# Patient Record
Sex: Male | Born: 1945 | Race: White | Hispanic: No | Marital: Married | Smoking: Former smoker
Health system: Southern US, Community
[De-identification: ages and names within clinical notes are randomized; demographics above are authoritative.]

## PROBLEM LIST (undated history)

## (undated) DIAGNOSIS — K5090 Crohn's disease, unspecified, without complications: Secondary | ICD-10-CM

## (undated) DIAGNOSIS — C61 Malignant neoplasm of prostate: Secondary | ICD-10-CM

## (undated) DIAGNOSIS — E119 Type 2 diabetes mellitus without complications: Secondary | ICD-10-CM

## (undated) DIAGNOSIS — F329 Major depressive disorder, single episode, unspecified: Secondary | ICD-10-CM

## (undated) DIAGNOSIS — H469 Unspecified optic neuritis: Secondary | ICD-10-CM

## (undated) DIAGNOSIS — I10 Essential (primary) hypertension: Secondary | ICD-10-CM

## (undated) DIAGNOSIS — K520 Gastroenteritis and colitis due to radiation: Secondary | ICD-10-CM

## (undated) DIAGNOSIS — F419 Anxiety disorder, unspecified: Secondary | ICD-10-CM

## (undated) DIAGNOSIS — I2510 Atherosclerotic heart disease of native coronary artery without angina pectoris: Secondary | ICD-10-CM

## (undated) DIAGNOSIS — F32A Depression, unspecified: Secondary | ICD-10-CM

## (undated) DIAGNOSIS — G5600 Carpal tunnel syndrome, unspecified upper limb: Secondary | ICD-10-CM

## (undated) DIAGNOSIS — E785 Hyperlipidemia, unspecified: Secondary | ICD-10-CM

## (undated) DIAGNOSIS — N200 Calculus of kidney: Secondary | ICD-10-CM

## (undated) HISTORY — DX: Crohn's disease, unspecified, without complications: K50.90

## (undated) HISTORY — DX: Depression, unspecified: F32.A

## (undated) HISTORY — DX: Unspecified optic neuritis: H46.9

## (undated) HISTORY — DX: Calculus of kidney: N20.0

## (undated) HISTORY — DX: Essential (primary) hypertension: I10

## (undated) HISTORY — DX: Atherosclerotic heart disease of native coronary artery without angina pectoris: I25.10

## (undated) HISTORY — DX: Malignant neoplasm of prostate: C61

## (undated) HISTORY — DX: Carpal tunnel syndrome, unspecified upper limb: G56.00

## (undated) HISTORY — DX: Anxiety disorder, unspecified: F41.9

## (undated) HISTORY — DX: Type 2 diabetes mellitus without complications: E11.9

## (undated) HISTORY — DX: Gastroenteritis and colitis due to radiation: K52.0

## (undated) HISTORY — DX: Hyperlipidemia, unspecified: E78.5

## (undated) HISTORY — PX: KNEE CARTILAGE SURGERY: SHX688

## (undated) HISTORY — PX: NASAL SINUS SURGERY: SHX719

## (undated) HISTORY — PX: COLONOSCOPY: SHX174

## (undated) HISTORY — DX: Major depressive disorder, single episode, unspecified: F32.9

## (undated) HISTORY — PX: PROSTATE SURGERY: SHX751

---

## 1999-12-05 ENCOUNTER — Encounter: Admission: RE | Admit: 1999-12-05 | Discharge: 2000-03-04 | Payer: Self-pay | Admitting: Family Medicine

## 1999-12-27 ENCOUNTER — Encounter: Payer: Self-pay | Admitting: Urology

## 1999-12-27 ENCOUNTER — Ambulatory Visit (HOSPITAL_BASED_OUTPATIENT_CLINIC_OR_DEPARTMENT_OTHER): Admission: RE | Admit: 1999-12-27 | Discharge: 1999-12-27 | Payer: Self-pay | Admitting: Urology

## 2000-05-23 ENCOUNTER — Other Ambulatory Visit: Admission: RE | Admit: 2000-05-23 | Discharge: 2000-05-23 | Payer: Self-pay | Admitting: Internal Medicine

## 2000-05-23 ENCOUNTER — Encounter (INDEPENDENT_AMBULATORY_CARE_PROVIDER_SITE_OTHER): Payer: Self-pay | Admitting: Specialist

## 2002-11-24 ENCOUNTER — Encounter: Admission: RE | Admit: 2002-11-24 | Discharge: 2003-02-22 | Payer: Self-pay | Admitting: Family Medicine

## 2005-03-12 ENCOUNTER — Ambulatory Visit: Payer: Self-pay | Admitting: Internal Medicine

## 2006-03-08 ENCOUNTER — Ambulatory Visit: Payer: Self-pay | Admitting: Internal Medicine

## 2007-03-10 ENCOUNTER — Ambulatory Visit: Payer: Self-pay | Admitting: Internal Medicine

## 2007-04-23 ENCOUNTER — Ambulatory Visit: Payer: Self-pay | Admitting: Internal Medicine

## 2007-05-01 ENCOUNTER — Ambulatory Visit: Payer: Self-pay | Admitting: Internal Medicine

## 2007-06-02 DIAGNOSIS — F419 Anxiety disorder, unspecified: Secondary | ICD-10-CM | POA: Insufficient documentation

## 2007-06-02 DIAGNOSIS — K5730 Diverticulosis of large intestine without perforation or abscess without bleeding: Secondary | ICD-10-CM | POA: Insufficient documentation

## 2007-06-02 DIAGNOSIS — E782 Mixed hyperlipidemia: Secondary | ICD-10-CM | POA: Insufficient documentation

## 2007-06-02 DIAGNOSIS — K5120 Ulcerative (chronic) proctitis without complications: Secondary | ICD-10-CM | POA: Insufficient documentation

## 2007-06-02 DIAGNOSIS — E119 Type 2 diabetes mellitus without complications: Secondary | ICD-10-CM | POA: Insufficient documentation

## 2007-06-02 DIAGNOSIS — I10 Essential (primary) hypertension: Secondary | ICD-10-CM | POA: Insufficient documentation

## 2007-06-02 DIAGNOSIS — K219 Gastro-esophageal reflux disease without esophagitis: Secondary | ICD-10-CM | POA: Insufficient documentation

## 2007-06-02 DIAGNOSIS — K5090 Crohn's disease, unspecified, without complications: Secondary | ICD-10-CM | POA: Insufficient documentation

## 2007-06-02 DIAGNOSIS — F341 Dysthymic disorder: Secondary | ICD-10-CM | POA: Insufficient documentation

## 2007-06-02 DIAGNOSIS — E7800 Pure hypercholesterolemia, unspecified: Secondary | ICD-10-CM

## 2008-04-07 ENCOUNTER — Encounter: Payer: Self-pay | Admitting: Internal Medicine

## 2008-08-05 ENCOUNTER — Encounter (INDEPENDENT_AMBULATORY_CARE_PROVIDER_SITE_OTHER): Payer: Self-pay | Admitting: *Deleted

## 2008-08-09 ENCOUNTER — Ambulatory Visit: Payer: Self-pay | Admitting: Internal Medicine

## 2008-08-09 DIAGNOSIS — K589 Irritable bowel syndrome without diarrhea: Secondary | ICD-10-CM | POA: Insufficient documentation

## 2008-08-09 DIAGNOSIS — R159 Full incontinence of feces: Secondary | ICD-10-CM | POA: Insufficient documentation

## 2008-08-19 ENCOUNTER — Encounter: Payer: Self-pay | Admitting: Internal Medicine

## 2008-09-14 ENCOUNTER — Telehealth: Payer: Self-pay | Admitting: Internal Medicine

## 2008-09-25 ENCOUNTER — Inpatient Hospital Stay (HOSPITAL_COMMUNITY): Admission: EM | Admit: 2008-09-25 | Discharge: 2008-10-01 | Payer: Self-pay | Admitting: Emergency Medicine

## 2008-09-27 ENCOUNTER — Ambulatory Visit: Payer: Self-pay | Admitting: Physical Medicine & Rehabilitation

## 2008-10-01 ENCOUNTER — Inpatient Hospital Stay (HOSPITAL_COMMUNITY)
Admission: RE | Admit: 2008-10-01 | Discharge: 2008-10-22 | Payer: Self-pay | Admitting: Physical Medicine & Rehabilitation

## 2008-10-11 ENCOUNTER — Ambulatory Visit: Payer: Self-pay | Admitting: Psychology

## 2008-10-13 ENCOUNTER — Ambulatory Visit: Payer: Self-pay | Admitting: Physical Medicine & Rehabilitation

## 2008-12-20 ENCOUNTER — Emergency Department (HOSPITAL_COMMUNITY): Admission: EM | Admit: 2008-12-20 | Discharge: 2008-12-20 | Payer: Self-pay | Admitting: Emergency Medicine

## 2009-01-18 ENCOUNTER — Telehealth: Payer: Self-pay | Admitting: Internal Medicine

## 2009-01-20 ENCOUNTER — Telehealth: Payer: Self-pay | Admitting: Internal Medicine

## 2009-01-21 ENCOUNTER — Encounter: Payer: Self-pay | Admitting: Internal Medicine

## 2009-02-15 ENCOUNTER — Telehealth: Payer: Self-pay | Admitting: Internal Medicine

## 2009-05-06 ENCOUNTER — Encounter: Payer: Self-pay | Admitting: Internal Medicine

## 2009-12-08 ENCOUNTER — Encounter: Payer: Self-pay | Admitting: Internal Medicine

## 2010-02-13 ENCOUNTER — Telehealth: Payer: Self-pay | Admitting: Internal Medicine

## 2010-02-14 ENCOUNTER — Encounter: Payer: Self-pay | Admitting: Internal Medicine

## 2010-02-17 ENCOUNTER — Telehealth: Payer: Self-pay | Admitting: Internal Medicine

## 2010-05-25 NOTE — Medication Information (Signed)
Summary: Pt request to change back to [facility]  Pt request to change back to [facility]   Imported By: Sherian Rein 05/10/2009 10:19:47  _____________________________________________________________________  External Attachment:    Type:   Image     Comment:   External Document

## 2010-05-25 NOTE — Progress Notes (Signed)
Summary: meds   Phone Note Call from Patient Call back at Home Phone [phone]   Caller: wife, [name] Call For: Dr. Henrene Pastor Reason for Call: Talk to Nurse Summary of Call: update that pt is inhospitaland wife wanted Pamala Hurry wanted to know so that she knew he wasnt going to be coming by to get meds Initial call taken by: Lucien Mons,  February 17, 2010 3:56 PM  Follow-up for Phone Call        Called wife  and she states he will be in hospital for 8 weeks.  I told her that I would hold on to the samples for her to pick up when she gets a chance.   Follow-up by: Randye Lobo NCMA,  February 17, 2010 4:25 PM

## 2010-05-25 NOTE — Progress Notes (Signed)
Summary: med ?'s   Phone Note Call from Patient Call back at [facility] Phone [phone]   Caller: Patient Call For: Dr. Marina Goodell Reason for Call: Talk to Nurse Summary of Call: has ?'s regarding Asacol Initial call taken by: Vallarie Mare,  February 13, 2010 1:55 PM  Follow-up for Phone Call        wants samples of Asacol HD.  Advised his wife I would leave samples at front desk.  [facility] Cage [facility]  February 14, 2010 8:12 AM

## 2010-05-25 NOTE — Miscellaneous (Signed)
Summary: samples of Asacol  Clinical Lists Changes  Medications: Added new medication of ASACOL HD 800 MG TBEC (MESALAMINE)

## 2010-05-25 NOTE — Medication Information (Signed)
Summary: Prior Authorization for [facility]  Prior Authorization for [facility]   Imported By: Lennie Odor 12/27/2009 15:29:30  _____________________________________________________________________  External Attachment:    Type:   Image     Comment:   External Document

## 2010-06-13 ENCOUNTER — Ambulatory Visit: Payer: Self-pay | Admitting: Urology

## 2010-06-13 ENCOUNTER — Ambulatory Visit (INDEPENDENT_AMBULATORY_CARE_PROVIDER_SITE_OTHER): Payer: Medicare Other | Admitting: Urology

## 2010-06-13 DIAGNOSIS — C61 Malignant neoplasm of prostate: Secondary | ICD-10-CM

## 2010-06-13 DIAGNOSIS — N529 Male erectile dysfunction, unspecified: Secondary | ICD-10-CM

## 2010-07-29 LAB — URINALYSIS, ROUTINE W REFLEX MICROSCOPIC
Bilirubin Urine: NEGATIVE
Hgb urine dipstick: NEGATIVE
Nitrite: NEGATIVE
Protein, ur: NEGATIVE mg/dL
Urobilinogen, UA: 0.2 mg/dL (ref 0.0–1.0)

## 2010-07-29 LAB — APTT: aPTT: 27 seconds (ref 24–37)

## 2010-07-29 LAB — DIFFERENTIAL
Basophils Relative: 1 % (ref 0–1)
Eosinophils Absolute: 0.4 10*3/uL (ref 0.0–0.7)
Lymphs Abs: 1.1 10*3/uL (ref 0.7–4.0)
Monocytes Relative: 6 % (ref 3–12)
Neutro Abs: 5 10*3/uL (ref 1.7–7.7)
Neutrophils Relative %: 72 % (ref 43–77)

## 2010-07-29 LAB — BASIC METABOLIC PANEL
BUN: 15 mg/dL (ref 6–23)
Calcium: 9.3 mg/dL (ref 8.4–10.5)
Chloride: 105 mEq/L (ref 96–112)
Creatinine, Ser: 1.05 mg/dL (ref 0.4–1.5)
GFR calc Af Amer: >60 mL/min (ref >60)

## 2010-07-29 LAB — PROTIME-INR
INR: 1 (ref 0.00–1.49)
Prothrombin Time: 12.8 seconds (ref 11.6–15.2)

## 2010-07-29 LAB — CBC
MCV: 92.4 fL (ref 78.0–100.0)
Platelets: 181 10*3/uL (ref 150–400)
RBC: 3.38 MIL/uL — ABNORMAL LOW (ref 4.22–5.81)
WBC: 6.9 10*3/uL (ref 4.0–10.5)

## 2010-07-30 LAB — GLUCOSE, CAPILLARY
Glucose-Capillary: 106 mg/dL — ABNORMAL HIGH (ref 70–99)
Glucose-Capillary: 114 mg/dL — ABNORMAL HIGH (ref 70–99)
Glucose-Capillary: 98 mg/dL (ref 70–99)

## 2010-07-31 LAB — GLUCOSE, CAPILLARY
Glucose-Capillary: 101 mg/dL — ABNORMAL HIGH (ref 70–99)
Glucose-Capillary: 107 mg/dL — ABNORMAL HIGH (ref 70–99)
Glucose-Capillary: 108 mg/dL — ABNORMAL HIGH (ref 70–99)
Glucose-Capillary: 108 mg/dL — ABNORMAL HIGH (ref 70–99)
Glucose-Capillary: 109 mg/dL — ABNORMAL HIGH (ref 70–99)
Glucose-Capillary: 110 mg/dL — ABNORMAL HIGH (ref 70–99)
Glucose-Capillary: 112 mg/dL — ABNORMAL HIGH (ref 70–99)
Glucose-Capillary: 112 mg/dL — ABNORMAL HIGH (ref 70–99)
Glucose-Capillary: 113 mg/dL — ABNORMAL HIGH (ref 70–99)
Glucose-Capillary: 113 mg/dL — ABNORMAL HIGH (ref 70–99)
Glucose-Capillary: 116 mg/dL — ABNORMAL HIGH (ref 70–99)
Glucose-Capillary: 120 mg/dL — ABNORMAL HIGH (ref 70–99)
Glucose-Capillary: 121 mg/dL — ABNORMAL HIGH (ref 70–99)
Glucose-Capillary: 122 mg/dL — ABNORMAL HIGH (ref 70–99)
Glucose-Capillary: 122 mg/dL — ABNORMAL HIGH (ref 70–99)
Glucose-Capillary: 123 mg/dL — ABNORMAL HIGH (ref 70–99)
Glucose-Capillary: 131 mg/dL — ABNORMAL HIGH (ref 70–99)
Glucose-Capillary: 131 mg/dL — ABNORMAL HIGH (ref 70–99)
Glucose-Capillary: 152 mg/dL — ABNORMAL HIGH (ref 70–99)
Glucose-Capillary: 155 mg/dL — ABNORMAL HIGH (ref 70–99)
Glucose-Capillary: 171 mg/dL — ABNORMAL HIGH (ref 70–99)
Glucose-Capillary: 92 mg/dL (ref 70–99)
Glucose-Capillary: 94 mg/dL (ref 70–99)
Glucose-Capillary: 97 mg/dL (ref 70–99)
Glucose-Capillary: 98 mg/dL (ref 70–99)
Glucose-Capillary: 99 mg/dL (ref 70–99)
Glucose-Capillary: 107 mg/dL — ABNORMAL HIGH (ref 70–99)
Glucose-Capillary: 108 mg/dL — ABNORMAL HIGH (ref 70–99)
Glucose-Capillary: 111 mg/dL — ABNORMAL HIGH (ref 70–99)
Glucose-Capillary: 112 mg/dL — ABNORMAL HIGH (ref 70–99)
Glucose-Capillary: 112 mg/dL — ABNORMAL HIGH (ref 70–99)
Glucose-Capillary: 114 mg/dL — ABNORMAL HIGH (ref 70–99)
Glucose-Capillary: 114 mg/dL — ABNORMAL HIGH (ref 70–99)
Glucose-Capillary: 116 mg/dL — ABNORMAL HIGH (ref 70–99)
Glucose-Capillary: 116 mg/dL — ABNORMAL HIGH (ref 70–99)
Glucose-Capillary: 117 mg/dL — ABNORMAL HIGH (ref 70–99)
Glucose-Capillary: 118 mg/dL — ABNORMAL HIGH (ref 70–99)
Glucose-Capillary: 119 mg/dL — ABNORMAL HIGH (ref 70–99)
Glucose-Capillary: 119 mg/dL — ABNORMAL HIGH (ref 70–99)
Glucose-Capillary: 119 mg/dL — ABNORMAL HIGH (ref 70–99)
Glucose-Capillary: 121 mg/dL — ABNORMAL HIGH (ref 70–99)
Glucose-Capillary: 124 mg/dL — ABNORMAL HIGH (ref 70–99)
Glucose-Capillary: 126 mg/dL — ABNORMAL HIGH (ref 70–99)
Glucose-Capillary: 128 mg/dL — ABNORMAL HIGH (ref 70–99)
Glucose-Capillary: 135 mg/dL — ABNORMAL HIGH (ref 70–99)
Glucose-Capillary: 135 mg/dL — ABNORMAL HIGH (ref 70–99)
Glucose-Capillary: 142 mg/dL — ABNORMAL HIGH (ref 70–99)
Glucose-Capillary: 145 mg/dL — ABNORMAL HIGH (ref 70–99)
Glucose-Capillary: 148 mg/dL — ABNORMAL HIGH (ref 70–99)
Glucose-Capillary: 152 mg/dL — ABNORMAL HIGH (ref 70–99)
Glucose-Capillary: 153 mg/dL — ABNORMAL HIGH (ref 70–99)
Glucose-Capillary: 184 mg/dL — ABNORMAL HIGH (ref 70–99)
Glucose-Capillary: 93 mg/dL (ref 70–99)

## 2010-07-31 LAB — BASIC METABOLIC PANEL
BUN: 25 mg/dL — ABNORMAL HIGH (ref 6–23)
CO2: 29 mEq/L (ref 19–32)
CO2: 31 mEq/L (ref 19–32)
CO2: 26 mEq/L (ref 19–32)
Calcium: 8.3 mg/dL — ABNORMAL LOW (ref 8.4–10.5)
Chloride: 98 mEq/L (ref 96–112)
Chloride: 112 mEq/L (ref 96–112)
Creatinine, Ser: 1.16 mg/dL (ref 0.4–1.5)
Creatinine, Ser: 0.95 mg/dL (ref 0.4–1.5)
Creatinine, Ser: 1.28 mg/dL (ref 0.4–1.5)
GFR calc Af Amer: >60 mL/min (ref >60)
GFR calc Af Amer: >60 mL/min (ref >60)
GFR calc Af Amer: >60 mL/min (ref >60)
GFR calc non Af Amer: 57 mL/min — ABNORMAL LOW (ref >60)
GFR calc non Af Amer: >60 mL/min (ref >60)
Glucose, Bld: 103 mg/dL — ABNORMAL HIGH (ref 70–99)
Glucose, Bld: 115 mg/dL — ABNORMAL HIGH (ref 70–99)
Potassium: 4.3 mEq/L (ref 3.5–5.1)
Potassium: 4.7 mEq/L (ref 3.5–5.1)
Potassium: 4.5 mEq/L (ref 3.5–5.1)
Sodium: 133 mEq/L — ABNORMAL LOW (ref 135–145)
Sodium: 137 mEq/L (ref 135–145)
Sodium: 137 mEq/L (ref 135–145)

## 2010-07-31 LAB — CBC
HCT: 34.6 % — ABNORMAL LOW (ref 39.0–52.0)
HCT: 35.3 % — ABNORMAL LOW (ref 39.0–52.0)
HCT: 20.9 % — ABNORMAL LOW (ref 39.0–52.0)
HCT: 21.9 % — ABNORMAL LOW (ref 39.0–52.0)
HCT: 22.1 % — ABNORMAL LOW (ref 39.0–52.0)
HCT: 25.8 % — ABNORMAL LOW (ref 39.0–52.0)
HCT: 34.1 % — ABNORMAL LOW (ref 39.0–52.0)
HCT: 32 % — ABNORMAL LOW (ref 39.0–52.0)
Hemoglobin: 11.6 g/dL — ABNORMAL LOW (ref 13.0–17.0)
Hemoglobin: 12.1 g/dL — ABNORMAL LOW (ref 13.0–17.0)
Hemoglobin: 7.4 g/dL — CL (ref 13.0–17.0)
Hemoglobin: 7.5 g/dL — CL (ref 13.0–17.0)
Hemoglobin: 8.8 g/dL — ABNORMAL LOW (ref 13.0–17.0)
MCHC: 33.6 g/dL (ref 30.0–36.0)
MCHC: 34.2 g/dL (ref 30.0–36.0)
MCHC: 34.1 g/dL (ref 30.0–36.0)
MCHC: 34.4 g/dL (ref 30.0–36.0)
MCHC: 34.9 g/dL (ref 30.0–36.0)
MCHC: 35.2 g/dL (ref 30.0–36.0)
MCV: 92.7 fL (ref 78.0–100.0)
MCV: 93.5 fL (ref 78.0–100.0)
MCV: 94.3 fL (ref 78.0–100.0)
MCV: 94.6 fL (ref 78.0–100.0)
MCV: 93.7 fL (ref 78.0–100.0)
Platelets: 114 10*3/uL — ABNORMAL LOW (ref 150–400)
Platelets: 143 10*3/uL — ABNORMAL LOW (ref 150–400)
Platelets: 175 10*3/uL (ref 150–400)
Platelets: 262 10*3/uL (ref 150–400)
Platelets: 161 10*3/uL (ref 150–400)
RBC: 3.67 MIL/uL — ABNORMAL LOW (ref 4.22–5.81)
RBC: 2.26 MIL/uL — ABNORMAL LOW (ref 4.22–5.81)
RBC: 2.3 MIL/uL — ABNORMAL LOW (ref 4.22–5.81)
RBC: 2.37 MIL/uL — ABNORMAL LOW (ref 4.22–5.81)
RBC: 2.44 MIL/uL — ABNORMAL LOW (ref 4.22–5.81)
RBC: 2.74 MIL/uL — ABNORMAL LOW (ref 4.22–5.81)
RBC: 3 MIL/uL — ABNORMAL LOW (ref 4.22–5.81)
RDW: 13.9 % (ref 11.5–15.5)
RDW: 14 % (ref 11.5–15.5)
RDW: 14.1 % (ref 11.5–15.5)
RDW: 12.8 % (ref 11.5–15.5)
WBC: 6.9 10*3/uL (ref 4.0–10.5)
WBC: 7 10*3/uL (ref 4.0–10.5)
WBC: 8.6 10*3/uL (ref 4.0–10.5)
WBC: 9.1 10*3/uL (ref 4.0–10.5)
WBC: 13.9 10*3/uL — ABNORMAL HIGH (ref 4.0–10.5)

## 2010-07-31 LAB — POCT CARDIAC MARKERS
CKMB, poc: 1.6 ng/mL (ref 1.0–8.0)
Troponin i, poc: <0.05 ng/mL (ref 0.00–0.09)

## 2010-07-31 LAB — TYPE AND SCREEN
ABO/RH(D): O POS
Antibody Screen: NEGATIVE

## 2010-07-31 LAB — COMPREHENSIVE METABOLIC PANEL
AST: 33 U/L (ref 0–37)
Albumin: 3.4 g/dL — ABNORMAL LOW (ref 3.5–5.2)
Albumin: 3.8 g/dL (ref 3.5–5.2)
BUN: 23 mg/dL (ref 6–23)
BUN: 25 mg/dL — ABNORMAL HIGH (ref 6–23)
Calcium: 9.3 mg/dL (ref 8.4–10.5)
Chloride: 106 mEq/L (ref 96–112)
Creatinine, Ser: 0.89 mg/dL (ref 0.4–1.5)
Creatinine, Ser: 1.14 mg/dL (ref 0.4–1.5)
GFR calc Af Amer: >60 mL/min (ref >60)
Total Bilirubin: 0.5 mg/dL (ref 0.3–1.2)
Total Protein: 7.3 g/dL (ref 6.0–8.3)
Total Protein: 6.7 g/dL (ref 6.0–8.3)

## 2010-07-31 LAB — POCT I-STAT, CHEM 8
Chloride: 108 mEq/L (ref 96–112)
Glucose, Bld: 160 mg/dL — ABNORMAL HIGH (ref 70–99)
HCT: 34 % — ABNORMAL LOW (ref 39.0–52.0)
Hemoglobin: 11.6 g/dL — ABNORMAL LOW (ref 13.0–17.0)
Potassium: 4.3 mEq/L (ref 3.5–5.1)
Sodium: 139 mEq/L (ref 135–145)

## 2010-07-31 LAB — HEMOCCULT GUIAC POC 1CARD (OFFICE): Fecal Occult Bld: NEGATIVE

## 2010-07-31 LAB — CROSSMATCH: ABO/RH(D): O POS

## 2010-07-31 LAB — DIFFERENTIAL
Basophils Absolute: 0 10*3/uL (ref 0.0–0.1)
Eosinophils Relative: 1 % (ref 0–5)
Lymphocytes Relative: 17 % (ref 12–46)
Lymphocytes Relative: 6 % — ABNORMAL LOW (ref 12–46)
Lymphs Abs: 0.9 10*3/uL (ref 0.7–4.0)
Monocytes Absolute: 0.7 10*3/uL (ref 0.1–1.0)
Monocytes Absolute: 0.6 10*3/uL (ref 0.1–1.0)
Monocytes Relative: 8 % (ref 3–12)
Monocytes Relative: 4 % (ref 3–12)
Neutro Abs: 6.3 10*3/uL (ref 1.7–7.7)
Neutro Abs: 12.3 10*3/uL — ABNORMAL HIGH (ref 1.7–7.7)
Neutrophils Relative %: 71 % (ref 43–77)
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
## 2010-09-05 NOTE — Assessment & Plan Note (Signed)
[facility]                         GASTROENTEROLOGY OFFICE NOTE   NAME:[name]                MRN:          [id number]  DATE:03/10/2007                            DOB:          12/25/1945    REASON FOR VISIT:  [name] presents today for followup.  He is a  65 year old gentleman with a history of mild Crohn's colitis with  proctitis, gastroesophageal reflux disease, hypertension, diabetes,  anxiety disorder and radiotherapy for prostate cancer.  For his colitis,  he is maintained on Asacol 1200 mg t.i.d. and mesalamine suppositories  on demand.  For his reflux disease, he has been on omeprazole.  On  omeprazole, his reflux symptoms are well-controlled.  No breakthrough  heartburn or dysphagia.  His chief complaint today is that of  alternating bowel habits.  He reports his bowels alternating between  diarrhea and constipation.  He denies mucus or bleeding.  His weight has  been stable.  There has been occasional nocturnal incontinence.  He  states he is having some short-term memory problems.  He has been  compliant with medical therapy.  He is sees Dr. Wolfgang Phoenix for his general  medical care.  His last colonoscopy was in January 2002.  Followup  colonoscopy letter was sent to his home earlier this year.  He denies  abdominal pain or weight loss.   ALLERGIES:  No known drug allergies.   CURRENT MEDICATIONS:  1. Asacol 1200 mg t.i.d.  2. Paxil 60 mg daily.  3. Simvastatin 80 mg daily.  4. Toprol XL 50 mg daily.  5. Bayer aspirin.  6. Vitamin C.  7. Vitamin E.  8. B12.  9. Metformin 500 mg b.i.d.  10.Xanax 0.5 mg t.i.d.  11.Canasa suppositories 1000 mg at night p.r.n.   PHYSICAL EXAMINATION:  GENERAL:  A well-appearing male in no acute  distress.  VITAL SIGNS:  Blood pressure 122/68, heart rate 60, weight 171.6 pounds  (increased 1.6 pounds).  HEENT:  Sclerae anicteric.  Conjunctivae pink.  Oral mucosa is intact.  There  is no adenopathy.  LUNGS:  Clear.  HEART:  Regular.  ABDOMEN:  Soft without tenderness, mass or hernia.  Good bowel sounds  heard.  EXTREMITIES:  Without edema.   IMPRESSION:  1. Mild Crohn's colitis with proctitis.  2. Alternating bowel habits, question irritable bowel syndrome versus      increased activity of his Crohn's disease.  3. Gastroesophageal reflux disease, stable on omeprazole.   RECOMMENDATIONS:  1. Continue Asacol and mesalamine suppositories.  2. Continue omeprazole.  3. Schedule colonoscopy to assess the status of his colitis.  The      nature of the procedures with risks and alternatives have been      reviewed.  He understood and agreed to proceed.  4. Initiate fiber supplementation in the form of Metamucil in an      effort  to improve bowel habits.     [name]. Paul Pastor, MD  Electronically Signed    JNP/MedQ  DD: 03/10/2007  DT: 03/10/2007  Job #: [id number]   cc:   [name], M.D.
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
## 2010-09-08 NOTE — Assessment & Plan Note (Signed)
[place] OFFICE NOTE   NAME:Leibold, SAMMIE DENNER                MRN:          [id number]  DATE:03/08/2006                            DOB:          [birth year]    HISTORY:  Mr. Agner presents for annual followup.  He requests  medication refill.  He is a 65 year old gentleman (birthday is today) with a  history of mild Crohn's colitis with proctitis.  As well, he has a history  of gastroesophageal reflux disease.  General medical problems include  hypertension, diabetes and anxiety.  He was last evaluated March 12, 2005.  At that time, his gastrointestinal problems were well-managed on  medical therapy.  For his inflammatory bowel disease he takes Asacol 1200 mg  t.i.d. and mesalamine suppositories on demand.  For his reflux disease he  was on Protonix but recently switched to what I suspect is generic  omeprazole by the [facility].  Since his last visit he is doing well.  He  does request a refill of mesalamine suppositories and possibly Asacol as he  is not sure if he can get these medications through the [place].  On his new  proton pump inhibitor he has good control of his reflux symptoms.  He has  been complaining of increased flatus, which he thinks maybe due to change of  medicines, though he is not sure.  He denies abdominal pain, bleeding or  incontinence as previous.   CURRENT MEDICATIONS:  Are as listed:  1. Asacol 1200 mg t.i.d.  2. Imdur 60 mg daily.  3. Toprol XL 75 mg daily.  4. Aspirin.  5. Vitamin C.  6. Vitamin E.  7. Vitamin B12.  8. Lipitor 80 mg daily.  9. Metformin 500 mg b.i.d.  10.Oxytrol patch.  11.Paxil 60 mg daily.  12.Xanax 0.5 mg t.i.d.  13.Mesalamine suppositories.  14.Flomax.   PHYSICAL EXAMINATION:  A well-appearing male in no acute distress.  He is  alert and oriented.  Blood pressure is 120/70, heart rate is 48 and regular.  His weight is 170  pounds.  HEENT:  Sclerae anicteric, conjunctivae are pink, oral mucosa is intact.  ABDOMEN:  Soft, without tenderness, mass or hernia.  Good bowel sounds  heard.   IMPRESSION:  1. Mild Crohn's colitis with proctitis.  Symptoms continue to be managed      with daily Asacol and mesalamine suppositories on demand.  2. Gastroesophageal reflux disease.  Symptoms controlled with current      proton pump inhibitor.  3. General medical problems.   RECOMMENDATIONS:  1. Continue Asacol.  2. Continue mesalamine suppositories.  3. Continue proton pump inhibitor.  4. Ongoing general medical care with Dr. Wolfgang Phoenix.  5. Gastrointestinal follow up in 1 year unless interval questions or      problems.     Docia Chuck. Geri Seminole., MD  Electronically Signed    JNP/MedQ  DD: 03/08/2006  DT: 03/08/2006  Job #: [id number]   cc:   Margaretmary Eddy, M.D.

## 2010-09-08 NOTE — Op Note (Signed)
[place]  Patient:    [name], [name]                MRN: [id number] Proc. Date: 12/27/99 Adm. Date:  [id number] Attending:  Paschal Dopp CC:         Calton Golds, M.D.  Rexene Edison, M.D.   Operative Report  PREOPERATIVE DIAGNOSIS:  Adenocarcinoma of the prostate, clinical stage T1C, Gleason score 6/10 (3+3).  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  OPERATION PERFORMED:  Placement of I-125 seeds within the prostate.  SURGEON:  Lillette Boxer. Diona Fanti, M.D.  ASSISTANT:  Rexene Edison, M.D.  ANESTHESIA:  General.  COMPLICATIONS:  None.  INDICATIONS FOR PROCEDURE:  The patient is a 65 year old male with a history of prostate cancer, diagnosed by Dr. Ranae Plumber in [place].  The patient presented for evaluation of an elevated PSA.  Pathologic review of the specimen has revealed a Gleason score 6/10 in the left lobe.  The patient has chosen to have radiation therapy, and he and Dr. Valere Dross have decided on brachytherapy. He is aware of alternatives as well as the risks of this procedure.  He desires to proceed.  DESCRIPTION OF PROCEDURE:  The patient was administered a general anesthetic. He was placed in a dorsal lithotomy position, genitalia and perineum were prepped and draped after a Foley catheter was placed transurethrally.  The ultrasound probe was placed in the rectum which had been decompressed with a red rubber catheter.  The ultrasound probe was used to visualize the prostate and it was fixed in position to match the previously obtained ultrasonographs. The prostate measured approximately 4 cm in length.  The point of reference was 1 cm from the base.  Using the piont of reference and after all measurements had been taken, a total of 104 seeds were placed using 25 needles.  Fluoroscopy and ultrasonograms were used to guide placement. Placement was  found to be excellent.  There was a string of seeds on the right which were felt to be dislodged into the perineum/ischiorectal fossa which was a fatty area.  We could not recover these.  Following placement of all of these seeds, cystoscopy was performed.  No seeds were seen loose in the bladder or urethra.  At this point the catheter was replaced and the patient extubated and taken to PACU in stable condition. DD:  12/27/99 TD:  12/28/99 Job: [id number]

## 2011-05-01 ENCOUNTER — Ambulatory Visit (INDEPENDENT_AMBULATORY_CARE_PROVIDER_SITE_OTHER): Payer: Medicare Other | Admitting: Urology

## 2011-05-01 DIAGNOSIS — N320 Bladder-neck obstruction: Secondary | ICD-10-CM

## 2011-05-01 DIAGNOSIS — N529 Male erectile dysfunction, unspecified: Secondary | ICD-10-CM

## 2011-05-01 DIAGNOSIS — C61 Malignant neoplasm of prostate: Secondary | ICD-10-CM

## 2011-08-07 ENCOUNTER — Ambulatory Visit (INDEPENDENT_AMBULATORY_CARE_PROVIDER_SITE_OTHER): Payer: Medicare Other | Admitting: Urology

## 2011-08-07 DIAGNOSIS — C61 Malignant neoplasm of prostate: Secondary | ICD-10-CM

## 2011-12-06 DIAGNOSIS — H47019 Ischemic optic neuropathy, unspecified eye: Secondary | ICD-10-CM | POA: Insufficient documentation

## 2011-12-06 DIAGNOSIS — H4720 Unspecified optic atrophy: Secondary | ICD-10-CM | POA: Insufficient documentation

## 2011-12-06 DIAGNOSIS — H47013 Ischemic optic neuropathy, bilateral: Secondary | ICD-10-CM | POA: Insufficient documentation

## 2011-12-06 DIAGNOSIS — H469 Unspecified optic neuritis: Secondary | ICD-10-CM | POA: Insufficient documentation

## 2011-12-06 DIAGNOSIS — H53439 Sector or arcuate defects, unspecified eye: Secondary | ICD-10-CM | POA: Insufficient documentation

## 2012-07-23 ENCOUNTER — Encounter: Payer: Self-pay | Admitting: *Deleted

## 2012-07-28 ENCOUNTER — Ambulatory Visit: Payer: Medicare Other | Admitting: Family Medicine

## 2012-08-05 ENCOUNTER — Ambulatory Visit: Payer: Medicare Other | Admitting: Urology

## 2012-08-05 ENCOUNTER — Ambulatory Visit: Payer: Medicare Other | Admitting: Family Medicine

## 2012-09-02 ENCOUNTER — Ambulatory Visit (INDEPENDENT_AMBULATORY_CARE_PROVIDER_SITE_OTHER): Payer: Medicare Other | Admitting: Urology

## 2012-09-02 DIAGNOSIS — C61 Malignant neoplasm of prostate: Secondary | ICD-10-CM

## 2012-09-17 ENCOUNTER — Encounter: Payer: Self-pay | Admitting: Family Medicine

## 2012-09-17 ENCOUNTER — Ambulatory Visit (INDEPENDENT_AMBULATORY_CARE_PROVIDER_SITE_OTHER): Payer: Medicare Other | Admitting: Family Medicine

## 2012-09-17 VITALS — BP 126/74 | Temp 98.5°F | Wt 165.0 lb

## 2012-09-17 DIAGNOSIS — R109 Unspecified abdominal pain: Secondary | ICD-10-CM

## 2012-09-17 DIAGNOSIS — E785 Hyperlipidemia, unspecified: Secondary | ICD-10-CM

## 2012-09-17 DIAGNOSIS — K5730 Diverticulosis of large intestine without perforation or abscess without bleeding: Secondary | ICD-10-CM

## 2012-09-17 DIAGNOSIS — I10 Essential (primary) hypertension: Secondary | ICD-10-CM

## 2012-09-17 NOTE — Progress Notes (Signed)
  Subjective:    Patient ID: [name], male    DOB: 1945/06/30, 67 y.o.   MRN: [id number]  Abdominal Pain This is a new problem. The current episode started in the past 7 days. The onset quality is gradual. The problem occurs 2 to 4 times per day. The pain is located in the epigastric region. The pain is moderate. The abdominal pain radiates to the epigastric region. Nothing aggravates the pain. The pain is relieved by nothing. He has tried nothing for the symptoms. The treatment provided mild relief.   No one else has been sick at home. Early on vomiting was much more frequent. Now it's more loose stools. Patient has had mid epigastric discomfort. Often nausea. At times notes his reflux is been acting up in recent months.  Of note, the patient did not followup. Is requested for his primary care. He and his wife today now states that his primary care doctor is the [facility]. They plan to use [place] only for short term issues such as this.  Review of Systems  Gastrointestinal: Positive for abdominal pain.      ROS otherwise negative. Objective:   Physical Exam Alert no acute distress somewhat anxious. Vitals reviewed. Afebrile. Lungs clear. Heart regular rate and rhythm. Epigastrium mild to moderate tenderness. No rebound no guarding bowel sounds diffusely hyperactive.       Assessment & Plan:  Impression #1 acute gastroenteritis. #2 flare of reflux. #3 probable gastritis. Plan increase omeprazole to 40 mg daily. Carafate 1 g a.c. and at bedtime. Zofran 4 mg ODT when necessary for nausea. Expect gradual resolution. Followup with [place] or his primary care doctor at the [place] if persists. Easily 25 minutes spent most in discussion. WSL

## 2012-09-17 NOTE — Patient Instructions (Signed)
Try to get new dose of omeprazole approved by your primary care doctor at the [facility]

## 2012-09-24 ENCOUNTER — Telehealth: Payer: Self-pay | Admitting: Family Medicine

## 2012-09-24 NOTE — Telephone Encounter (Signed)
Patient says that he has applied for a back brace and wants to give you the heads up for when the paper is faxed over from the company and to please approve it for him. Thanks.

## 2012-09-29 ENCOUNTER — Telehealth: Payer: Self-pay | Admitting: Family Medicine

## 2012-09-29 NOTE — Telephone Encounter (Signed)
Patient Prior Auth for Ondansetron has been approved and a letter will be sent out soon. Any questions call [phone]

## 2012-10-21 LAB — PULMONARY FUNCTION TEST

## 2013-03-03 ENCOUNTER — Telehealth: Payer: Self-pay | Admitting: Family Medicine

## 2013-03-03 NOTE — Telephone Encounter (Signed)
Wants to know if Dr. Richardson Landry approved a back brace and diabetic supplies for patient.  States he does not remember whom was sending our office the forms.

## 2013-03-04 NOTE — Telephone Encounter (Signed)
Don't recall getting forms

## 2013-03-04 NOTE — Telephone Encounter (Signed)
Patient advised to have forms resent for evaluation.

## 2013-03-18 ENCOUNTER — Ambulatory Visit (INDEPENDENT_AMBULATORY_CARE_PROVIDER_SITE_OTHER): Payer: Medicare Other | Admitting: Cardiovascular Disease

## 2013-03-18 ENCOUNTER — Encounter: Payer: Self-pay | Admitting: Cardiovascular Disease

## 2013-03-18 VITALS — BP 102/60 | HR 69 | Ht 71.0 in | Wt 167.7 lb

## 2013-03-18 DIAGNOSIS — I2510 Atherosclerotic heart disease of native coronary artery without angina pectoris: Secondary | ICD-10-CM

## 2013-03-18 DIAGNOSIS — E119 Type 2 diabetes mellitus without complications: Secondary | ICD-10-CM

## 2013-03-18 DIAGNOSIS — E785 Hyperlipidemia, unspecified: Secondary | ICD-10-CM

## 2013-03-18 DIAGNOSIS — I10 Essential (primary) hypertension: Secondary | ICD-10-CM

## 2013-03-18 MED ORDER — NITROGLYCERIN 0.4 MG SL SUBL: 0.4000 mg | SUBLINGUAL_TABLET | SUBLINGUAL | Status: DC | PRN

## 2013-03-18 NOTE — Patient Instructions (Signed)
Start taking the hydrochlorothiazide only three days a week (Monday, Wednesday and Friday), 12.5 mg each time. Monitor your BP for 2-3 weeks. If your blood pressure stays under 140/90, you can stop hydrochlorothiazide altogether.  Your physician recommends that you schedule a follow-up appointment in: 12 months

## 2013-03-21 DIAGNOSIS — I2510 Atherosclerotic heart disease of native coronary artery without angina pectoris: Secondary | ICD-10-CM | POA: Insufficient documentation

## 2013-03-21 NOTE — Progress Notes (Signed)
Patient ID: [name], male   DOB: [birth year], 67 y.o.   MRN: [id number]     Reason for office visit CAD   Allergies  Allergen Reactions  . Flomax [Tamsulosin Hcl]     High dose   . Morphine And Related     Current Outpatient Prescriptions  Medication Sig Dispense Refill  . albuterol (PROVENTIL HFA;VENTOLIN HFA) 108 (90 BASE) MCG/ACT inhaler Inhale 2 puffs into the lungs every 6 (six) hours as needed for wheezing.      [name] ascorbic acid (VITAMIN C) 500 MG tablet Take 500 mg by mouth 2 (two) times daily.      [name] aspirin 325 MG EC tablet Take 325 mg by mouth daily.      . clotrimazole (LOTRIMIN) 1 % cream Apply 1 application topically 2 (two) times daily.      [name] docusate sodium (COLACE) 100 MG capsule Take 100 mg by mouth 2 (two) times daily.      . flunisolide (NASAREL) 29 MCG/ACT (0.025%) nasal spray Place into the nose. 1 spray in each nostril at bedtime      . hydrochlorothiazide (HYDRODIURIL) 25 MG tablet Take 25 mg by mouth. 1/2 tablet daily      . lisinopril (PRINIVIL,ZESTRIL) 20 MG tablet Take 20 mg by mouth. 1/2 tablet daily      . mesalamine (ASACOL) 400 MG EC tablet Take 400 mg by mouth 3 (three) times daily.       . metoprolol tartrate (LOPRESSOR) 25 MG tablet Take by mouth. Take 1/2 tablet BID      . nitroGLYCERIN (NITROSTAT) 0.4 MG SL tablet Place 1 tablet (0.4 mg total) under the tongue every 5 (five) minutes as needed.  25 tablet  12  . omeprazole (PRILOSEC) 20 MG capsule Take 20 mg by mouth daily.      [name] PARoxetine (PAXIL) 40 MG tablet Take 40 mg by mouth. Take 1 1/2 tablets at bedtime      . QUEtiapine (SEROQUEL XR) 50 MG TB24 Take 50 mg by mouth. Take 1/2 tablet at bedtime      . rOPINIRole (REQUIP) 1 MG tablet Take 1 mg by mouth at bedtime.      . simvastatin (ZOCOR) 80 MG tablet Take by mouth at bedtime. One half tablet      . traZODone (DESYREL) 50 MG tablet Take by mouth at bedtime. 2 tablets      . vitamin E 400 UNIT capsule Take 400 Units by mouth  daily.       No current facility-administered medications for this visit.    Past Medical History  Diagnosis Date  . Anxiety   . Hyperlipidemia   . Depression   . Hypertension   . Diabetes mellitus without complication     Type 2  . Kidney stone   . CAD (coronary artery disease)   . CTS (carpal tunnel syndrome)     Mild  . Adenocarcinoma of prostate   . Crohn's disease   . Optic neuritis     Past Surgical History  Procedure Laterality Date  . Knee cartilage surgery Right   . Colonoscopy    . Nasal sinus surgery    . Prostate surgery      Family History  Problem Relation Age of Onset  . Cancer Mother     Colon  . Hypertension Mother   . Heart attack Father   . Coronary artery disease Father   . Hypertension Sister   . Crohn's disease  Brother     History   Social History  . Marital Status: Married    Spouse Name: N/A    Number of Children: N/A  . Years of Education: N/A   Occupational History  . Not on file.   Social History Main Topics  . Smoking status: Current Every Day Smoker  . Smokeless tobacco: Not on file     Comment: havent been able to smoke since Monday 09/15/12  . Alcohol Use: Not on file  . Drug Use: Not on file  . Sexual Activity: Not on file   Other Topics Concern  . Not on file   Social History Narrative  . No narrative on file    Review of systems: The patient specifically denies any chest pain at rest or with exertion, dyspnea at rest or with exertion, orthopnea, paroxysmal nocturnal dyspnea, syncope, palpitations, focal neurological deficits, intermittent claudication, lower extremity edema, unexplained weight gain, cough, hemoptysis or wheezing.  The patient also denies abdominal pain, nausea, vomiting, dysphagia, diarrhea, constipation, polyuria, polydipsia, dysuria, hematuria, frequency, urgency, abnormal bleeding or bruising, fever, chills, unexpected weight changes, mood swings, change in skin or hair texture, change in voice  quality, auditory or visual problems, allergic reactions or rashes, new musculoskeletal complaints other than usual "aches and pains".   PHYSICAL EXAM BP 102/60  Pulse 69  Ht 5' 11"  (1.803 m)  Wt 167 lb 11.2 oz (76.068 kg)  BMI 23.40 kg/m2  General: Alert, oriented x3, no distress Head: no evidence of trauma, PERRL, EOMI, no exophtalmos or lid lag, no myxedema, no xanthelasma; normal ears, nose and oropharynx Neck: normal jugular venous pulsations and no hepatojugular reflux; brisk carotid pulses without delay and no carotid bruits Chest: clear to auscultation, no signs of consolidation by percussion or palpation, normal fremitus, symmetrical and full respiratory excursions Cardiovascular: normal position and quality of the apical impulse, regular rhythm, normal first and second heart sounds, no murmurs, rubs or gallops Abdomen: no tenderness or distention, no masses by palpation, no abnormal pulsatility or arterial bruits, normal bowel sounds, no hepatosplenomegaly Extremities: no clubbing, cyanosis or edema; 2+ radial, ulnar and brachial pulses bilaterally; 2+ right femoral, posterior tibial and dorsalis pedis pulses; 2+ left femoral, posterior tibial and dorsalis pedis pulses; no subclavian or femoral bruits Neurological: grossly nonfocal   EKG: NSR, 1st degree AV block   ASSESSMENT AND PLAN CAD s/p PCI LAD  Asymptomatic, although activity is limited by back problems, which limits functional assessment. Focus is on risk factors.  DIABETES MELLITUS Reports good control, A1c around 6%  HYPERLIPIDEMIA On statin therapy, reports labs by dr. Wolfgang Phoenix showed good results  HYPERTENSION BP is low and he has symptoms of orthostatic hypotension. Will try to wean off diuretic altogether, avoiding the negative metabolic effects.  Patient Instructions  Start taking the hydrochlorothiazide only three days a week (Monday, Wednesday and Friday), 12.5 mg each time. Monitor your BP for 2-3  weeks. If your blood pressure stays under 140/90, you can stop hydrochlorothiazide altogether.  Your physician recommends that you schedule a follow-up appointment in: 12 months    Orders Placed This Encounter  Procedures  . EKG 12-Lead   Meds ordered this encounter  Medications  . rOPINIRole (REQUIP) 1 MG tablet    Sig: Take 1 mg by mouth at bedtime.  [name] DISCONTD: nitroGLYCERIN (NITROSTAT) 0.4 MG SL tablet    Sig: Place 1 tablet (0.4 mg total) under the tongue every 5 (five) minutes as needed.    Dispense:  25 tablet    Refill:  12  . nitroGLYCERIN (NITROSTAT) 0.4 MG SL tablet    Sig: Place 1 tablet (0.4 mg total) under the tongue every 5 (five) minutes as needed.    Dispense:  25 tablet    Refill:  [address] [place], [facility] [phone] office [phone] pager

## 2013-03-21 NOTE — Assessment & Plan Note (Signed)
BP is low and he has symptoms of orthostatic hypotension. Will try to wean off diuretic altogether, avoiding the negative metabolic effects.

## 2013-03-21 NOTE — Assessment & Plan Note (Signed)
Reports good control, A1c around 6%

## 2013-03-21 NOTE — Assessment & Plan Note (Signed)
On statin therapy, reports labs by dr. Wolfgang Phoenix showed good results

## 2013-03-21 NOTE — Assessment & Plan Note (Signed)
Asymptomatic, although activity is limited by back problems, which limits functional assessment. Focus is on risk factors.

## 2013-03-23 ENCOUNTER — Encounter: Payer: Self-pay | Admitting: Cardiovascular Disease

## 2013-09-08 ENCOUNTER — Ambulatory Visit (INDEPENDENT_AMBULATORY_CARE_PROVIDER_SITE_OTHER): Payer: Commercial Managed Care - HMO | Admitting: Urology

## 2013-09-08 DIAGNOSIS — C61 Malignant neoplasm of prostate: Secondary | ICD-10-CM

## 2014-10-19 ENCOUNTER — Encounter: Payer: Self-pay | Admitting: Internal Medicine

## 2014-11-10 ENCOUNTER — Encounter: Payer: Self-pay | Admitting: Cardiovascular Disease

## 2015-01-18 ENCOUNTER — Ambulatory Visit (INDEPENDENT_AMBULATORY_CARE_PROVIDER_SITE_OTHER): Payer: Commercial Managed Care - HMO | Admitting: Urology

## 2015-01-18 DIAGNOSIS — C61 Malignant neoplasm of prostate: Secondary | ICD-10-CM | POA: Diagnosis not present

## 2015-01-18 DIAGNOSIS — N5201 Erectile dysfunction due to arterial insufficiency: Secondary | ICD-10-CM | POA: Diagnosis not present

## 2015-06-13 DIAGNOSIS — H2513 Age-related nuclear cataract, bilateral: Secondary | ICD-10-CM | POA: Diagnosis not present

## 2015-06-13 DIAGNOSIS — H53433 Sector or arcuate defects, bilateral: Secondary | ICD-10-CM | POA: Diagnosis not present

## 2015-06-13 DIAGNOSIS — E119 Type 2 diabetes mellitus without complications: Secondary | ICD-10-CM | POA: Diagnosis not present

## 2015-06-13 DIAGNOSIS — H47013 Ischemic optic neuropathy, bilateral: Secondary | ICD-10-CM | POA: Diagnosis not present

## 2015-06-13 DIAGNOSIS — I10 Essential (primary) hypertension: Secondary | ICD-10-CM | POA: Diagnosis not present

## 2016-04-10 ENCOUNTER — Ambulatory Visit (INDEPENDENT_AMBULATORY_CARE_PROVIDER_SITE_OTHER): Payer: Commercial Managed Care - HMO | Admitting: Nurse Practitioner

## 2016-04-10 ENCOUNTER — Encounter: Payer: Self-pay | Admitting: Nurse Practitioner

## 2016-04-10 VITALS — BP 130/72 | Temp 98.0°F | Wt 170.2 lb

## 2016-04-10 DIAGNOSIS — J329 Chronic sinusitis, unspecified: Secondary | ICD-10-CM

## 2016-04-10 MED ORDER — AMOXICILLIN-POT CLAVULANATE 875-125 MG PO TABS
1.0000 | ORAL_TABLET | Freq: Two times a day (BID) | ORAL | 0 refills | Status: DC
Start: 2016-04-10 — End: 2016-06-05

## 2016-04-12 ENCOUNTER — Encounter: Payer: Self-pay | Admitting: Nurse Practitioner

## 2016-04-12 NOTE — Progress Notes (Signed)
Subjective:  Presents for complaints of off-and-on sinus symptoms that began around Thanksgiving. Sore throat 2 days. Had a fever initially although this has resolved. Occasional cough, slightly productive. Occasional hoarseness. Sinus drainage. Has been using his nasal spray. No wheezing. No headache.  Objective:   BP 130/72   Temp 98 F (36.7 C) (Oral)   Wt 170 lb 4 oz (77.2 kg)   BMI 23.75 kg/m  NAD. Alert, oriented. TMs retracted, no erythema. Pharynx injected with green PND noted. Neck supple with mild soft anterior adenopathy. Lungs clear. Heart regular rate rhythm.  Assessment: Rhinosinusitis  Plan:  Meds ordered this encounter  Medications  . amoxicillin-clavulanate (AUGMENTIN) 875-125 MG tablet    Sig: Take 1 tablet by mouth 2 (two) times daily.    Dispense:  20 tablet    Refill:  0    Order Specific Question:   Supervising Provider    Answer:   [name] [[id number]]   OTC meds as directed for congestion and cough. Call back if worsens or persists. Discussed importance of smoking cessation.

## 2016-05-26 DIAGNOSIS — R0682 Tachypnea, not elsewhere classified: Secondary | ICD-10-CM | POA: Diagnosis not present

## 2016-05-26 DIAGNOSIS — J449 Chronic obstructive pulmonary disease, unspecified: Secondary | ICD-10-CM | POA: Diagnosis not present

## 2016-05-26 DIAGNOSIS — F419 Anxiety disorder, unspecified: Secondary | ICD-10-CM | POA: Diagnosis not present

## 2016-05-26 DIAGNOSIS — F329 Major depressive disorder, single episode, unspecified: Secondary | ICD-10-CM | POA: Diagnosis not present

## 2016-05-26 DIAGNOSIS — I252 Old myocardial infarction: Secondary | ICD-10-CM | POA: Diagnosis not present

## 2016-05-26 DIAGNOSIS — R079 Chest pain, unspecified: Secondary | ICD-10-CM | POA: Diagnosis not present

## 2016-05-26 DIAGNOSIS — K5900 Constipation, unspecified: Secondary | ICD-10-CM | POA: Diagnosis not present

## 2016-05-26 DIAGNOSIS — R339 Retention of urine, unspecified: Secondary | ICD-10-CM | POA: Diagnosis not present

## 2016-05-26 DIAGNOSIS — R0602 Shortness of breath: Secondary | ICD-10-CM | POA: Diagnosis not present

## 2016-05-26 DIAGNOSIS — I482 Chronic atrial fibrillation: Secondary | ICD-10-CM | POA: Diagnosis not present

## 2016-05-26 DIAGNOSIS — I10 Essential (primary) hypertension: Secondary | ICD-10-CM | POA: Diagnosis not present

## 2016-06-04 ENCOUNTER — Ambulatory Visit: Payer: Commercial Managed Care - HMO | Admitting: Family Medicine

## 2016-06-05 ENCOUNTER — Encounter: Payer: Self-pay | Admitting: Family Medicine

## 2016-06-05 ENCOUNTER — Ambulatory Visit (INDEPENDENT_AMBULATORY_CARE_PROVIDER_SITE_OTHER): Payer: Medicare HMO | Admitting: Family Medicine

## 2016-06-05 VITALS — BP 112/68 | Temp 98.5°F | Ht 71.0 in | Wt 156.0 lb

## 2016-06-05 DIAGNOSIS — Z79899 Other long term (current) drug therapy: Secondary | ICD-10-CM | POA: Diagnosis not present

## 2016-06-05 DIAGNOSIS — I10 Essential (primary) hypertension: Secondary | ICD-10-CM | POA: Diagnosis not present

## 2016-06-05 DIAGNOSIS — I2510 Atherosclerotic heart disease of native coronary artery without angina pectoris: Secondary | ICD-10-CM | POA: Diagnosis not present

## 2016-06-05 DIAGNOSIS — K50119 Crohn's disease of large intestine with unspecified complications: Secondary | ICD-10-CM | POA: Diagnosis not present

## 2016-06-05 DIAGNOSIS — K51219 Ulcerative (chronic) proctitis with unspecified complications: Secondary | ICD-10-CM

## 2016-06-05 DIAGNOSIS — C61 Malignant neoplasm of prostate: Secondary | ICD-10-CM | POA: Diagnosis not present

## 2016-06-05 DIAGNOSIS — R0600 Dyspnea, unspecified: Secondary | ICD-10-CM | POA: Diagnosis not present

## 2016-06-05 DIAGNOSIS — Z125 Encounter for screening for malignant neoplasm of prostate: Secondary | ICD-10-CM

## 2016-06-05 DIAGNOSIS — I2584 Coronary atherosclerosis due to calcified coronary lesion: Secondary | ICD-10-CM

## 2016-06-05 DIAGNOSIS — E785 Hyperlipidemia, unspecified: Secondary | ICD-10-CM | POA: Diagnosis not present

## 2016-06-05 MED ORDER — LEVOFLOXACIN 500 MG PO TABS: 500.0000 mg | ORAL_TABLET | Freq: Every day | ORAL | 0 refills | Status: DC

## 2016-06-05 NOTE — Progress Notes (Signed)
   Subjective:    Patient ID: [name], male    DOB: 1946-01-19, 71 y.o.   MRN: [id number]  Sinusitis  This is a new problem. Episode onset: 3 months ago. Associated symptoms include congestion, coughing, headaches and a sore throat. (Weakness) Treatments tried: antibiotic.  shortness of breath. 02 today 96%. Coughing up stuff Not eating. Has lost weight.  Had sinusits ack in December  Cough and congestion   Couple weeks ago developed mental status challenges, was placed in lock down for awhile. Was apparently in a mental health unit. Has follow-up. Not thrilled about it but he is going to attend.   Discuss lorazepam. Not helping.   Needs referrals to cardiology, gi, and referral for prostate. On further history, which is somewhat difficult, to obtain, patient reports she is seen no specialist for several years. This despite the fact he has known Crohn's disease, known coronary artery disease with history of stenting, and known prostate cancer. Patient somewhat vague and seems to blame it all on the [place] for lack of is appropriate specialists follow-up for all of his general concerns   Review of Systems  HENT: Positive for congestion and sore throat.   Respiratory: Positive for cough.   Neurological: Positive for headaches.       Objective:   Physical Exam  Alert pleasant no acute distress H&T some nasal congestion pharynx normal lungs no crackles or wheezes occasional bronchial cough heart regular rate and rhythm.      Assessment & Plan:  Impression subacute rhinosinusitis/bronchitis with chronic cough and strong smoking history #2 hyperlipidemia current status uncertain claims compliance with meds #3 coronary artery disease claims is not seen a specialist for years #4 prostate cancer claims he has not seen a specialist for years #4 Crohn's disease/proctitis also states has not seen a specialist for years. #5 new medications patient claims on all new medicines but did  not bring a list today #6 mental health issues. I think is substantial aspect of patient's challenges to follow-up with specialist frankly. I was very clear to patient and his wife that he absolute has to work with his mental health specialist an ongoing fashion and we cannot assume care of this and all. Chest x-ray. Appropriate blood work. Multiple referrals. Easily 45 minutes spent in the room with family and patient trying to cover 3 years worth of issues which have gone not managed or minimally greater than 50% of the time spent in discussion and counseling importance of compliance and follow-up with all these referrals accentuated

## 2016-06-06 DIAGNOSIS — C61 Malignant neoplasm of prostate: Secondary | ICD-10-CM | POA: Insufficient documentation

## 2016-06-07 ENCOUNTER — Ambulatory Visit (HOSPITAL_COMMUNITY)
Admission: RE | Admit: 2016-06-07 | Discharge: 2016-06-07 | Disposition: A | Discharge status: Home / Self Care | Payer: Commercial Managed Care - HMO | Source: Ambulatory Visit | Attending: Family Medicine | Admitting: Family Medicine

## 2016-06-07 DIAGNOSIS — R0600 Dyspnea, unspecified: Secondary | ICD-10-CM | POA: Diagnosis present

## 2016-06-07 DIAGNOSIS — R918 Other nonspecific abnormal finding of lung field: Secondary | ICD-10-CM | POA: Insufficient documentation

## 2016-06-07 DIAGNOSIS — R0602 Shortness of breath: Secondary | ICD-10-CM | POA: Diagnosis not present

## 2016-06-07 DIAGNOSIS — R05 Cough: Secondary | ICD-10-CM | POA: Diagnosis not present

## 2016-06-08 ENCOUNTER — Encounter: Payer: Self-pay | Admitting: Family Medicine

## 2016-06-08 DIAGNOSIS — Z125 Encounter for screening for malignant neoplasm of prostate: Secondary | ICD-10-CM | POA: Diagnosis not present

## 2016-06-08 DIAGNOSIS — I10 Essential (primary) hypertension: Secondary | ICD-10-CM | POA: Diagnosis not present

## 2016-06-08 DIAGNOSIS — E785 Hyperlipidemia, unspecified: Secondary | ICD-10-CM | POA: Diagnosis not present

## 2016-06-08 DIAGNOSIS — Z79899 Other long term (current) drug therapy: Secondary | ICD-10-CM | POA: Diagnosis not present

## 2016-06-09 LAB — LIPID PANEL
CHOL/HDL RATIO: 3.2 ratio (ref 0.0–5.0)
Cholesterol, Total: 148 mg/dL (ref 100–199)
HDL: 46 mg/dL (ref >39)
LDL CALC: 83 mg/dL (ref 0–99)
Triglycerides: 94 mg/dL (ref 0–149)
VLDL Cholesterol Cal: 19 mg/dL (ref 5–40)

## 2016-06-09 LAB — BASIC METABOLIC PANEL
BUN/Creatinine Ratio: 22 (ref 10–24)
BUN: 40 mg/dL — AB (ref 8–27)
CALCIUM: 9.4 mg/dL (ref 8.6–10.2)
CO2: 21 mmol/L (ref 18–29)
CREATININE: 1.83 mg/dL — AB (ref 0.76–1.27)
Chloride: 97 mmol/L (ref 96–106)
GFR calc non Af Amer: 37 mL/min/{1.73_m2} — ABNORMAL LOW (ref >59)
GFR, EST AFRICAN AMERICAN: 42 mL/min/{1.73_m2} — AB (ref >59)
GLUCOSE: 128 mg/dL — AB (ref 65–99)
Potassium: 3.6 mmol/L (ref 3.5–5.2)
Sodium: 138 mmol/L (ref 134–144)

## 2016-06-09 LAB — HEPATIC FUNCTION PANEL
ALT: 93 IU/L — ABNORMAL HIGH (ref 0–44)
AST: 68 IU/L — ABNORMAL HIGH (ref 0–40)
Albumin: 3.7 g/dL (ref 3.5–4.8)
Alkaline Phosphatase: 150 IU/L — ABNORMAL HIGH (ref 39–117)
BILIRUBIN TOTAL: 0.6 mg/dL (ref 0.0–1.2)
BILIRUBIN, DIRECT: 0.26 mg/dL (ref 0.00–0.40)
TOTAL PROTEIN: 8 g/dL (ref 6.0–8.5)

## 2016-06-09 LAB — PSA

## 2016-06-12 NOTE — Progress Notes (Signed)
Cardiology Office Note    Date:  06/13/2016   ID:  Paul, [name] 03-30-1946, MRN [id number]  PCP:  Mickie Hillier, MD  Cardiologist:  Zerenity Bowron [place], MD    History of Present Illness:  [name] is a 71 y.o. male seen to reestablish cardiac care. He has a history of CAD. He has a history of HTN, HLD, and DM. Last seen in 2014 by Dr. Sallyanne [name]. Reported in note to have had PCI of LAD in past. Patient reports he had stent in 1996 and then balloon angioplasty in 1999. Last myoview in EMR in 2009 showed diaphragmatic attenuation, otherwise normal. Echo in 2009 showed EF 45-50% with septal HK and mild MR.  When seen today his biggest complaint is of SOB and cough. States he got the flu 3 months ago and just can't get over it. Has a hard time breathing and can't lie down due to cough. Slight ankle swelling. Is finishing a course of antibiotics with Levaquin and does note some improvement. Denies any chest pain. No palpitations. He quit smoking in [facility] 2017.    Past Medical History:  Diagnosis Date  . Adenocarcinoma of prostate ([place])   . Anxiety   . CAD (coronary artery disease)   . 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 Crohn's disease ([place])   . CTS (carpal tunnel syndrome)    Mild  . Depression   . Diabetes mellitus without complication (HCC)    Type 2  . Hyperlipidemia   . Hypertension   . Kidney stone   . Optic neuritis     Past Surgical History:  Procedure Laterality Date  . COLONOSCOPY    . KNEE CARTILAGE SURGERY Right   . NASAL SINUS SURGERY    . PROSTATE SURGERY      Current Medications: Outpatient Medications Prior to Visit  Medication Sig Dispense Refill  . albuterol (PROVENTIL HFA;VENTOLIN HFA) 108 (90 BASE) MCG/ACT inhaler Inhale 2 puffs into the lungs every 6 (six) hours as needed for wheezing.    [name] ascorbic acid (VITAMIN C) 500 MG tablet Take 500 mg by mouth 2 (two) times daily.    [name] aspirin 325 MG EC tablet Take 325 mg by mouth daily.    .  clotrimazole (LOTRIMIN) 1 % cream Apply 1 application topically 2 (two) times daily.    [name] docusate sodium (COLACE) 100 MG capsule Take 100 mg by mouth 2 (two) times daily.    . flunisolide (NASAREL) 29 MCG/ACT (0.025%) nasal spray Place into the nose. 1 spray in each nostril at bedtime    . hydrochlorothiazide (HYDRODIURIL) 25 MG tablet Take 25 mg by mouth. 1/2 tablet daily    . levofloxacin (LEVAQUIN) 500 MG tablet Take 1 tablet (500 mg total) by mouth daily. 10 tablet 0  . LORazepam (ATIVAN) 1 MG tablet     . mesalamine (ASACOL) 400 MG EC tablet Take 400 mg by mouth 3 (three) times daily.     . metoprolol tartrate (LOPRESSOR) 25 MG tablet Take by mouth. Take 1/2 tablet BID    . nitroGLYCERIN (NITROSTAT) 0.4 MG SL tablet Place 1 tablet (0.4 mg total) under the tongue every 5 (five) minutes as needed. 25 tablet 12  . omeprazole (PRILOSEC) 20 MG capsule Take 20 mg by mouth daily.    [name] PARoxetine (PAXIL) 40 MG tablet Take 40 mg by mouth. Take 1 1/2 tablets at bedtime    . QUEtiapine (SEROQUEL XR) 50 MG TB24 Take 50 mg by  mouth. Take 1/2 tablet at bedtime    . rOPINIRole (REQUIP) 1 MG tablet Take 1 mg by mouth at bedtime.    . simvastatin (ZOCOR) 80 MG tablet Take by mouth at bedtime. One half tablet    . traZODone (DESYREL) 50 MG tablet Take by mouth at bedtime. 2 tablets    . lisinopril (PRINIVIL,ZESTRIL) 20 MG tablet Take 20 mg by mouth. 1/2 tablet daily     No facility-administered medications prior to visit.      Allergies:   Flomax [tamsulosin hcl] and Morphine and related   Social History   Social History  . Marital status: Married    Spouse name: N/A  . Number of children: N/A  . Years of education: 1   Occupational History  . Dealer    Social History Main Topics  . Smoking status: Former Smoker    Quit date: 02/22/2016  . Smokeless tobacco: Never Used  . Alcohol use None  . Drug use: Unknown  . Sexual activity: Not Asked   Other Topics Concern  . None   Social  History Narrative  . None     Family History:  The patient's family history includes Cancer in his mother; Coronary artery disease in his father; Crohn's disease in his brother; Heart attack in his father; Hypertension in his mother and sister.   ROS:   Please see the history of present illness.    ROS All other systems reviewed and are negative.   PHYSICAL EXAM:   VS:  BP 122/64 Comment: Right arm.  Pulse 71   Ht 5' 11"  (1.803 m)   Wt 157 lb (71.2 kg)   BMI 21.90 kg/m    GEN: Elderly, well developed, in no acute distress, coughing HEENT: normal  Neck: no JVD, carotid bruits, or masses Cardiac: RRR; no murmurs, rubs, or gallops,no edema. Pedal pulses barely palpable. No bruits. Respiratory:  Scant wheezing, normal work of breathing GI: soft, nontender, nondistended, + BS MS: no deformity or atrophy  Skin: warm and dry, no rash Neuro:  Alert and Oriented x 3, Strength and sensation are intact Psych: euthymic mood, full affect  Wt Readings from Last 3 Encounters:  06/13/16 157 lb (71.2 kg)  06/05/16 156 lb (70.8 kg)  04/10/16 170 lb 4 oz (77.2 kg)      Studies/Labs Reviewed:   EKG:  EKG is ordered today.  The ekg ordered today demonstrates NSR with first degree AV block. Otherwise normal. I have personally reviewed and interpreted this study.    Labs: 06/08/2016: ALT 93; BUN 40; Creatinine, Ser 1.83; Potassium 3.6; Sodium 138   Lipid Panel    Component Value Date/Time   CHOL 148 06/08/2016 0818   TRIG 94 06/08/2016 0818   HDL 46 06/08/2016 0818   CHOLHDL 3.2 06/08/2016 0818   LDLCALC 83 06/08/2016 0818    Additional studies/ records that were reviewed today include:  none  ASSESSMENT:    1. Essential hypertension   2. Coronary artery disease due to calcified coronary lesion   3. Hypercholesterolemia   4. Shortness of breath   5. PAD (peripheral artery disease) (HCC)      PLAN:  In order of problems listed above:  1. Patient has persistent dyspnea and  cough. I think this is mostly pulmonary related. Some improvement with antibiotics. Will check Echo to assess cardiac function. Recommend switching lisinopril to losartan 25 mg daily since ACEi may be exacerbating cough. 2. Patient is having no active anginal symptoms.  Will monitor for now. Continue medical therapy 3. On statin  4. Patient has poor pedal pulses. Long time smoker. Will check LE arterial dopplers.   Follow up in 3 months.    Medication Adjustments/Labs and Tests Ordered: Current medicines are reviewed at length with the patient today.  Concerns regarding medicines are outlined above.  Medication changes, Labs and Tests ordered today are listed in the Patient Instructions below. Patient Instructions  Stop taking lisinopril   Start losartan 25 mg daily  We will schedule you for an Echocardiogram and lower extremity arterial dopplers.   I will see you in 3 months.    Signed, Janney Priego Martinique, MD  06/13/2016 3:01 PM    [facility] [address], [place], [place], [address] [phone]

## 2016-06-13 ENCOUNTER — Ambulatory Visit (INDEPENDENT_AMBULATORY_CARE_PROVIDER_SITE_OTHER): Payer: Commercial Managed Care - HMO | Admitting: Cardiology

## 2016-06-13 ENCOUNTER — Encounter: Payer: Self-pay | Admitting: Cardiology

## 2016-06-13 VITALS — BP 122/64 | HR 71 | Ht 71.0 in | Wt 157.0 lb

## 2016-06-13 DIAGNOSIS — I10 Essential (primary) hypertension: Secondary | ICD-10-CM

## 2016-06-13 DIAGNOSIS — E7800 Pure hypercholesterolemia, unspecified: Secondary | ICD-10-CM

## 2016-06-13 DIAGNOSIS — I2584 Coronary atherosclerosis due to calcified coronary lesion: Secondary | ICD-10-CM | POA: Diagnosis not present

## 2016-06-13 DIAGNOSIS — I739 Peripheral vascular disease, unspecified: Secondary | ICD-10-CM | POA: Diagnosis not present

## 2016-06-13 DIAGNOSIS — I2510 Atherosclerotic heart disease of native coronary artery without angina pectoris: Secondary | ICD-10-CM | POA: Diagnosis not present

## 2016-06-13 DIAGNOSIS — R0602 Shortness of breath: Secondary | ICD-10-CM

## 2016-06-13 DIAGNOSIS — R0600 Dyspnea, unspecified: Secondary | ICD-10-CM | POA: Insufficient documentation

## 2016-06-13 MED ORDER — LOSARTAN POTASSIUM 25 MG PO TABS: 25.0000 mg | ORAL_TABLET | Freq: Every day | ORAL | 3 refills | Status: DC

## 2016-06-13 NOTE — Patient Instructions (Signed)
Stop taking lisinopril   Start losartan 25 mg daily  We will schedule you for an Echocardiogram and lower extremity arterial dopplers.   I will see you in 3 months.

## 2016-06-19 ENCOUNTER — Encounter: Payer: Self-pay | Admitting: Family Medicine

## 2016-06-19 ENCOUNTER — Ambulatory Visit (INDEPENDENT_AMBULATORY_CARE_PROVIDER_SITE_OTHER): Payer: Medicare HMO | Admitting: Family Medicine

## 2016-06-19 VITALS — BP 118/64 | Ht 71.0 in | Wt 163.0 lb

## 2016-06-19 DIAGNOSIS — C61 Malignant neoplasm of prostate: Secondary | ICD-10-CM

## 2016-06-19 DIAGNOSIS — K51219 Ulcerative (chronic) proctitis with unspecified complications: Secondary | ICD-10-CM

## 2016-06-19 DIAGNOSIS — I10 Essential (primary) hypertension: Secondary | ICD-10-CM

## 2016-06-19 DIAGNOSIS — R0600 Dyspnea, unspecified: Secondary | ICD-10-CM

## 2016-06-19 DIAGNOSIS — E785 Hyperlipidemia, unspecified: Secondary | ICD-10-CM

## 2016-06-19 NOTE — Progress Notes (Signed)
   Subjective:    Patient ID: [name], male    DOB: 03-Apr-1946, 71 y.o.   MRN: [id number]  HPI Patient in today for a two week follow up. Patient was last referred to specialist on last visit. Recently had a visit with Cardiology.  Blood pressure medicine and blood pressure levels reviewed today with patient. Compliant with blood pressure medicine. States does not miss a dose. No obvious side effects. Blood pressure generally good when checked elsewhere. Watching salt intake.   Patient continues to take lipid medication regularly. No obvious side effects from it. Generally does not miss a dose. Prior blood work results are reviewed with patient. Patient continues to work on fat intake in diet   . Overall respiratory function is improved. Less cough less wheezing.   Energy level has improved, bowels doing bette r and urinating better   Results for orders placed or performed in visit on 06/05/16  Lipid panel  Result Value Ref Range   Cholesterol, Total 148 100 - 199 mg/dL   Triglycerides 94 0 - 149 mg/dL   HDL 46 >39 mg/dL   VLDL Cholesterol Cal 19 5 - 40 mg/dL   LDL Calculated 83 0 - 99 mg/dL   Chol/HDL Ratio 3.2 0.0 - 5.0 ratio units  Hepatic function panel  Result Value Ref Range   Total Protein 8.0 6.0 - 8.5 g/dL   Albumin 3.7 3.5 - 4.8 g/dL   Bilirubin Total 0.6 0.0 - 1.2 mg/dL   Bilirubin, Direct 0.26 0.00 - 0.40 mg/dL   Alkaline Phosphatase 150 (H) 39 - 117 IU/L   AST 68 (H) 0 - 40 IU/L   ALT 93 (H) 0 - 44 IU/L  Basic metabolic panel  Result Value Ref Range   Glucose 128 (H) 65 - 99 mg/dL   BUN 40 (H) 8 - 27 mg/dL   Creatinine, Ser 1.83 (H) 0.76 - 1.27 mg/dL   GFR calc non Af Amer 37 (L) >59 mL/min/1.73   GFR calc Af Amer 42 (L) >59 mL/min/1.73   BUN/Creatinine Ratio 22 10 - 24   Sodium 138 134 - 144 mmol/L   Potassium 3.6 3.5 - 5.2 mmol/L   Chloride 97 96 - 106 mmol/L   CO2 21 18 - 29 mmol/L   Calcium 9.4 8.6 - 10.2 mg/dL  PSA  Result Value Ref  Range   Prostate Specific Ag, Serum <0.1 0.0 - 4.0 ng/mL     Patient has concerns of fatigue.   Review of Systems     Objective:   Physical Exam        Assessment & Plan:

## 2016-06-26 ENCOUNTER — Other Ambulatory Visit: Payer: Self-pay | Admitting: Cardiology

## 2016-06-26 DIAGNOSIS — E7800 Pure hypercholesterolemia, unspecified: Secondary | ICD-10-CM

## 2016-06-26 DIAGNOSIS — I739 Peripheral vascular disease, unspecified: Secondary | ICD-10-CM

## 2016-06-26 DIAGNOSIS — I2584 Coronary atherosclerosis due to calcified coronary lesion: Secondary | ICD-10-CM

## 2016-06-26 DIAGNOSIS — R0602 Shortness of breath: Secondary | ICD-10-CM

## 2016-06-26 DIAGNOSIS — I10 Essential (primary) hypertension: Secondary | ICD-10-CM

## 2016-06-26 DIAGNOSIS — I2510 Atherosclerotic heart disease of native coronary artery without angina pectoris: Secondary | ICD-10-CM

## 2016-06-26 NOTE — Addendum Note (Signed)
Addended by: Ofilia Neas R on: 06/26/2016 10:39 AM   Modules accepted: Orders

## 2016-07-02 ENCOUNTER — Other Ambulatory Visit (HOSPITAL_COMMUNITY): Payer: Commercial Managed Care - HMO

## 2016-07-05 ENCOUNTER — Ambulatory Visit (HOSPITAL_COMMUNITY)
Admission: RE | Admit: 2016-07-05 | Discharge: 2016-07-05 | Disposition: A | Discharge status: Home / Self Care | Payer: Medicare HMO | Source: Ambulatory Visit | Attending: Cardiology | Admitting: Cardiology

## 2016-07-05 ENCOUNTER — Ambulatory Visit (HOSPITAL_COMMUNITY)
Admission: RE | Admit: 2016-07-05 | Discharge: 2016-07-05 | Disposition: A | Discharge status: Home / Self Care | Payer: Medicare HMO | Source: Ambulatory Visit | Attending: Family Medicine | Admitting: Family Medicine

## 2016-07-05 DIAGNOSIS — I771 Stricture of artery: Secondary | ICD-10-CM | POA: Diagnosis not present

## 2016-07-05 DIAGNOSIS — R9439 Abnormal result of other cardiovascular function study: Secondary | ICD-10-CM | POA: Insufficient documentation

## 2016-07-05 DIAGNOSIS — J9811 Atelectasis: Secondary | ICD-10-CM | POA: Diagnosis not present

## 2016-07-05 DIAGNOSIS — I10 Essential (primary) hypertension: Secondary | ICD-10-CM | POA: Diagnosis not present

## 2016-07-05 DIAGNOSIS — R05 Cough: Secondary | ICD-10-CM | POA: Diagnosis not present

## 2016-07-05 DIAGNOSIS — I743 Embolism and thrombosis of arteries of the lower extremities: Secondary | ICD-10-CM | POA: Insufficient documentation

## 2016-07-05 DIAGNOSIS — I2510 Atherosclerotic heart disease of native coronary artery without angina pectoris: Secondary | ICD-10-CM | POA: Diagnosis present

## 2016-07-05 DIAGNOSIS — I2584 Coronary atherosclerosis due to calcified coronary lesion: Secondary | ICD-10-CM

## 2016-07-05 DIAGNOSIS — R0600 Dyspnea, unspecified: Secondary | ICD-10-CM | POA: Diagnosis present

## 2016-07-05 DIAGNOSIS — R0602 Shortness of breath: Secondary | ICD-10-CM

## 2016-07-05 DIAGNOSIS — E7800 Pure hypercholesterolemia, unspecified: Secondary | ICD-10-CM

## 2016-07-05 DIAGNOSIS — I739 Peripheral vascular disease, unspecified: Secondary | ICD-10-CM | POA: Insufficient documentation

## 2016-07-06 ENCOUNTER — Encounter: Payer: Self-pay | Admitting: Internal Medicine

## 2016-07-09 ENCOUNTER — Telehealth: Payer: Self-pay | Admitting: Cardiology

## 2016-07-09 NOTE — Telephone Encounter (Signed)
Called patient and left a voicemail for him to call back to schedule a consult with Dr. Gwenlyn Found for tingling in both legs and abnormal lower extremity dopplers.

## 2016-07-13 ENCOUNTER — Ambulatory Visit (INDEPENDENT_AMBULATORY_CARE_PROVIDER_SITE_OTHER): Payer: Medicare HMO | Admitting: Family Medicine

## 2016-07-13 ENCOUNTER — Encounter: Payer: Self-pay | Admitting: Family Medicine

## 2016-07-13 VITALS — BP 110/72 | Ht 71.0 in | Wt 165.0 lb

## 2016-07-13 DIAGNOSIS — F17200 Nicotine dependence, unspecified, uncomplicated: Secondary | ICD-10-CM | POA: Diagnosis not present

## 2016-07-13 DIAGNOSIS — R05 Cough: Secondary | ICD-10-CM

## 2016-07-13 DIAGNOSIS — J9811 Atelectasis: Secondary | ICD-10-CM | POA: Diagnosis not present

## 2016-07-13 DIAGNOSIS — R053 Chronic cough: Secondary | ICD-10-CM

## 2016-07-13 DIAGNOSIS — Z79899 Other long term (current) drug therapy: Secondary | ICD-10-CM | POA: Diagnosis not present

## 2016-07-13 DIAGNOSIS — R21 Rash and other nonspecific skin eruption: Secondary | ICD-10-CM

## 2016-07-13 MED ORDER — TRIAMCINOLONE ACETONIDE 0.1 % EX CREA: 1.0000 "application " | TOPICAL_CREAM | Freq: Two times a day (BID) | CUTANEOUS | 2 refills | Status: DC

## 2016-07-13 MED ORDER — ALBUTEROL SULFATE HFA 108 (90 BASE) MCG/ACT IN AERS: 2.0000 | INHALATION_SPRAY | Freq: Four times a day (QID) | RESPIRATORY_TRACT | 0 refills | Status: DC | PRN

## 2016-07-13 NOTE — Patient Instructions (Signed)
The x ray shows something called atelectasis  Sometimes this comes from lungs not expanding completely  Sometimes this can be a sign of something significant in the chest, so we need to do a scan

## 2016-07-13 NOTE — Progress Notes (Signed)
   Subjective:  Patient arrives office with numerous concerns  Patient ID: [name], male    DOB: 01-11-1946, 71 y.o.   MRN: [id number]  HPIFollow up xray results.   Still having chest congestion. And cough. Now reports cough several months. Repeat chest x-ray revealed ongoing atelectasis. Patient does history of substantial smoking up until this winter.  Ears stopped up. Popping crackling sound swallowing.  Intermittent wheeze. Needs more albuterol. Uses nearly daily.  Itchy rash legs. Pruritic in nature. History of eczema. Goldbond cream did not help as much                     Rash on legs and back. Itchy. Had for years. Using gold bond lotion and oatmeal.  Wants to get rx for albuterol inhaler. Usually gets through [place].    Review of Systems No headache, no major weight loss or weight gain, no chest pain no back pain abdominal pain no change in bowel habits complete ROS otherwise negative     Objective:   Physical Exam  Alert and oriented, vitals reviewed and stable, NAD ENT-TM's and ext canals WNL bilat via otoscopic exam Soft palate, tonsils and post pharynx WNL via oropharyngeal exam Neck-symmetric, no masses; thyroid nonpalpable and nontender Pulmonary-no tachypnea or accessory muscle use; Very mild wheezes via auscultation Card--no abnrml murmurs, rhythm reg and rate WNL Carotid pulses symmetric, without bruits Skin substantial eczema changes both legs      Assessment & Plan:  Impression 1 protracted wheezing post respiratory infection #2 persistent x-ray changes. Concerning with the patient's prolonged cough. Particularly in light of chronic smoking history #3 rash eczema nature discuss labs steroid cream plan CT of chest. Rationale discussed. Albuterol refill. Diet exercise discussed. Follow-up regular appointment. Further recommendations based on scan results  Greater than 50% of this 25 minute face to face visit was spent in  counseling and discussion and coordination of care regarding the above diagnosis/diagnosies

## 2016-07-14 LAB — CREATININE, SERUM
CREATININE: 1.22 mg/dL (ref 0.76–1.27)
GFR calc Af Amer: 69 mL/min/{1.73_m2} (ref >59)
GFR calc non Af Amer: 60 mL/min/{1.73_m2} (ref >59)

## 2016-07-17 ENCOUNTER — Ambulatory Visit (HOSPITAL_COMMUNITY): Payer: Medicare HMO | Attending: Cardiology

## 2016-07-17 ENCOUNTER — Other Ambulatory Visit: Payer: Self-pay

## 2016-07-17 DIAGNOSIS — I349 Nonrheumatic mitral valve disorder, unspecified: Secondary | ICD-10-CM | POA: Insufficient documentation

## 2016-07-17 DIAGNOSIS — I739 Peripheral vascular disease, unspecified: Secondary | ICD-10-CM

## 2016-07-17 DIAGNOSIS — I501 Left ventricular failure: Secondary | ICD-10-CM | POA: Diagnosis not present

## 2016-07-17 DIAGNOSIS — I351 Nonrheumatic aortic (valve) insufficiency: Secondary | ICD-10-CM | POA: Diagnosis not present

## 2016-07-17 DIAGNOSIS — E7800 Pure hypercholesterolemia, unspecified: Secondary | ICD-10-CM | POA: Diagnosis not present

## 2016-07-17 DIAGNOSIS — R0602 Shortness of breath: Secondary | ICD-10-CM

## 2016-07-17 DIAGNOSIS — I10 Essential (primary) hypertension: Secondary | ICD-10-CM | POA: Diagnosis not present

## 2016-07-17 DIAGNOSIS — I2510 Atherosclerotic heart disease of native coronary artery without angina pectoris: Secondary | ICD-10-CM

## 2016-07-17 DIAGNOSIS — I2584 Coronary atherosclerosis due to calcified coronary lesion: Secondary | ICD-10-CM | POA: Diagnosis not present

## 2016-07-20 ENCOUNTER — Encounter: Payer: Self-pay | Admitting: Family Medicine

## 2016-07-24 ENCOUNTER — Telehealth: Payer: Self-pay | Admitting: Family Medicine

## 2016-07-24 ENCOUNTER — Ambulatory Visit (INDEPENDENT_AMBULATORY_CARE_PROVIDER_SITE_OTHER): Payer: Medicare HMO | Admitting: Urology

## 2016-07-24 DIAGNOSIS — C61 Malignant neoplasm of prostate: Secondary | ICD-10-CM

## 2016-07-24 NOTE — Telephone Encounter (Signed)
sure

## 2016-07-24 NOTE — Telephone Encounter (Signed)
Received a phone call from Pre-service center stating precert is needed for CT chest with contrast scheduled on tomorrow. Pre Cert submitted to patient's insurance on 07/17/16 and we are awaiting response. FYI patient's particular insurance requires that we fax them and usually it takes several business days to get a response. We would need the precertification by 2pm today. May we reschedule CT for a later date so that we may have time to receive approval?

## 2016-07-24 NOTE — Telephone Encounter (Signed)
Left message return call 07/24/2016

## 2016-07-25 ENCOUNTER — Ambulatory Visit (HOSPITAL_COMMUNITY): Payer: Medicare HMO

## 2016-07-25 ENCOUNTER — Ambulatory Visit (HOSPITAL_COMMUNITY): Admission: RE | Admit: 2016-07-25 | Payer: Medicare HMO | Source: Ambulatory Visit

## 2016-08-07 ENCOUNTER — Ambulatory Visit (HOSPITAL_COMMUNITY)
Admission: RE | Admit: 2016-08-07 | Discharge: 2016-08-07 | Disposition: A | Discharge status: Home / Self Care | Payer: Medicare HMO | Source: Ambulatory Visit | Attending: Family Medicine | Admitting: Family Medicine

## 2016-08-07 DIAGNOSIS — R05 Cough: Secondary | ICD-10-CM | POA: Diagnosis not present

## 2016-08-07 DIAGNOSIS — F17200 Nicotine dependence, unspecified, uncomplicated: Secondary | ICD-10-CM | POA: Insufficient documentation

## 2016-08-07 DIAGNOSIS — J984 Other disorders of lung: Secondary | ICD-10-CM | POA: Insufficient documentation

## 2016-08-07 DIAGNOSIS — I700 Atherosclerosis of aorta: Secondary | ICD-10-CM | POA: Diagnosis not present

## 2016-08-07 DIAGNOSIS — I2510 Atherosclerotic heart disease of native coronary artery without angina pectoris: Secondary | ICD-10-CM | POA: Insufficient documentation

## 2016-08-07 DIAGNOSIS — M4324 Fusion of spine, thoracic region: Secondary | ICD-10-CM | POA: Diagnosis not present

## 2016-08-07 MED ORDER — IOPAMIDOL (ISOVUE-300) INJECTION 61%
75.0000 mL | Freq: Once | INTRAVENOUS | Status: AC | PRN
  Administered 2016-08-07: 75 mL via INTRAVENOUS

## 2016-08-08 ENCOUNTER — Encounter: Payer: Self-pay | Admitting: Cardiovascular Disease

## 2016-08-08 ENCOUNTER — Other Ambulatory Visit: Payer: Self-pay | Admitting: Cardiovascular Disease

## 2016-08-08 ENCOUNTER — Ambulatory Visit (INDEPENDENT_AMBULATORY_CARE_PROVIDER_SITE_OTHER): Payer: Medicare HMO | Admitting: Cardiovascular Disease

## 2016-08-08 VITALS — BP 118/64 | HR 54 | Ht 71.0 in | Wt 167.7 lb

## 2016-08-08 DIAGNOSIS — R0989 Other specified symptoms and signs involving the circulatory and respiratory systems: Secondary | ICD-10-CM

## 2016-08-08 DIAGNOSIS — Z79899 Other long term (current) drug therapy: Secondary | ICD-10-CM | POA: Diagnosis not present

## 2016-08-08 DIAGNOSIS — I739 Peripheral vascular disease, unspecified: Secondary | ICD-10-CM | POA: Diagnosis not present

## 2016-08-08 DIAGNOSIS — Z7901 Long term (current) use of anticoagulants: Secondary | ICD-10-CM | POA: Diagnosis not present

## 2016-08-08 LAB — CBC WITH DIFFERENTIAL/PLATELET
BASOS ABS: 0 {cells}/uL (ref 0–200)
Basophils Relative: 0 %
EOS PCT: 1 %
Eosinophils Absolute: 86 cells/uL (ref 15–500)
HCT: 39.1 % (ref 38.5–50.0)
HEMOGLOBIN: 12.8 g/dL — AB (ref 13.2–17.1)
LYMPHS PCT: 19 %
Lymphs Abs: 1634 cells/uL (ref 850–3900)
MCH: 31.7 pg (ref 27.0–33.0)
MCHC: 32.7 g/dL (ref 32.0–36.0)
MCV: 96.8 fL (ref 80.0–100.0)
MPV: 10.6 fL (ref 7.5–12.5)
Monocytes Absolute: 602 cells/uL (ref 200–950)
Monocytes Relative: 7 %
NEUTROS PCT: 73 %
Neutro Abs: 6278 cells/uL (ref 1500–7800)
PLATELETS: 202 10*3/uL (ref 140–400)
RBC: 4.04 MIL/uL — AB (ref 4.20–5.80)
RDW: 13.7 % (ref 11.0–15.0)
WBC: 8.6 10*3/uL (ref 3.8–10.8)

## 2016-08-08 LAB — BASIC METABOLIC PANEL WITH GFR
BUN: 24 mg/dL (ref 7–25)
CO2: 23 mmol/L (ref 20–31)
CREATININE: 1.21 mg/dL — AB (ref 0.70–1.18)
Calcium: 9.5 mg/dL (ref 8.6–10.3)
Chloride: 104 mmol/L (ref 98–110)
GFR, EST AFRICAN AMERICAN: 70 mL/min (ref >=60)
GFR, Est Non African American: 60 mL/min (ref >=60)
Glucose, Bld: 126 mg/dL — ABNORMAL HIGH (ref 65–99)
Potassium: 3.9 mmol/L (ref 3.5–5.3)
Sodium: 140 mmol/L (ref 135–146)

## 2016-08-08 NOTE — Patient Instructions (Addendum)
   [place] [address] [place] [address] Dept: [phone] Loc: [place]  08/08/2016  You are scheduled for a Peripheral Angiogram on Monday, April 23 with Dr. Quay Burow.  1. Please arrive at the [facility] (Main Entrance A) at [facility]: [address] [place], [place] [address] at                      (two hours before your procedure to ensure your preparation). Free valet parking service is available.   Special note: Every effort is made to have your procedure done on time. Please understand that emergencies sometimes delay scheduled procedures.  2. Diet: Do not eat or drink anything after midnight prior to your procedure except sips of water to take medications.  3. Labs: You will need to have blood drawn on Wednesday, April 18 at [place], [place]  Open: [place] (Lunch 12:30 - 1:30)   Phone: [phone]. You do not need to be fasting.  4. Medication instructions in preparation for your procedure:  On the morning of your procedure, take your Aspirin and any morning medicines NOT listed above.  You may use sips of water.  5. Plan for one night stay--bring personal belongings. 6. Bring a current list of your medications and current insurance cards. 7. You MUST have a responsible person to drive you home. 8. Someone MUST be with you the first 24 hours after you arrive home or your discharge will be delayed. 9. Please wear clothes that are easy to get on and off and wear slip-on shoes.  Thank you for allowing [place] to care for you!   -- [facility]  Puncture Site:  Rt Groin REP: Nicki Reaper Dx:  Claudication   Your physician has requested that you have a carotid duplex--can schedule for after your procedure. This test is an ultrasound of the carotid arteries in your neck. It looks  at blood flow through these arteries that supply the brain with blood. Allow one hour for this exam. There are no restrictions or special instructions.

## 2016-08-08 NOTE — Addendum Note (Signed)
Addended by: [name] on: 08/08/2016 09:33 AM   Modules accepted: Level of Service

## 2016-08-08 NOTE — Progress Notes (Signed)
08/08/2016 ATIBA KIMBERLIN   [birth year]  [id number]  Primary Physician Mickie Hillier, MD Primary Cardiologist: [name] MD Renae Gloss  HPI:  Mr. 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 He was referred by Dr. Martinique for peripheral vascular evaluation of lifestyle limiting claudication. He does have a long cardiac history with interventions dating back to 89. He has a 50-pack-year history of tobacco abuse having quit back in November of last year as well as history of hypertension, hyperlipidemia and non-insulin-dependent diabetes. Never had a heart attack or stroke. He has normal LV function by 2-D echo. She does complain of lifestyle limiting claudication. Recent Dopplers revealed ABIs in the 0.6 range with occluded SFAs bilaterally.   Current Outpatient Prescriptions  Medication Sig Dispense Refill  . albuterol (PROVENTIL HFA;VENTOLIN HFA) 108 (90 Base) MCG/ACT inhaler Inhale 2 puffs into the lungs every 6 (six) hours as needed for wheezing. 1 Inhaler 0  . ascorbic acid (VITAMIN C) 500 MG tablet Take 500 mg by mouth 2 (two) times daily.    [name] aspirin 325 MG EC tablet Take 325 mg by mouth daily.    . clotrimazole (LOTRIMIN) 1 % cream Apply 1 application topically 2 (two) times daily.    [name] docusate sodium (COLACE) 100 MG capsule Take 100 mg by mouth 2 (two) times daily.    . flunisolide (NASAREL) 29 MCG/ACT (0.025%) nasal spray Place into the nose. 1 spray in each nostril at bedtime    . hydrochlorothiazide (HYDRODIURIL) 25 MG tablet Take 25 mg by mouth. 1/2 tablet daily    . LORazepam (ATIVAN) 1 MG tablet     . losartan (COZAAR) 25 MG tablet Take 1 tablet (25 mg total) by mouth daily. 90 tablet 3  . mesalamine (ASACOL) 400 MG EC tablet Take 400 mg by mouth 3 (three) times daily.     . metoprolol tartrate (LOPRESSOR) 25 MG tablet Take by mouth. Take 1/2  tablet BID    . nitroGLYCERIN (NITROSTAT) 0.4 MG SL tablet Place 1 tablet (0.4 mg total) under the tongue every 5 (five) minutes as needed. 25 tablet 12  . omeprazole (PRILOSEC) 20 MG capsule Take 20 mg by mouth daily.    [name] PARoxetine (PAXIL) 40 MG tablet Take 40 mg by mouth. Take 1 1/2 tablets at bedtime    . rOPINIRole (REQUIP) 1 MG tablet Take 1 mg by mouth at bedtime.    . simvastatin (ZOCOR) 80 MG tablet Take by mouth at bedtime. One half tablet    . traZODone (DESYREL) 50 MG tablet Take by mouth at bedtime. 2 tablets    . triamcinolone cream (KENALOG) 0.1 % Apply 1 application topically 2 (two) times daily. 60 g 2   No current facility-administered medications for this visit.     Allergies  Allergen Reactions  . Flomax [Tamsulosin Hcl]     High dose   . Morphine And Related     Social History   Social History  . Marital status: Married    Spouse name: N/A  . Number of children: N/A  . Years of education: 1   Occupational History  . Dealer    Social History Main Topics  . Smoking status: Former Smoker    Quit date: 02/22/2016  . Smokeless tobacco: Never Used  . Alcohol use Not on file  . Drug use: Unknown  . Sexual activity:  Not on file   Other Topics Concern  . Not on file   Social History Narrative  . No narrative on file     Review of Systems: General: negative for chills, fever, night sweats or weight changes.  Cardiovascular: negative for chest pain, dyspnea on exertion, edema, orthopnea, palpitations, paroxysmal nocturnal dyspnea or shortness of breath Dermatological: negative for rash Respiratory: negative for cough or wheezing Urologic: negative for hematuria Abdominal: negative for nausea, vomiting, diarrhea, bright red blood per rectum, melena, or hematemesis Neurologic: negative for visual changes, syncope, or dizziness All other systems reviewed and are otherwise negative except as noted above.    Blood pressure 118/64, pulse (!) 54, height  5' 11"  (1.803 m), weight 167 lb 10.6 oz (76.1 kg), SpO2 96 %.  General appearance: alert and no distress Neck: no adenopathy, no JVD, supple, symmetrical, trachea midline, thyroid not enlarged, symmetric, no tenderness/mass/nodules and Soft right carotid bruit Lungs: clear to auscultation bilaterally Heart: regular rate and rhythm, S1, S2 normal, no murmur, click, rub or gallop Extremities: extremities normal, atraumatic, no cyanosis or edema  EKG not performed  today  ASSESSMENT AND PLAN:   PAD (peripheral artery disease) (HCC) History of peripheral arterial disease by history and Doppler studies. He was referred by Dr. Martinique. Recent Dopplers performed 07/05/16 revealed ABIs in the 0.6 range bilaterally occluded SFAs and intact tibial vessels. I'm going to arrange angiography and potential endovascular therapy for lifestyle limiting claudication.      [name] MD FACP,FACC,FAHA, [facility] 08/08/2016 9:16 AM

## 2016-08-08 NOTE — Assessment & Plan Note (Signed)
History of peripheral arterial disease by history and Doppler studies. He was referred by Dr. Martinique. Recent Dopplers performed 07/05/16 revealed ABIs in the 0.6 range bilaterally occluded SFAs and intact tibial vessels. I'm going to arrange angiography and potential endovascular therapy for lifestyle limiting claudication.

## 2016-08-09 LAB — PROTIME-INR
INR: 1
Prothrombin Time: 10.4 s (ref 9.0–11.5)

## 2016-08-09 LAB — APTT: aPTT: 30 s (ref 22–34)

## 2016-08-10 ENCOUNTER — Ambulatory Visit (INDEPENDENT_AMBULATORY_CARE_PROVIDER_SITE_OTHER): Payer: Medicare HMO | Admitting: Internal Medicine

## 2016-08-10 ENCOUNTER — Encounter: Payer: Self-pay | Admitting: Internal Medicine

## 2016-08-10 VITALS — BP 112/62 | HR 68 | Ht 71.0 in | Wt 168.2 lb

## 2016-08-10 DIAGNOSIS — R7989 Other specified abnormal findings of blood chemistry: Secondary | ICD-10-CM | POA: Diagnosis not present

## 2016-08-10 DIAGNOSIS — K5010 Crohn's disease of large intestine without complications: Secondary | ICD-10-CM

## 2016-08-10 DIAGNOSIS — K219 Gastro-esophageal reflux disease without esophagitis: Secondary | ICD-10-CM

## 2016-08-10 DIAGNOSIS — R945 Abnormal results of liver function studies: Secondary | ICD-10-CM

## 2016-08-10 DIAGNOSIS — K625 Hemorrhage of anus and rectum: Secondary | ICD-10-CM

## 2016-08-10 DIAGNOSIS — K5901 Slow transit constipation: Secondary | ICD-10-CM | POA: Diagnosis not present

## 2016-08-10 MED ORDER — NA SULFATE-K SULFATE-MG SULF 17.5-3.13-1.6 GM/177ML PO SOLN: 1.0000 | Freq: Once | ORAL | 0 refills | Status: DC

## 2016-08-10 NOTE — Progress Notes (Signed)
HISTORY OF PRESENT ILLNESS:  [name] is a 71 y.o. male with multiple significant medical problems including coronary artery disease, prostate cancer, hypertension, hyperlipidemia, and Crohn's colitis. He is sent today by his primary care provider Dr. [name] to reestablish GI care and evaluate chief complaints of constipation and rectal bleeding. Been seen in this office since April 2010. GI problems include reflux disease for which she is maintained on PPI with good control of symptoms and Crohn's colitis for which she has been on mesalamine products. Last colonoscopy 2009 with no evidence of active colitis. He has been lost to follow-up since that last visit. He tells me that he has been receiving his care from the [facility]. He has not had interval colonoscopy. Currently taking omeprazole 20 mg daily for GERD and Apriso (mesalamine) 0.375 mg twice daily. He denies diarrhea. Does have intermittent constipation issues for which she takes Colace. Also dietary fiber. Does mention intermittent problems with rectal bleeding. Several episodes earlier this year. He thinks this may be due to hemorrhoids and constipation. No problems with abdominal pain. No breakthrough reflux symptoms or dysphagia.  Review results from February 2018 reveals mild elevation of transaminases with AST 68, ALT 93, phosphatase 150, total bilirubin 0.6. Creatinine 1.83. BUN 40. No CBC  REVIEW OF SYSTEMS:  All non-GI ROS negative except for confusion, depression, fatigue, hearing problems, sleeping problems, leg swelling, left eye impaired vision  Past Medical History:  Diagnosis Date  . Adenocarcinoma of prostate ([place])   . Anxiety   . CAD (coronary artery disease)   . 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 Crohn's disease ([place])   . CTS (carpal tunnel syndrome)    Mild  . Depression   . Diabetes mellitus without complication (HCC)    Type 2  . Hyperlipidemia   . Hypertension   . Kidney stone   . Optic neuritis      Past Surgical History:  Procedure Laterality Date  . COLONOSCOPY    . KNEE CARTILAGE SURGERY Right   . NASAL SINUS SURGERY    . PROSTATE SURGERY      Social History [name]  reports that he quit smoking about 5 months ago. He has never used smokeless tobacco. He reports that he does not drink alcohol or use drugs.  family history includes Colon cancer in his mother; Coronary artery disease in his father; Crohn's disease in his brother; Heart attack in his father; Hypertension in his mother and sister.  Allergies  Allergen Reactions  . Flomax [Tamsulosin Hcl]     High dose   . Morphine And Related        PHYSICAL EXAMINATION: Vital signs: BP 112/62   Pulse 68   Ht 5' 11"  (1.803 m)   Wt 168 lb 3.2 oz (76.3 kg)   BMI 23.46 kg/m   Constitutional: generally well-appearing, no acute distress Psychiatric: alert and oriented x3, cooperative Eyes: extraocular movements intact, anicteric, conjunctiva pink Mouth: oral pharynx moist, no lesions Neck: suppleWithout thyromegaly  lymph: no lymphadenopathy Cardiovascular: heart regular rate and rhythm, no murmur Lungs: clear to auscultation bilaterally Abdomen: soft, nontender, nondistended, no obvious ascites, no peritoneal signs, normal bowel sounds, no organomegaly Rectal: Deferred until colonoscopy Extremities: no clubbing cyanosis or lower extremity edema bilaterally Skin: no lesions on visible extremities Neuro: No focal deficits. Normal DTRs. No asterixis.    ASSESSMENT:  #1. Crohn's colitis. Last colonoscopy 2009 without active disease. On chronic mesalamine #2. Intermittent rectal bleeding. Likely benign  anorectal pathology. Rule out neoplasia. Rule out Crohn's #3. Chronic constipation. Likely functional #4. Chronic GERD. Symptoms controlled with PPI #5. Multiple significant medical problems #6. Elevated LFTs. ? chronic  PLAN:  #1. Schedule colonoscopy.The nature of the procedure, as well as the  risks, benefits, and alternatives were carefully and thoroughly reviewed with the patient. Ample time for discussion and questions allowed. The patient understood, was satisfied, and agreed to proceed. #2. Continue mesalamine #3. Relux precautions #4. continue PPI #5. Will need follow up LFT's. If elevated again, workup. Address at follow up  Copy to Dr Wolfgang Phoenix

## 2016-08-10 NOTE — Patient Instructions (Signed)

## 2016-08-13 ENCOUNTER — Encounter (HOSPITAL_COMMUNITY): Payer: Self-pay | Admitting: *Deleted

## 2016-08-13 ENCOUNTER — Ambulatory Visit (HOSPITAL_COMMUNITY)
Admission: RE | Admit: 2016-08-13 | Discharge: 2016-08-13 | Disposition: A | Discharge status: Home / Self Care | Payer: Medicare HMO | Source: Ambulatory Visit | Attending: Cardiovascular Disease | Admitting: Cardiovascular Disease

## 2016-08-13 ENCOUNTER — Encounter (HOSPITAL_COMMUNITY)
Admission: RE | Disposition: A | Discharge status: Home / Self Care | Payer: Self-pay | Source: Ambulatory Visit | Attending: Cardiovascular Disease

## 2016-08-13 DIAGNOSIS — Z7982 Long term (current) use of aspirin: Secondary | ICD-10-CM | POA: Diagnosis not present

## 2016-08-13 DIAGNOSIS — Z87891 Personal history of nicotine dependence: Secondary | ICD-10-CM | POA: Diagnosis not present

## 2016-08-13 DIAGNOSIS — E663 Overweight: Secondary | ICD-10-CM | POA: Insufficient documentation

## 2016-08-13 DIAGNOSIS — I739 Peripheral vascular disease, unspecified: Secondary | ICD-10-CM | POA: Diagnosis present

## 2016-08-13 DIAGNOSIS — Z6823 Body mass index (BMI) 23.0-23.9, adult: Secondary | ICD-10-CM | POA: Diagnosis not present

## 2016-08-13 DIAGNOSIS — I700 Atherosclerosis of aorta: Secondary | ICD-10-CM | POA: Insufficient documentation

## 2016-08-13 DIAGNOSIS — I10 Essential (primary) hypertension: Secondary | ICD-10-CM | POA: Insufficient documentation

## 2016-08-13 DIAGNOSIS — E1151 Type 2 diabetes mellitus with diabetic peripheral angiopathy without gangrene: Secondary | ICD-10-CM | POA: Diagnosis not present

## 2016-08-13 DIAGNOSIS — F4310 Post-traumatic stress disorder, unspecified: Secondary | ICD-10-CM | POA: Diagnosis not present

## 2016-08-13 DIAGNOSIS — E784 Other hyperlipidemia: Secondary | ICD-10-CM | POA: Insufficient documentation

## 2016-08-13 DIAGNOSIS — I70213 Atherosclerosis of native arteries of extremities with intermittent claudication, bilateral legs: Secondary | ICD-10-CM | POA: Insufficient documentation

## 2016-08-13 HISTORY — PX: LOWER EXTREMITY ANGIOGRAPHY: CATH118251

## 2016-08-13 SURGERY — LOWER EXTREMITY ANGIOGRAPHY
Anesthesia: LOCAL

## 2016-08-13 MED ORDER — SODIUM CHLORIDE 0.9 % WEIGHT BASED INFUSION: 1.0000 mL/kg/h | INTRAVENOUS | Status: DC

## 2016-08-13 MED ORDER — SODIUM CHLORIDE 0.9 % IV SOLN
INTRAVENOUS | Status: DC
Start: 2016-08-13 — End: 2016-08-13

## 2016-08-13 MED ORDER — SODIUM CHLORIDE 0.9% FLUSH: 3.0000 mL | INTRAVENOUS | Status: DC | PRN

## 2016-08-13 MED ORDER — ACETAMINOPHEN 325 MG PO TABS: 650.0000 mg | ORAL_TABLET | ORAL | Status: DC | PRN

## 2016-08-13 MED ORDER — SODIUM CHLORIDE 0.9 % WEIGHT BASED INFUSION
3.0000 mL/kg/h | INTRAVENOUS | Status: AC
  Administered 2016-08-13: 3 mL/kg/h via INTRAVENOUS

## 2016-08-13 MED ORDER — ASPIRIN EC 81 MG PO TBEC
81.0000 mg | DELAYED_RELEASE_TABLET | Freq: Every day | ORAL | Status: DC
  Filled 2016-08-13: qty 1

## 2016-08-13 MED ORDER — ASPIRIN 81 MG PO CHEW: 81.0000 mg | CHEWABLE_TABLET | ORAL | Status: DC

## 2016-08-13 MED ORDER — LIDOCAINE HCL (PF) 1 % IJ SOLN
INTRAMUSCULAR | Status: DC | PRN
  Administered 2016-08-13: 20 mL via INTRADERMAL

## 2016-08-13 MED ORDER — IOPAMIDOL (ISOVUE-370) INJECTION 76%
INTRAVENOUS | Status: DC | PRN
  Administered 2016-08-13: 117 mL via INTRA_ARTERIAL

## 2016-08-13 MED ORDER — HEPARIN (PORCINE) IN NACL 2-0.9 UNIT/ML-% IJ SOLN
INTRAMUSCULAR | Status: DC | PRN
  Administered 2016-08-13: 1000 mL

## 2016-08-13 MED ORDER — ONDANSETRON HCL 4 MG/2ML IJ SOLN: 4.0000 mg | Freq: Four times a day (QID) | INTRAMUSCULAR | Status: DC | PRN

## 2016-08-13 SURGICAL SUPPLY — 14 items
CATH ANGIO 5F PIGTAIL 65CM (CATHETERS) ×1 IMPLANT
COVER PRB 48X5XTLSCP FOLD TPE (BAG) IMPLANT
COVER PROBE 5X48 (BAG) ×2
DEVICE CLOSURE PERCLS PRGLD 6F (VASCULAR PRODUCTS) IMPLANT
KIT MICROINTRODUCER STIFF 5F (SHEATH) ×1 IMPLANT
KIT PV (KITS) ×2 IMPLANT
PERCLOSE PROGLIDE 6F (VASCULAR PRODUCTS) ×2
SHEATH PINNACLE 5F 10CM (SHEATH) ×1 IMPLANT
STOPCOCK MORSE 400PSI 3WAY (MISCELLANEOUS) ×1 IMPLANT
SYRINGE MEDRAD AVANTA MACH 7 (SYRINGE) ×1 IMPLANT
TRANSDUCER W/STOPCOCK (MISCELLANEOUS) ×2 IMPLANT
TRAY PV CATH (CUSTOM PROCEDURE TRAY) ×2 IMPLANT
TUBING CIL FLEX 10 FLL-RA (TUBING) ×1 IMPLANT
WIRE HITORQ VERSACORE ST 145CM (WIRE) ×1 IMPLANT

## 2016-08-13 NOTE — Discharge Instructions (Signed)
Femoral Site Care °Refer to this sheet in the next few weeks. These instructions provide you with information about caring for yourself after your procedure. Your health care provider may also give you more specific instructions. Your treatment has been planned according to current medical practices, but problems sometimes occur. Call your health care provider if you have any problems or questions after your procedure. °What can I expect after the procedure? °After your procedure, it is typical to have the following: °· Bruising at the site that usually fades within 1-2 weeks. °· Blood collecting in the tissue (hematoma) that may be painful to the touch. It should usually decrease in size and tenderness within 1-2 weeks. °Follow these instructions at home: °· Take medicines only as directed by your health care provider. °· You may shower 24-48 hours after the procedure or as directed by your health care provider. Remove the bandage (dressing) and gently wash the site with plain soap and water. Pat the area dry with a clean towel. Do not rub the site, because this may cause bleeding. °· Do not take baths, swim, or use a hot tub until your health care provider approves. °· Check your insertion site every day for redness, swelling, or drainage. °· Do not apply powder or lotion to the site. °· Limit use of stairs to twice a day for the first 2-3 days or as directed by your health care provider. °· Do not squat for the first 2-3 days or as directed by your health care provider. °· Do not lift over 10 lb (4.5 kg) for 5 days after your procedure or as directed by your health care provider. °· Ask your health care provider when it is okay to: °¨ Return to work or school. °¨ Resume usual physical activities or sports. °¨ Resume sexual activity. °· Do not drive home if you are discharged the same day as the procedure. Have someone else drive you. °· You may drive 24 hours after the procedure unless otherwise instructed by  your health care provider. °· Do not operate machinery or power tools for 24 hours after the procedure or as directed by your health care provider. °· If your procedure was done as an outpatient procedure, which means that you went home the same day as your procedure, a responsible adult should be with you for the first 24 hours after you arrive home. °· Keep all follow-up visits as directed by your health care provider. This is important. °Contact a health care provider if: °· You have a fever. °· You have chills. °· You have increased bleeding from the site. Hold pressure on the site. °Get help right away if: °· You have unusual pain at the site. °· You have redness, warmth, or swelling at the site. °· You have drainage (other than a small amount of blood on the dressing) from the site. °· The site is bleeding, and the bleeding does not stop after 30 minutes of holding steady pressure on the site. °· Your leg or foot becomes pale, cool, tingly, or numb. °This information is not intended to replace advice given to you by your health care provider. Make sure you discuss any questions you have with your health care provider. °Document Released: 12/11/2013 Document Revised: 09/15/2015 Document Reviewed: 10/27/2013 °Elsevier Interactive Patient Education © 2017 [employer] ° °

## 2016-08-13 NOTE — Progress Notes (Signed)
Up and walked and tolerated well; right groin stable no bleeding or hematoma 

## 2016-08-13 NOTE — H&P (View-Only) (Signed)
08/08/2016 [name]   Aug 28, 1945  [id number]  Primary Physician Mickie Hillier, MD Primary Cardiologist: [name] MD Renae Gloss  HPI:  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 He was referred by Dr. Martinique for peripheral vascular evaluation of lifestyle limiting claudication. He does have a long cardiac history with interventions dating back to 71. He has a 50-pack-year history of tobacco abuse having quit back in November of last year as well as history of hypertension, hyperlipidemia and non-insulin-dependent diabetes. Never had a heart attack or stroke. He has normal LV function by 2-D echo. She does complain of lifestyle limiting claudication. Recent Dopplers revealed ABIs in the 0.6 range with occluded SFAs bilaterally.   Current Outpatient Prescriptions  Medication Sig Dispense Refill  . albuterol (PROVENTIL HFA;VENTOLIN HFA) 108 (90 Base) MCG/ACT inhaler Inhale 2 puffs into the lungs every 6 (six) hours as needed for wheezing. 1 Inhaler 0  . ascorbic acid (VITAMIN C) 500 MG tablet Take 500 mg by mouth 2 (two) times daily.    [name] aspirin 325 MG EC tablet Take 325 mg by mouth daily.    . clotrimazole (LOTRIMIN) 1 % cream Apply 1 application topically 2 (two) times daily.    [name] docusate sodium (COLACE) 100 MG capsule Take 100 mg by mouth 2 (two) times daily.    . flunisolide (NASAREL) 29 MCG/ACT (0.025%) nasal spray Place into the nose. 1 spray in each nostril at bedtime    . hydrochlorothiazide (HYDRODIURIL) 25 MG tablet Take 25 mg by mouth. 1/2 tablet daily    . LORazepam (ATIVAN) 1 MG tablet     . losartan (COZAAR) 25 MG tablet Take 1 tablet (25 mg total) by mouth daily. 90 tablet 3  . mesalamine (ASACOL) 400 MG EC tablet Take 400 mg by mouth 3 (three) times daily.     . metoprolol tartrate (LOPRESSOR) 25 MG tablet Take by mouth. Take 1/2  tablet BID    . nitroGLYCERIN (NITROSTAT) 0.4 MG SL tablet Place 1 tablet (0.4 mg total) under the tongue every 5 (five) minutes as needed. 25 tablet 12  . omeprazole (PRILOSEC) 20 MG capsule Take 20 mg by mouth daily.    [name] PARoxetine (PAXIL) 40 MG tablet Take 40 mg by mouth. Take 1 1/2 tablets at bedtime    . rOPINIRole (REQUIP) 1 MG tablet Take 1 mg by mouth at bedtime.    . simvastatin (ZOCOR) 80 MG tablet Take by mouth at bedtime. One half tablet    . traZODone (DESYREL) 50 MG tablet Take by mouth at bedtime. 2 tablets    . triamcinolone cream (KENALOG) 0.1 % Apply 1 application topically 2 (two) times daily. 60 g 2   No current facility-administered medications for this visit.     Allergies  Allergen Reactions  . Flomax [Tamsulosin Hcl]     High dose   . Morphine And Related     Social History   Social History  . Marital status: Married    Spouse name: N/A  . Number of children: N/A  . Years of education: 1   Occupational History  . Dealer    Social History Main Topics  . Smoking status: Former Smoker    Quit date: 02/22/2016  . Smokeless tobacco: Never Used  . Alcohol use Not on file  . Drug use: Unknown  . Sexual activity:  Not on file   Other Topics Concern  . Not on file   Social History Narrative  . No narrative on file     Review of Systems: General: negative for chills, fever, night sweats or weight changes.  Cardiovascular: negative for chest pain, dyspnea on exertion, edema, orthopnea, palpitations, paroxysmal nocturnal dyspnea or shortness of breath Dermatological: negative for rash Respiratory: negative for cough or wheezing Urologic: negative for hematuria Abdominal: negative for nausea, vomiting, diarrhea, bright red blood per rectum, melena, or hematemesis Neurologic: negative for visual changes, syncope, or dizziness All other systems reviewed and are otherwise negative except as noted above.    Blood pressure 118/64, pulse (!) 54, height  5' 11"  (1.803 m), weight 167 lb 10.6 oz (76.1 kg), SpO2 96 %.  General appearance: alert and no distress Neck: no adenopathy, no JVD, supple, symmetrical, trachea midline, thyroid not enlarged, symmetric, no tenderness/mass/nodules and Soft right carotid bruit Lungs: clear to auscultation bilaterally Heart: regular rate and rhythm, S1, S2 normal, no murmur, click, rub or gallop Extremities: extremities normal, atraumatic, no cyanosis or edema  EKG not performed  today  ASSESSMENT AND PLAN:   PAD (peripheral artery disease) (HCC) History of peripheral arterial disease by history and Doppler studies. He was referred by Dr. Martinique. Recent Dopplers performed 07/05/16 revealed ABIs in the 0.6 range bilaterally occluded SFAs and intact tibial vessels. I'm going to arrange angiography and potential endovascular therapy for lifestyle limiting claudication.      [name] MD FACP,FACC,FAHA, [facility] 08/08/2016 9:16 AM

## 2016-08-13 NOTE — Progress Notes (Signed)
Pt has history of PTSD, states he will react to being woken up violently.  Cath lab notified.

## 2016-08-13 NOTE — Interval H&P Note (Signed)
History and Physical Interval Note:  08/13/2016 1:13 PM  [name]  has presented today for surgery, with the diagnosis of claudication  The various methods of treatment have been discussed with the patient and family. After consideration of risks, benefits and other options for treatment, the patient has consented to  Procedure(s): Lower Extremity Angiography (N/A) as a surgical intervention .  The patient's history has been reviewed, patient examined, no change in status, stable for surgery.  I have reviewed the patient's chart and labs.  Questions were answered to the patient's satisfaction.     Quay Burow

## 2016-08-14 ENCOUNTER — Encounter (HOSPITAL_COMMUNITY): Payer: Self-pay | Admitting: Cardiovascular Disease

## 2016-08-14 ENCOUNTER — Encounter: Payer: Medicare HMO | Admitting: Cardiovascular Disease

## 2016-08-17 DIAGNOSIS — H52209 Unspecified astigmatism, unspecified eye: Secondary | ICD-10-CM | POA: Diagnosis not present

## 2016-08-17 DIAGNOSIS — H5203 Hypermetropia, bilateral: Secondary | ICD-10-CM | POA: Diagnosis not present

## 2016-08-17 DIAGNOSIS — H524 Presbyopia: Secondary | ICD-10-CM | POA: Diagnosis not present

## 2016-08-27 ENCOUNTER — Encounter: Payer: Self-pay | Admitting: Cardiology

## 2016-08-29 ENCOUNTER — Encounter (HOSPITAL_COMMUNITY): Payer: Medicare HMO

## 2016-08-29 ENCOUNTER — Other Ambulatory Visit (HOSPITAL_COMMUNITY): Payer: Self-pay | Admitting: Radiology

## 2016-09-03 ENCOUNTER — Ambulatory Visit (HOSPITAL_COMMUNITY)
Admission: RE | Admit: 2016-09-03 | Discharge: 2016-09-03 | Disposition: A | Discharge status: Home / Self Care | Payer: Medicare HMO | Source: Ambulatory Visit | Attending: Internal Medicine | Admitting: Internal Medicine

## 2016-09-03 DIAGNOSIS — I6523 Occlusion and stenosis of bilateral carotid arteries: Secondary | ICD-10-CM | POA: Diagnosis not present

## 2016-09-03 DIAGNOSIS — R0989 Other specified symptoms and signs involving the circulatory and respiratory systems: Secondary | ICD-10-CM

## 2016-09-09 NOTE — Progress Notes (Signed)
Cardiology Office Note    Date:  09/11/2016   ID:  [name] Jul 17, 1945, MRN [id number]  PCP:  [name], MD  Cardiologist:  Maridee Slape Martinique, MD    History of Present Illness:  [name] is a 71 y.o. male seen for follow up CAD. He has a history of CAD. He has a history of HTN, HLD, and DM.  Reported in note to have had PCI of LAD in past. Patient reports he had stent in 1996 and then balloon angioplasty in 1999. Last myoview in EMR in 2009 showed diaphragmatic attenuation, otherwise normal. Echo in 2009 showed EF 45-50% with septal HK and mild MR.   When seen last he had persistent cough and SOB following the flu. He had been treated with antibiotics. Lisinopril was switched to losartan.  Echo was normal.  He quit smoking in November 2017. Since then he reports his cough is much better and his breathing is OK. She still has some phlegm in his throat after lying down. He does have sinus drainage. He denies any chest pain.   He was noted to have poor pedal pulses and LE arterial dopplers showed severe PAD with bilateral SFA occlusions and left common femoral artery obstruction. He was seen by Dr. Gwenlyn Found and underwent angiography. This demonstrated bilateral SFA occlusions extending into P1 and P2 segments with single vessel peroneal runoff. Medical therapy was recommended. Carotid dopplers were without significant obstruction. He still complains of LE discomfort with walking.      Past Medical History:  Diagnosis Date  . Adenocarcinoma of prostate ([place])   . Anxiety   . CAD (coronary artery disease)   . 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 Crohn's disease ([place])   . CTS (carpal tunnel syndrome)    Mild  . Depression   . Diabetes mellitus without complication (HCC)    Type 2  . Hyperlipidemia   . Hypertension   . Kidney stone   . Optic neuritis     Past Surgical History:  Procedure Laterality Date  . COLONOSCOPY    . KNEE CARTILAGE SURGERY Right   .  LOWER EXTREMITY ANGIOGRAPHY N/A 08/13/2016   Procedure: Lower Extremity Angiography;  Surgeon: [name], MD;  Location: [facility];  Service: Cardiovascular;  Laterality: N/A;  . NASAL SINUS SURGERY    . PROSTATE SURGERY      Current Medications: Outpatient Medications Prior to Visit  Medication Sig Dispense Refill  . albuterol (PROVENTIL HFA;VENTOLIN HFA) 108 (90 Base) MCG/ACT inhaler Inhale 2 puffs into the lungs every 6 (six) hours as needed for wheezing. (Patient taking differently: Inhale 2 puffs into the lungs 2 (two) times daily. ) 1 Inhaler 0  . ascorbic acid (VITAMIN C) 500 MG tablet Take 500 mg by mouth 2 (two) times daily.    [name] aspirin 325 MG EC tablet Take 325 mg by mouth daily.    . clotrimazole (LOTRIMIN) 1 % cream Apply 1 application topically 2 (two) times daily as needed.     . docusate sodium (COLACE) 100 MG capsule Take 100 mg by mouth 2 (two) times daily.    . flunisolide (NASAREL) 29 MCG/ACT (0.025%) nasal spray Place 1 spray into the nose daily as needed for allergies.     . hydrochlorothiazide (HYDRODIURIL) 25 MG tablet Take 12.5 mg by mouth daily.     [name] losartan (COZAAR) 25 MG tablet Take 1 tablet (25 mg total) by mouth daily. 90 tablet  3  . mesalamine (APRISO) 0.375 g 24 hr capsule Take 0.75 mg by mouth 2 (two) times daily.    . metoprolol tartrate (LOPRESSOR) 25 MG tablet Take 12.5 mg by mouth 2 (two) times daily.     . nitroGLYCERIN (NITROSTAT) 0.4 MG SL tablet Place 1 tablet (0.4 mg total) under the tongue every 5 (five) minutes as needed. 25 tablet 12  . omeprazole (PRILOSEC) 20 MG capsule Take 20 mg by mouth daily.    [name] PARoxetine (PAXIL) 40 MG tablet Take 60 mg by mouth at bedtime. Take 1 1/2 tablets at bedtime     . simvastatin (ZOCOR) 80 MG tablet Take 40 mg by mouth at bedtime.     . traZODone (DESYREL) 50 MG tablet Take 50 mg by mouth at bedtime.     . triamcinolone cream (KENALOG) 0.1 % Apply 1 application topically 2 (two) times daily.  (Patient taking differently: Apply 1 application topically 2 (two) times daily as needed. ) 60 g 2   No facility-administered medications prior to visit.      Allergies:   Flomax [tamsulosin hcl] and Morphine and related   Social History   Social History  . Marital status: Married    Spouse name: N/A  . Number of children: N/A  . Years of education: 1   Occupational History  . Dealer    Social History Main Topics  . Smoking status: Former Smoker    Quit date: 02/22/2016  . Smokeless tobacco: Never Used  . Alcohol use No  . Drug use: No  . Sexual activity: Not Asked   Other Topics Concern  . None   Social History Narrative  . None     Family History:  The patient's family history includes Colon cancer in his mother; Coronary artery disease in his father; Crohn's disease in his brother; Heart attack in his father; Hypertension in his mother and sister.   ROS:   Please see the history of present illness.    ROS All other systems reviewed and are negative.   PHYSICAL EXAM:   VS:  BP 112/62   Pulse 68   Ht 5' 11"  (1.803 m)   Wt 169 lb 9.6 oz (76.9 kg)   BMI 23.65 kg/m    GEN: Elderly, well developed, in no acute distress, coughing HEENT: normal  Neck: no JVD, carotid bruits, or masses Cardiac: RRR; no murmurs, rubs, or gallops,no edema. Pedal pulses barely palpable. No bruits. Respiratory:  Scant wheezing, normal work of breathing GI: soft, nontender, nondistended, + BS MS: no deformity or atrophy  Skin: warm and dry, no rash Neuro:  Alert and Oriented x 3, Strength and sensation are intact Psych: euthymic mood, full affect  Wt Readings from Last 3 Encounters:  09/11/16 169 lb 9.6 oz (76.9 kg)  08/13/16 167 lb (75.8 kg)  08/10/16 168 lb 3.2 oz (76.3 kg)      Studies/Labs Reviewed:   EKG:  EKG is not ordered today.      Labs: 06/08/2016: ALT 93 08/08/2016: BUN 24; Creat 1.21; Hemoglobin 12.8; Platelets 202; Potassium 3.9; Sodium 140   Lipid Panel      Component Value Date/Time   CHOL 148 06/08/2016 0818   TRIG 94 06/08/2016 0818   HDL 46 06/08/2016 0818   CHOLHDL 3.2 06/08/2016 0818   LDLCALC 83 06/08/2016 0818    Additional studies/ records that were reviewed today include:  Echo 07/17/16: Study Conclusions  - Left ventricle: The cavity size was normal.  Wall thickness was   normal. Systolic function was normal. The estimated ejection   fraction was in the range of 55% to 60%. Wall motion was normal;   there were no regional wall motion abnormalities. Features are   consistent with a pseudonormal left ventricular filling pattern,   with concomitant abnormal relaxation and increased filling   pressure (grade 2 diastolic dysfunction). - Aortic valve: There was no stenosis. There was trivial   regurgitation. - Mitral valve: Mildly calcified annulus. There was no significant   regurgitation. - Right ventricle: The cavity size was normal. Systolic function   was normal. - Tricuspid valve: Peak RV-RA gradient (S): 26 mm Hg. - Pulmonary arteries: PA peak pressure: 29 mm Hg (S). - Inferior vena cava: The vessel was normal in size. The   respirophasic diameter changes were in the normal range (>= 50%),   consistent with normal central venous pressure.  Impressions:  - Normal LV size with EF 55-60%. Moderate diastolic dysfunction.   Normal RV size and systolic function. No significant valvular   abnormalities.  LE angiogram: Angiographic Data:   1: Abdominal aortogram-the abdominal aorta was fluoroscopically calcified but free of aneurysmal or obstructive disease. 2: Left lower extremity-the left SFA was occluded at its origin reconstituting in the adductor canal by profunda femoris collaterals. There were sequential 90% stenoses in the P1 and P2 segments with 1 vessel runoff via the Peroneal 3: Right lower extremity-total SFA the origin reconstituting in the adductor canal by profunda femoris collaterals. There were tandem 90%  stenoses in the P1 and P2 segments with 1 vessel runoff via the peroneal   IMPRESSION: Mr. Tolliver has occluded SFAs at the origin reconstituting in the adductor canal with 1 vessel runoff via the peroneal arteries. I do not think he is an endovascular nor his surgical candidate. Local therapy will be recommended. A right common femoral antrum was performed in the right common femoral artery puncture site was then hemostatically sealed using a Perclose device successfully. The patient left the lab in stable condition.  Carotid dopplers: mild R ICA stenosis.   ASSESSMENT:    1. Coronary artery disease due to calcified coronary lesion   2. Claudication ([place])   3. PAD (peripheral artery disease) ([place])   4. Essential hypertension   5. Hypercholesterolemia      PLAN:  In order of problems listed above:  1. Patient is asymptomatic. Continue medical therapy 2. Severe PAD as noted above. He wanted to know if there was anything we could do to help his claudication. We discussed Pletal but I told him the most effective therapy is a regular walking program. He wants to try this first before taking a pill. 3. BP is well controlled on current medications. 4. Cholesterol is OK on Zocor. Continue Rx.   Medication Adjustments/Labs and Tests Ordered: Current medicines are reviewed at length with the patient today.  Concerns regarding medicines are outlined above.  Medication changes, Labs and Tests ordered today are listed in the Patient Instructions below. Patient Instructions  Continue your current therapy  Work on walking daily  I will see you in 6 months.      Signed, Riley Hallum Martinique, MD  09/11/2016 1:00 PM    [facility] [address] [place], [place], [address] [phone]

## 2016-09-11 ENCOUNTER — Ambulatory Visit (INDEPENDENT_AMBULATORY_CARE_PROVIDER_SITE_OTHER): Payer: Medicare HMO | Admitting: Cardiology

## 2016-09-11 ENCOUNTER — Encounter: Payer: Self-pay | Admitting: Cardiology

## 2016-09-11 VITALS — BP 112/62 | HR 68 | Ht 71.0 in | Wt 169.6 lb

## 2016-09-11 DIAGNOSIS — I2510 Atherosclerotic heart disease of native coronary artery without angina pectoris: Secondary | ICD-10-CM

## 2016-09-11 DIAGNOSIS — I739 Peripheral vascular disease, unspecified: Secondary | ICD-10-CM | POA: Diagnosis not present

## 2016-09-11 DIAGNOSIS — I10 Essential (primary) hypertension: Secondary | ICD-10-CM | POA: Diagnosis not present

## 2016-09-11 DIAGNOSIS — I2584 Coronary atherosclerosis due to calcified coronary lesion: Secondary | ICD-10-CM

## 2016-09-11 DIAGNOSIS — E7800 Pure hypercholesterolemia, unspecified: Secondary | ICD-10-CM

## 2016-09-11 NOTE — Patient Instructions (Signed)
Continue your current therapy  Work on walking daily  I will see you in 6 months.

## 2016-09-13 ENCOUNTER — Other Ambulatory Visit: Payer: Self-pay | Admitting: Cardiovascular Disease

## 2016-09-13 DIAGNOSIS — R0989 Other specified symptoms and signs involving the circulatory and respiratory systems: Secondary | ICD-10-CM

## 2016-10-12 ENCOUNTER — Encounter: Payer: Medicare HMO | Admitting: Internal Medicine

## 2016-10-17 ENCOUNTER — Encounter: Payer: Self-pay | Admitting: Family Medicine

## 2016-10-17 ENCOUNTER — Ambulatory Visit (INDEPENDENT_AMBULATORY_CARE_PROVIDER_SITE_OTHER): Payer: Medicare HMO | Admitting: Family Medicine

## 2016-10-17 VITALS — BP 128/78 | Ht 71.0 in | Wt 171.6 lb

## 2016-10-17 DIAGNOSIS — I10 Essential (primary) hypertension: Secondary | ICD-10-CM

## 2016-10-17 DIAGNOSIS — E119 Type 2 diabetes mellitus without complications: Secondary | ICD-10-CM

## 2016-10-17 DIAGNOSIS — E785 Hyperlipidemia, unspecified: Secondary | ICD-10-CM

## 2016-10-17 LAB — POCT GLYCOSYLATED HEMOGLOBIN (HGB A1C): HEMOGLOBIN A1C: 7.4

## 2016-10-17 MED ORDER — METFORMIN HCL 500 MG PO TABS: 500.0000 mg | ORAL_TABLET | Freq: Two times a day (BID) | ORAL | 1 refills | Status: DC

## 2016-10-17 NOTE — Progress Notes (Signed)
Subjective:    Patient ID: [name], male    DOB: 06/22/1945, 71 y.o.   MRN: [id number]  Hypertension  This is a chronic problem. The current episode started more than 1 year ago. Risk factors for coronary artery disease include male gender and sedentary lifestyle. Treatments tried: hctz, metoprolol, losartan. There are no compliance problems.    Results for orders placed or performed in visit on 99/24/26  BASIC METABOLIC PANEL WITH GFR  Result Value Ref Range   Sodium 140 135 - 146 mmol/L   Potassium 3.9 3.5 - 5.3 mmol/L   Chloride 104 98 - 110 mmol/L   CO2 23 20 - 31 mmol/L   Glucose, Bld 126 (H) 65 - 99 mg/dL   BUN 24 7 - 25 mg/dL   Creat 1.21 (H) 0.70 - 1.18 mg/dL   Calcium 9.5 8.6 - 10.3 mg/dL   GFR, Est African American 70 >=60 mL/min   GFR, Est Non African American 60 >=60 mL/min  CBC with Differential/Platelet  Result Value Ref Range   WBC 8.6 3.8 - 10.8 K/uL   RBC 4.04 (L) 4.20 - 5.80 MIL/uL   Hemoglobin 12.8 (L) 13.2 - 17.1 g/dL   HCT 39.1 38.5 - 50.0 %   MCV 96.8 80.0 - 100.0 fL   MCH 31.7 27.0 - 33.0 pg   MCHC 32.7 32.0 - 36.0 g/dL   RDW 13.7 11.0 - 15.0 %   Platelets 202 140 - 400 K/uL   MPV 10.6 7.5 - 12.5 fL   Neutro Abs 6,278 1,500 - 7,800 cells/uL   Lymphs Abs 1,634 850 - 3,900 cells/uL   Monocytes Absolute 602 200 - 950 cells/uL   Eosinophils Absolute 86 15 - 500 cells/uL   Basophils Absolute 0 0 - 200 cells/uL   Neutrophils Relative % 73 %   Lymphocytes Relative 19 %   Monocytes Relative 7 %   Eosinophils Relative 1 %   Basophils Relative 0 %   Smear Review Criteria for review not met   Protime-INR  Result Value Ref Range   Prothrombin Time 10.4 9.0 - 11.5 sec   INR 1.0   APTT  Result Value Ref Range   aPTT 30 22 - 34 sec   Blood pressure medicine and blood pressure levels reviewed today with patient. Compliant with blood pressure medicine. States does not miss a dose. No obvious side effects. Blood pressure generally good when  checked elsewhere. Watching salt intake.   Patient claims compliance with diabetes medication. No obvious side effects. Reports no substantial low sugar spells. Most numbers are generally in good range when checked fasting. Generally does not miss a dose of medication. Watching diabetic diet closely  Patient continues to take lipid medication regularly. No obvious side effects from it. Generally does not miss a dose. Prior blood work results are reviewed with patient. Patient continues to work on fat intake in diet    Review of Systems No headache, no major weight loss or weight gain, no chest pain no back pain abdominal pain no change in bowel habits complete ROS otherwise negative     Objective:   Physical Exam   Alert and oriented, vitals reviewed and stable, NAD ENT-TM's and ext canals WNL bilat via otoscopic exam Soft palate, tonsils and post pharynx WNL via oropharyngeal exam Neck-symmetric, no masses; thyroid nonpalpable and nontender Pulmonary-no tachypnea or accessory muscle use; Clear without wheezes via auscultation Card--no abnrml murmurs, rhythm reg and rate WNL Carotid pulses symmetric, without  bruits      Assessment & Plan:  Impression 1 type 2 diabetes suboptimum control discussed times initiate reinitiate medication #2 hypertension good control discussed maintain same meds #3 hyperlipidemia with ongoing need for meds meds encourage #4 mental status issues ongoing. Patient advised to follow-up with his regular clinician interactions for this at the [employer] exercise discussed meds refilled recheck in 4 months A1c then

## 2016-11-30 ENCOUNTER — Ambulatory Visit (INDEPENDENT_AMBULATORY_CARE_PROVIDER_SITE_OTHER): Payer: Medicare HMO | Admitting: Family Medicine

## 2016-11-30 ENCOUNTER — Encounter: Payer: Self-pay | Admitting: Family Medicine

## 2016-11-30 VITALS — BP 122/62 | Ht 71.0 in | Wt 166.0 lb

## 2016-11-30 DIAGNOSIS — E119 Type 2 diabetes mellitus without complications: Secondary | ICD-10-CM

## 2016-11-30 DIAGNOSIS — C61 Malignant neoplasm of prostate: Secondary | ICD-10-CM | POA: Diagnosis not present

## 2016-11-30 NOTE — Progress Notes (Signed)
   Subjective:    Patient ID: [name], male    DOB: [birth year], 71 y.o.   MRN: [id number]  Diabetes  He presents for his follow-up diabetic visit. He has type 2 diabetes mellitus.  Patient states he came in to discuss his diabetes. He had an A1c done June 27,2018 and it was at 7.4 at that time. It is too early for another A1c drawal. Patient is on Metformin 500 mg takes one BID. States he was concerned about his blood sugars and knew it was a little early for a DM check up, but wanted to discuss before his next ov. No other concerns.  At first had low spells, but was skipping meals  Now sticking ewith the pill  amnd eating reg   Pt now feeling better  Patient originally scheduled appointment because he was having low sugar spells. He at first thought the medicine was too strong for him. Then he discovered that this occurred primarily when he skip meals. No longer skipping meals. And now things are doing better for him.  overa;;  Review of Systems No headache, no major weight loss or weight gain, no chest pain no back pain abdominal pain no change in bowel habits complete ROS otherwise negative     Objective:   Physical Exam   Alert vitals stable, NAD. Blood pressure good on repeat. HEENT normal. Lungs clear. Heart regular rate and rhythm.      Assessment & Plan:  Impression type 2 diabetes. Control improving. Fasting sugars mostly 110s and 120s. No longer low sugar spells now the patient is changes dietary habits. Concerns discussed  Greater than 50% of this 15 minute face to face visit was spent in counseling and discussion and coordination of care regarding the above diagnosis/diagnosies

## 2017-01-30 LAB — HM DIABETES EYE EXAM

## 2017-02-18 ENCOUNTER — Encounter: Payer: Self-pay | Admitting: Family Medicine

## 2017-02-18 ENCOUNTER — Ambulatory Visit (INDEPENDENT_AMBULATORY_CARE_PROVIDER_SITE_OTHER): Payer: Medicare HMO | Admitting: Family Medicine

## 2017-02-18 VITALS — BP 158/78 | Ht 71.0 in | Wt 170.0 lb

## 2017-02-18 DIAGNOSIS — C61 Malignant neoplasm of prostate: Secondary | ICD-10-CM | POA: Diagnosis not present

## 2017-02-18 DIAGNOSIS — E785 Hyperlipidemia, unspecified: Secondary | ICD-10-CM | POA: Diagnosis not present

## 2017-02-18 DIAGNOSIS — I10 Essential (primary) hypertension: Secondary | ICD-10-CM | POA: Diagnosis not present

## 2017-02-18 DIAGNOSIS — E119 Type 2 diabetes mellitus without complications: Secondary | ICD-10-CM

## 2017-02-18 LAB — POCT GLYCOSYLATED HEMOGLOBIN (HGB A1C): HEMOGLOBIN A1C: 5.1

## 2017-02-18 NOTE — Progress Notes (Signed)
   Subjective:    Patient ID: [name], male    DOB: [birth year], 71 y.o.   MRN: [id number] Patient arrives office with numerous concerns Diabetes    Patient claims compliance with diabetes medication. No obvious side effects. Reports no substantial low sugar spells. Most numbers are generally in good range when checked fasting. Generally does not miss a dose of medication. Watching diabetic diet closely  Blood pressure medicine and blood pressure levels reviewed today with patient. Compliant with blood pressure medicine. States does not miss a dose. No obvious side effects. Blood pressure generally good when checked elsewhere. Watching salt intake.   Patient continues to take lipid medication regularly. No obvious side effects from it. Generally does not miss a dose. Prior blood work results are reviewed with patient. Patient continues to work on fat intake in diet   Patient here today to follow up on DM II. He states he is on Metformin 500 mg one BID. He does not eat healthy,and does not get much exercise. He sees Dr. Hassell Done @ [facility] for his eyes. He does not see any one for feet. GAD and PHQ 9 given to pt to fill out. Review of Systems     Results for orders placed or performed in visit on 02/18/17  POCT glycosylated hemoglobin (Hb A1C)  Result Value Ref Range   Hemoglobin A1C 5.1    No headache, no major weight loss or weight gain, no chest pain no back pain abdominal pain no change in bowel habits complete ROS otherwise negative   Objective:   Physical Exam  Alert and oriented, vitals reviewed and stable, NAD ENT-TM's and ext canals WNL bilat via otoscopic exam Soft palate, tonsils and post pharynx WNL via oropharyngeal exam Neck-symmetric, no masses; thyroid nonpalpable and nontender Pulmonary-no tachypnea or accessory muscle use; Clear without wheezes via auscultation Card--no abnrml murmurs, rhythm reg and rate WNL Carotid pulses symmetric, without  bruits       Assessment & Plan:  Impression 1 type 2 diabetes excellent control discussed to maintain same #2 hypertension great control discussed.  Blood pressure repeat excellent.  To maintain same meds and compliance discussed  3.  Hyperlipidemia.  Prior blood work reviewed.  Patient to bring in new blood work from the [place].  Results reviewed to maintain  4.  Depression ongoing and significant.  Patient sees a psychiatrist very regularly.  No suicidal thoughts.  5.  History of prostate cancer encouraged to follow-up with urology specialist  Medications refilled.  Diet discussed exercise discussed.  Follow-up in 6 months as scheduled wellness plus chronic pain

## 2017-03-04 ENCOUNTER — Encounter: Payer: Self-pay | Admitting: Family Medicine

## 2017-04-07 ENCOUNTER — Other Ambulatory Visit: Payer: Self-pay | Admitting: Family Medicine

## 2017-04-18 ENCOUNTER — Telehealth: Payer: Self-pay | Admitting: Family Medicine

## 2017-04-18 MED ORDER — METFORMIN HCL 500 MG PO TABS: 500.0000 mg | ORAL_TABLET | Freq: Two times a day (BID) | ORAL | 0 refills | Status: DC

## 2017-04-18 NOTE — Telephone Encounter (Signed)
Prescription sent electronically to pharmacy. Patient notified. 

## 2017-04-18 NOTE — Telephone Encounter (Signed)
Pt is requesting a refill on metFORMIN (GLUCOPHAGE) 500 MG tablet    [facility]

## 2017-06-04 ENCOUNTER — Ambulatory Visit (INDEPENDENT_AMBULATORY_CARE_PROVIDER_SITE_OTHER): Payer: Medicare HMO | Admitting: Family Medicine

## 2017-06-04 ENCOUNTER — Encounter: Payer: Self-pay | Admitting: Family Medicine

## 2017-06-04 VITALS — BP 130/78 | Temp 98.5°F | Ht 71.0 in | Wt 175.0 lb

## 2017-06-04 DIAGNOSIS — J4521 Mild intermittent asthma with (acute) exacerbation: Secondary | ICD-10-CM | POA: Diagnosis not present

## 2017-06-04 DIAGNOSIS — J329 Chronic sinusitis, unspecified: Secondary | ICD-10-CM | POA: Diagnosis not present

## 2017-06-04 MED ORDER — CEFPROZIL 500 MG PO TABS: 500.0000 mg | ORAL_TABLET | Freq: Two times a day (BID) | ORAL | 0 refills | Status: DC

## 2017-06-04 MED ORDER — ALBUTEROL SULFATE HFA 108 (90 BASE) MCG/ACT IN AERS: 2.0000 | INHALATION_SPRAY | Freq: Four times a day (QID) | RESPIRATORY_TRACT | 0 refills | Status: DC | PRN

## 2017-06-04 MED ORDER — PREDNISONE 10 MG PO TABS: ORAL_TABLET | ORAL | 0 refills | Status: DC

## 2017-06-04 NOTE — Progress Notes (Signed)
   Subjective:    Patient ID: [name], male    DOB: 1946-04-09, 72 y.o.   MRN: [id number]  Sinusitis  This is a new problem. Episode onset: one and a half weeks. Associated symptoms include chills, congestion, coughing and a sore throat. (Fever) Treatments tried: otc meds.   Runny nose and cough and congestion  Pt noting chillls and achey   some prodcutive wioth a cough , feels hung up a bi t cough productive at times Wheeziness intermittently responds to   Bilateral foot swelling. Started 3 -4 days ago. More swollen the past week, admits to quite a bit of salt intake the past  More active ately   Review of Systems  Constitutional: Positive for chills.  HENT: Positive for congestion and sore throat.   Respiratory: Positive for cough.        Objective:   Physical Exam  Alert, mild malaise. Hydration good Vitals stable. frontal/ maxillary tenderness evident positive nasal congestion. pharynx normal neck supple  lungs clear/no crackles positive significant wheezes. heart regular in rhythm  Ankles feet slight edema bilateral.  Arterial flow is consistent with known PAD     Assessment & Plan:  Impression rhinosinusitis bronchitis with substantial reactive airways likely post viral, discussed with patient. plan antibiotics prescribed. Questions answered. Symptomatic care discussed. warning signs discussed. WSL Add albuterol as needed.  Symptom care discussed prednisone taper also

## 2017-06-19 ENCOUNTER — Encounter: Payer: Self-pay | Admitting: Internal Medicine

## 2017-07-15 ENCOUNTER — Telehealth: Payer: Self-pay | Admitting: Family Medicine

## 2017-07-15 ENCOUNTER — Other Ambulatory Visit: Payer: Self-pay | Admitting: *Deleted

## 2017-07-15 MED ORDER — GLIPIZIDE 5 MG PO TABS: 5.0000 mg | ORAL_TABLET | Freq: Every day | ORAL | 5 refills | Status: DC

## 2017-07-15 NOTE — Telephone Encounter (Signed)
Last diabetic check up oct 2018

## 2017-07-15 NOTE — Telephone Encounter (Signed)
Discussed with pt's wife. Wife verbalized understanding.  Med sent to pharm.

## 2017-07-15 NOTE — Telephone Encounter (Signed)
The night before last, patients sugar was running 588.  They got it to come down to 300, then 288.  This morning it was running 279.  He is taking the Metformin 2 times a day that Dr. Richardson Landry prescribed, but patients spouse, [name], would like to know if there is anything else they can do to get this to come down?  There is a DPR on file to be able to speak with Marrian Salvage.  [facility]

## 2017-07-15 NOTE — Telephone Encounter (Signed)
Add glipizide five mg qam, numb 30, six mo ref. All [place] next wk with fasting numbers, b sure to f u as re last visit

## 2017-08-22 DIAGNOSIS — C61 Malignant neoplasm of prostate: Secondary | ICD-10-CM | POA: Diagnosis not present

## 2017-08-28 ENCOUNTER — Ambulatory Visit (INDEPENDENT_AMBULATORY_CARE_PROVIDER_SITE_OTHER): Payer: Medicare HMO | Admitting: Family Medicine

## 2017-08-28 ENCOUNTER — Encounter: Payer: Self-pay | Admitting: Family Medicine

## 2017-08-28 VITALS — BP 130/86 | Ht 71.0 in | Wt 171.4 lb

## 2017-08-28 DIAGNOSIS — E119 Type 2 diabetes mellitus without complications: Secondary | ICD-10-CM | POA: Diagnosis not present

## 2017-08-28 DIAGNOSIS — Z79899 Other long term (current) drug therapy: Secondary | ICD-10-CM | POA: Diagnosis not present

## 2017-08-28 DIAGNOSIS — Z13220 Encounter for screening for lipoid disorders: Secondary | ICD-10-CM | POA: Diagnosis not present

## 2017-08-28 LAB — POCT GLYCOSYLATED HEMOGLOBIN (HGB A1C): Hemoglobin A1C: 7.2

## 2017-08-28 MED ORDER — METFORMIN HCL 500 MG PO TABS: 500.0000 mg | ORAL_TABLET | Freq: Two times a day (BID) | ORAL | 1 refills | Status: DC

## 2017-08-28 MED ORDER — GLIPIZIDE 5 MG PO TABS: 5.0000 mg | ORAL_TABLET | Freq: Every day | ORAL | 5 refills | Status: DC

## 2017-08-28 NOTE — Progress Notes (Signed)
   Subjective:    Patient ID: [name], male    DOB: [birth year], 72 y.o.   MRN: [id number]  Diabetes  He presents for his follow-up diabetic visit. He has type 2 diabetes mellitus. He is compliant with treatment all of the time. Home blood sugar record trend: under 200 most of the time. He does not see a podiatrist.Eye exam is not current.   [facility]. Next appointment in July.   Results for orders placed or performed in visit on 08/28/17  POCT glycosylated hemoglobin (Hb A1C)  Result Value Ref Range   Hemoglobin A1C 7.2    Patient claims compliance with diabetes medication. No obvious side effects. Reports no substantial low sugar spells. Most numbers are generally in good range when checked fasting. Generally does not miss a dose of medication. Watching diabetic diet closely   Blood pressure medicine and blood pressure levels reviewed today with patient. Compliant with blood pressure medicine. States does not miss a dose. No obvious side effects. Blood pressure generally good when checked elsewhere. Watching salt intake.   Patient continues to take lipid medication regularly. No obvious side effects from it. Generally does not miss a dose. Prior blood work results are reviewed with patient. Patient continues to work on fat intake in diet    Review of Systems No headache, no major weight loss or weight gain, no chest pain no back pain abdominal pain no change in bowel habits complete ROS otherwise negative     Objective:   Physical Exam  Alert and oriented, vitals reviewed and stable, NAD ENT-TM's and ext canals WNL bilat via otoscopic exam Soft palate, tonsils and post pharynx WNL via oropharyngeal exam Neck-symmetric, no masses; thyroid nonpalpable and nontender Pulmonary-no tachypnea or accessory muscle use; Clear without wheezes via auscultation Card--no abnrml murmurs, rhythm reg and rate WNL Carotid pulses symmetric, without  bruits       Assessment & Plan:  1 impression type 2 diabetes.  Control good.  Medication compliance discussed to maintain same meds  2.  Hypertension good control discussed compliance discussed to maintain same meds  3.  Hyperlipidemia.  Prior blood work good control discussed need to reassess blood work rationale discussed  4.  Chronic depression/psychiatric challenges followed by the [place] [facility] for this.  Appropriate blood work medications refilled diet exercise discussed recheck in 6 months

## 2017-09-03 ENCOUNTER — Ambulatory Visit: Payer: Medicare HMO | Admitting: Urology

## 2017-09-03 DIAGNOSIS — Z13220 Encounter for screening for lipoid disorders: Secondary | ICD-10-CM | POA: Diagnosis not present

## 2017-09-03 DIAGNOSIS — Z8546 Personal history of malignant neoplasm of prostate: Secondary | ICD-10-CM | POA: Diagnosis not present

## 2017-09-03 DIAGNOSIS — Z79899 Other long term (current) drug therapy: Secondary | ICD-10-CM | POA: Diagnosis not present

## 2017-09-03 DIAGNOSIS — E119 Type 2 diabetes mellitus without complications: Secondary | ICD-10-CM | POA: Diagnosis not present

## 2017-09-04 LAB — BASIC METABOLIC PANEL
BUN/Creatinine Ratio: 17 (ref 10–24)
BUN: 25 mg/dL (ref 8–27)
CHLORIDE: 101 mmol/L (ref 96–106)
CO2: 19 mmol/L — ABNORMAL LOW (ref 20–29)
Calcium: 9.7 mg/dL (ref 8.6–10.2)
Creatinine, Ser: 1.45 mg/dL — ABNORMAL HIGH (ref 0.76–1.27)
GFR, EST AFRICAN AMERICAN: 56 mL/min/{1.73_m2} — AB (ref >59)
GFR, EST NON AFRICAN AMERICAN: 48 mL/min/{1.73_m2} — AB (ref >59)
Glucose: 96 mg/dL (ref 65–99)
POTASSIUM: 4 mmol/L (ref 3.5–5.2)
SODIUM: 140 mmol/L (ref 134–144)

## 2017-09-04 LAB — LIPID PANEL
CHOLESTEROL TOTAL: 178 mg/dL (ref 100–199)
Chol/HDL Ratio: 3.5 ratio (ref 0.0–5.0)
HDL: 51 mg/dL (ref >39)
LDL CALC: 114 mg/dL — AB (ref 0–99)
Triglycerides: 64 mg/dL (ref 0–149)
VLDL Cholesterol Cal: 13 mg/dL (ref 5–40)

## 2017-09-04 LAB — HEPATIC FUNCTION PANEL
ALT: 22 IU/L (ref 0–44)
AST: 17 IU/L (ref 0–40)
Albumin: 4.4 g/dL (ref 3.5–4.8)
Alkaline Phosphatase: 97 IU/L (ref 39–117)
BILIRUBIN TOTAL: 0.4 mg/dL (ref 0.0–1.2)
Bilirubin, Direct: 0.16 mg/dL (ref 0.00–0.40)
Total Protein: 7.3 g/dL (ref 6.0–8.5)

## 2017-09-09 MED ORDER — SIMVASTATIN 80 MG PO TABS: 80.0000 mg | ORAL_TABLET | Freq: Every day | ORAL | 5 refills | Status: DC

## 2017-09-09 NOTE — Addendum Note (Signed)
Addended by: Dairl Ponder on: 09/09/2017 01:31 PM   Modules accepted: Orders

## 2018-03-26 ENCOUNTER — Observation Stay (HOSPITAL_BASED_OUTPATIENT_CLINIC_OR_DEPARTMENT_OTHER): Payer: Medicare HMO

## 2018-03-26 ENCOUNTER — Inpatient Hospital Stay (HOSPITAL_COMMUNITY)
Admission: EM | Admit: 2018-03-26 | Discharge: 2018-04-09 | DRG: 234 | Disposition: A | Discharge status: Home Health | Payer: Medicare HMO | Attending: Cardiothoracic Surgery | Admitting: Cardiothoracic Surgery

## 2018-03-26 ENCOUNTER — Emergency Department (HOSPITAL_COMMUNITY): Payer: Medicare HMO

## 2018-03-26 ENCOUNTER — Encounter (HOSPITAL_COMMUNITY): Payer: Self-pay | Admitting: Emergency Medicine

## 2018-03-26 ENCOUNTER — Other Ambulatory Visit: Payer: Self-pay

## 2018-03-26 DIAGNOSIS — E7800 Pure hypercholesterolemia, unspecified: Secondary | ICD-10-CM | POA: Diagnosis present

## 2018-03-26 DIAGNOSIS — N183 Chronic kidney disease, stage 3 (moderate): Secondary | ICD-10-CM | POA: Diagnosis present

## 2018-03-26 DIAGNOSIS — E782 Mixed hyperlipidemia: Secondary | ICD-10-CM | POA: Diagnosis present

## 2018-03-26 DIAGNOSIS — Z885 Allergy status to narcotic agent status: Secondary | ICD-10-CM

## 2018-03-26 DIAGNOSIS — F341 Dysthymic disorder: Secondary | ICD-10-CM | POA: Diagnosis not present

## 2018-03-26 DIAGNOSIS — I739 Peripheral vascular disease, unspecified: Secondary | ICD-10-CM | POA: Diagnosis not present

## 2018-03-26 DIAGNOSIS — E8770 Fluid overload, unspecified: Secondary | ICD-10-CM | POA: Diagnosis not present

## 2018-03-26 DIAGNOSIS — F329 Major depressive disorder, single episode, unspecified: Secondary | ICD-10-CM | POA: Diagnosis present

## 2018-03-26 DIAGNOSIS — Z800 Family history of malignant neoplasm of digestive organs: Secondary | ICD-10-CM

## 2018-03-26 DIAGNOSIS — T82855A Stenosis of coronary artery stent, initial encounter: Secondary | ICD-10-CM | POA: Diagnosis present

## 2018-03-26 DIAGNOSIS — I2510 Atherosclerotic heart disease of native coronary artery without angina pectoris: Secondary | ICD-10-CM | POA: Diagnosis not present

## 2018-03-26 DIAGNOSIS — Z452 Encounter for adjustment and management of vascular access device: Secondary | ICD-10-CM | POA: Diagnosis not present

## 2018-03-26 DIAGNOSIS — Z8249 Family history of ischemic heart disease and other diseases of the circulatory system: Secondary | ICD-10-CM | POA: Diagnosis not present

## 2018-03-26 DIAGNOSIS — R0600 Dyspnea, unspecified: Secondary | ICD-10-CM | POA: Diagnosis not present

## 2018-03-26 DIAGNOSIS — Y831 Surgical operation with implant of artificial internal device as the cause of abnormal reaction of the patient, or of later complication, without mention of misadventure at the time of the procedure: Secondary | ICD-10-CM | POA: Diagnosis present

## 2018-03-26 DIAGNOSIS — Z888 Allergy status to other drugs, medicaments and biological substances status: Secondary | ICD-10-CM | POA: Diagnosis not present

## 2018-03-26 DIAGNOSIS — J9811 Atelectasis: Secondary | ICD-10-CM

## 2018-03-26 DIAGNOSIS — D62 Acute posthemorrhagic anemia: Secondary | ICD-10-CM | POA: Diagnosis not present

## 2018-03-26 DIAGNOSIS — Z720 Tobacco use: Secondary | ICD-10-CM | POA: Diagnosis present

## 2018-03-26 DIAGNOSIS — E1151 Type 2 diabetes mellitus with diabetic peripheral angiopathy without gangrene: Secondary | ICD-10-CM | POA: Diagnosis present

## 2018-03-26 DIAGNOSIS — R0789 Other chest pain: Secondary | ICD-10-CM | POA: Diagnosis not present

## 2018-03-26 DIAGNOSIS — R079 Chest pain, unspecified: Secondary | ICD-10-CM | POA: Diagnosis present

## 2018-03-26 DIAGNOSIS — D6959 Other secondary thrombocytopenia: Secondary | ICD-10-CM | POA: Diagnosis present

## 2018-03-26 DIAGNOSIS — K520 Gastroenteritis and colitis due to radiation: Secondary | ICD-10-CM | POA: Diagnosis present

## 2018-03-26 DIAGNOSIS — E118 Type 2 diabetes mellitus with unspecified complications: Secondary | ICD-10-CM | POA: Diagnosis not present

## 2018-03-26 DIAGNOSIS — J90 Pleural effusion, not elsewhere classified: Secondary | ICD-10-CM | POA: Diagnosis present

## 2018-03-26 DIAGNOSIS — Z4682 Encounter for fitting and adjustment of non-vascular catheter: Secondary | ICD-10-CM | POA: Diagnosis not present

## 2018-03-26 DIAGNOSIS — I4891 Unspecified atrial fibrillation: Secondary | ICD-10-CM | POA: Diagnosis not present

## 2018-03-26 DIAGNOSIS — F419 Anxiety disorder, unspecified: Secondary | ICD-10-CM | POA: Diagnosis present

## 2018-03-26 DIAGNOSIS — D689 Coagulation defect, unspecified: Secondary | ICD-10-CM | POA: Diagnosis present

## 2018-03-26 DIAGNOSIS — F17210 Nicotine dependence, cigarettes, uncomplicated: Secondary | ICD-10-CM | POA: Diagnosis present

## 2018-03-26 DIAGNOSIS — Z7984 Long term (current) use of oral hypoglycemic drugs: Secondary | ICD-10-CM

## 2018-03-26 DIAGNOSIS — I959 Hypotension, unspecified: Secondary | ICD-10-CM | POA: Diagnosis not present

## 2018-03-26 DIAGNOSIS — I200 Unstable angina: Secondary | ICD-10-CM | POA: Diagnosis not present

## 2018-03-26 DIAGNOSIS — K5090 Crohn's disease, unspecified, without complications: Secondary | ICD-10-CM | POA: Diagnosis present

## 2018-03-26 DIAGNOSIS — Z8546 Personal history of malignant neoplasm of prostate: Secondary | ICD-10-CM | POA: Diagnosis not present

## 2018-03-26 DIAGNOSIS — Z955 Presence of coronary angioplasty implant and graft: Secondary | ICD-10-CM

## 2018-03-26 DIAGNOSIS — I440 Atrioventricular block, first degree: Secondary | ICD-10-CM | POA: Diagnosis present

## 2018-03-26 DIAGNOSIS — I10 Essential (primary) hypertension: Secondary | ICD-10-CM | POA: Diagnosis present

## 2018-03-26 DIAGNOSIS — I70202 Unspecified atherosclerosis of native arteries of extremities, left leg: Secondary | ICD-10-CM | POA: Diagnosis present

## 2018-03-26 DIAGNOSIS — I129 Hypertensive chronic kidney disease with stage 1 through stage 4 chronic kidney disease, or unspecified chronic kidney disease: Secondary | ICD-10-CM | POA: Diagnosis present

## 2018-03-26 DIAGNOSIS — E871 Hypo-osmolality and hyponatremia: Secondary | ICD-10-CM | POA: Diagnosis not present

## 2018-03-26 DIAGNOSIS — Z951 Presence of aortocoronary bypass graft: Secondary | ICD-10-CM | POA: Diagnosis not present

## 2018-03-26 DIAGNOSIS — I2584 Coronary atherosclerosis due to calcified coronary lesion: Secondary | ICD-10-CM | POA: Diagnosis present

## 2018-03-26 DIAGNOSIS — I5189 Other ill-defined heart diseases: Secondary | ICD-10-CM | POA: Diagnosis present

## 2018-03-26 DIAGNOSIS — Z87442 Personal history of urinary calculi: Secondary | ICD-10-CM

## 2018-03-26 DIAGNOSIS — Z79899 Other long term (current) drug therapy: Secondary | ICD-10-CM

## 2018-03-26 DIAGNOSIS — R32 Unspecified urinary incontinence: Secondary | ICD-10-CM | POA: Diagnosis not present

## 2018-03-26 DIAGNOSIS — I340 Nonrheumatic mitral (valve) insufficiency: Secondary | ICD-10-CM | POA: Diagnosis not present

## 2018-03-26 DIAGNOSIS — K5000 Crohn's disease of small intestine without complications: Secondary | ICD-10-CM | POA: Diagnosis not present

## 2018-03-26 DIAGNOSIS — E1122 Type 2 diabetes mellitus with diabetic chronic kidney disease: Secondary | ICD-10-CM | POA: Diagnosis present

## 2018-03-26 DIAGNOSIS — Z01810 Encounter for preprocedural cardiovascular examination: Secondary | ICD-10-CM | POA: Diagnosis not present

## 2018-03-26 DIAGNOSIS — E119 Type 2 diabetes mellitus without complications: Secondary | ICD-10-CM

## 2018-03-26 DIAGNOSIS — R0689 Other abnormalities of breathing: Secondary | ICD-10-CM | POA: Diagnosis not present

## 2018-03-26 DIAGNOSIS — I25110 Atherosclerotic heart disease of native coronary artery with unstable angina pectoris: Secondary | ICD-10-CM | POA: Diagnosis present

## 2018-03-26 DIAGNOSIS — E875 Hyperkalemia: Secondary | ICD-10-CM | POA: Diagnosis not present

## 2018-03-26 DIAGNOSIS — J939 Pneumothorax, unspecified: Secondary | ICD-10-CM

## 2018-03-26 DIAGNOSIS — Z7952 Long term (current) use of systemic steroids: Secondary | ICD-10-CM

## 2018-03-26 DIAGNOSIS — Z7982 Long term (current) use of aspirin: Secondary | ICD-10-CM

## 2018-03-26 DIAGNOSIS — I2583 Coronary atherosclerosis due to lipid rich plaque: Secondary | ICD-10-CM | POA: Diagnosis not present

## 2018-03-26 DIAGNOSIS — E876 Hypokalemia: Secondary | ICD-10-CM | POA: Diagnosis not present

## 2018-03-26 DIAGNOSIS — E785 Hyperlipidemia, unspecified: Secondary | ICD-10-CM | POA: Diagnosis present

## 2018-03-26 DIAGNOSIS — I519 Heart disease, unspecified: Secondary | ICD-10-CM | POA: Diagnosis present

## 2018-03-26 DIAGNOSIS — K219 Gastro-esophageal reflux disease without esophagitis: Secondary | ICD-10-CM | POA: Diagnosis present

## 2018-03-26 LAB — BASIC METABOLIC PANEL
ANION GAP: 9 (ref 5–15)
BUN: 27 mg/dL — ABNORMAL HIGH (ref 8–23)
CALCIUM: 8.9 mg/dL (ref 8.9–10.3)
CO2: 22 mmol/L (ref 22–32)
Chloride: 107 mmol/L (ref 98–111)
Creatinine, Ser: 1.51 mg/dL — ABNORMAL HIGH (ref 0.61–1.24)
GFR calc non Af Amer: 45 mL/min — ABNORMAL LOW (ref >60)
GFR, EST AFRICAN AMERICAN: 53 mL/min — AB (ref >60)
Glucose, Bld: 221 mg/dL — ABNORMAL HIGH (ref 70–99)
Potassium: 4 mmol/L (ref 3.5–5.1)
Sodium: 138 mmol/L (ref 135–145)

## 2018-03-26 LAB — I-STAT TROPONIN, ED
Troponin i, poc: 0.03 ng/mL (ref 0.00–0.08)
Troponin i, poc: 0.02 ng/mL (ref 0.00–0.08)

## 2018-03-26 LAB — CBC
HCT: 37.7 % — ABNORMAL LOW (ref 39.0–52.0)
Hemoglobin: 12.2 g/dL — ABNORMAL LOW (ref 13.0–17.0)
MCH: 31.7 pg (ref 26.0–34.0)
MCHC: 32.4 g/dL (ref 30.0–36.0)
MCV: 97.9 fL (ref 80.0–100.0)
Platelets: 170 10*3/uL (ref 150–400)
RBC: 3.85 MIL/uL — AB (ref 4.22–5.81)
RDW: 12.4 % (ref 11.5–15.5)
WBC: 9.2 10*3/uL (ref 4.0–10.5)
nRBC: 0 % (ref 0.0–0.2)

## 2018-03-26 LAB — GLUCOSE, CAPILLARY
Glucose-Capillary: 99 mg/dL (ref 70–99)
Glucose-Capillary: 194 mg/dL — ABNORMAL HIGH (ref 70–99)

## 2018-03-26 LAB — MAGNESIUM: Magnesium: 1.9 mg/dL (ref 1.7–2.4)

## 2018-03-26 LAB — ECHOCARDIOGRAM COMPLETE

## 2018-03-26 LAB — HEMOGLOBIN A1C
Hgb A1c MFr Bld: 6.9 % — ABNORMAL HIGH (ref 4.8–5.6)
Mean Plasma Glucose: 151.33 mg/dL

## 2018-03-26 LAB — TROPONIN I
TROPONIN I: 0.04 ng/mL — AB (ref <0.03)
TROPONIN I: 0.03 ng/mL — AB (ref <0.03)

## 2018-03-26 LAB — PHOSPHORUS: Phosphorus: 3.3 mg/dL (ref 2.5–4.6)

## 2018-03-26 MED ORDER — PANTOPRAZOLE SODIUM 40 MG PO TBEC
40.0000 mg | DELAYED_RELEASE_TABLET | Freq: Every day | ORAL | Status: DC
  Administered 2018-03-26 – 2018-03-31 (×5): 40 mg via ORAL
  Filled 2018-03-26 (×5): qty 1

## 2018-03-26 MED ORDER — ENOXAPARIN SODIUM 40 MG/0.4ML ~~LOC~~ SOLN: 40.0000 mg | SUBCUTANEOUS | Status: DC

## 2018-03-26 MED ORDER — SIMVASTATIN 20 MG PO TABS
40.0000 mg | ORAL_TABLET | Freq: Every day | ORAL | Status: DC
  Administered 2018-03-26: 40 mg via ORAL
  Filled 2018-03-26: qty 2

## 2018-03-26 MED ORDER — ASPIRIN 325 MG PO TABS: 325.0000 mg | ORAL_TABLET | Freq: Every day | ORAL | Status: DC

## 2018-03-26 MED ORDER — INSULIN ASPART 100 UNIT/ML ~~LOC~~ SOLN
0.0000 [IU] | Freq: Three times a day (TID) | SUBCUTANEOUS | Status: DC
  Administered 2018-03-27: 5 [IU] via SUBCUTANEOUS
  Administered 2018-03-28: 2 [IU] via SUBCUTANEOUS
  Administered 2018-03-31: 3 [IU] via SUBCUTANEOUS

## 2018-03-26 MED ORDER — METOPROLOL TARTRATE 12.5 MG HALF TABLET
12.5000 mg | ORAL_TABLET | Freq: Two times a day (BID) | ORAL | Status: DC
  Administered 2018-03-26 – 2018-03-31 (×9): 12.5 mg via ORAL
  Filled 2018-03-26 (×10): qty 1

## 2018-03-26 MED ORDER — ALBUTEROL SULFATE (2.5 MG/3ML) 0.083% IN NEBU: 3.0000 mL | INHALATION_SOLUTION | Freq: Four times a day (QID) | RESPIRATORY_TRACT | Status: DC | PRN

## 2018-03-26 MED ORDER — HYDROXYZINE HCL 25 MG PO TABS
25.0000 mg | ORAL_TABLET | Freq: Four times a day (QID) | ORAL | Status: DC | PRN
  Administered 2018-03-27 – 2018-03-31 (×9): 25 mg via ORAL
  Filled 2018-03-26 (×9): qty 1

## 2018-03-26 MED ORDER — PAROXETINE HCL 20 MG PO TABS
60.0000 mg | ORAL_TABLET | Freq: Every day | ORAL | Status: DC
  Filled 2018-03-26 (×2): qty 3

## 2018-03-26 MED ORDER — ONDANSETRON HCL 4 MG/2ML IJ SOLN: 4.0000 mg | Freq: Four times a day (QID) | INTRAMUSCULAR | Status: DC | PRN

## 2018-03-26 MED ORDER — ENOXAPARIN SODIUM 40 MG/0.4ML ~~LOC~~ SOLN
40.0000 mg | SUBCUTANEOUS | Status: DC
  Administered 2018-03-26: 40 mg via SUBCUTANEOUS
  Filled 2018-03-26: qty 0.4

## 2018-03-26 MED ORDER — PAROXETINE HCL 20 MG PO TABS
20.0000 mg | ORAL_TABLET | Freq: Every day | ORAL | Status: DC
  Administered 2018-03-28 – 2018-04-09 (×12): 20 mg via ORAL
  Filled 2018-03-26 (×12): qty 1

## 2018-03-26 MED ORDER — ASPIRIN EC 81 MG PO TBEC
81.0000 mg | DELAYED_RELEASE_TABLET | Freq: Every day | ORAL | Status: DC
  Administered 2018-03-26: 81 mg via ORAL
  Filled 2018-03-26: qty 1

## 2018-03-26 MED ORDER — ASPIRIN EC 325 MG PO TBEC: 325.0000 mg | DELAYED_RELEASE_TABLET | Freq: Every day | ORAL | Status: DC

## 2018-03-26 MED ORDER — DOCUSATE SODIUM 100 MG PO CAPS
100.0000 mg | ORAL_CAPSULE | Freq: Every day | ORAL | Status: DC
  Administered 2018-03-26 – 2018-03-31 (×5): 100 mg via ORAL
  Filled 2018-03-26 (×5): qty 1

## 2018-03-26 MED ORDER — OLANZAPINE 5 MG PO TABS: 5.0000 mg | ORAL_TABLET | Freq: Every day | ORAL | Status: DC

## 2018-03-26 MED ORDER — ONDANSETRON HCL 4 MG PO TABS: 4.0000 mg | ORAL_TABLET | Freq: Four times a day (QID) | ORAL | Status: DC | PRN

## 2018-03-26 MED ORDER — MESALAMINE ER 0.375 G PO CP24: 0.3750 g | ORAL_CAPSULE | Freq: Two times a day (BID) | ORAL | Status: DC

## 2018-03-26 MED ORDER — ACETAMINOPHEN 325 MG PO TABS: 650.0000 mg | ORAL_TABLET | Freq: Four times a day (QID) | ORAL | Status: DC | PRN

## 2018-03-26 MED ORDER — ALPRAZOLAM 0.25 MG PO TABS: 0.2500 mg | ORAL_TABLET | Freq: Two times a day (BID) | ORAL | Status: DC | PRN

## 2018-03-26 MED ORDER — LOSARTAN POTASSIUM 25 MG PO TABS
25.0000 mg | ORAL_TABLET | Freq: Every day | ORAL | Status: DC
  Filled 2018-03-26: qty 1

## 2018-03-26 MED ORDER — ACETAMINOPHEN 650 MG RE SUPP: 650.0000 mg | Freq: Four times a day (QID) | RECTAL | Status: DC | PRN

## 2018-03-26 NOTE — ED Triage Notes (Signed)
Pt C/O chest pain that started around 0000. Pt states when he walked from the couch to the bed he became SOB and started having CP. Pt states he has taken 1 nitro with relief. Pt has no chest pain at this time.

## 2018-03-26 NOTE — ED Notes (Signed)
CRITICAL VALUE ALERT  Critical Value:  Trop 0.04  Date & Time Notied:  03/26/18 1121  Provider Notified: Dr. Carles Collet  Orders Received/Actions taken: Yes

## 2018-03-26 NOTE — Progress Notes (Signed)
PROGRESS NOTE  [name] UJW:[id number] DOB: 1946-01-21 DOA: 03/26/2018 PCP: [name], MD  Brief History:  72 year old male with a history of prostate cancer, anxiety, Crohn's disease, diabetes mellitus, hypertension, hyperlipidemia presenting with substernal chest discomfort that began on the evening of 03/25/2018.  The patient felt that it occurred when he was walking from his couch back to bed.  However he had increasing chest discomfort with shortness of breath even while laying in bed.  He took one sublingual nitroglycerin which relieved his chest discomfort.  He stated that the pain radiated to his jaw as well as his left arm.  However, the patient gives a history of having intermittent chest discomfort over the past 4 weeks intermittently for which she has been drinking some vinegar giving some relief to his pain.  He denies any Fevers, chills, coughing, hemoptysis, nausea, vomiting, diarrhea, abdominal pain. Assessment/Plan: Chest pain -Atypical by clinical history -Cycle troponins -Echocardiogram -Consult cardiology -Continue aspirin  Diabetes mellitus type 2 -NovoLog sliding scale -Hemoglobin A1c -Patient states that he no longer takes glipizide  Essential hypertension -Continue losartan and HCTZ  CKD stage III -Baseline creatinine 1.2-1.5  Hyperlipidemia -Continue statin  Anxiety/depression -Continue paroxetine and olanzapine     Disposition Plan:   Home when cleared by cardiology Family Communication:   Wife updated at bedside  Consultants:  cardiology  Code Status:  FULL  DVT Prophylaxis:  [place] Lovenox  Total time spent 35 minutes.  Greater than 50% spent face to face counseling and coordinating care. 0730 to 0805   Procedures: As Listed in Progress Note Above  Antibiotics: None      Subjective: Patient denies fevers, chills, headache, chest pain, dyspnea, nausea, vomiting, diarrhea, abdominal pain, dysuria,  hematuria, hematochezia, and melena.   Objective: Vitals:   03/26/18 0300 03/26/18 0330 03/26/18 0600 03/26/18 0800  BP: 109/60 (!) 101/56 (!) 146/70 (!) 152/72  Pulse: (!) 58 (!) 56 70 66  Resp: 18 (!) 21 17 16   Temp:      TempSrc:      SpO2: 97% 99% 97% 98%   No intake or output data in the 24 hours ending 03/26/18 0806 Weight change:  Exam:   General:  Pt is alert, follows commands appropriately, not in acute distress  HEENT: No icterus, No thrush, No neck mass, [place]/AT  Cardiovascular: RRR, S1/S2, no rubs, no gallops  Respiratory: CTA bilaterally, no wheezing, no crackles, no rhonchi  Abdomen: Soft/+BS, non tender, non distended, no guarding  Extremities: No edema, No lymphangitis, No petechiae, No rashes, no synovitis   Data Reviewed: I have personally reviewed following labs and imaging studies Basic Metabolic Panel: Recent Labs  Lab 03/26/18 0216  NA 138  K 4.0  CL 107  CO2 22  GLUCOSE 221*  BUN 27*  CREATININE 1.51*  CALCIUM 8.9  MG 1.9  PHOS 3.3   Liver Function Tests: No results for input(s): AST, ALT, ALKPHOS, BILITOT, PROT, ALBUMIN in the last 168 hours. No results for input(s): LIPASE, AMYLASE in the last 168 hours. No results for input(s): AMMONIA in the last 168 hours. Coagulation Profile: No results for input(s): INR, PROTIME in the last 168 hours. CBC: Recent Labs  Lab 03/26/18 0216  WBC 9.2  HGB 12.2*  HCT 37.7*  MCV 97.9  PLT 170   Cardiac Enzymes: No results for input(s): CKTOTAL, CKMB, CKMBINDEX, TROPONINI in the last 168 hours. BNP: Invalid input(s): POCBNP CBG: No results  for input(s): GLUCAP in the last 168 hours. HbA1C: No results for input(s): HGBA1C in the last 72 hours. Urine analysis:    Component Value Date/Time   COLORURINE YELLOW 12/20/2008 1931   APPEARANCEUR CLEAR 12/20/2008 1931   LABSPEC 1.020 12/20/2008 1931   PHURINE 5.5 12/20/2008 1931   GLUCOSEU NEGATIVE 12/20/2008 1931   HGBUR NEGATIVE 12/20/2008  1931   BILIRUBINUR NEGATIVE 12/20/2008 1931   KETONESUR NEGATIVE 12/20/2008 1931   PROTEINUR NEGATIVE 12/20/2008 1931   UROBILINOGEN 0.2 12/20/2008 1931   NITRITE NEGATIVE 12/20/2008 1931   LEUKOCYTESUR  12/20/2008 1931    NEGATIVE MICROSCOPIC NOT DONE ON URINES WITH NEGATIVE PROTEIN, BLOOD, LEUKOCYTES, NITRITE, OR GLUCOSE <1000 mg/dL.   Sepsis Labs: @LABRCNTIP (procalcitonin:4,lacticidven:4) )No results found for this or any previous visit (from the past 240 hour(s)).   Scheduled Meds: . enoxaparin (LOVENOX) injection  40 mg Subcutaneous Q24H   Continuous Infusions:  Procedures/Studies: Dg Chest 2 View  Result Date: 03/26/2018 CLINICAL DATA:  Upper chest pain EXAM: CHEST - 2 VIEW COMPARISON:  07/05/2016 FINDINGS: Low lung volumes. Heart size accentuated by the low volumes and AP nature of the study, within normal limits. No confluent airspace opacities or effusions. No acute bony abnormality. IMPRESSION: Low lung volumes.  No active disease. Electronically Signed   By: Rolm Baptise M.D.   On: 03/26/2018 02:20    Orson Eva, DO  [facility] Pager [phone]  If 7PM-7AM, please contact night-coverage [email or web address] Password [email or web address] 03/26/2018, 8:06 AM   LOS: 0 days

## 2018-03-26 NOTE — H&P (Signed)
History and Physical    [name]:[id number] DOB: Sep 04, 1945 DOA: 03/26/2018  PCP: [name], MD   Patient coming from: Home.  I have personally briefly reviewed patient's old medical records in [place]  Chief Complaint: Chest pain.  HPI: [name] is a 72 y.o. 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  He stated that he took one sublingual nitroglycerin and his pain was relieved.  He denies diaphoresis, dizziness, palpitations, nausea or emesis.  He states that his wife called the ambulance.  Once he arrived to the hospital, he had a transient mild discomfort, but has been chest pain-free since then.  He mentions that last week he had a similar episode, but he felt more like indigestion and he was not as severe.  About 5 weeks ago he had another episode of chest pain, shortly after smoking a cigarette, but he was not as severe as this morning's.  He states that he has not smoked since then.  He denies fever, chills, sore throat, wheezing, hemoptysis, PND, orthopnea or pitting edema of the lower extremities.  He complains of occasional indigestion, but denies abdominal pain, nausea, emesis, melena or hematochezia.  Sometimes he gets loose stools due to his history of enteritis and radiation colitis.  He denies dysuria, frequency or hematuria.  He mentions that his blood glucose had been measuring in the low 100s at home.  He denies polyuria, polydipsia, polyphagia or blurred vision.  ED Course: Initial vital signs temperature 97.5 F, pulse 64,  respiration 14, blood pressure 141/69 mmHg and O2 sat 99% on room air.  Patient was given supplemental oxygen in the emergency department.  His white count was 9.2, hemoglobin 12.2 g/dL and platelets 170.  BMP shows a glucose of 221, BUN 127 and creatinine 1.51 mg/dL.  Electrolytes were normal.  Troponin x2, two hours apart were negative. EKG shows sinus rhythm with prolonged PR interval, normal R wave progression, early transition, old inferior infarct.  There are no significant changes when compared to previous tracings. Chest radiograph was hypoinflated, but no acute cardiopulmonary pathology was seen.  Review of Systems: As per HPI otherwise 10 point review of systems negative.   Past Medical History:  Diagnosis Date  . Adenocarcinoma of prostate ([place])   . Anxiety   . CAD (coronary artery disease)   . 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 Crohn's disease ([place])   . CTS (carpal tunnel syndrome)    Mild  . Depression   . Diabetes mellitus without complication (HCC)    Type 2  . Hyperlipidemia   . Hypertension   . Kidney stone   . Optic neuritis     Past Surgical History:  Procedure Laterality Date  . COLONOSCOPY    . KNEE CARTILAGE SURGERY Right   . LOWER EXTREMITY ANGIOGRAPHY N/A 08/13/2016   Procedure: Lower Extremity Angiography;  Surgeon: [name], MD;  Location: [facility];  Service: Cardiovascular;  Laterality: N/A;  . NASAL SINUS SURGERY    . PROSTATE SURGERY       reports that he has been smoking. He has  never used smokeless tobacco. He reports that he does not drink alcohol or use drugs.  Allergies  Allergen Reactions  . Flomax [Tamsulosin Hcl]     High dose   . Morphine And Related     Family History  Problem Relation Age of Onset  . Hypertension Mother   . Colon cancer Mother   . Heart attack Father   . Coronary artery disease Father   . Hypertension Sister   . Crohn's disease Brother    Prior to Admission medications   Medication Sig Start Date  End Date Taking? Authorizing Provider  albuterol (PROVENTIL HFA;VENTOLIN HFA) 108 (90 Base) MCG/ACT inhaler Inhale 2 puffs into the lungs every 6 (six) hours as needed for wheezing. 06/04/17   [name], MD  ascorbic acid (VITAMIN C) 500 MG tablet Take 500 mg by mouth 2 (two) times daily.    [provider]  aspirin 325 MG EC tablet Take 325 mg by mouth daily.    [provider]  cefPROZIL (CEFZIL) 500 MG tablet Take 1 tablet (500 mg total) by mouth 2 (two) times daily. Patient not taking: Reported on 08/28/2017 06/04/17   [name], MD  clotrimazole (LOTRIMIN) 1 % cream Apply 1 application topically 2 (two) times daily as needed.     [provider]  docusate sodium (COLACE) 100 MG capsule Take 100 mg by mouth 2 (two) times daily.    [provider]  flunisolide (NASAREL) 29 MCG/ACT (0.025%) nasal spray Place 1 spray into the nose daily as needed for allergies.     [provider]  glipiZIDE (GLUCOTROL) 5 MG tablet Take 1 tablet (5 mg total) by mouth daily before breakfast. 08/28/17   [name], MD  hydrochlorothiazide (HYDRODIURIL) 25 MG tablet Take 12.5 mg by mouth daily.     [provider]  losartan (COZAAR) 25 MG tablet Take 1 tablet (25 mg total) by mouth daily. 06/13/16 08/28/17  [place], Peter M, MD  mesalamine (APRISO) 0.375 g 24 hr capsule Take 0.75 mg by mouth 2 (two) times daily.    [provider]  metFORMIN (GLUCOPHAGE) 500 MG tablet Take 1 tablet (500 mg total) by mouth 2 (two) times daily with a meal. 08/28/17   [name], MD  metoprolol tartrate (LOPRESSOR) 25 MG tablet Take 12.5 mg by mouth 2 (two) times daily.     [provider]  nitroGLYCERIN (NITROSTAT) 0.4 MG SL tablet Place 1 tablet (0.4 mg total) under the tongue every 5 (five) minutes as needed. 03/18/13   Croitoru, Mihai, MD  OLANZapine (ZYPREXA) 5 MG tablet Take by mouth. Takes one half every day    [provider]    omeprazole (PRILOSEC) 20 MG capsule Take 20 mg by mouth daily.    [provider]  PARoxetine (PAXIL) 40 MG tablet Take 60 mg by mouth at bedtime. Take 1 1/2 tablets at bedtime     [provider]  predniSONE (DELTASONE) 10 MG tablet Take 4 qd for 3 days 3qd for 3 days Patient not taking: Reported on 08/28/2017 06/04/17   [name], MD  simvastatin (ZOCOR) 80 MG tablet Take 1 tablet (80 mg total) by mouth at bedtime. 09/09/17   [name], MD  traZODone (DESYREL) 50 MG tablet Take 50 mg by mouth at bedtime.     [provider]  triamcinolone cream (KENALOG) 0.1 % Apply 1 application topically 2 (two) times daily. Patient taking differently: Apply 1  application topically 2 (two) times daily as needed.  07/13/16   [name], MD    Physical Exam: Vitals:   03/26/18 0104 03/26/18 0300 03/26/18 0330  BP: (!) 141/69 109/60 (!) 101/56  Pulse: 64 (!) 58 (!) 56  Resp: 14 18 (!) 21  Temp: (!) 97.5 F (36.4 C)    TempSrc: Oral    SpO2: 99% 97% 99%    Constitutional: NAD, calm, comfortable Eyes: PERRL, lids and conjunctivae normal ENMT: Mucous membranes are moist. Posterior pharynx clear of any exudate or lesions. Neck: normal, supple, no masses, no thyromegaly Respiratory: Clear to auscultation bilaterally, no wheezing, no crackles. Normal respiratory effort. No accessory muscle use.  Cardiovascular: Bradycardic 59 bpm, no murmurs / rubs / gallops. No extremity edema.  Absent popliteal and pedal pulses. No carotid bruits.  Abdomen: Soft, no tenderness, no masses palpated. No hepatosplenomegaly. Bowel sounds positive.  Musculoskeletal: no clubbing / cyanosis. Good ROM, no contractures. Normal muscle tone.  Skin:  no rashes, lesions, ulcers on limited dermatological examination. Neurologic: CN 2-12 grossly intact. Sensation intact, DTR normal. Strength 5/5 in all 4.  Psychiatric: Normal judgment and insight. Alert and oriented x 3. Normal mood.    Labs on Admission: I have personally reviewed following labs and imaging studies  CBC: Recent Labs  Lab 03/26/18 0216  WBC 9.2  HGB 12.2*  HCT 37.7*  MCV 97.9  PLT 478   Basic Metabolic Panel: Recent Labs  Lab 03/26/18 0216  NA 138  K 4.0  CL 107  CO2 22  GLUCOSE 221*  BUN 27*  CREATININE 1.51*  CALCIUM 8.9   GFR: CrCl cannot be calculated (Unknown ideal weight.). Liver Function Tests: No results for input(s): AST, ALT, ALKPHOS, BILITOT, PROT, ALBUMIN in the last 168 hours. No results for input(s): LIPASE, AMYLASE in the last 168 hours. No results for input(s): AMMONIA in the last 168 hours. Coagulation Profile: No results for input(s): INR, PROTIME in the last 168 hours. Cardiac Enzymes: No results for input(s): CKTOTAL, CKMB, CKMBINDEX, TROPONINI in the last 168 hours. BNP (last 3 results) No results for input(s): PROBNP in the last 8760 hours. HbA1C: No results for input(s): HGBA1C in the last 72 hours. CBG: No results for input(s): GLUCAP in the last 168 hours. Lipid Profile: No results for input(s): CHOL, HDL, LDLCALC, TRIG, CHOLHDL, LDLDIRECT in the last 72 hours. Thyroid Function Tests: No results for input(s): TSH, T4TOTAL, FREET4, T3FREE, THYROIDAB in the last 72 hours. Anemia Panel: No results for input(s): VITAMINB12, FOLATE, FERRITIN, TIBC, IRON, RETICCTPCT in the last 72 hours. Urine analysis:    Component Value Date/Time   COLORURINE YELLOW 12/20/2008 1931   APPEARANCEUR CLEAR 12/20/2008 1931   LABSPEC 1.020 12/20/2008 1931   PHURINE 5.5 12/20/2008 1931   GLUCOSEU NEGATIVE 12/20/2008 1931   HGBUR NEGATIVE 12/20/2008 1931   BILIRUBINUR NEGATIVE 12/20/2008 1931   KETONESUR NEGATIVE 12/20/2008 1931   PROTEINUR NEGATIVE 12/20/2008 1931   UROBILINOGEN 0.2 12/20/2008 1931   NITRITE NEGATIVE 12/20/2008 1931   LEUKOCYTESUR  12/20/2008 1931    NEGATIVE MICROSCOPIC NOT DONE ON URINES WITH NEGATIVE PROTEIN, BLOOD, LEUKOCYTES, NITRITE, OR GLUCOSE  <1000 mg/dL.    Radiological Exams on Admission: Dg Chest 2 View  Result Date: 03/26/2018 CLINICAL DATA:  Upper chest pain EXAM: CHEST - 2 VIEW COMPARISON:  07/05/2016 FINDINGS: Low lung volumes. Heart size accentuated by the low volumes and AP nature of the study, within normal limits. No confluent airspace opacities or effusions. No acute bony  abnormality. IMPRESSION: Low lung volumes.  No active disease. Electronically Signed   By: Rolm Baptise M.D.   On: 03/26/2018 02:20    EKG: Independently reviewed.  Vent. rate 67 BPM PR interval * ms QRS duration 99 ms QT/QTc 426/450 ms P-R-T axes 41 -24 22 Sinus rhythm Prolonged PR interval Abnormal R-wave progression, early transition Inferior infarct, old  Assessment/Plan Principal Problem:   Chest pain Observation/telemetry. Continue supplemental oxygen. Continue aspirin and metoprolol. Trend troponin levels. Check echocardiogram. Consider cardiology evaluation if CP recurs or troponin becomes positive.  Active Problems:   CAD s/p PCI LAD  Continue aspirin, metoprolol and simvastatin.  Grade II diastolic dysfunction No signs of decompensation. Continue diuretic, ARB and beta-blocker.    Essential hypertension Continue hydrochlorothiazide 12.5 mg p.o. daily. Continue losartan 25 mg p.o. daily. Continue metoprolol 12.5 mg p.o. twice daily.    PAD (peripheral artery disease) (HCC) Continue aspirin and simvastatin.     Type 2 diabetes mellitus (HCC) Last hemoglobin A1c 7.2% in May this year. Carbohydrate modified diet. Continue glipizide 5 mg p.o. daily before breakfast. Continue metformin 500 mg p.o. twice daily. CBG monitoring with regular insulin sliding scale.    Hypercholesterolemia Continue simvastatin 80 mg p.o. at bedtime. Monitor LFTs as needed. Fasting lipid follow-up as an outpatient.    ANXIETY DEPRESSION Continue paroxetine 60 mg p.o. daily. Continue trazodone 50 mg p.o. at bedtime. Continue Zyprexa  2.5 mg p.o. daily.    Crohn's disease ([place]) Continue Apriso p.o. twice daily.    Tobacco use The patient quit smoking about 4 to 5 weeks ago. Encouraged to continue cessation.   All medications will need to be reconciled later today once his medication list is brought to the hospital.   DVT prophylaxis: Lovenox SQ. Code Status: Full code. Family Communication:  Disposition Plan: Observation for telemetry monitoring and troponin level trending. Consults called: Admission status: Observation/telemetry.   Reubin Milan MD [facility] Pager [phone].  If 7PM-7AM, please contact night-coverage [email or web address] Password [facility]  03/26/2018, 5:13 AM   This document was prepared using Dragon voice recognition software and may contain some unintended transcription errors.

## 2018-03-26 NOTE — ED Notes (Signed)
Cardiology in room 

## 2018-03-26 NOTE — ED Notes (Signed)
CRITICAL VALUE ALERT  Critical Value:  Troponin 0.04  Date & Time Notied:  10:50 03/26/2018  Provider Notified: Dr. Carles Collet messaged  Orders Received/Actions taken: see chart

## 2018-03-26 NOTE — ED Provider Notes (Signed)
[facility] EMERGENCY DEPARTMENT Provider Note   CSN: [id number] Arrival date & time: 03/26/18  0055     History   Chief Complaint Chief Complaint  Patient presents with  . Chest Pain    HPI [name] is a 72 y.o. male.  The history is provided by the patient and the spouse.  Chest Pain   This is a new problem. The current episode started 1 to 2 hours ago. The problem occurs constantly. The problem has been resolved. The pain is present in the substernal region. The pain is severe. The quality of the pain is described as pressure-like. The pain radiates to the left arm. Associated symptoms include shortness of breath. Pertinent negatives include no diaphoresis, no syncope and no vomiting. He has tried nitroglycerin (ASA) for the symptoms. The treatment provided significant relief. Risk factors include being elderly.  His past medical history is significant for CAD.   Patient with known distant history of CAD, diabetes, hypertension, hyperlipidemia presents with chest pain.  He reports he started having chest pressure that radiated to his left arm with associated shortness of breath.  He reports over the past few weeks has been having symptoms similar to this, but this was the worst episode. He reports he thought the last episodes were anxiety, but this felt worse. He reports he is currently chest pain-free Past Medical History:  Diagnosis Date  . Adenocarcinoma of prostate ([place])   . Anxiety   . CAD (coronary artery disease)   . 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 Crohn's disease ([place])   . CTS (carpal tunnel syndrome)    Mild  . Depression   . Diabetes mellitus without complication (HCC)    Type 2  . Hyperlipidemia   . Hypertension   . Kidney stone   . Optic neuritis     Patient Active Problem List   Diagnosis Date Noted  . Dyspnea 06/13/2016  . PAD (peripheral artery disease) ([place]) 06/13/2016  . Prostate cancer ([place]) 06/06/2016  . CAD s/p PCI LAD  03/21/2013  .  IRRITABLE BOWEL SYNDROME 08/09/2008  . FECAL INCONTINENCE 08/09/2008  . DIABETES MELLITUS 06/02/2007  . Hypercholesterolemia 06/02/2007  . ANXIETY DEPRESSION 06/02/2007  . Essential hypertension 06/02/2007  . ESOPHAGEAL REFLUX 06/02/2007  . 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 ULCERATIVE PROCTITIS 06/02/2007  . DIVERTICULOSIS, COLON 06/02/2007    Past Surgical History:  Procedure Laterality Date  . COLONOSCOPY    . KNEE CARTILAGE SURGERY Right   . LOWER EXTREMITY ANGIOGRAPHY N/A 08/13/2016   Procedure: Lower Extremity Angiography;  Surgeon: [name], MD;  Location: [facility];  Service: Cardiovascular;  Laterality: N/A;  . NASAL SINUS SURGERY    . PROSTATE SURGERY          Home Medications    Prior to Admission medications   Medication Sig Start Date End Date Taking? Authorizing Provider  albuterol (PROVENTIL HFA;VENTOLIN HFA) 108 (90 Base) MCG/ACT inhaler Inhale 2 puffs into the lungs every 6 (six) hours as needed for wheezing. 06/04/17   [name], MD  ascorbic acid (VITAMIN C) 500 MG tablet Take 500 mg by mouth 2 (two) times daily.    [provider]  aspirin 325 MG EC tablet Take 325 mg by mouth daily.    [provider]  cefPROZIL (CEFZIL) 500 MG tablet Take 1 tablet (500 mg total) by mouth 2 (two) times daily. Patient not taking: Reported on 08/28/2017 06/04/17   [name],  MD  clotrimazole (LOTRIMIN) 1 % cream Apply 1 application topically 2 (two) times daily as needed.     [provider]  docusate sodium (COLACE) 100 MG capsule Take 100 mg by mouth 2 (two) times daily.    [provider]  flunisolide (NASAREL) 29 MCG/ACT (0.025%) nasal spray Place 1 spray into the nose daily as needed for allergies.     [provider]  glipiZIDE (GLUCOTROL) 5 MG tablet Take 1 tablet (5 mg total) by mouth daily before breakfast. 08/28/17   [name], MD  hydrochlorothiazide (HYDRODIURIL) 25 MG tablet Take  12.5 mg by mouth daily.     [provider]  losartan (COZAAR) 25 MG tablet Take 1 tablet (25 mg total) by mouth daily. 06/13/16 08/28/17  Martinique, Peter M, MD  mesalamine (APRISO) 0.375 g 24 hr capsule Take 0.75 mg by mouth 2 (two) times daily.    [provider]  metFORMIN (GLUCOPHAGE) 500 MG tablet Take 1 tablet (500 mg total) by mouth 2 (two) times daily with a meal. 08/28/17   [name], MD  metoprolol tartrate (LOPRESSOR) 25 MG tablet Take 12.5 mg by mouth 2 (two) times daily.     [provider]  nitroGLYCERIN (NITROSTAT) 0.4 MG SL tablet Place 1 tablet (0.4 mg total) under the tongue every 5 (five) minutes as needed. 03/18/13   Croitoru, Mihai, MD  OLANZapine (ZYPREXA) 5 MG tablet Take by mouth. Takes one half every day    [provider]  omeprazole (PRILOSEC) 20 MG capsule Take 20 mg by mouth daily.    [provider]  PARoxetine (PAXIL) 40 MG tablet Take 60 mg by mouth at bedtime. Take 1 1/2 tablets at bedtime     [provider]  predniSONE (DELTASONE) 10 MG tablet Take 4 qd for 3 days 3qd for 3 days Patient not taking: Reported on 08/28/2017 06/04/17   [name], MD  simvastatin (ZOCOR) 80 MG tablet Take 1 tablet (80 mg total) by mouth at bedtime. 09/09/17   [name], MD  traZODone (DESYREL) 50 MG tablet Take 50 mg by mouth at bedtime.     [provider]  triamcinolone cream (KENALOG) 0.1 % Apply 1 application topically 2 (two) times daily. Patient taking differently: Apply 1 application topically 2 (two) times daily as needed.  07/13/16   [name], MD    Family History Family History  Problem Relation Age of Onset  . Hypertension Mother   . Colon cancer Mother   . Heart attack Father   . Coronary artery disease Father   . Hypertension Sister   . Crohn's disease Brother     Social History Social History   Tobacco Use  . Smoking status: Current Every Day Smoker  . Smokeless tobacco:  Never Used  Substance Use Topics  . Alcohol use: No  . Drug use: No     Allergies   Flomax [tamsulosin hcl] and Morphine and related   Review of Systems Review of Systems  Constitutional: Negative for diaphoresis.  Respiratory: Positive for shortness of breath.   Cardiovascular: Positive for chest pain. Negative for syncope.  Gastrointestinal: Negative for vomiting.  Neurological: Negative for syncope.  Psychiatric/Behavioral: The patient is nervous/anxious.   All other systems reviewed and are negative.    Physical Exam Updated Vital Signs BP (!) 141/69 (BP Location: Right Arm)   Pulse 64   Temp (!) 97.5 F (36.4 C) (Oral)   Resp 14  SpO2 99%   Physical Exam CONSTITUTIONAL: Well developed/well nourished HEAD: Normocephalic/atraumatic EYES: EOMI/PERRL ENMT: Mucous membranes moist NECK: supple no meningeal signs SPINE/BACK:entire spine nontender CV: S1/S2 noted, no murmurs/rubs/gallops noted LUNGS: Lungs are clear to auscultation bilaterally, no apparent distress ABDOMEN: soft, nontender, no rebound or guarding, bowel sounds noted throughout abdomen GU:no cva tenderness NEURO: Pt is awake/alert/appropriate, moves all extremitiesx4.  No facial droop.   EXTREMITIES: pulses normal/equal, full ROM, no LE edema/tenderness SKIN: warm, color normal PSYCH: Mildly anxious   ED Treatments / Results  Labs (all labs ordered are listed, but only abnormal results are displayed) Labs Reviewed  BASIC METABOLIC PANEL - Abnormal; Notable for the following components:      Result Value   Glucose, Bld 221 (*)    BUN 27 (*)    Creatinine, Ser 1.51 (*)    GFR calc non Af Amer 45 (*)    GFR calc Af Amer 53 (*)    All other components within normal limits  CBC - Abnormal; Notable for the following components:   RBC 3.85 (*)    Hemoglobin 12.2 (*)    HCT 37.7 (*)    All other components within normal limits  MAGNESIUM  PHOSPHORUS  TROPONIN I  TROPONIN I  I-STAT TROPONIN,  ED  I-STAT TROPONIN, ED    EKG EKG Interpretation  Date/Time:  Wednesday March 26 2018 01:25:04 EST Ventricular Rate:  67 PR Interval:    QRS Duration: 99 QT Interval:  426 QTC Calculation: 450 R Axis:   -24 Text Interpretation:  Sinus rhythm Prolonged PR interval Abnormal R-wave progression, early transition Inferior infarct, old Interpretation limited secondary to artifact Confirmed by Ripley Fraise [phone]) on 03/26/2018 1:35:13 AM   Radiology Dg Chest 2 View  Result Date: 03/26/2018 CLINICAL DATA:  Upper chest pain EXAM: CHEST - 2 VIEW COMPARISON:  07/05/2016 FINDINGS: Low lung volumes. Heart size accentuated by the low volumes and AP nature of the study, within normal limits. No confluent airspace opacities or effusions. No acute bony abnormality. IMPRESSION: Low lung volumes.  No active disease. Electronically Signed   By: Rolm Baptise M.D.   On: 03/26/2018 02:20    Procedures Procedures (including critical care time)  Medications Ordered in ED Medications - No data to display   Initial Impression / Assessment and Plan / ED Course  I have reviewed the triage vital signs and the nursing notes.  Pertinent labs & imaging results that were available during my care of the patient were reviewed by me and considered in my medical decision making (see chart for details).     3:03 AM Patient with known history of CAD presents with concerning story of chest pain.  He will need to be admitted.  However I will recheck a troponin after 2 hours and if it is significantly escalated he will need to be transferred to the cardiac center.  Patient otherwise stable this time 4:56 AM Pt stable No new CP  Repeat troponin is downtrending Will admit to  D/w dr Olevia Bowens for admission  Final Clinical Impressions(s) / ED Diagnoses   Final diagnoses:  Chest pain, rule out acute myocardial infarction    ED Discharge Orders    None       Ripley Fraise, MD 03/26/18  [phone]

## 2018-03-26 NOTE — Consult Note (Addendum)
Cardiology Consult    Patient ID: HILTON SAEPHAN; [id number]; 12-Mar-1946   Admit date: 03/26/2018 Date of Consult: 03/26/2018  Primary Care Provider: [name], MD Primary Cardiologist: Peter Martinique, MD  PAD: Dr. Gwenlyn Found   Patient Profile    [name] is a 72 y.o. male with past medical history of CAD (s/p stenting of LAD in 1996 and angioplasty alone to LAD in 1999), PAD (known bilateral SFA occlusions by angiography in 07/2016), HTN, HLD, Type 2 DM, intermittent tobacco use and depression who is being seen today for the evaluation of chest pain at the request of Dr. Carles Collet.   History of Present Illness    [name] was last examined by Dr. Martinique in 08/2016 reported having quit smoking since his last office visit. He denied any recent chest pain or dyspnea on exertion at that time.  He had recently undergone lower extremity angiography by Dr. Gwenlyn Found and medical therapy was recommended. Was continued on his current medication regimen including ASA 325 daily, HCTZ 12.5 mg daily, Losartan 25 mg daily, Lopressor 12.5 mg twice daily, and Simvastatin 40 mg daily.  He presented to [facility] ED during the early morning hours of 03/26/2018 for evaluation of chest pain and shortness of breath. In talking with the patient today, he reports having 3-4 episodes of chest discomfort over the past month which is new for him. His symptoms typically occur upon lying down at night to go to sleep and have resolved with consuming vinegar. This morning, he developed chest discomfort when walking to the restroom and noted associated discomfort along his left arm and up to his jaw that felt different from his previous episodes. Experienced improvement in his symptoms with SL NTG. Says the pain did not wake him from sleep. He is not overly active at baseline and reports having dyspnea with minimal activities which is not new. No recent orthopnea, PND, lower extremity edema, or  palpitations.  Initial labs show WBC 9.2, Hgb 12.2, platelets 170, Na+ 138, K+ 4.0, and creatine 1.51 (baseline 1.3 - 1.4). Initial and delta troponin negative thus far. EKG shows NSR, HR 67, with 1st degree AV Block and nonspecific ST abnormality along Lead III.    Past Medical History:  Diagnosis Date  . Adenocarcinoma of prostate ([place])   . Anxiety   . CAD (coronary artery disease)    a. s/p prior stenting of LAD in 1996 and angioplasty alone in 1999 to LAD by review of prior notes.   . 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 Crohn's disease ([place])   . CTS (carpal tunnel syndrome)    Mild  . Depression   . Diabetes mellitus without complication (HCC)    Type 2  . Hyperlipidemia   . Hypertension   . Kidney stone   . Optic neuritis     Past Surgical History:  Procedure Laterality Date  . COLONOSCOPY    . KNEE CARTILAGE SURGERY Right   . LOWER EXTREMITY ANGIOGRAPHY N/A 08/13/2016   Procedure: Lower Extremity Angiography;  Surgeon: [name], MD;  Location: [place] [facility];  Service: Cardiovascular;  Laterality: N/A;  . NASAL SINUS SURGERY    . PROSTATE SURGERY       Home Medications:  Prior to Admission medications   Medication Sig Start Date End Date Taking? Authorizing Provider  albuterol (PROVENTIL HFA;VENTOLIN HFA) 108 (90 Base) MCG/ACT inhaler Inhale 2 puffs into the lungs every 6 (six) hours as needed for  wheezing. 06/04/17   [name], MD  ascorbic acid (VITAMIN C) 500 MG tablet Take 500 mg by mouth 2 (two) times daily.    [provider]  aspirin 325 MG EC tablet Take 325 mg by mouth daily.    [provider]  cefPROZIL (CEFZIL) 500 MG tablet Take 1 tablet (500 mg total) by mouth 2 (two) times daily. Patient not taking: Reported on 08/28/2017 06/04/17   [name], MD  clotrimazole (LOTRIMIN) 1 % cream Apply 1 application topically 2 (two) times daily as needed.     [provider]  docusate sodium (COLACE) 100 MG capsule Take 100  mg by mouth 2 (two) times daily.    [provider]  flunisolide (NASAREL) 29 MCG/ACT (0.025%) nasal spray Place 1 spray into the nose daily as needed for allergies.     [provider]  glipiZIDE (GLUCOTROL) 5 MG tablet Take 1 tablet (5 mg total) by mouth daily before breakfast. 08/28/17   [name], MD  hydrochlorothiazide (HYDRODIURIL) 25 MG tablet Take 12.5 mg by mouth daily.     [provider]  losartan (COZAAR) 25 MG tablet Take 1 tablet (25 mg total) by mouth daily. 06/13/16 08/28/17  Martinique, Peter M, MD  mesalamine (APRISO) 0.375 g 24 hr capsule Take 0.75 mg by mouth 2 (two) times daily.    [provider]  metFORMIN (GLUCOPHAGE) 500 MG tablet Take 1 tablet (500 mg total) by mouth 2 (two) times daily with a meal. 08/28/17   [name], MD  metoprolol tartrate (LOPRESSOR) 25 MG tablet Take 12.5 mg by mouth 2 (two) times daily.     [provider]  nitroGLYCERIN (NITROSTAT) 0.4 MG SL tablet Place 1 tablet (0.4 mg total) under the tongue every 5 (five) minutes as needed. 03/18/13   Croitoru, Mihai, MD  OLANZapine (ZYPREXA) 5 MG tablet Take by mouth. Takes one half every day    [provider]  omeprazole (PRILOSEC) 20 MG capsule Take 20 mg by mouth daily.    [provider]  PARoxetine (PAXIL) 40 MG tablet Take 60 mg by mouth at bedtime. Take 1 1/2 tablets at bedtime     [provider]  predniSONE (DELTASONE) 10 MG tablet Take 4 qd for 3 days 3qd for 3 days Patient not taking: Reported on 08/28/2017 06/04/17   [name], MD  simvastatin (ZOCOR) 80 MG tablet Take 1 tablet (80 mg total) by mouth at bedtime. 09/09/17   [name], MD  traZODone (DESYREL) 50 MG tablet Take 50 mg by mouth at bedtime.     [provider]  triamcinolone cream (KENALOG) 0.1 % Apply 1 application topically 2 (two) times daily. Patient taking differently: Apply 1 application topically 2 (two) times daily as needed.   07/13/16   [name], MD    Inpatient Medications: Scheduled Meds: . aspirin  325 mg Oral Daily  . docusate sodium  100 mg Oral Daily  . enoxaparin (LOVENOX) injection  40 mg Subcutaneous Q24H  . losartan  25 mg Oral Daily  . mesalamine  0.375 g Oral BID  . metoprolol tartrate  12.5 mg Oral BID  . pantoprazole  40 mg Oral Daily  . PARoxetine  60 mg Oral QHS   Continuous Infusions:  PRN Meds: acetaminophen **OR** acetaminophen, ALPRAZolam, ondansetron **OR** ondansetron (ZOFRAN) IV  Allergies:    Allergies  Allergen Reactions  . Flomax [Tamsulosin Hcl]     High  dose   . Morphine And Related     Social History:   Social History   Socioeconomic History  . Marital status: Married    Spouse name: Not on file  . Number of children: Not on file  . Years of education: 1  . Highest education level: Not on file  Occupational History  . Occupation: Dealer  Social Needs  . Financial resource strain: Not on file  . Food insecurity:    Worry: Not on file    Inability: Not on file  . Transportation needs:    Medical: Not on file    Non-medical: Not on file  Tobacco Use  . Smoking status: Current Every Day Smoker  . Smokeless tobacco: Never Used  Substance and Sexual Activity  . Alcohol use: No  . Drug use: No  . Sexual activity: Not on file  Lifestyle  . Physical activity:    Days per week: Not on file    Minutes per session: Not on file  . Stress: Not on file  Relationships  . Social connections:    Talks on phone: Not on file    Gets together: Not on file    Attends religious service: Not on file    Active member of club or organization: Not on file    Attends meetings of clubs or organizations: Not on file    Relationship status: Not on file  . Intimate partner violence:    Fear of current or ex partner: Not on file    Emotionally abused: Not on file    Physically abused: Not on file    Forced sexual activity: Not on file  Other Topics Concern  .  Not on file  Social History Narrative  . Not on file     Family History:    Family History  Problem Relation Age of Onset  . Hypertension Mother   . Colon cancer Mother   . Heart attack Father   . Coronary artery disease Father   . Hypertension Sister   . Crohn's disease Brother       Review of Systems    General:  No chills, fever, night sweats or weight changes.  Cardiovascular:  No edema, orthopnea, palpitations, paroxysmal nocturnal dyspnea. Positive for chest pain and dyspnea on exertion.  Dermatological: No rash, lesions/masses Respiratory: No cough, dyspnea Urologic: No hematuria, dysuria Abdominal:   No nausea, vomiting, diarrhea, bright red blood per rectum, melena, or hematemesis Neurologic:  No visual changes, wkns, changes in mental status.  All other systems reviewed and are otherwise negative except as noted above.  Physical Exam/Data    Vitals:   03/26/18 0600 03/26/18 0800 03/26/18 0830 03/26/18 0900  BP: (!) 146/70 (!) 152/72 127/68 123/71  Pulse: 70 66 63 (!) 58  Resp: 17 16 18 18   Temp:      TempSrc:      SpO2: 97% 98% 96% 96%   No intake or output data in the 24 hours ending 03/26/18 0937 There were no vitals filed for this visit. There is no height or weight on file to calculate BMI.   General: Pleasant, Caucasian male appearing in NAD Psych: Normal affect. Neuro: Alert and oriented X 3. Moves all extremities spontaneously. HEENT: Normal  Neck: Supple without bruits or JVD. Lungs:  Resp regular and unlabored, CTA without wheezing or rales. Heart: RRR no s3, s4, or murmurs. Abdomen: Soft, non-tender, non-distended, BS + x 4.  Extremities: No clubbing, cyanosis or lowe extremity  edema. DP/PT/Radials 2+ and equal bilaterally.   EKG:  The EKG was personally reviewed and demonstrates: NSR, HR 67, with 1st degree AV Block and nonspecific ST abnormality along Lead III.   Telemetry:  Telemetry was personally reviewed and demonstrates: NSR, HR in  60's to 70's with occasional PVC's.    Labs/Studies     Relevant CV Studies:  Echocardiogram: 06/2016 Study Conclusions  - Left ventricle: The cavity size was normal. Wall thickness was   normal. Systolic function was normal. The estimated ejection   fraction was in the range of 55% to 60%. Wall motion was normal;   there were no regional wall motion abnormalities. Features are   consistent with a pseudonormal left ventricular filling pattern,   with concomitant abnormal relaxation and increased filling   pressure (grade 2 diastolic dysfunction). - Aortic valve: There was no stenosis. There was trivial   regurgitation. - Mitral valve: Mildly calcified annulus. There was no significant   regurgitation. - Right ventricle: The cavity size was normal. Systolic function   was normal. - Tricuspid valve: Peak RV-RA gradient (S): 26 mm Hg. - Pulmonary arteries: PA peak pressure: 29 mm Hg (S). - Inferior vena cava: The vessel was normal in size. The   respirophasic diameter changes were in the normal range (>= 50%),   consistent with normal central venous pressure.  Impressions:  - Normal LV size with EF 55-60%. Moderate diastolic dysfunction.   Normal RV size and systolic function. No significant valvular   abnormalities.  Cardiac Catheterization: 06/1997   Laboratory Data:  Chemistry Recent Labs  Lab 03/26/18 0216  NA 138  K 4.0  CL 107  CO2 22  GLUCOSE 221*  BUN 27*  CREATININE 1.51*  CALCIUM 8.9  GFRNONAA 45*  GFRAA 53*  ANIONGAP 9    No results for input(s): PROT, ALBUMIN, AST, ALT, ALKPHOS, BILITOT in the last 168 hours. Hematology Recent Labs  Lab 03/26/18 0216  WBC 9.2  RBC 3.85*  HGB 12.2*  HCT 37.7*  MCV 97.9  MCH 31.7  MCHC 32.4  RDW 12.4  PLT 170   Cardiac EnzymesNo results for input(s): TROPONINI in the last 168 hours.  Recent Labs  Lab 03/26/18 0225 03/26/18 0425  TROPIPOC 0.03 0.02    BNPNo results for input(s): BNP, PROBNP in  the last 168 hours.  DDimer No results for input(s): DDIMER in the last 168 hours.  Radiology/Studies:  Dg Chest 2 View  Result Date: 03/26/2018 CLINICAL DATA:  Upper chest pain EXAM: CHEST - 2 VIEW COMPARISON:  07/05/2016 FINDINGS: Low lung volumes. Heart size accentuated by the low volumes and AP nature of the study, within normal limits. No confluent airspace opacities or effusions. No acute bony abnormality. IMPRESSION: Low lung volumes.  No active disease. Electronically Signed   By: Rolm Baptise M.D.   On: 03/26/2018 02:20    Assessment & Plan    1. Chest Pain in Setting of Known CAD - the patient has a history of stenting to the LAD in 1996 and angioplasty in 1999. By review of scanned cath reports, he had 30% Ost LCx, 60-70% Prox-LCx, and 30% RCA stenoses at the time of his last catheterization in 1999 as well.  - overall, his chest pain symptoms over the past few weeks seem atypical as they occur when lying down and were relieved with consuming vinegar. His episode this morning was more concerning for UA as it occurred with ambulation and he experienced pain down his left  arm and up to his jaw which resembled his symptoms when he required stent placement in the past.  - initial and delta troponin values have been negative thus far with cyclic values pending. EKG shows NSR, HR 67, with 1st degree AV Block and nonspecific ST abnormality along Lead III. Echocardiogram has also been ordered for assessment of structural function. Pending enzymes and echo results, will need to plan for further ischemic evaluation tomorrow and would make NPO after midnight (already consumed breakfast this AM). We did discuss a catheterization as definitive evaluation would likely be warranted given his symptoms, known CAD as outlined by prior cath 20+ years ago, and continued tobacco use.  - continue ASA, statin, and BB therapy. He is currently pain-free. If he develops recurrent pain or enzymes become elevated,  would initiate Heparin.   2. PVD - known bilateral SFA occlusions by angiography in 07/2016 with medical management recommended. He denies any recent claudication but notes he is not active at baseline.  - continue ASA and statin therapy.   3. HTN - BP variable at 101/56 - 152/72 while in the ED. On HCTZ 12.62m daily, Losartan 2663mdaily, and Lopressor 12.63m163mID as an outpatient. Will order PTA Lopressor and Losartan. Hold HCTZ in case catheterization is pursued this admission.   4. HLD - followed by PCP. FLP in 08/2017 showed total cholesterol of 178, HDL 51, and LDL 114 with Simvastatin having been titrated to 3m27mily but the patient self-reduced back to 40mg54mly due to myalgias. Will recheck FLP. If not at goal of LDL < 70, would challenge with high-intensity statin therapy.   5. Type 2 DM - Repeat Hgb A1c pending. SSI ordered.   - per admitting team.   6. Stage 3 CKD - baseline creatinine 1.3 - 1.4. Elevated to 1.51 on admission. Will hold HCTZ. Repeat BMET in AM.    7. Intermittent Tobacco Use - he reports intermittent tobacco use over the past several years but quit 4 weeks ago. Continued cessation advised.    For questions or updates, please contact [facility] consult [email or web address] for contact info under Cardiology/STEMI.  Signed, BrittErma HeritageC 03/26/2018, 9:37 AM Pager: [phone] note:  Patient seen and examined.  I reviewed his records and discussed the case with Ms. StradAhmed Prima.  Mr. BilliForget patient of Dr. JordaMartinique seen in May 2018.  He has a history of stent and angioplasty to the LAD most recently in 1999 with otherwise residual disease managed medically including a 60 to 70% proximal circumflex stenosis.  He presents to the ER reporting recent recurring episodes of chest discomfort as well as progressive shortness of breath.  Description of chest pain has both typical and atypical features but he does mention that  recently his symptoms feel like prior cardiac angina.  He has had improvement with nitroglycerin.  Also feeling a vague sense of palpitations and mild sense of orthopnea.  He has had no syncope.  On examination in the ER he is in sinus rhythm by telemetry with heart rate in the 70s, intermittent PVCs also noted.  Lungs exhibit decreased breath sounds without wheezing.  Cardiac exam reveals RRR with ectopy and soft systolic murmur.  No pitting edema.  Lab work shows potassium 4.0, BUN 27, creatinine 1.5, troponin I 0.04, heme globin 12.2, platelets 170.  I personally reviewed his ECG which shows sinus rhythm with prolonged PR interval, borderline low voltage in the precordial leads and  nonspecific ST changes.  Chest x-ray reports low lung volumes but no acute process.  Presents with symptoms concerning for unstable angina with increasing dyspnea on exertion and recurring chest discomfort with both typical and atypical features.  Initial troponin I is minimally increased at 0.04.  ECG overall nonspecific.  He has a remotely documented history of CAD with previous stent and angioplasty of the LAD as well as moderate residual circumflex disease back in 1999.  He is being admitted to the hospitalist service.  Would cycle cardiac markers, follow-up on echocardiogram to reassess LVEF.  Hold Cozaar, Hydrodiuril, and metformin in anticipation of diagnostic cardiac catheterization.  We will recheck BMET in a.m. and anticipate transfer to [facility] tomorrow.  Satira Sark, M.D., F.A.C.C.

## 2018-03-26 NOTE — ED Notes (Signed)
Pt ambulated to restroom without difficulty

## 2018-03-26 NOTE — Progress Notes (Signed)
*  PRELIMINARY RESULTS* Echocardiogram 2D Echocardiogram has been performed.  Samuel Germany 03/26/2018, 10:20 AM

## 2018-03-26 NOTE — Care Management Obs Status (Signed)
[place] NOTIFICATION   Patient Details  Name: [name] MRN: [id number] Date of Birth: [birth year]   Medicare Observation Status Notification Given:  Yes    Shelda Altes 03/26/2018, 4:10 PM

## 2018-03-27 ENCOUNTER — Encounter (HOSPITAL_COMMUNITY)
Admission: EM | Disposition: A | Discharge status: Home Health | Payer: Self-pay | Source: Home / Self Care | Attending: Cardiothoracic Surgery

## 2018-03-27 ENCOUNTER — Other Ambulatory Visit: Payer: Self-pay | Admitting: *Deleted

## 2018-03-27 ENCOUNTER — Encounter (HOSPITAL_COMMUNITY): Payer: Self-pay | Admitting: Cardiovascular Disease

## 2018-03-27 ENCOUNTER — Ambulatory Visit (HOSPITAL_COMMUNITY): Payer: Medicare HMO

## 2018-03-27 DIAGNOSIS — Z01810 Encounter for preprocedural cardiovascular examination: Secondary | ICD-10-CM

## 2018-03-27 DIAGNOSIS — I25110 Atherosclerotic heart disease of native coronary artery with unstable angina pectoris: Principal | ICD-10-CM

## 2018-03-27 DIAGNOSIS — I2510 Atherosclerotic heart disease of native coronary artery without angina pectoris: Secondary | ICD-10-CM

## 2018-03-27 DIAGNOSIS — K5000 Crohn's disease of small intestine without complications: Secondary | ICD-10-CM

## 2018-03-27 HISTORY — PX: LEFT HEART CATH AND CORONARY ANGIOGRAPHY: CATH118249

## 2018-03-27 LAB — GLUCOSE, CAPILLARY
Glucose-Capillary: 151 mg/dL — ABNORMAL HIGH (ref 70–99)
Glucose-Capillary: 98 mg/dL (ref 70–99)
Glucose-Capillary: 217 mg/dL — ABNORMAL HIGH (ref 70–99)
Glucose-Capillary: 146 mg/dL — ABNORMAL HIGH (ref 70–99)

## 2018-03-27 LAB — BASIC METABOLIC PANEL
Anion gap: 7 (ref 5–15)
BUN: 23 mg/dL (ref 8–23)
CO2: 25 mmol/L (ref 22–32)
Calcium: 8.9 mg/dL (ref 8.9–10.3)
Chloride: 108 mmol/L (ref 98–111)
Creatinine, Ser: 1.25 mg/dL — ABNORMAL HIGH (ref 0.61–1.24)
GFR calc Af Amer: >60 mL/min (ref >60)
GFR calc non Af Amer: 57 mL/min — ABNORMAL LOW (ref >60)
Glucose, Bld: 168 mg/dL — ABNORMAL HIGH (ref 70–99)
POTASSIUM: 4.2 mmol/L (ref 3.5–5.1)
Sodium: 140 mmol/L (ref 135–145)

## 2018-03-27 SURGERY — LEFT HEART CATH AND CORONARY ANGIOGRAPHY
Anesthesia: LOCAL

## 2018-03-27 MED ORDER — SODIUM CHLORIDE 0.9 % IV SOLN
INTRAVENOUS | Status: DC
  Administered 2018-03-27: 125 mL/h via INTRAVENOUS
  Administered 2018-03-28 – 2018-03-31 (×5): via INTRAVENOUS

## 2018-03-27 MED ORDER — MIDAZOLAM HCL 2 MG/2ML IJ SOLN
INTRAMUSCULAR | Status: DC | PRN
  Administered 2018-03-27: 2 mg via INTRAVENOUS

## 2018-03-27 MED ORDER — HEPARIN (PORCINE) IN NACL 1000-0.9 UT/500ML-% IV SOLN
INTRAVENOUS | Status: DC | PRN
  Administered 2018-03-27: 500 mL

## 2018-03-27 MED ORDER — SODIUM CHLORIDE 0.9 % IV SOLN: 250.0000 mL | INTRAVENOUS | Status: DC | PRN

## 2018-03-27 MED ORDER — SODIUM CHLORIDE 0.9 % WEIGHT BASED INFUSION
3.0000 mL/kg/h | INTRAVENOUS | Status: DC
  Administered 2018-03-27: 3 mL/kg/h via INTRAVENOUS

## 2018-03-27 MED ORDER — HEPARIN (PORCINE) 25000 UT/250ML-% IV SOLN
1050.0000 [IU]/h | INTRAVENOUS | Status: DC
  Administered 2018-03-27: 1000 [IU]/h via INTRAVENOUS
  Administered 2018-03-28 – 2018-03-31 (×4): 1050 [IU]/h via INTRAVENOUS
  Filled 2018-03-27 (×6): qty 250

## 2018-03-27 MED ORDER — VERAPAMIL HCL 2.5 MG/ML IV SOLN
INTRAVENOUS | Status: AC
  Filled 2018-03-27: qty 2

## 2018-03-27 MED ORDER — SODIUM CHLORIDE 0.9% FLUSH: 3.0000 mL | INTRAVENOUS | Status: DC | PRN

## 2018-03-27 MED ORDER — LIDOCAINE HCL (PF) 1 % IJ SOLN
INTRAMUSCULAR | Status: AC
  Filled 2018-03-27: qty 30

## 2018-03-27 MED ORDER — ACETAMINOPHEN 325 MG PO TABS: 650.0000 mg | ORAL_TABLET | ORAL | Status: DC | PRN

## 2018-03-27 MED ORDER — SODIUM CHLORIDE 0.9% FLUSH
3.0000 mL | Freq: Two times a day (BID) | INTRAVENOUS | Status: DC
  Administered 2018-03-30: 3 mL via INTRAVENOUS

## 2018-03-27 MED ORDER — MIDAZOLAM HCL 2 MG/2ML IJ SOLN
INTRAMUSCULAR | Status: AC
  Filled 2018-03-27: qty 2

## 2018-03-27 MED ORDER — ASPIRIN 81 MG PO CHEW: 81.0000 mg | CHEWABLE_TABLET | ORAL | Status: DC

## 2018-03-27 MED ORDER — HEPARIN SODIUM (PORCINE) 1000 UNIT/ML IJ SOLN
INTRAMUSCULAR | Status: DC | PRN
  Administered 2018-03-27: 4000 [IU] via INTRAVENOUS

## 2018-03-27 MED ORDER — ATORVASTATIN CALCIUM 80 MG PO TABS
80.0000 mg | ORAL_TABLET | Freq: Every day | ORAL | Status: DC
  Administered 2018-03-27 – 2018-04-08 (×12): 80 mg via ORAL
  Filled 2018-03-27 (×12): qty 1

## 2018-03-27 MED ORDER — SODIUM CHLORIDE 0.9 % WEIGHT BASED INFUSION
1.0000 mL/kg/h | INTRAVENOUS | Status: DC
  Administered 2018-03-27: 1 mL/kg/h via INTRAVENOUS

## 2018-03-27 MED ORDER — ASPIRIN 81 MG PO CHEW
81.0000 mg | CHEWABLE_TABLET | Freq: Every day | ORAL | Status: DC
  Administered 2018-03-28 – 2018-03-31 (×4): 81 mg via ORAL
  Filled 2018-03-27 (×4): qty 1

## 2018-03-27 MED ORDER — VERAPAMIL HCL 2.5 MG/ML IV SOLN
INTRAVENOUS | Status: DC | PRN
  Administered 2018-03-27: 10:00:00 via INTRA_ARTERIAL

## 2018-03-27 MED ORDER — SODIUM CHLORIDE 0.9 % WEIGHT BASED INFUSION: 3.0000 mL/kg/h | INTRAVENOUS | Status: DC

## 2018-03-27 MED ORDER — LIDOCAINE HCL (PF) 1 % IJ SOLN
INTRAMUSCULAR | Status: DC | PRN
  Administered 2018-03-27: 2 mL

## 2018-03-27 MED ORDER — ONDANSETRON HCL 4 MG/2ML IJ SOLN: 4.0000 mg | Freq: Four times a day (QID) | INTRAMUSCULAR | Status: DC | PRN

## 2018-03-27 MED ORDER — IOHEXOL 350 MG/ML SOLN
INTRAVENOUS | Status: DC | PRN
  Administered 2018-03-27: 60 mL via INTRA_ARTERIAL

## 2018-03-27 MED ORDER — HEPARIN (PORCINE) IN NACL 1000-0.9 UT/500ML-% IV SOLN
INTRAVENOUS | Status: AC
  Filled 2018-03-27: qty 1000

## 2018-03-27 MED ORDER — FENTANYL CITRATE (PF) 100 MCG/2ML IJ SOLN
INTRAMUSCULAR | Status: AC
  Filled 2018-03-27: qty 2

## 2018-03-27 MED ORDER — ASPIRIN 81 MG PO CHEW
81.0000 mg | CHEWABLE_TABLET | ORAL | Status: AC
  Administered 2018-03-27: 81 mg via ORAL
  Filled 2018-03-27: qty 1

## 2018-03-27 MED ORDER — SODIUM CHLORIDE 0.9% FLUSH: 3.0000 mL | Freq: Two times a day (BID) | INTRAVENOUS | Status: DC

## 2018-03-27 MED ORDER — ASPIRIN EC 81 MG PO TBEC: 81.0000 mg | DELAYED_RELEASE_TABLET | Freq: Every day | ORAL | Status: DC

## 2018-03-27 MED ORDER — FENTANYL CITRATE (PF) 100 MCG/2ML IJ SOLN
INTRAMUSCULAR | Status: DC | PRN
  Administered 2018-03-27: 25 ug via INTRAVENOUS

## 2018-03-27 MED ORDER — SODIUM CHLORIDE 0.9 % WEIGHT BASED INFUSION: 1.0000 mL/kg/h | INTRAVENOUS | Status: DC

## 2018-03-27 SURGICAL SUPPLY — 10 items
CATH OPTITORQUE TIG 4.0 5F (CATHETERS) ×1 IMPLANT
DEVICE RAD COMP TR BAND LRG (VASCULAR PRODUCTS) ×1 IMPLANT
GLIDESHEATH SLEND SS 6F .021 (SHEATH) ×1 IMPLANT
GUIDEWIRE INQWIRE 1.5J.035X260 (WIRE) IMPLANT
INQWIRE 1.5J .035X260CM (WIRE) ×2
KIT HEART LEFT (KITS) ×2 IMPLANT
PACK CARDIAC CATHETERIZATION (CUSTOM PROCEDURE TRAY) ×2 IMPLANT
SHEATH PROBE COVER 6X72 (BAG) ×1 IMPLANT
TRANSDUCER W/STOPCOCK (MISCELLANEOUS) ×2 IMPLANT
TUBING CIL FLEX 10 FLL-RA (TUBING) ×2 IMPLANT

## 2018-03-27 NOTE — H&P (View-Only) (Signed)
Progress Note  Patient Name: [name] Date of Encounter: 03/27/2018  Primary Cardiologist: Peter Martinique, MD   Subjective   He denies any recurrent chest pain overnight or this morning. Breathing at baseline. Has been NPO since midnight for cath. Reports being anxious about the procedure but is ready to get it over with.   Inpatient Medications    Scheduled Meds: . aspirin  325 mg Oral Daily  . docusate sodium  100 mg Oral Daily  . enoxaparin (LOVENOX) injection  40 mg Subcutaneous Q24H  . insulin aspart  0-15 Units Subcutaneous TID WC  . mesalamine  0.375 g Oral BID  . metoprolol tartrate  12.5 mg Oral BID  . pantoprazole  40 mg Oral Daily  . PARoxetine  20 mg Oral Daily  . simvastatin  40 mg Oral q1800  . sodium chloride flush  3 mL Intravenous Q12H   Continuous Infusions: . sodium chloride    . sodium chloride 1 mL/kg/hr (03/27/18 0636)   PRN Meds: sodium chloride, acetaminophen **OR** acetaminophen, albuterol, hydrOXYzine, ondansetron **OR** ondansetron (ZOFRAN) IV, sodium chloride flush   Vital Signs    Vitals:   03/26/18 1948 03/26/18 2102 03/27/18 0032 03/27/18 0348  BP:  (!) 133/52 140/75 (!) 149/69  Pulse:  64 70 (!) 59  Resp:  18 16 17   Temp:  98.3 F (36.8 C) 98.3 F (36.8 C) 98.5 F (36.9 C)  TempSrc:  Oral Oral Oral  SpO2: 95% 97% 98% 96%  Weight:      Height:       No intake or output data in the 24 hours ending 03/27/18 0809 Filed Weights   03/26/18 1558  Weight: 76 kg    Telemetry    NSR, HR in 50's to 80's with occasional PAC's and PVC's.  - Personally Reviewed  ECG    No new tracings.   Physical Exam   General: Well developed, well nourished Caucasian male appearing in no acute distress. Head: Normocephalic, atraumatic.  Neck: Supple without bruits, JVD not elevated. Lungs:  Resp regular and unlabored, CTA without wheezing or rales. Heart: RRR, S1, S2, no S3, S4, or murmur; no rub. Abdomen: Soft, non-tender,  non-distended with normoactive bowel sounds. No hepatomegaly. No rebound/guarding. No obvious abdominal masses. Extremities: No clubbing, cyanosis, or lower extremity edema. Distal pedal pulses are 2+ bilaterally. Neuro: Alert and oriented X 3. Moves all extremities spontaneously. Psych: Normal affect.  Labs    Chemistry Recent Labs  Lab 03/26/18 0216 03/27/18 0500  NA 138 140  K 4.0 4.2  CL 107 108  CO2 22 25  GLUCOSE 221* 168*  BUN 27* 23  CREATININE 1.51* 1.25*  CALCIUM 8.9 8.9  GFRNONAA 45* 57*  GFRAA 53* >60  ANIONGAP 9 7     Hematology Recent Labs  Lab 03/26/18 0216  WBC 9.2  RBC 3.85*  HGB 12.2*  HCT 37.7*  MCV 97.9  MCH 31.7  MCHC 32.4  RDW 12.4  PLT 170    Cardiac Enzymes Recent Labs  Lab 03/26/18 1016 03/26/18 1539  TROPONINI 0.04* 0.03*    Recent Labs  Lab 03/26/18 0225 03/26/18 0425  TROPIPOC 0.03 0.02     BNPNo results for input(s): BNP, PROBNP in the last 168 hours.   DDimer No results for input(s): DDIMER in the last 168 hours.   Radiology    Dg Chest 2 View  Result Date: 03/26/2018 CLINICAL DATA:  Upper chest pain EXAM: CHEST - 2 VIEW COMPARISON:  07/05/2016 FINDINGS: Low lung volumes. Heart size accentuated by the low volumes and AP nature of the study, within normal limits. No confluent airspace opacities or effusions. No acute bony abnormality. IMPRESSION: Low lung volumes.  No active disease. Electronically Signed   By: Rolm Baptise M.D.   On: 03/26/2018 02:20    Cardiac Studies   Echocardiogram: 03/26/2018 Study Conclusions  - Left ventricle: The cavity size was normal. Wall thickness was   normal. Systolic function was normal. The estimated ejection   fraction was in the range of 60% to 65%. There is akinesis of the   mid-apicalanteroseptal myocardium. Doppler parameters are   consistent with abnormal left ventricular relaxation (grade 1   diastolic dysfunction). - Aortic valve: Trileaflet; mildly calcified leaflets.  There was   trivial regurgitation. - Mitral valve: Mildly calcified annulus. There was trivial   regurgitation. - Left atrium: The atrium was at the upper limits of normal in   size. - Right atrium: Central venous pressure (est): 3 mm Hg. - Atrial septum: No defect or patent foramen ovale was identified. - Tricuspid valve: There was physiologic regurgitation. - Pulmonary arteries: Systolic pressure could not be accurately   estimated. - Pericardium, extracardiac: There was no pericardial effusion.  Patient Profile     72 y.o. male w/ PMH of CAD (s/p stenting of LAD in 1996 and angioplasty alone to LAD in 1999), PAD (known bilateral SFA occlusions by angiography in 07/2016), HTN, HLD, Type 2 DM, depression and intermittent tobacco use who presented to [facility] ED on 03/26/2018 for evaluation of chest pain.   Assessment & Plan    1. Chest Pain in Setting of Known CAD - s/p stenting to the LAD in 1996 and angioplasty in 1999. By review of scanned cath reports, he had 30% Ost LCx, 60-70% Prox-LCx, and 30% RCA stenoses at the time of his last catheterization in 1999 as well.  - chest pain symptoms over the past 3 weeks had atypical qualities but the pain he developed the day of admission resembled his prior angina and occurred with exertion.  - cyclic troponin values have been flat, peaking at 0.04. EKG with no diagnostic changes. Echo shows a preserved EF of 60-65% but he was noted to have akinesis of the mid-apicalanteroseptal myocardium. - Given his presenting symptoms, known CAD, and WMA on echocardiogram a cardiac catheterization has been scheduled for this morning for definitive evaluation. Risks and benefits reviewed and he agrees to proceed.  - continue ASA (reduced from 336m to 833mdaily upon admission), statin, and BB therapy.   2. PVD - known bilateral SFA occlusions by angiography in 07/2016 with medical management recommended.  - remains on ASA and statin therapy.   3.  HTN - BP variable at 123/52 - 153/81 within the past 24 hours. PTA HCTZ 12.41m52maily and Losartan 241m67mily currently held in anticipation of cath. Would restart following procedure if creatinine remains stable. Continue Lopressor 12.41mg 28m. Would not further titrate at this time as HR is in the 50's at times by review of telemetry.  4. HLD - followed by PCP. FLP in 08/2017 showed total cholesterol of 178, HDL 51, and LDL 114 with Simvastatin having been titrated to 80mg 39my but the patient self-reduced back to 40mg d50m due to myalgias. Repeat FLP pending. If not at goal, would re-challenge with a higher intensity statin.    5. Type 2 DM - Hgb A1c at 6.9 this admission. Metformin held in anticipation of cath.  SSI ordered.   - per admitting team.   6. Stage 3 CKD - baseline creatinine 1.3 - 1.4. Elevated to 1.51 on admission, improved to 1.25 this AM. Losartan and HCTZ currently held in anticipation of cath. Receiving pre-cath fluids.   7. Intermittent Tobacco Use - reports having quit smoking 4 weeks ago. Congratulated on this with continued cessation advised.    For questions or updates, please contact [place] Please consult [email or web address] for contact info under Cardiology/STEMI.   Arna Medici , PA-C 8:09 AM 03/27/2018 Pager: [phone]   Attending note:  Patient seen and examined.  Agree with above assessment by Ms. Strader PA-C.  Patient has been scheduled for a diagnostic cardiac catheterization at [facility] this morning.  He presented with increasing dyspnea on exertion and occurring episodes of chest pain with both typical and atypical features.  Troponin I levels have been minimally increased at 0.04 and in flat pattern.  Follow-up echocardiogram shows overall preserved LVEF however there is an akinetic segment in the mid to apical anteroseptal wall that is new in comparison to previous evaluation.  Occasional PACs and PVCs noted by telemetry.  He  has a history of remote LAD stent placement and angioplasty with residual circumflex disease, last assessed in 1999.  Also has PAD with bilateral SFA occlusions.  He will be transferred today for further cardiac testing, evaluation of coronary anatomy for any potential revascularization options, otherwise optimization of medical therapy.  Of note, losartan and HCTZ were held with mild renal insufficiency, creatinine 1.51 on admission but down to 1.25 this morning.  He did receive IV fluids as well.  Satira Sark, M.D., F.A.C.C.

## 2018-03-27 NOTE — Progress Notes (Signed)
PROGRESS NOTE  [name] DIY:[id number] DOB: 05/25/1945 DOA: 03/26/2018 PCP: [name], MD  Brief History:  72 year old male with a history of prostate cancer, anxiety, Crohn's disease, diabetes mellitus, hypertension, hyperlipidemia presenting with substernal chest discomfort that began on the evening of 03/25/2018.  The patient felt that it occurred when he was walking from his couch back to bed.  However he had increasing chest discomfort with shortness of breath even while laying in bed.  He took one sublingual nitroglycerin which relieved his chest discomfort.  He stated that the pain radiated to his jaw as well as his left arm.  However, the patient gives a history of having intermittent chest discomfort over the past 4 weeks intermittently for which she has been drinking some vinegar giving some relief to his pain.  He denies any Fevers, chills, coughing, hemoptysis, nausea, vomiting, diarrhea, abdominal pain. Assessment/Plan: Chest pain in pt with CAD -Mostly atypical by clinical history with some typical hx from episode am 12/4 -Cycle troponins--flat -Echocardiogram--EF 60-65%; AK mid apicalanteroseptal, trivial AI, MR -Consult cardiology-->transfer to [facility] for heart cath -Continue aspirin  Diabetes mellitus type 2 -NovoLog sliding scale -Hemoglobin A1c--6.9 -Patient states that he no longer takes glipizide -holding metformin  Essential hypertension -Holding losartan and HCTZ in preparation for cath -continue metoprolol tartrate  CKD stage III -Baseline creatinine 1.2-1.5  Hyperlipidemia -Continue statin  Anxiety/depression -Continue paroxetine -vistaril prn     Disposition Plan:   Transfer to [facility] for cath Family Communication:   Wife updated at bedside  Consultants:  cardiology  Code Status:  FULL  DVT Prophylaxis:  Basin Lovenox  Total time spent 35 minutes.  Greater than 50% spent face to face counseling and  coordinating care. 0730 to 0805   Procedures: As Listed in Progress Note Above  Antibiotics: None     Subjective: Patient denies fevers, chills, headache, chest pain, dyspnea, nausea, vomiting, diarrhea, abdominal pain, dysuria, hematuria, hematochezia, and melena.   Objective: Vitals:   03/26/18 1948 03/26/18 2102 03/27/18 0032 03/27/18 0348  BP:  (!) 133/52 140/75 (!) 149/69  Pulse:  64 70 (!) 59  Resp:  18 16 17   Temp:  98.3 F (36.8 C) 98.3 F (36.8 C) 98.5 F (36.9 C)  TempSrc:  Oral Oral Oral  SpO2: 95% 97% 98% 96%  Weight:      Height:       No intake or output data in the 24 hours ending 03/27/18 0825 Weight change:  Exam:   General:  Pt is alert, follows commands appropriately, not in acute distress  HEENT: No icterus, No thrush, No neck mass, [place]/AT  Cardiovascular: RRR, S1/S2, no rubs, no gallops  Respiratory: CTA bilaterally, no wheezing, no crackles, no rhonchi    Data Reviewed: I have personally reviewed following labs and imaging studies Basic Metabolic Panel: Recent Labs  Lab 03/26/18 0216 03/27/18 0500  NA 138 140  K 4.0 4.2  CL 107 108  CO2 22 25  GLUCOSE 221* 168*  BUN 27* 23  CREATININE 1.51* 1.25*  CALCIUM 8.9 8.9  MG 1.9  --   PHOS 3.3  --    Liver Function Tests: No results for input(s): AST, ALT, ALKPHOS, BILITOT, PROT, ALBUMIN in the last 168 hours. No results for input(s): LIPASE, AMYLASE in the last 168 hours. No results for input(s): AMMONIA in the last 168 hours. Coagulation Profile: No results for input(s): INR, PROTIME in the last 168  hours. CBC: Recent Labs  Lab 03/26/18 0216  WBC 9.2  HGB 12.2*  HCT 37.7*  MCV 97.9  PLT 170   Cardiac Enzymes: Recent Labs  Lab 03/26/18 1016 03/26/18 1539  TROPONINI 0.04* 0.03*   BNP: Invalid input(s): POCBNP CBG: Recent Labs  Lab 03/26/18 1657 03/26/18 2103 03/27/18 0744  GLUCAP 99 194* 151*   HbA1C: Recent Labs    03/26/18 0216  HGBA1C 6.9*    Urine analysis:    Component Value Date/Time   COLORURINE YELLOW 12/20/2008 1931   APPEARANCEUR CLEAR 12/20/2008 1931   LABSPEC 1.020 12/20/2008 1931   PHURINE 5.5 12/20/2008 1931   GLUCOSEU NEGATIVE 12/20/2008 1931   HGBUR NEGATIVE 12/20/2008 1931   BILIRUBINUR NEGATIVE 12/20/2008 1931   KETONESUR NEGATIVE 12/20/2008 1931   PROTEINUR NEGATIVE 12/20/2008 1931   UROBILINOGEN 0.2 12/20/2008 1931   NITRITE NEGATIVE 12/20/2008 1931   LEUKOCYTESUR  12/20/2008 1931    NEGATIVE MICROSCOPIC NOT DONE ON URINES WITH NEGATIVE PROTEIN, BLOOD, LEUKOCYTES, NITRITE, OR GLUCOSE <1000 mg/dL.   Sepsis Labs: @LABRCNTIP (procalcitonin:4,lacticidven:4) )No results found for this or any previous visit (from the past 240 hour(s)).   Scheduled Meds: . [START ON 03/28/2018] aspirin EC  81 mg Oral Daily  . docusate sodium  100 mg Oral Daily  . enoxaparin (LOVENOX) injection  40 mg Subcutaneous Q24H  . insulin aspart  0-15 Units Subcutaneous TID WC  . mesalamine  0.375 g Oral BID  . metoprolol tartrate  12.5 mg Oral BID  . pantoprazole  40 mg Oral Daily  . PARoxetine  20 mg Oral Daily  . simvastatin  40 mg Oral q1800  . sodium chloride flush  3 mL Intravenous Q12H   Continuous Infusions: . sodium chloride    . sodium chloride 1 mL/kg/hr (03/27/18 0636)    Procedures/Studies: Dg Chest 2 View  Result Date: 03/26/2018 CLINICAL DATA:  Upper chest pain EXAM: CHEST - 2 VIEW COMPARISON:  07/05/2016 FINDINGS: Low lung volumes. Heart size accentuated by the low volumes and AP nature of the study, within normal limits. No confluent airspace opacities or effusions. No acute bony abnormality. IMPRESSION: Low lung volumes.  No active disease. Electronically Signed   By: Rolm Baptise M.D.   On: 03/26/2018 02:20    Orson Eva, DO  [facility] Pager [phone]  If 7PM-7AM, please contact night-coverage [email or web address] Password [email or web address] 03/27/2018, 8:25 AM   LOS: 0 days

## 2018-03-27 NOTE — Progress Notes (Signed)
ANTICOAGULATION CONSULT NOTE - Initial Consult  Pharmacy Consult for heparin Indication: chest pain/ACS  Allergies  Allergen Reactions  . Flomax [Tamsulosin Hcl]     High dose   . Morphine And Related     Patient Measurements: Height: 5' 11"  (180.3 cm) Weight: 167 lb 8.8 oz (76 kg) IBW/kg (Calculated) : 75.3 Heparin Dosing Weight: 75kg  Vital Signs: Temp: 98.5 F (36.9 C) (12/05 0348) Temp Source: Oral (12/05 0348) BP: 149/69 (12/05 0348) Pulse Rate: 54 (12/05 1119)  Labs: Recent Labs    03/26/18 0216 03/26/18 1016 03/26/18 1539 03/27/18 0500  HGB 12.2*  --   --   --   HCT 37.7*  --   --   --   PLT 170  --   --   --   CREATININE 1.51*  --   --  1.25*  TROPONINI  --  0.04* 0.03*  --     Estimated Creatinine Clearance: 56.9 mL/min (A) (by C-G formula based on SCr of 1.25 mg/dL (H)).   Medical History: Past Medical History:  Diagnosis Date  . Adenocarcinoma of prostate ([place])   . Anxiety   . CAD (coronary artery disease)    a. s/p prior stenting of LAD in 1996 and angioplasty alone in 1999 to LAD by review of prior notes.   . 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 Crohn's disease ([place])   . CTS (carpal tunnel syndrome)    Mild  . Depression   . Diabetes mellitus without complication (HCC)    Type 2  . Hyperlipidemia   . Hypertension   . Kidney stone   . Optic neuritis     Assessment: 42 yom s/p cath this morning found to have multivessel CAD, will consult surgical team and start heparin this afternoon. CBC within normal limits prior to cath, not on anticoagulation prior to admit.   Goal of Therapy:  Heparin level 0.3-0.7 units/ml Monitor platelets by anticoagulation protocol: Yes   Plan:  Start heparin infusion at 1000 units/hr Check anti-Xa level in 8 hours and daily while on heparin Continue to monitor H&H and platelets Start this evening [place] PharmD., BCPS Clinical Pharmacist 03/27/2018 12:03 PM

## 2018-03-27 NOTE — Interval H&P Note (Signed)
Cath Lab Visit (complete for each Cath Lab visit)  Clinical Evaluation Leading to the Procedure:   ACS: No.  Non-ACS:    Anginal Classification: CCS III  Anti-ischemic medical therapy: Minimal Therapy (1 class of medications)  Non-Invasive Test Results: No non-invasive testing performed  Prior CABG: No previous CABG      History and Physical Interval Note:  03/27/2018 9:48 AM  [name]  has presented today for surgery, with the diagnosis of ua  The various methods of treatment have been discussed with the patient and family. After consideration of risks, benefits and other options for treatment, the patient has consented to  Procedure(s): LEFT HEART CATH AND CORONARY ANGIOGRAPHY (N/A) as a surgical intervention .  The patient's history has been reviewed, patient examined, no change in status, stable for surgery.  I have reviewed the patient's chart and labs.  Questions were answered to the patient's satisfaction.     Shelva Majestic

## 2018-03-27 NOTE — Progress Notes (Signed)
[facility] here to transport patient to [facility] for heart cath

## 2018-03-27 NOTE — Progress Notes (Signed)
Pre CABG evaluation exam completed.  Preliminary notes is available in CV PROC under chart review.    Right Carotid:Velocities in the right ICA are consistent with a 1-39% stenosis.   Left Carotid: Velocities in the left ICA are consistent with a 1-39% stenosis.     Vertebrals: Bilateral vertebral arteries demonstrate antegrade flow. Left vertebral artery demonstrates high resistant flow.    Right ABI: Resting right ankle-brachial index indicates moderate right lower extremity arterial disease. The right toe-brachial index is abnormal.  Left ABI: Resting left ankle-brachial index indicates severe left lower extremity arterial disease. The left toe-brachial index is abnormal.   Right Upper Extremity: Doppler waveform obliterate with right radial compression. Doppler waveforms remain within normal limits with right ulnar compression.  Left Upper Extremity: Doppler waveforms remain within normal limits with left radial compression. Doppler waveforms decrease <50% with left ulnar compression.  Lauris Serviss H Aram Domzalski(RDMS RVT) 03/27/18 4:50 PM

## 2018-03-27 NOTE — Progress Notes (Signed)
Report given to [facility] at this time.   

## 2018-03-27 NOTE — Progress Notes (Addendum)
Progress Note  Patient Name: [name] Date of Encounter: 03/27/2018  Primary Cardiologist: Peter Martinique, MD   Subjective   He denies any recurrent chest pain overnight or this morning. Breathing at baseline. Has been NPO since midnight for cath. Reports being anxious about the procedure but is ready to get it over with.   Inpatient Medications    Scheduled Meds: . aspirin  325 mg Oral Daily  . docusate sodium  100 mg Oral Daily  . enoxaparin (LOVENOX) injection  40 mg Subcutaneous Q24H  . insulin aspart  0-15 Units Subcutaneous TID WC  . mesalamine  0.375 g Oral BID  . metoprolol tartrate  12.5 mg Oral BID  . pantoprazole  40 mg Oral Daily  . PARoxetine  20 mg Oral Daily  . simvastatin  40 mg Oral q1800  . sodium chloride flush  3 mL Intravenous Q12H   Continuous Infusions: . sodium chloride    . sodium chloride 1 mL/kg/hr (03/27/18 0636)   PRN Meds: sodium chloride, acetaminophen **OR** acetaminophen, albuterol, hydrOXYzine, ondansetron **OR** ondansetron (ZOFRAN) IV, sodium chloride flush   Vital Signs    Vitals:   03/26/18 1948 03/26/18 2102 03/27/18 0032 03/27/18 0348  BP:  (!) 133/52 140/75 (!) 149/69  Pulse:  64 70 (!) 59  Resp:  18 16 17   Temp:  98.3 F (36.8 C) 98.3 F (36.8 C) 98.5 F (36.9 C)  TempSrc:  Oral Oral Oral  SpO2: 95% 97% 98% 96%  Weight:      Height:       No intake or output data in the 24 hours ending 03/27/18 0809 Filed Weights   03/26/18 1558  Weight: 76 kg    Telemetry    NSR, HR in 50's to 80's with occasional PAC's and PVC's.  - Personally Reviewed  ECG    No new tracings.   Physical Exam   General: Well developed, well nourished Caucasian male appearing in no acute distress. Head: Normocephalic, atraumatic.  Neck: Supple without bruits, JVD not elevated. Lungs:  Resp regular and unlabored, CTA without wheezing or rales. Heart: RRR, S1, S2, no S3, S4, or murmur; no rub. Abdomen: Soft, non-tender,  non-distended with normoactive bowel sounds. No hepatomegaly. No rebound/guarding. No obvious abdominal masses. Extremities: No clubbing, cyanosis, or lower extremity edema. Distal pedal pulses are 2+ bilaterally. Neuro: Alert and oriented X 3. Moves all extremities spontaneously. Psych: Normal affect.  Labs    Chemistry Recent Labs  Lab 03/26/18 0216 03/27/18 0500  NA 138 140  K 4.0 4.2  CL 107 108  CO2 22 25  GLUCOSE 221* 168*  BUN 27* 23  CREATININE 1.51* 1.25*  CALCIUM 8.9 8.9  GFRNONAA 45* 57*  GFRAA 53* >60  ANIONGAP 9 7     Hematology Recent Labs  Lab 03/26/18 0216  WBC 9.2  RBC 3.85*  HGB 12.2*  HCT 37.7*  MCV 97.9  MCH 31.7  MCHC 32.4  RDW 12.4  PLT 170    Cardiac Enzymes Recent Labs  Lab 03/26/18 1016 03/26/18 1539  TROPONINI 0.04* 0.03*    Recent Labs  Lab 03/26/18 0225 03/26/18 0425  TROPIPOC 0.03 0.02     BNPNo results for input(s): BNP, PROBNP in the last 168 hours.   DDimer No results for input(s): DDIMER in the last 168 hours.   Radiology    Dg Chest 2 View  Result Date: 03/26/2018 CLINICAL DATA:  Upper chest pain EXAM: CHEST - 2 VIEW COMPARISON:  07/05/2016 FINDINGS: Low lung volumes. Heart size accentuated by the low volumes and AP nature of the study, within normal limits. No confluent airspace opacities or effusions. No acute bony abnormality. IMPRESSION: Low lung volumes.  No active disease. Electronically Signed   By: Rolm Baptise M.D.   On: 03/26/2018 02:20    Cardiac Studies   Echocardiogram: 03/26/2018 Study Conclusions  - Left ventricle: The cavity size was normal. Wall thickness was   normal. Systolic function was normal. The estimated ejection   fraction was in the range of 60% to 65%. There is akinesis of the   mid-apicalanteroseptal myocardium. Doppler parameters are   consistent with abnormal left ventricular relaxation (grade 1   diastolic dysfunction). - Aortic valve: Trileaflet; mildly calcified leaflets.  There was   trivial regurgitation. - Mitral valve: Mildly calcified annulus. There was trivial   regurgitation. - Left atrium: The atrium was at the upper limits of normal in   size. - Right atrium: Central venous pressure (est): 3 mm Hg. - Atrial septum: No defect or patent foramen ovale was identified. - Tricuspid valve: There was physiologic regurgitation. - Pulmonary arteries: Systolic pressure could not be accurately   estimated. - Pericardium, extracardiac: There was no pericardial effusion.  Patient Profile     72 y.o. male w/ PMH of CAD (s/p stenting of LAD in 1996 and angioplasty alone to LAD in 1999), PAD (known bilateral SFA occlusions by angiography in 07/2016), HTN, HLD, Type 2 DM, depression and intermittent tobacco use who presented to [facility] ED on 03/26/2018 for evaluation of chest pain.   Assessment & Plan    1. Chest Pain in Setting of Known CAD - s/p stenting to the LAD in 1996 and angioplasty in 1999. By review of scanned cath reports, he had 30% Ost LCx, 60-70% Prox-LCx, and 30% RCA stenoses at the time of his last catheterization in 1999 as well.  - chest pain symptoms over the past 3 weeks had atypical qualities but the pain he developed the day of admission resembled his prior angina and occurred with exertion.  - cyclic troponin values have been flat, peaking at 0.04. EKG with no diagnostic changes. Echo shows a preserved EF of 60-65% but he was noted to have akinesis of the mid-apicalanteroseptal myocardium. - Given his presenting symptoms, known CAD, and WMA on echocardiogram a cardiac catheterization has been scheduled for this morning for definitive evaluation. Risks and benefits reviewed and he agrees to proceed.  - continue ASA (reduced from 36m to 867mdaily upon admission), statin, and BB therapy.   2. PVD - known bilateral SFA occlusions by angiography in 07/2016 with medical management recommended.  - remains on ASA and statin therapy.   3.  HTN - BP variable at 123/52 - 153/81 within the past 24 hours. PTA HCTZ 12.72m27maily and Losartan 272m20mily currently held in anticipation of cath. Would restart following procedure if creatinine remains stable. Continue Lopressor 12.72mg 29m. Would not further titrate at this time as HR is in the 50's at times by review of telemetry.  4. HLD - followed by PCP. FLP in 08/2017 showed total cholesterol of 178, HDL 51, and LDL 114 with Simvastatin having been titrated to 80mg 82my but the patient self-reduced back to 40mg d74m due to myalgias. Repeat FLP pending. If not at goal, would re-challenge with a higher intensity statin.    5. Type 2 DM - Hgb A1c at 6.9 this admission. Metformin held in anticipation of cath.  SSI ordered.   - per admitting team.   6. Stage 3 CKD - baseline creatinine 1.3 - 1.4. Elevated to 1.51 on admission, improved to 1.25 this AM. Losartan and HCTZ currently held in anticipation of cath. Receiving pre-cath fluids.   7. Intermittent Tobacco Use - reports having quit smoking 4 weeks ago. Congratulated on this with continued cessation advised.    For questions or updates, please contact [facility] Please consult [email or web address] for contact info under Cardiology/STEMI.   Arna Medici , PA-C 8:09 AM 03/27/2018 Pager: [phone]   Attending note:  Patient seen and examined.  Agree with above assessment by Ms. Strader PA-C.  Patient has been scheduled for a diagnostic cardiac catheterization at [facility] this morning.  He presented with increasing dyspnea on exertion and occurring episodes of chest pain with both typical and atypical features.  Troponin I levels have been minimally increased at 0.04 and in flat pattern.  Follow-up echocardiogram shows overall preserved LVEF however there is an akinetic segment in the mid to apical anteroseptal wall that is new in comparison to previous evaluation.  Occasional PACs and PVCs noted by telemetry.  He  has a history of remote LAD stent placement and angioplasty with residual circumflex disease, last assessed in 1999.  Also has PAD with bilateral SFA occlusions.  He will be transferred today for further cardiac testing, evaluation of coronary anatomy for any potential revascularization options, otherwise optimization of medical therapy.  Of note, losartan and HCTZ were held with mild renal insufficiency, creatinine 1.51 on admission but down to 1.25 this morning.  He did receive IV fluids as well.  Satira Sark, M.D., F.A.C.C.

## 2018-03-28 ENCOUNTER — Encounter (HOSPITAL_COMMUNITY): Payer: Self-pay | Admitting: Cardiovascular Disease

## 2018-03-28 ENCOUNTER — Inpatient Hospital Stay (HOSPITAL_COMMUNITY): Payer: Medicare HMO

## 2018-03-28 DIAGNOSIS — E871 Hypo-osmolality and hyponatremia: Secondary | ICD-10-CM | POA: Diagnosis not present

## 2018-03-28 DIAGNOSIS — J90 Pleural effusion, not elsewhere classified: Secondary | ICD-10-CM | POA: Diagnosis present

## 2018-03-28 DIAGNOSIS — F419 Anxiety disorder, unspecified: Secondary | ICD-10-CM | POA: Diagnosis present

## 2018-03-28 DIAGNOSIS — I129 Hypertensive chronic kidney disease with stage 1 through stage 4 chronic kidney disease, or unspecified chronic kidney disease: Secondary | ICD-10-CM | POA: Diagnosis present

## 2018-03-28 DIAGNOSIS — Y831 Surgical operation with implant of artificial internal device as the cause of abnormal reaction of the patient, or of later complication, without mention of misadventure at the time of the procedure: Secondary | ICD-10-CM | POA: Diagnosis present

## 2018-03-28 DIAGNOSIS — J9811 Atelectasis: Secondary | ICD-10-CM | POA: Diagnosis not present

## 2018-03-28 DIAGNOSIS — Z885 Allergy status to narcotic agent status: Secondary | ICD-10-CM | POA: Diagnosis not present

## 2018-03-28 DIAGNOSIS — D689 Coagulation defect, unspecified: Secondary | ICD-10-CM | POA: Diagnosis present

## 2018-03-28 DIAGNOSIS — T82855A Stenosis of coronary artery stent, initial encounter: Secondary | ICD-10-CM | POA: Diagnosis present

## 2018-03-28 DIAGNOSIS — N183 Chronic kidney disease, stage 3 (moderate): Secondary | ICD-10-CM | POA: Diagnosis present

## 2018-03-28 DIAGNOSIS — I70202 Unspecified atherosclerosis of native arteries of extremities, left leg: Secondary | ICD-10-CM | POA: Diagnosis present

## 2018-03-28 DIAGNOSIS — K5090 Crohn's disease, unspecified, without complications: Secondary | ICD-10-CM | POA: Diagnosis present

## 2018-03-28 DIAGNOSIS — K520 Gastroenteritis and colitis due to radiation: Secondary | ICD-10-CM | POA: Diagnosis present

## 2018-03-28 DIAGNOSIS — Z951 Presence of aortocoronary bypass graft: Secondary | ICD-10-CM | POA: Diagnosis not present

## 2018-03-28 DIAGNOSIS — D62 Acute posthemorrhagic anemia: Secondary | ICD-10-CM | POA: Diagnosis not present

## 2018-03-28 DIAGNOSIS — I739 Peripheral vascular disease, unspecified: Secondary | ICD-10-CM | POA: Diagnosis not present

## 2018-03-28 DIAGNOSIS — I2510 Atherosclerotic heart disease of native coronary artery without angina pectoris: Secondary | ICD-10-CM | POA: Diagnosis not present

## 2018-03-28 DIAGNOSIS — E8770 Fluid overload, unspecified: Secondary | ICD-10-CM | POA: Diagnosis not present

## 2018-03-28 DIAGNOSIS — E785 Hyperlipidemia, unspecified: Secondary | ICD-10-CM | POA: Diagnosis present

## 2018-03-28 DIAGNOSIS — E1151 Type 2 diabetes mellitus with diabetic peripheral angiopathy without gangrene: Secondary | ICD-10-CM | POA: Diagnosis present

## 2018-03-28 DIAGNOSIS — R0600 Dyspnea, unspecified: Secondary | ICD-10-CM | POA: Diagnosis not present

## 2018-03-28 DIAGNOSIS — I200 Unstable angina: Secondary | ICD-10-CM | POA: Diagnosis not present

## 2018-03-28 DIAGNOSIS — F341 Dysthymic disorder: Secondary | ICD-10-CM | POA: Diagnosis not present

## 2018-03-28 DIAGNOSIS — Z8546 Personal history of malignant neoplasm of prostate: Secondary | ICD-10-CM | POA: Diagnosis not present

## 2018-03-28 DIAGNOSIS — I10 Essential (primary) hypertension: Secondary | ICD-10-CM | POA: Diagnosis not present

## 2018-03-28 DIAGNOSIS — F329 Major depressive disorder, single episode, unspecified: Secondary | ICD-10-CM | POA: Diagnosis present

## 2018-03-28 DIAGNOSIS — E1122 Type 2 diabetes mellitus with diabetic chronic kidney disease: Secondary | ICD-10-CM | POA: Diagnosis present

## 2018-03-28 DIAGNOSIS — I25110 Atherosclerotic heart disease of native coronary artery with unstable angina pectoris: Secondary | ICD-10-CM | POA: Diagnosis present

## 2018-03-28 DIAGNOSIS — R079 Chest pain, unspecified: Secondary | ICD-10-CM | POA: Diagnosis present

## 2018-03-28 DIAGNOSIS — E7800 Pure hypercholesterolemia, unspecified: Secondary | ICD-10-CM | POA: Diagnosis present

## 2018-03-28 DIAGNOSIS — D6959 Other secondary thrombocytopenia: Secondary | ICD-10-CM | POA: Diagnosis present

## 2018-03-28 DIAGNOSIS — Z8249 Family history of ischemic heart disease and other diseases of the circulatory system: Secondary | ICD-10-CM | POA: Diagnosis not present

## 2018-03-28 DIAGNOSIS — Z888 Allergy status to other drugs, medicaments and biological substances status: Secondary | ICD-10-CM | POA: Diagnosis not present

## 2018-03-28 LAB — URINALYSIS, ROUTINE W REFLEX MICROSCOPIC
Bilirubin Urine: NEGATIVE
Glucose, UA: NEGATIVE mg/dL
Hgb urine dipstick: NEGATIVE
Ketones, ur: NEGATIVE mg/dL
Leukocytes, UA: NEGATIVE
Nitrite: NEGATIVE
Protein, ur: NEGATIVE mg/dL
Specific Gravity, Urine: 1.014 (ref 1.005–1.030)
pH: 5 (ref 5.0–8.0)

## 2018-03-28 LAB — HEPARIN LEVEL (UNFRACTIONATED)
Heparin Unfractionated: 0.33 IU/mL (ref 0.30–0.70)
Heparin Unfractionated: 0.3 IU/mL (ref 0.30–0.70)

## 2018-03-28 LAB — BASIC METABOLIC PANEL
Anion gap: 6 (ref 5–15)
BUN: 20 mg/dL (ref 8–23)
CO2: 25 mmol/L (ref 22–32)
Calcium: 8.7 mg/dL — ABNORMAL LOW (ref 8.9–10.3)
Chloride: 110 mmol/L (ref 98–111)
Creatinine, Ser: 1.25 mg/dL — ABNORMAL HIGH (ref 0.61–1.24)
GFR calc Af Amer: >60 mL/min (ref >60)
GFR calc non Af Amer: 57 mL/min — ABNORMAL LOW (ref >60)
Glucose, Bld: 155 mg/dL — ABNORMAL HIGH (ref 70–99)
Potassium: 4.1 mmol/L (ref 3.5–5.1)
Sodium: 141 mmol/L (ref 135–145)

## 2018-03-28 LAB — CBC
HCT: 34.7 % — ABNORMAL LOW (ref 39.0–52.0)
Hemoglobin: 11 g/dL — ABNORMAL LOW (ref 13.0–17.0)
MCH: 30.6 pg (ref 26.0–34.0)
MCHC: 31.7 g/dL (ref 30.0–36.0)
MCV: 96.7 fL (ref 80.0–100.0)
PLATELETS: 170 10*3/uL (ref 150–400)
RBC: 3.59 MIL/uL — ABNORMAL LOW (ref 4.22–5.81)
RDW: 12.2 % (ref 11.5–15.5)
WBC: 8.6 10*3/uL (ref 4.0–10.5)
nRBC: 0 % (ref 0.0–0.2)

## 2018-03-28 LAB — PULMONARY FUNCTION TEST
FEF 25-75 Pre: 1.41 L/sec
FEF2575-%Pred-Pre: 56 %
FEV1-%Pred-Pre: 59 %
FEV1-Pre: 1.98 L
FEV1FVC-%Pred-Pre: 101 %
FEV6-%Pred-Pre: 60 %
FEV6-Pre: 2.6 L
FEV6FVC-%Pred-Pre: 103 %
FVC-%Pred-Pre: 58 %
FVC-Pre: 2.66 L
Pre FEV1/FVC ratio: 75 %
Pre FEV6/FVC Ratio: 98 %

## 2018-03-28 LAB — LIPID PANEL
CHOL/HDL RATIO: 4.3 ratio
Cholesterol: 154 mg/dL (ref 0–200)
HDL: 36 mg/dL — ABNORMAL LOW (ref >40)
LDL Cholesterol: 105 mg/dL — ABNORMAL HIGH (ref 0–99)
Triglycerides: 67 mg/dL (ref <150)
VLDL: 13 mg/dL (ref 0–40)

## 2018-03-28 LAB — PROTIME-INR
INR: 1.13
Prothrombin Time: 14.4 seconds (ref 11.4–15.2)

## 2018-03-28 LAB — TSH: TSH: 1.707 u[IU]/mL (ref 0.350–4.500)

## 2018-03-28 LAB — GLUCOSE, CAPILLARY
Glucose-Capillary: 131 mg/dL — ABNORMAL HIGH (ref 70–99)
Glucose-Capillary: 119 mg/dL — ABNORMAL HIGH (ref 70–99)
Glucose-Capillary: 91 mg/dL (ref 70–99)
Glucose-Capillary: 132 mg/dL — ABNORMAL HIGH (ref 70–99)

## 2018-03-28 NOTE — Progress Notes (Signed)
1330-1405 Gave pt IS and he was able to demonstrate 1750 ml on IS with cues. Discussed with pt the importance of IS and mobility after surgery. Gave OHS booklet, care guide, and in the tube handout. Offered to walk with pt but he declined at this time. Did observe pt walking to bathroom independently. Wrote down how to view pre op video. Wife will be available 24/7 after discharge. Graylon Good RN BSN 03/28/2018 2:03 PM

## 2018-03-28 NOTE — Progress Notes (Addendum)
Progress Note  Patient Name: [name] Date of Encounter: 03/28/2018  Primary Cardiologist: Peter Martinique, MD   Subjective   No chest pain and no SOB.  Resting comfortably.  Inpatient Medications    Scheduled Meds: . aspirin  81 mg Oral Daily  . atorvastatin  80 mg Oral q1800  . docusate sodium  100 mg Oral Daily  . insulin aspart  0-15 Units Subcutaneous TID WC  . mesalamine  0.375 g Oral BID  . metoprolol tartrate  12.5 mg Oral BID  . pantoprazole  40 mg Oral Daily  . PARoxetine  20 mg Oral Daily  . sodium chloride flush  3 mL Intravenous Q12H   Continuous Infusions: . sodium chloride 80 mL/hr at 03/27/18 1838  . sodium chloride    . heparin 1,000 Units/hr (03/28/18 0459)   PRN Meds: sodium chloride, acetaminophen, albuterol, hydrOXYzine, ondansetron (ZOFRAN) IV, ondansetron **OR** [DISCONTINUED] ondansetron (ZOFRAN) IV, sodium chloride flush   Vital Signs    Vitals:   03/27/18 1300 03/27/18 1959 03/27/18 2120 03/28/18 0458  BP: (!) 147/55 (!) 129/43 (!) 128/44 124/75  Pulse: (!) 53 65  (!) 54  Resp: 13     Temp: 97.6 F (36.4 C) 97.7 F (36.5 C)  98.4 F (36.9 C)  TempSrc: Axillary Oral  Oral  SpO2: 98% 97%  96%  Weight:    75.4 kg  Height:        Intake/Output Summary (Last 24 hours) at 03/28/2018 0749 Last data filed at 03/28/2018 0459 Gross per 24 hour  Intake 1749.22 ml  Output -  Net 1749.22 ml   Filed Weights   03/26/18 1558 03/28/18 0458  Weight: 76 kg 75.4 kg    Telemetry    SR to SB at 50 with PACs - Personally Reviewed  ECG    No new - Personally Reviewed  Physical Exam   GEN: No acute distress.   Neck: No JVD Cardiac: RRR, no murmurs, rubs, or gallops. Rt wrist without hematoma, stable. Respiratory: Clear to auscultation bilaterally. GI: Soft, nontender, non-distended  MS: No edema; No deformity. Neuro:  Nonfocal  Psych: Normal affect   Labs    Chemistry Recent Labs  Lab 03/26/18 0216 03/27/18 0500  03/28/18 0240  NA 138 140 141  K 4.0 4.2 4.1  CL 107 108 110  CO2 22 25 25   GLUCOSE 221* 168* 155*  BUN 27* 23 20  CREATININE 1.51* 1.25* 1.25*  CALCIUM 8.9 8.9 8.7*  GFRNONAA 45* 57* 57*  GFRAA 53* >60 >60  ANIONGAP 9 7 6      Hematology Recent Labs  Lab 03/26/18 0216 03/28/18 0240  WBC 9.2 8.6  RBC 3.85* 3.59*  HGB 12.2* 11.0*  HCT 37.7* 34.7*  MCV 97.9 96.7  MCH 31.7 30.6  MCHC 32.4 31.7  RDW 12.4 12.2  PLT 170 170    Cardiac Enzymes Recent Labs  Lab 03/26/18 1016 03/26/18 1539  TROPONINI 0.04* 0.03*    Recent Labs  Lab 03/26/18 0225 03/26/18 0425  TROPIPOC 0.03 0.02     BNPNo results for input(s): BNP, PROBNP in the last 168 hours.   DDimer No results for input(s): DDIMER in the last 168 hours.   Radiology    Vas US Doppler Pre Cabg  Result Date: 03/27/2018 PREOPERATIVE VASCULAR EVALUATION  Indications:      Pre CABG evaulation. Comparison Study: Carotid exam on 09/03/2016 Performing Technologist: COLE, Hongying RDMS, RVT  Examination Guidelines: A complete evaluation includes B-mode imaging, spectral  Doppler, color Doppler, and power Doppler as needed of all accessible portions of each vessel. Bilateral testing is considered an integral part of a complete examination. Limited examinations for reoccurring indications may be performed as noted.  Right Carotid Findings: +----------+--------+--------+--------+-------------------+--------------------+           PSV cm/sEDV cm/sStenosisDescribe           Comments             +----------+--------+--------+--------+-------------------+--------------------+ CCA Prox  80      19                                                      +----------+--------+--------+--------+-------------------+--------------------+ CCA Distal102     21                                 focal thinckening of                                                      intimal vs plaque.    +----------+--------+--------+--------+-------------------+--------------------+ ICA Prox  126     31      1-39%   focal, calcific and                                                       hyperechoic                             +----------+--------+--------+--------+-------------------+--------------------+ ICA Distal137     44                                                      +----------+--------+--------+--------+-------------------+--------------------+ ECA       104     16                                                      +----------+--------+--------+--------+-------------------+--------------------+ Portions of this table do not appear on this page. +----------+--------+-------+--------+------------+           PSV cm/sEDV cmsDescribeArm Pressure +----------+--------+-------+--------+------------+ Subclavian150                    156          +----------+--------+-------+--------+------------+ +---------+--------+---+--------+--+---------+ VertebralPSV cm/s100EDV cm/s16Antegrade +---------+--------+---+--------+--+---------+ Left Carotid Findings: +----------+--------+--------+--------+-------------------------------+--------+           PSV cm/sEDV cm/sStenosisDescribe                       Comments +----------+--------+--------+--------+-------------------------------+--------+ CCA Prox  109     24  tortuous +----------+--------+--------+--------+-------------------------------+--------+ CCA Distal123     21                                                      +----------+--------+--------+--------+-------------------------------+--------+ ICA Prox  119     23      1-39%   focal, hyperechoic and calcific         +----------+--------+--------+--------+-------------------------------+--------+ ICA Distal111     29                                                       +----------+--------+--------+--------+-------------------------------+--------+ ECA       141     20                                                      +----------+--------+--------+--------+-------------------------------+--------+ +----------+--------+--------+--------+------------+ SubclavianPSV cm/sEDV cm/sDescribeArm Pressure +----------+--------+--------+--------+------------+           172                     157          +----------+--------+--------+--------+------------+ +---------+--------+--+--------+----------------------------+ VertebralPSV cm/s44EDV cm/sAntegrade and High resistant +---------+--------+--+--------+----------------------------+  ABI Findings: +---------+------------------+-----+-------------------+--------+ Right    Rt Pressure (mmHg)IndexWaveform           Comment  +---------+------------------+-----+-------------------+--------+ Brachial 156                    triphasic                   +---------+------------------+-----+-------------------+--------+ PTA      73                0.46 dampened monophasic         +---------+------------------+-----+-------------------+--------+ DP       97                0.62 dampened monophasic         +---------+------------------+-----+-------------------+--------+ Great Toe72                0.46                             +---------+------------------+-----+-------------------+--------+ +---------+------------------+-----+-------------------+-------+ Left     Lt Pressure (mmHg)IndexWaveform           Comment +---------+------------------+-----+-------------------+-------+ Brachial 157                    triphasic                  +---------+------------------+-----+-------------------+-------+ PTA      68                0.43 dampened monophasic        +---------+------------------+-----+-------------------+-------+ DP       75                     dampened monophasic         +---------+------------------+-----+-------------------+-------+ Alison Murray  0.34                            +---------+------------------+-----+-------------------+-------+ +-------+---------------+----------------+ ABI/TBIToday's ABI/TBIPrevious ABI/TBI +-------+---------------+----------------+ Right  0.62/0.46                       +-------+---------------+----------------+ Left   0.48/0.34                       +-------+---------------+----------------+  Right Doppler Findings: +--------+--------+-----+---------+----------------+ Site    PressureIndexDoppler  Comments         +--------+--------+-----+---------+----------------+ [id number]          triphasic                 +--------+--------+-----+---------+----------------+ Radial               triphasichigh bifurcation +--------+--------+-----+---------+----------------+ Ulnar                biphasic                  +--------+--------+-----+---------+----------------+  Left Doppler Findings: +--------+--------+-----+---------+--------+ Site    PressureIndexDoppler  Comments +--------+--------+-----+---------+--------+ [id number]          triphasic         +--------+--------+-----+---------+--------+ Radial               biphasic          +--------+--------+-----+---------+--------+ Ulnar                triphasic         +--------+--------+-----+---------+--------+  Summary: Right Carotid: Velocities in the right ICA are consistent with a 1-39% stenosis. Left Carotid: Velocities in the left ICA are consistent with a 1-39% stenosis. Vertebrals: Bilateral vertebral arteries demonstrate antegrade flow. Left             vertebral artery demonstrates high resistant flow. Right ABI: Resting right ankle-brachial index indicates moderate right lower extremity arterial disease. The right toe-brachial index is abnormal. Left ABI: Resting left ankle-brachial index indicates severe  left lower extremity arterial disease. The left toe-brachial index is abnormal. Right Upper Extremity: Doppler waveform obliterate with right radial compression. Doppler waveforms remain within normal limits with right ulnar compression. Left Upper Extremity: Doppler waveforms remain within normal limits with left radial compression. Doppler waveforms decrease <50% with left ulnar compression.    Preliminary     Cardiac Studies   Cardiac cath 03/28/18  Prox RCA lesion is 80% stenosed.  Mid RCA-1 lesion is 80% stenosed.  Mid RCA-2 lesion is 90% stenosed.  Dist RCA lesion is 99% stenosed.  Prox Cx lesion is 80% stenosed.  Ost 2nd Mrg lesion is 70% stenosed.  Ost LAD to Prox LAD lesion is 95% stenosed.  Prox LAD lesion is 80% stenosed.  Ost 1st Diag lesion is 90% stenosed.  Dist LAD lesion is 80% stenosed.   Severe three-vessel coronary obstructive disease with 85% in-stent restenosis in the proximal LAD stent followed by 80% LAD stenosis, 90% first diagonal stenosis and 80% apical LAD stenosis; 80% proximal left circumflex stenosis with 70% circumflex marginal stenosis; and calcified RCA with diffuse disease with stenoses of 80% proximal mid 90% before the acute margin and 99% after the acute margin with extensive collateralization from the very proximal conus proximal branches supplying the distal RCA.  LVEDP 16 mmHg.  RECOMMENDATION: Surgical consultation for CABG revascularization.  High potency statin therapy get LDL less than 70.  The patient will  be started on heparin 8 hours post sheath discontinuance.  Continue aspirin.  Will avoid P2 Y 12 inhibition due to need for CABG revascularization.   ECHO 03/26/18 Study Conclusions  - Left ventricle: The cavity size was normal. Wall thickness was   normal. Systolic function was normal. The estimated ejection   fraction was in the range of 60% to 65%. There is akinesis of the   mid-apicalanteroseptal myocardium. Doppler  parameters are   consistent with abnormal left ventricular relaxation (grade 1   diastolic dysfunction). - Aortic valve: Trileaflet; mildly calcified leaflets. There was   trivial regurgitation. - Mitral valve: Mildly calcified annulus. There was trivial   regurgitation. - Left atrium: The atrium was at the upper limits of normal in   size. - Right atrium: Central venous pressure (est): 3 mm Hg. - Atrial septum: No defect or patent foramen ovale was identified. - Tricuspid valve: There was physiologic regurgitation. - Pulmonary arteries: Systolic pressure could not be accurately   estimated. - Pericardium, extracardiac: There was no pericardial effusion.   Patient Profile     72 y.o. male w/ PMH of CAD (s/pstentingof LAD in 1996 and angioplasty alone to LAD in1999), PAD (known bilateral SFA occlusions by angiography in 07/2016), HTN, HLD, Type 2 DM, depression andintermittent tobacco usewho presented [facility] for evaluation of chest pain. transferred to [facility] for Cath.    Assessment & Plan    Chest pain with known CAD (hx stent to LAD 1996 and PTCA 1999) --flat troponins but echo with akinesis of the mid-apicalanteroseptal myocardium  EF normal. --cath with severe 3 vessel disease with 85% in stent restenosis in prox LAD followed by 80% LAD stenosis, 90% first diagonal stenosis and 80% apical LAD stenosis; 80% proximal left circumflex stenosis with 70% circumflex marginal stenosis; and calcified RCA with diffuse disease with stenoses of 80% proximal mid 90% before the acute margin and 99% after the acute margin with extensive collateralization from the very proximal conus proximal branches supplying the distal RCA. -- TCTS consult  --IV heparin, no P2Y12- was not on prior to admit  --on ASA 81 mg daily.  PAD  --hx bil SFA occlusions by cath 2018 with medical management and abnormal ABIs --carotid dopplers with 1-39% stenosis bil.   HTN  --on HCTZ 12.5  and losartan 25 mg daily held for cath  And Cr 1.25 today BP is controlled ? Hold for another day ? and lopressor 12.5 BID unable to increase due to SB in 50s    HLD on simvastatin up to 80 but pt self reduced due to myalgias.  Currently on lipitor 80 if myalgias develop may need Repatha  DM-2  --metformin on hold glucose 217 to 131 on SSI --HgbA1c of 6.9 this admit  CKD-3  --cr 1.25 today improved from 1.51 on admit  Intermittent tobacco use,  --pt stopped smoking 4 weeks ago.   For questions or updates, please contact [place] Please consult [email or web address] for contact info under      Signed, Cecilie Kicks, NP  03/28/2018, 7:49 AM   ---------------------------------------------------------------------------------------------   History and all data above reviewed.  Patient examined.  I agree with the findings as above.  GRADYN SHEIN is doing well and has no acute concerns.  Constitutional: No acute distress Eyes: pupils equally round and reactive to light, sclera non-icteric, normal conjunctiva and lids ENMT: normal dentition, moist mucous membranes Cardiovascular: regular rhythm, normal rate, no murmurs. S1 and S2 normal.  Radial pulses normal bilaterally. No jugular venous distention.  Respiratory: clear to auscultation bilaterally GI : normal bowel sounds, soft and nontender. No distention.   MSK: extremities warm, well perfused. No edema.  NEURO: grossly nonfocal exam, moves all extremities. PSYCH: alert and oriented x 3, normal mood and affect.   All available labs, radiology testing, previous records reviewed. Agree with documented assessment and plan of my colleague as stated above with the following additions or changes:  Principal Problem:   Chest pain Active Problems:   Type 2 diabetes mellitus (HCC)   Hypercholesterolemia   ANXIETY DEPRESSION   Essential hypertension   CAD s/p PCI LAD    PAD (peripheral artery disease) (HCC)   Crohn's disease  ([place])   Tobacco use   Grade II diastolic dysfunction   Plan: He has CABG scheduled on Dec 10, he feels well and has no issues. We will monitor over the weekend.    Elouise Munroe, MD [facility] 4:47 PM  03/28/2018

## 2018-03-28 NOTE — Progress Notes (Signed)
[place] for Heparin Indication: chest pain/ACS, awaiting CVTS consult  Allergies  Allergen Reactions  . Flomax [Tamsulosin Hcl]     High dose   . Morphine And Related     Patient Measurements: Height: 5\' 11"  (180.3 cm) Weight: 167 lb 8.8 oz (76 kg) IBW/kg (Calculated) : 75.3 Heparin Dosing Weight: 75kg  Vital Signs: Temp: 97.7 F (36.5 C) (12/05 1959) Temp Source: Oral (12/05 1959) BP: 128/44 (12/05 2120) Pulse Rate: 65 (12/05 1959)  Labs: Recent Labs    03/26/18 0216 03/26/18 1016 03/26/18 1539 03/27/18 0500 03/28/18 0240  HGB 12.2*  --   --   --  11.0*  HCT 37.7*  --   --   --  34.7*  PLT 170  --   --   --  170  LABPROT  --   --   --   --  14.4  INR  --   --   --   --  1.13  HEPARINUNFRC  --   --   --   --  0.33  CREATININE 1.51*  --   --  1.25* 1.25*  TROPONINI  --  0.04* 0.03*  --   --     Estimated Creatinine Clearance: 56.9 mL/min (A) (by C-G formula based on SCr of 1.25 mg/dL (H)).   Medical History: Past Medical History:  Diagnosis Date  . Adenocarcinoma of prostate ([place])   . Anxiety   . CAD (coronary artery disease)    a. s/p prior stenting of LAD in 1996 and angioplasty alone in 1999 to LAD by review of prior notes.   . 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 Crohn's disease ([employer])   . CTS (carpal tunnel syndrome)    Mild  . Depression   . Diabetes mellitus without complication (HCC)    Type 2  . Hyperlipidemia   . Hypertension   . Kidney stone   . Optic neuritis     Assessment: 18 yom s/p cath this morning found to have multivessel CAD, will consult surgical team and start heparin this afternoon. CBC within normal limits prior to cath, not on anticoagulation prior to admit.   12/6 AM update: initial heparin level this AM therapeutic, CVTS consult pending  Goal of Therapy:  Heparin level 0.3-0.7 units/ml Monitor platelets by anticoagulation protocol: Yes   Plan:  Cont heparin 1000 units/hr Confirmatory  heparin level at [place], PharmD, [place] Pharmacist Phone: [phone]

## 2018-03-28 NOTE — Consult Note (Signed)
[address]       [place] [address]             [phone]        Asad P Pavlovic [place] Medical Record #[id number] Date of Birth: Feb 17, 1946  Referring: No ref. provider found Primary Care: [name], MD Primary Cardiologist: [place], MD  Chief Complaint:   Chest pain Chief Complaint  Patient presents with  . Chest Pain    History of Present Illness:     Patient examined, images of coronary angiogram and 2D echocardiogram personally reviewed and counseled with the patient.  72 year old retired [employer] male reformed smoker recently admitted with symptoms of unstable angina through the emergency department at [facility].  The patient also had symptoms of dyspnea with minimal activities but no edema PND or orthopnea.  Troponin levels were initially negative and chest x-ray was unremarkable.  Past medical history is positive for known peripheral vascular disease with bilateral SFA occlusion and mild claudication.  Also history of adenocarcinoma prostate treated with radioactive seeds.  He has non-insulin-dependent diabetes. Cardiac catheterization sedation was performed yesterday.  This demonstrates severe three-vessel CAD with occlusion of the RCA, 90% LAD stenosis, and 80% stenosis of the OM1 and distal circumflex.  LVEDP is normal.  Echocardiogram shows preserved LV systolic function without significant valvular disease.  Patient is currently stable on IV heparin.  The patient's CABG Dopplers showed no significant carotid disease but with bilateral ABIs reduced at 0.5.  Current Activity/ Functional Status: Patient lives with his wife, sedentary lifestyle. Zubrod Score: At the time of surgery this patient's most appropriate activity status/level should be described as: []     0    Normal activity, no symptoms []     1    Restricted in physical strenuous activity but ambulatory, able to do out light work [x]     2    Ambulatory and  capable of self care, unable to do work activities, up and about                 more than 50%  Of the time                            []     3    Only limited self care, in bed greater than 50% of waking hours []     4    Completely disabled, no self care, confined to bed or chair []     5    Moribund  Past Medical History:  Diagnosis Date  . Adenocarcinoma of prostate ([place])   . Anxiety   . CAD (coronary artery disease)    a. s/p prior stenting of LAD in 1996 and angioplasty alone in 1999 to LAD by review of prior notes.   . 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 Crohn's disease ([place])   . CTS (carpal tunnel syndrome)    Mild  . Depression   . Diabetes mellitus without complication (HCC)    Type 2  . Hyperlipidemia   . Hypertension   . Kidney stone   . Optic neuritis     Past Surgical History:  Procedure Laterality Date  . COLONOSCOPY    . KNEE CARTILAGE SURGERY Right   . LEFT HEART CATH AND CORONARY ANGIOGRAPHY N/A 03/27/2018   Procedure: LEFT HEART CATH AND CORONARY ANGIOGRAPHY;  Surgeon: [name], MD;  Location: [facility];  Service: Cardiovascular;  Laterality: N/A;  . LOWER EXTREMITY ANGIOGRAPHY N/A 08/13/2016   Procedure: Lower Extremity Angiography;  Surgeon: [name], MD;  Location: [facility];  Service: Cardiovascular;  Laterality: N/A;  . NASAL SINUS SURGERY    . PROSTATE SURGERY      Social History   Tobacco Use  Smoking Status Current Every Day Smoker  Smokeless Tobacco Never Used    Social History   Substance and Sexual Activity  Alcohol Use No     Allergies  Allergen Reactions  . Flomax [Tamsulosin Hcl]     High dose   . Morphine And Related     Current Facility-Administered Medications  Medication Dose Route Frequency Provider Last Rate Last Dose  . 0.9 %  sodium chloride infusion   Intravenous Continuous [name], MD 80 mL/hr at 03/27/18 1838    . 0.9 %  sodium chloride infusion  250 mL Intravenous PRN [name],  MD      . acetaminophen (TYLENOL) tablet 650 mg  650 mg Oral Q4H PRN [name], MD      . albuterol (PROVENTIL) (2.5 MG/3ML) 0.083% nebulizer solution 3 mL  3 mL Inhalation Q6H PRN Ahmed Prima, [place] M, PA-C      . aspirin chewable tablet 81 mg  81 mg Oral Daily [name], MD   81 mg at 03/28/18 0931  . atorvastatin (LIPITOR) tablet 80 mg  80 mg Oral q1800 [name], MD   80 mg at 03/27/18 1738  . docusate sodium (COLACE) capsule 100 mg  100 mg Oral Daily Bernerd Pho M, PA-C   100 mg at 03/28/18 0931  . heparin ADULT infusion 100 units/mL (25000 units/271m sodium chloride 0.45%)  1,000 Units/hr Intravenous Continuous WLyndee Leo RPH 10 mL/hr at 03/28/18 0459 1,000 Units/hr at 03/28/18 0459  . hydrOXYzine (ATARAX/VISTARIL) tablet 25 mg  25 mg Oral Q6H PRN SErma Heritage PA-C   25 mg at 03/27/18 1826  . insulin aspart (novoLOG) injection 0-15 Units  0-15 Units Subcutaneous TID WC SErma Heritage PA-C   2 Units at 03/28/18 05852 . mesalamine (APRISO) 24 hr capsule 0.375 g  0.375 g Oral BID Strader, BTanz[place]M, PA-C      . metoprolol tartrate (LOPRESSOR) tablet 12.5 mg  12.5 mg Oral BID SBernerd PhoM, PA-C   12.5 mg at 03/28/18 0931  . ondansetron (ZOFRAN) injection 4 mg  4 mg Intravenous Q6H PRN KTroy Sine MD      . ondansetron ([facility] tablet 4 mg  4 mg Oral Q6H PRN SAhmed Prima BTanzaniaM, PA-C      . pantoprazole (PROTONIX) EC tablet 40 mg  40 mg Oral Daily SBernerd PhoM, PA-C   40 mg at 03/28/18 0931  . PARoxetine (PAXIL) tablet 20 mg  20 mg Oral Daily SBernerd PhoM, PA-C   20 mg at 03/28/18 0931  . sodium chloride flush (NS) 0.9 % injection 3 mL  3 mL Intravenous Q12H KShelva MajesticA, MD      . sodium chloride flush (NS) 0.9 % injection 3 mL  3 mL Intravenous PRN KTroy Sine MD        Medications Prior to Admission  Medication Sig Dispense Refill Last Dose  . albuterol (PROVENTIL HFA;VENTOLIN HFA) 108 (90 Base) MCG/ACT inhaler  Inhale 2 puffs into the lungs every 6 (six) hours as needed for wheezing. 1 Inhaler 0 03/25/2018 at Unknown time  .  ascorbic acid (VITAMIN C) 500 MG tablet Take 500 mg by mouth 2 (two) times daily.   03/25/2018 at Unknown time  . aspirin 325 MG EC tablet Take 325 mg by mouth daily.   03/25/2018 at Unknown time  . clotrimazole (LOTRIMIN) 1 % cream Apply 1 application topically 2 (two) times daily as needed.    Past Month at Unknown time  . docusate sodium (COLACE) 100 MG capsule Take 100 mg by mouth 2 (two) times daily.   03/25/2018 at Unknown time  . flunisolide (NASAREL) 29 MCG/ACT (0.025%) nasal spray Place 1 spray into the nose daily as needed for allergies.    03/25/2018 at Unknown time  . glipiZIDE (GLUCOTROL) 5 MG tablet Take 1 tablet (5 mg total) by mouth daily before breakfast. 30 tablet 5 03/25/2018 at Unknown time  . hydrochlorothiazide (HYDRODIURIL) 25 MG tablet Take 12.5 mg by mouth daily.    03/25/2018 at Unknown time  . mesalamine (APRISO) 0.375 g 24 hr capsule Take 0.75 mg by mouth 2 (two) times daily.   03/25/2018 at Unknown time  . metFORMIN (GLUCOPHAGE) 500 MG tablet Take 1 tablet (500 mg total) by mouth 2 (two) times daily with a meal. 180 tablet 1 03/25/2018 at Unknown time  . metoprolol tartrate (LOPRESSOR) 25 MG tablet Take 12.5 mg by mouth 2 (two) times daily.    03/25/2018 at 1800 time  . nitroGLYCERIN (NITROSTAT) 0.4 MG SL tablet Place 1 tablet (0.4 mg total) under the tongue every 5 (five) minutes as needed. 25 tablet 12 unknown  . OLANZapine (ZYPREXA) 5 MG tablet Take by mouth. Takes one half every day   03/25/2018 at Unknown time  . omeprazole (PRILOSEC) 20 MG capsule Take 20 mg by mouth daily.   03/25/2018 at Unknown time  . PARoxetine (PAXIL) 40 MG tablet Take 60 mg by mouth at bedtime. Take 1 1/2 tablets at bedtime    03/25/2018 at Unknown time  . simvastatin (ZOCOR) 80 MG tablet Take 1 tablet (80 mg total) by mouth at bedtime. 30 tablet 5 03/25/2018 at Unknown time  . traZODone  (DESYREL) 50 MG tablet Take 50 mg by mouth at bedtime.    03/25/2018 at Unknown time  . triamcinolone cream (KENALOG) 0.1 % Apply 1 application topically 2 (two) times daily. (Patient taking differently: Apply 1 application topically 2 (two) times daily as needed. ) 60 g 2 Past Week at Unknown time  . losartan (COZAAR) 25 MG tablet Take 1 tablet (25 mg total) by mouth daily. 90 tablet 3 Taking    Family History  Problem Relation Age of Onset  . Hypertension Mother   . Colon cancer Mother   . Heart attack Father   . Coronary artery disease Father   . Hypertension Sister   . Crohn's disease Brother      Review of Systems:   ROS The patient is right-hand dominant Patient denies previous thoracic trauma, rib fracture, pneumothorax Patient denies bleeding diathesis with his previous prostate surgery.    Cardiac Review of Systems: Y or  [    ]= no  Chest Pain [yes]  Resting SOB [   ] Exertional SOB  [yes]  Orthopnea [  ]   Pedal Edema [   ]    Palpitations [  ] Syncope  [  ]   Presyncope [   ]  General Review of Systems: [Y] = yes [  ]=no Constitional: recent weight change [  ]; anorexia [  ]; fatigue [  ];  nausea [  ]; night sweats [  ]; fever [  ]; or chills [  ]                                                               Dental: Last Dentist visit: 1 year no complaints  Eye : blurred vision [  ]; diplopia [   ]; vision changes [  ];  Amaurosis fugax[  ]; Resp: cough [  ];  wheezing[  ];  hemoptysis[  ]; shortness of breath[  ]; paroxysmal nocturnal dyspnea[  ]; dyspnea on exertion[yes]; or orthopnea[  ];  GI:  gallstones[  ], vomiting[  ];  dysphagia[  ]; melena[  ];  hematochezia [  ]; heartburn[  ];   Hx of  Colonoscopy[  ]; GU: kidney stones [  ]; hematuria[  ];   dysuria [  ];  nocturia[yes history of prostate cancer treated with radioactive seeds];  history of     obstruction [  ]; urinary frequency [  ]             Skin: rash, swelling[  ];, hair loss[  ];  peripheral edema[  ];   or itching[  ]; Musculosketetal: myalgias[  ];  joint swelling[  ];  joint erythema[  ];  joint pain[  ];  back pain[  ];  Heme/Lymph: bruising[  ];  bleeding[  ];  anemia[  ];  Neuro: TIA[  ];  headaches[  ];  stroke[  ];  vertigo[  ];  seizures[  ];   paresthesias[  ];  difficulty walking[  ];  Psych:depression[  ]; anxiety[  ];  Endocrine: diabetes[yes];  thyroid dysfunction[  ];           Physical Exam: BP (!) 153/69   Pulse 60   Temp 98.4 F (36.9 C) (Oral)   Resp 13   Ht 5' 11"  (1.803 m)   Wt 75.4 kg   SpO2 96%   BMI 23.18 kg/m         Exam    General- alert and comfortable    Neck- no JVD, no cervical adenopathy palpable, no carotid bruit   Lungs- clear without rales, wheezes   Cor- regular rate and rhythm, no murmur , gallop   Abdomen- soft, non-tender   Extremities - warm, non-tender, minimal edema, tibial pulses not palpable bilaterally   Neuro- oriented, appropriate, no focal weakness   Diagnostic Studies & Laboratory data:     Recent Radiology Findings:   Vas US Doppler Pre Cabg  Result Date: 03/27/2018 PREOPERATIVE VASCULAR EVALUATION  Indications:      Pre CABG evaulation. Comparison Study: Carotid exam on 09/03/2016 Performing Technologist: COLE, Hongying RDMS, RVT  Examination Guidelines: A complete evaluation includes B-mode imaging, spectral Doppler, color Doppler, and power Doppler as needed of all accessible portions of each vessel. Bilateral testing is considered an integral part of a complete examination. Limited examinations for reoccurring indications may be performed as noted.  Right Carotid Findings: +----------+--------+--------+--------+-------------------+--------------------+           PSV cm/sEDV cm/sStenosisDescribe           Comments             +----------+--------+--------+--------+-------------------+--------------------+ CCA Prox  80  19                                                       +----------+--------+--------+--------+-------------------+--------------------+ CCA Distal102     21                                 focal thinckening of                                                      intimal vs plaque.   +----------+--------+--------+--------+-------------------+--------------------+ ICA Prox  126     31      1-39%   focal, calcific and                                                       hyperechoic                             +----------+--------+--------+--------+-------------------+--------------------+ ICA Distal137     44                                                      +----------+--------+--------+--------+-------------------+--------------------+ ECA       104     16                                                      +----------+--------+--------+--------+-------------------+--------------------+ Portions of this table do not appear on this page. +----------+--------+-------+--------+------------+           PSV cm/sEDV cmsDescribeArm Pressure +----------+--------+-------+--------+------------+ Subclavian150                    156          +----------+--------+-------+--------+------------+ +---------+--------+---+--------+--+---------+ VertebralPSV cm/s100EDV cm/s16Antegrade +---------+--------+---+--------+--+---------+ Left Carotid Findings: +----------+--------+--------+--------+-------------------------------+--------+           PSV cm/sEDV cm/sStenosisDescribe                       Comments +----------+--------+--------+--------+-------------------------------+--------+ CCA Prox  109     24                                             tortuous +----------+--------+--------+--------+-------------------------------+--------+ CCA Distal123     21                                                      +----------+--------+--------+--------+-------------------------------+--------+  ICA Prox  119      23      1-39%   focal, hyperechoic and calcific         +----------+--------+--------+--------+-------------------------------+--------+ ICA Distal111     29                                                      +----------+--------+--------+--------+-------------------------------+--------+ ECA       141     20                                                      +----------+--------+--------+--------+-------------------------------+--------+ +----------+--------+--------+--------+------------+ SubclavianPSV cm/sEDV cm/sDescribeArm Pressure +----------+--------+--------+--------+------------+           172                     157          +----------+--------+--------+--------+------------+ +---------+--------+--+--------+----------------------------+ VertebralPSV cm/s44EDV cm/sAntegrade and High resistant +---------+--------+--+--------+----------------------------+  ABI Findings: +---------+------------------+-----+-------------------+--------+ Right    Rt Pressure (mmHg)IndexWaveform           Comment  +---------+------------------+-----+-------------------+--------+ Brachial 156                    triphasic                   +---------+------------------+-----+-------------------+--------+ PTA      73                0.46 dampened monophasic         +---------+------------------+-----+-------------------+--------+ DP       97                0.62 dampened monophasic         +---------+------------------+-----+-------------------+--------+ Great Toe72                0.46                             +---------+------------------+-----+-------------------+--------+ +---------+------------------+-----+-------------------+-------+ Left     Lt Pressure (mmHg)IndexWaveform           Comment +---------+------------------+-----+-------------------+-------+ Brachial 157                    triphasic                   +---------+------------------+-----+-------------------+-------+ PTA      68                0.43 dampened monophasic        +---------+------------------+-----+-------------------+-------+ DP       75                     dampened monophasic        +---------+------------------+-----+-------------------+-------+ Great Toe54                0.34                            +---------+------------------+-----+-------------------+-------+ +-------+---------------+----------------+ ABI/TBIToday's ABI/TBIPrevious ABI/TBI +-------+---------------+----------------+ Right  0.62/0.46                       +-------+---------------+----------------+  Left   0.48/0.34                       +-------+---------------+----------------+  Right Doppler Findings: +--------+--------+-----+---------+----------------+ Site    PressureIndexDoppler  Comments         +--------+--------+-----+---------+----------------+ [id number]          triphasic                 +--------+--------+-----+---------+----------------+ Radial               triphasichigh bifurcation +--------+--------+-----+---------+----------------+ Ulnar                biphasic                  +--------+--------+-----+---------+----------------+  Left Doppler Findings: +--------+--------+-----+---------+--------+ Site    PressureIndexDoppler  Comments +--------+--------+-----+---------+--------+ [id number]          triphasic         +--------+--------+-----+---------+--------+ Radial               biphasic          +--------+--------+-----+---------+--------+ Ulnar                triphasic         +--------+--------+-----+---------+--------+  Summary: Right Carotid: Velocities in the right ICA are consistent with a 1-39% stenosis. Left Carotid: Velocities in the left ICA are consistent with a 1-39% stenosis. Vertebrals: Bilateral vertebral arteries demonstrate antegrade flow. Left              vertebral artery demonstrates high resistant flow. Right ABI: Resting right ankle-brachial index indicates moderate right lower extremity arterial disease. The right toe-brachial index is abnormal. Left ABI: Resting left ankle-brachial index indicates severe left lower extremity arterial disease. The left toe-brachial index is abnormal. Right Upper Extremity: Doppler waveform obliterate with right radial compression. Doppler waveforms remain within normal limits with right ulnar compression. Left Upper Extremity: Doppler waveforms remain within normal limits with left radial compression. Doppler waveforms decrease <50% with left ulnar compression.    Preliminary      I have independently reviewed the above radiologic studies and discussed with the patient   Recent Lab Findings: Lab Results  Component Value Date   WBC 8.6 03/28/2018   HGB 11.0 (L) 03/28/2018   HCT 34.7 (L) 03/28/2018   PLT 170 03/28/2018   GLUCOSE 155 (H) 03/28/2018   CHOL 154 03/28/2018   TRIG 67 03/28/2018   HDL 36 (L) 03/28/2018   LDLCALC 105 (H) 03/28/2018   ALT 22 09/03/2017   AST 17 09/03/2017   NA 141 03/28/2018   K 4.1 03/28/2018   CL 110 03/28/2018   CREATININE 1.25 (H) 03/28/2018   BUN 20 03/28/2018   CO2 25 03/28/2018   TSH 1.707 03/28/2018   INR 1.13 03/28/2018   HGBA1C 6.9 (H) 03/26/2018      Assessment / Plan:   Unstable angina Preserved LV systolic function Non-insulin-dependent diabetes mellitus Peripheral vascular disease Adenocarcinoma the prostate, treated Hypertension Hyperlipidemia  Patient would benefit from multivessel CABG. He will need to remain in the hospital on IV heparin until surgery. First available date for his operation is December 10. I have discussed the recommendation for CABG with the patient for treatment of his severe CAD and he is in agreement to proceed with surgery next week.        @ME1 @ 03/28/2018 10:34 AM

## 2018-03-28 NOTE — Progress Notes (Signed)
[place] for Heparin Indication: severe 3v CAD - awaiting CABG  Allergies  Allergen Reactions  . Flomax [Tamsulosin Hcl]     High dose   . Morphine And Related     Patient Measurements: Height: 5\' 11"  (180.3 cm) Weight: 166 lb 3.2 oz (75.4 kg) IBW/kg (Calculated) : 75.3 Heparin Dosing Weight: 75kg  Vital Signs: Temp: 98.4 F (36.9 C) (12/06 0458) Temp Source: Oral (12/06 0458) BP: 153/69 (12/06 0931) Pulse Rate: 60 (12/06 0931)  Labs: Recent Labs    03/26/18 0216 03/26/18 1016 03/26/18 1539 03/27/18 0500 03/28/18 0240 03/28/18 1116  HGB 12.2*  --   --   --  11.0*  --   HCT 37.7*  --   --   --  34.7*  --   PLT 170  --   --   --  170  --   LABPROT  --   --   --   --  14.4  --   INR  --   --   --   --  1.13  --   HEPARINUNFRC  --   --   --   --  0.33 0.30  CREATININE 1.51*  --   --  1.25* 1.25*  --   TROPONINI  --  0.04* 0.03*  --   --   --     Estimated Creatinine Clearance: 56.9 mL/min (A) (by C-G formula based on SCr of 1.25 mg/dL (H)).  Assessment: 89 yom s/p cath 12/5 which showed severe 3v CAD. Plan for CABG 12/10. Pt continues on heparin.  Heparin level therapeutic (0.3) but at very bottom of therapeutic range on gtt at 1000 units/hr. Hgb down to 11, plt wnl. No bleeding noted.  Goal of Therapy:  Heparin level 0.3-0.7 units/ml Monitor platelets by anticoagulation protocol: Yes   Plan:  Increase heparin to 1050 units/hr to maintain in therapeutic range Will f/u daily heparin level and CBC  Sherlon Handing, PharmD, BCPS Clinical pharmacist 03/28/2018 12:26 PM  **Pharmacist phone directory can now be found on [email or web address] ([email or web address]).  Listed under Industry.

## 2018-03-29 DIAGNOSIS — E118 Type 2 diabetes mellitus with unspecified complications: Secondary | ICD-10-CM

## 2018-03-29 LAB — CBC
HEMATOCRIT: 34.3 % — AB (ref 39.0–52.0)
Hemoglobin: 11 g/dL — ABNORMAL LOW (ref 13.0–17.0)
MCH: 30.9 pg (ref 26.0–34.0)
MCHC: 32.1 g/dL (ref 30.0–36.0)
MCV: 96.3 fL (ref 80.0–100.0)
Platelets: 168 10*3/uL (ref 150–400)
RBC: 3.56 MIL/uL — ABNORMAL LOW (ref 4.22–5.81)
RDW: 12.1 % (ref 11.5–15.5)
WBC: 8.7 10*3/uL (ref 4.0–10.5)
nRBC: 0 % (ref 0.0–0.2)

## 2018-03-29 LAB — GLUCOSE, CAPILLARY
Glucose-Capillary: 116 mg/dL — ABNORMAL HIGH (ref 70–99)
Glucose-Capillary: 118 mg/dL — ABNORMAL HIGH (ref 70–99)
Glucose-Capillary: 99 mg/dL (ref 70–99)
Glucose-Capillary: 132 mg/dL — ABNORMAL HIGH (ref 70–99)

## 2018-03-29 LAB — HEPARIN LEVEL (UNFRACTIONATED): Heparin Unfractionated: 0.54 IU/mL (ref 0.30–0.70)

## 2018-03-29 NOTE — Progress Notes (Signed)
[place] for Heparin Indication: severe 3v CAD - awaiting CABG  Allergies  Allergen Reactions  . Flomax [Tamsulosin Hcl]     High dose   . Morphine And Related     Patient Measurements: Height: 5\' 11"  (180.3 cm) Weight: 164 lb 8 oz (74.6 kg) IBW/kg (Calculated) : 75.3 Heparin Dosing Weight: 75kg  Vital Signs: Temp: 98 F (36.7 C) (12/07 0444) Temp Source: Oral (12/07 0444) BP: 132/65 (12/07 0444) Pulse Rate: 61 (12/07 0444)  Labs: Recent Labs    03/26/18 1539 03/27/18 0500 03/28/18 0240 03/28/18 1116 03/29/18 0354  HGB  --   --  11.0*  --  11.0*  HCT  --   --  34.7*  --  34.3*  PLT  --   --  170  --  168  LABPROT  --   --  14.4  --   --   INR  --   --  1.13  --   --   HEPARINUNFRC  --   --  0.33 0.30 0.54  CREATININE  --  1.25* 1.25*  --   --   TROPONINI 0.03*  --   --   --   --     Estimated Creatinine Clearance: 56.4 mL/min (A) (by C-G formula based on SCr of 1.25 mg/dL (H)).  Assessment: 71 yom s/p cath 12/5, which showed severe 3v CAD. Plan for CABG 12/10. Pt continues on heparin.  Heparin level remains therapeutic. CBC stable. No bleeding documented.  Goal of Therapy:  Heparin level 0.3-0.7 units/ml Monitor platelets by anticoagulation protocol: Yes   Plan:  Continue heparin at 1050 units/hr Monitor daily heparin level and CBC, s/sx bleeding CABG planned for 12/10  Elicia Lamp, PharmD, BCPS Clinical Pharmacist Clinical phone [phone] Please check AMION for all [facility] contact numbers 03/29/2018 11:03 AM

## 2018-03-29 NOTE — Progress Notes (Signed)
Progress Note  Patient Name: [name] Date of Encounter: 03/29/2018  Primary Cardiologist: Peter Martinique, MD   Subjective   Feeling well and denies chest pain, palpitations, and shortness of breath.  Inpatient Medications    Scheduled Meds: . aspirin  81 mg Oral Daily  . atorvastatin  80 mg Oral q1800  . docusate sodium  100 mg Oral Daily  . insulin aspart  0-15 Units Subcutaneous TID WC  . mesalamine  0.375 g Oral BID  . metoprolol tartrate  12.5 mg Oral BID  . pantoprazole  40 mg Oral Daily  . PARoxetine  20 mg Oral Daily  . sodium chloride flush  3 mL Intravenous Q12H   Continuous Infusions: . sodium chloride 80 mL/hr at 03/29/18 0829  . sodium chloride    . heparin 1,050 Units/hr (03/29/18 0241)   PRN Meds: sodium chloride, acetaminophen, albuterol, hydrOXYzine, ondansetron (ZOFRAN) IV, ondansetron **OR** [DISCONTINUED] ondansetron (ZOFRAN) IV, sodium chloride flush   Vital Signs    Vitals:   03/28/18 0931 03/28/18 1318 03/28/18 1944 03/29/18 0444  BP: (!) 153/69 128/62 (!) 133/59 132/65  Pulse: 60 (!) 52 (!) 55 61  Resp:      Temp:  97.7 F (36.5 C) (!) 97.5 F (36.4 C) 98 F (36.7 C)  TempSrc:  Oral Oral Oral  SpO2:  98% 98% 98%  Weight:    74.6 kg  Height:        Intake/Output Summary (Last 24 hours) at 03/29/2018 1252 Last data filed at 03/29/2018 0241 Gross per 24 hour  Intake 1561.27 ml  Output -  Net 1561.27 ml   Filed Weights   03/26/18 1558 03/28/18 0458 03/29/18 0444  Weight: 76 kg 75.4 kg 74.6 kg    Telemetry    Sinus rhythm- Personally Reviewed  ECG    No new tracings- Personally Reviewed  Physical Exam   GEN: No acute distress.   Neck: No JVD Cardiac: RRR, no murmurs, rubs, or gallops.  Respiratory: Clear to auscultation bilaterally. GI: Soft, nontender, non-distended  MS: No edema; No deformity. Neuro:  Nonfocal  Psych: Normal affect   Labs    Chemistry Recent Labs  Lab 03/26/18 0216 03/27/18 0500  03/28/18 0240  NA 138 140 141  K 4.0 4.2 4.1  CL 107 108 110  CO2 22 25 25   GLUCOSE 221* 168* 155*  BUN 27* 23 20  CREATININE 1.51* 1.25* 1.25*  CALCIUM 8.9 8.9 8.7*  GFRNONAA 45* 57* 57*  GFRAA 53* >60 >60  ANIONGAP 9 7 6      Hematology Recent Labs  Lab 03/26/18 0216 03/28/18 0240 03/29/18 0354  WBC 9.2 8.6 8.7  RBC 3.85* 3.59* 3.56*  HGB 12.2* 11.0* 11.0*  HCT 37.7* 34.7* 34.3*  MCV 97.9 96.7 96.3  MCH 31.7 30.6 30.9  MCHC 32.4 31.7 32.1  RDW 12.4 12.2 12.1  PLT 170 170 168    Cardiac Enzymes Recent Labs  Lab 03/26/18 1016 03/26/18 1539  TROPONINI 0.04* 0.03*    Recent Labs  Lab 03/26/18 0225 03/26/18 0425  TROPIPOC 0.03 0.02     BNPNo results for input(s): BNP, PROBNP in the last 168 hours.   DDimer No results for input(s): DDIMER in the last 168 hours.   Radiology    Vas US Doppler Pre Cabg  Result Date: 03/27/2018 PREOPERATIVE VASCULAR EVALUATION  Indications:      Pre CABG evaulation. Comparison Study: Carotid exam on 09/03/2016 Performing Technologist: COLE, Hongying RDMS, RVT  Examination Guidelines:  A complete evaluation includes B-mode imaging, spectral Doppler, color Doppler, and power Doppler as needed of all accessible portions of each vessel. Bilateral testing is considered an integral part of a complete examination. Limited examinations for reoccurring indications may be performed as noted.  Right Carotid Findings: +----------+--------+--------+--------+-------------------+--------------------+           PSV cm/sEDV cm/sStenosisDescribe           Comments             +----------+--------+--------+--------+-------------------+--------------------+ CCA Prox  80      19                                                      +----------+--------+--------+--------+-------------------+--------------------+ CCA Distal102     21                                 focal thinckening of                                                       intimal vs plaque.   +----------+--------+--------+--------+-------------------+--------------------+ ICA Prox  126     31      1-39%   focal, calcific and                                                       hyperechoic                             +----------+--------+--------+--------+-------------------+--------------------+ ICA Distal137     44                                                      +----------+--------+--------+--------+-------------------+--------------------+ ECA       104     16                                                      +----------+--------+--------+--------+-------------------+--------------------+ Portions of this table do not appear on this page. +----------+--------+-------+--------+------------+           PSV cm/sEDV cmsDescribeArm Pressure +----------+--------+-------+--------+------------+ Subclavian150                    156          +----------+--------+-------+--------+------------+ +---------+--------+---+--------+--+---------+ VertebralPSV cm/s100EDV cm/s16Antegrade +---------+--------+---+--------+--+---------+ Left Carotid Findings: +----------+--------+--------+--------+-------------------------------+--------+           PSV cm/sEDV cm/sStenosisDescribe                       Comments +----------+--------+--------+--------+-------------------------------+--------+ CCA Prox  109     24  tortuous +----------+--------+--------+--------+-------------------------------+--------+ CCA Distal123     21                                                      +----------+--------+--------+--------+-------------------------------+--------+ ICA Prox  119     23      1-39%   focal, hyperechoic and calcific         +----------+--------+--------+--------+-------------------------------+--------+ ICA Distal111     29                                                       +----------+--------+--------+--------+-------------------------------+--------+ ECA       141     20                                                      +----------+--------+--------+--------+-------------------------------+--------+ +----------+--------+--------+--------+------------+ SubclavianPSV cm/sEDV cm/sDescribeArm Pressure +----------+--------+--------+--------+------------+           172                     157          +----------+--------+--------+--------+------------+ +---------+--------+--+--------+----------------------------+ VertebralPSV cm/s44EDV cm/sAntegrade and High resistant +---------+--------+--+--------+----------------------------+  ABI Findings: +---------+------------------+-----+-------------------+--------+ Right    Rt Pressure (mmHg)IndexWaveform           Comment  +---------+------------------+-----+-------------------+--------+ Brachial 156                    triphasic                   +---------+------------------+-----+-------------------+--------+ PTA      73                0.46 dampened monophasic         +---------+------------------+-----+-------------------+--------+ DP       97                0.62 dampened monophasic         +---------+------------------+-----+-------------------+--------+ Great Toe72                0.46                             +---------+------------------+-----+-------------------+--------+ +---------+------------------+-----+-------------------+-------+ Left     Lt Pressure (mmHg)IndexWaveform           Comment +---------+------------------+-----+-------------------+-------+ Brachial 157                    triphasic                  +---------+------------------+-----+-------------------+-------+ PTA      68                0.43 dampened monophasic        +---------+------------------+-----+-------------------+-------+ DP       75                     dampened  monophasic        +---------+------------------+-----+-------------------+-------+ Alison Murray  0.34                            +---------+------------------+-----+-------------------+-------+ +-------+---------------+----------------+ ABI/TBIToday's ABI/TBIPrevious ABI/TBI +-------+---------------+----------------+ Right  0.62/0.46                       +-------+---------------+----------------+ Left   0.48/0.34                       +-------+---------------+----------------+  Right Doppler Findings: +--------+--------+-----+---------+----------------+ Site    PressureIndexDoppler  Comments         +--------+--------+-----+---------+----------------+ [id number]          triphasic                 +--------+--------+-----+---------+----------------+ Radial               triphasichigh bifurcation +--------+--------+-----+---------+----------------+ Ulnar                biphasic                  +--------+--------+-----+---------+----------------+  Left Doppler Findings: +--------+--------+-----+---------+--------+ Site    PressureIndexDoppler  Comments +--------+--------+-----+---------+--------+ [id number]          triphasic         +--------+--------+-----+---------+--------+ Radial               biphasic          +--------+--------+-----+---------+--------+ Ulnar                triphasic         +--------+--------+-----+---------+--------+  Summary: Right Carotid: Velocities in the right ICA are consistent with a 1-39% stenosis. Left Carotid: Velocities in the left ICA are consistent with a 1-39% stenosis. Vertebrals: Bilateral vertebral arteries demonstrate antegrade flow. Left             vertebral artery demonstrates high resistant flow. Right ABI: Resting right ankle-brachial index indicates moderate right lower extremity arterial disease. The right toe-brachial index is abnormal. Left ABI: Resting left ankle-brachial index  indicates severe left lower extremity arterial disease. The left toe-brachial index is abnormal. Right Upper Extremity: Doppler waveform obliterate with right radial compression. Doppler waveforms remain within normal limits with right ulnar compression. Left Upper Extremity: Doppler waveforms remain within normal limits with left radial compression. Doppler waveforms decrease <50% with left ulnar compression.    Preliminary     Cardiac Studies   Cardiac cath 03/28/18  Prox RCA lesion is 80% stenosed.  Mid RCA-1 lesion is 80% stenosed.  Mid RCA-2 lesion is 90% stenosed.  Dist RCA lesion is 99% stenosed.  Prox Cx lesion is 80% stenosed.  Ost 2nd Mrg lesion is 70% stenosed.  Ost LAD to Prox LAD lesion is 95% stenosed.  Prox LAD lesion is 80% stenosed.  Ost 1st Diag lesion is 90% stenosed.  Dist LAD lesion is 80% stenosed.  Severe three-vessel coronary obstructive disease with 85% in-stent restenosis in the proximal LAD stent followed by 80% LAD stenosis, 90% first diagonal stenosis and 80% apical LAD stenosis; 80% proximal left circumflex stenosis with 70% circumflex marginal stenosis; and calcified RCA with diffuse disease with stenoses of 80% proximal mid 90% before the acute margin and 99% after the acute margin with extensive collateralization from the very proximal conus proximal branches supplying the distal RCA.  LVEDP 16 mmHg.  RECOMMENDATION: Surgical consultation for CABG revascularization. High potency statin therapy get LDL less than 70. The patient will be started on  heparin 8 hours post sheath discontinuance. Continue aspirin. Will avoid P2 Y 12 inhibition due to need for CABG revascularization.   ECHO 03/26/18 Study Conclusions  - Left ventricle: The cavity size was normal. Wall thickness was normal. Systolic function was normal. The estimated ejection fraction was in the range of 60% to 65%. There is akinesis of the mid-apicalanteroseptal myocardium.  Doppler parameters are consistent with abnormal left ventricular relaxation (grade 1 diastolic dysfunction). - Aortic valve: Trileaflet; mildly calcified leaflets. There was trivial regurgitation. - Mitral valve: Mildly calcified annulus. There was trivial regurgitation. - Left atrium: The atrium was at the upper limits of normal in size. - Right atrium: Central venous pressure (est): 3 mm Hg. - Atrial septum: No defect or patent foramen ovale was identified. - Tricuspid valve: There was physiologic regurgitation. - Pulmonary arteries: Systolic pressure could not be accurately estimated. - Pericardium, extracardiac: There was no pericardial effusion.  Patient Profile     72 y.o. male w/ PMHof CAD (s/pstentingof LAD in 1996 and angioplasty alone to LAD in1999), PAD (known bilateral SFA occlusions by angiography in 07/2016), HTN, HLD, Type 2 DM,depression andintermittent tobacco usewho [facility] for evaluation of chest pain.transferred to [facility] for Cath.   Assessment & Plan    Chest pain/unstable angina with known CAD (hx stent to LAD 1996 and [place]) -Symptomatically stable. --flat troponins but echo with akinesis of the mid-apicalanteroseptal myocardium  EF normal. --cath with severe 3 vessel disease with 85% in stent restenosis in prox LAD followed by 80% LAD stenosis, 90% first diagonal stenosis and 80% apical LAD stenosis; 80% proximal left circumflex stenosis with 70% circumflex marginal stenosis; and calcified RCA with diffuse disease with stenoses of 80% proximal mid 90% before the acute margin and 99% after the acute margin with extensive collateralization from the very proximal conus proximal branches supplying the distal RCA. -- Evaluated by CT surgery with plans for CABG on December 10 --IV heparin, no P2Y12- was not on prior to admit  --on ASA 81 mg, atorvastatin, and metoprolol.  PAD  --hx bil SFA occlusions by cath 2018  with medical management and abnormal ABIs --carotid dopplers with 1-39% stenosis bil.  On aspirin and statin  HTN  -Blood pressure is normal today. --on HCTZ 12.5 and losartan 25 mg daily held for cath. SCr 1.25 yesterday.  HLD on simvastatin up to 80 but pt self reduced due to myalgias.  Currently on lipitor 80. If myalgias develop may need Repatha  DM-2  --metformin on hold glucose 217 to 131 on SSI --HgbA1c of 6.9 this admit  CKD-3  --cr 1.25 today improved from 1.51 on admit  Intermittent tobacco use,  --pt stopped smoking 4 weeks ago.   For questions or updates, please contact [place] Please consult [email or web address] for contact info under Cardiology/STEMI.      Signed, Kate Sable, MD  03/29/2018, 12:52 PM

## 2018-03-29 NOTE — Progress Notes (Signed)
Per pharmacy tonight, they are waiting on patient's private stock of Apriso medication to dispense medication dose.  This has been going on for 4 days.  I will report this to next shift nurse.

## 2018-03-30 DIAGNOSIS — Z720 Tobacco use: Secondary | ICD-10-CM

## 2018-03-30 DIAGNOSIS — F341 Dysthymic disorder: Secondary | ICD-10-CM

## 2018-03-30 LAB — CBC
HCT: 33.3 % — ABNORMAL LOW (ref 39.0–52.0)
Hemoglobin: 11.1 g/dL — ABNORMAL LOW (ref 13.0–17.0)
MCH: 31.8 pg (ref 26.0–34.0)
MCHC: 33.3 g/dL (ref 30.0–36.0)
MCV: 95.4 fL (ref 80.0–100.0)
Platelets: 143 10*3/uL — ABNORMAL LOW (ref 150–400)
RBC: 3.49 MIL/uL — ABNORMAL LOW (ref 4.22–5.81)
RDW: 12.1 % (ref 11.5–15.5)
WBC: 8.4 10*3/uL (ref 4.0–10.5)
nRBC: 0 % (ref 0.0–0.2)

## 2018-03-30 LAB — GLUCOSE, CAPILLARY
Glucose-Capillary: 110 mg/dL — ABNORMAL HIGH (ref 70–99)
Glucose-Capillary: 115 mg/dL — ABNORMAL HIGH (ref 70–99)
Glucose-Capillary: 88 mg/dL (ref 70–99)
Glucose-Capillary: 152 mg/dL — ABNORMAL HIGH (ref 70–99)

## 2018-03-30 LAB — HEPARIN LEVEL (UNFRACTIONATED): Heparin Unfractionated: 0.57 IU/mL (ref 0.30–0.70)

## 2018-03-30 MED ORDER — MESALAMINE 1.2 G PO TBEC
2.4000 g | DELAYED_RELEASE_TABLET | Freq: Every day | ORAL | Status: DC
  Administered 2018-03-30 – 2018-04-09 (×10): 2.4 g via ORAL
  Filled 2018-03-30 (×17): qty 2

## 2018-03-30 MED ORDER — MESALAMINE 1.2 G PO TBEC: 2.4000 g | DELAYED_RELEASE_TABLET | Freq: Every day | ORAL | Status: DC

## 2018-03-30 NOTE — Plan of Care (Signed)
Cardiac Monitor. Assess pain routine and as needed.

## 2018-03-30 NOTE — Progress Notes (Signed)
[place] for Heparin Indication: severe 3v CAD - awaiting CABG  Allergies  Allergen Reactions  . Flomax [Tamsulosin Hcl]     High dose   . Morphine And Related     Patient Measurements: Height: 5\' 11"  (180.3 cm) Weight: 164 lb 6.4 oz (74.6 kg) IBW/kg (Calculated) : 75.3 Heparin Dosing Weight: 75kg  Vital Signs: Temp: 98.3 F (36.8 C) (12/08 0455) Temp Source: Oral (12/08 0455) BP: 130/72 (12/08 0455) Pulse Rate: 55 (12/08 0455)  Labs: Recent Labs    03/28/18 0240 03/28/18 1116 03/29/18 0354 03/30/18 0411  HGB 11.0*  --  11.0* 11.1*  HCT 34.7*  --  34.3* 33.3*  PLT 170  --  168 143*  LABPROT 14.4  --   --   --   INR 1.13  --   --   --   HEPARINUNFRC 0.33 0.30 0.54 0.57  CREATININE 1.25*  --   --   --     Estimated Creatinine Clearance: 56.4 mL/min (A) (by C-G formula based on SCr of 1.25 mg/dL (H)).  Assessment: 25 yom s/p cath 12/5, which showed severe 3v CAD. Plan for CABG 12/10. Pt continues on heparin.  Heparin level remains therapeutic. Hg low but stable, plt down a bit to 143. No bleeding documented.  Goal of Therapy:  Heparin level 0.3-0.7 units/ml Monitor platelets by anticoagulation protocol: Yes   Plan:  Continue heparin at 1050 units/hr Monitor daily heparin level and CBC, s/sx bleeding CABG planned for 12/10  Elicia Lamp, PharmD, BCPS Clinical Pharmacist Clinical phone [phone] Please check AMION for all [place] contact numbers 03/30/2018 10:50 AM

## 2018-03-30 NOTE — Progress Notes (Signed)
Progress Note  Patient Name: [name] Date of Encounter: 03/30/2018  Primary Cardiologist: Peter Martinique, MD   Subjective   Doing well. Denies chest pain and shortness of breath. Has some anxiety which he says is chronic.  Inpatient Medications    Scheduled Meds: . aspirin  81 mg Oral Daily  . atorvastatin  80 mg Oral q1800  . docusate sodium  100 mg Oral Daily  . insulin aspart  0-15 Units Subcutaneous TID WC  . mesalamine  0.375 g Oral BID  . metoprolol tartrate  12.5 mg Oral BID  . pantoprazole  40 mg Oral Daily  . PARoxetine  20 mg Oral Daily  . sodium chloride flush  3 mL Intravenous Q12H   Continuous Infusions: . sodium chloride 125 mL/hr at 03/29/18 2141  . sodium chloride    . heparin 1,050 Units/hr (03/29/18 1844)   PRN Meds: sodium chloride, acetaminophen, albuterol, hydrOXYzine, ondansetron (ZOFRAN) IV, ondansetron **OR** [DISCONTINUED] ondansetron (ZOFRAN) IV, sodium chloride flush   Vital Signs    Vitals:   03/29/18 0444 03/29/18 1356 03/29/18 1934 03/30/18 0455  BP: 132/65 127/60 124/60 130/72  Pulse: 61 (!) 51 (!) 50 (!) 55  Resp:   17 20  Temp: 98 F (36.7 C) 98.2 F (36.8 C) 97.8 F (36.6 C) 98.3 F (36.8 C)  TempSrc: Oral Oral Oral Oral  SpO2: 98% 98% 98% 97%  Weight: 74.6 kg   74.6 kg  Height:        Intake/Output Summary (Last 24 hours) at 03/30/2018 1027 Last data filed at 03/30/2018 0340 Gross per 24 hour  Intake 100 ml  Output 1700 ml  Net -1600 ml   Filed Weights   03/28/18 0458 03/29/18 0444 03/30/18 0455  Weight: 75.4 kg 74.6 kg 74.6 kg    Telemetry    Sinus rhythm/sinus brady, PAC's, sinus arrhythmia - Personally Reviewed  ECG    No new tracings - Personally Reviewed  Physical Exam   GEN: No acute distress.   Neck: No JVD Cardiac: RRR, no murmurs, rubs, or gallops.  Respiratory: Clear to auscultation bilaterally. GI: Soft, nontender, non-distended  MS: No edema; No deformity. Neuro:  Nonfocal    Psych: Normal affect   Labs    Chemistry Recent Labs  Lab 03/26/18 0216 03/27/18 0500 03/28/18 0240  NA 138 140 141  K 4.0 4.2 4.1  CL 107 108 110  CO2 22 25 25   GLUCOSE 221* 168* 155*  BUN 27* 23 20  CREATININE 1.51* 1.25* 1.25*  CALCIUM 8.9 8.9 8.7*  GFRNONAA 45* 57* 57*  GFRAA 53* >60 >60  ANIONGAP 9 7 6      Hematology Recent Labs  Lab 03/28/18 0240 03/29/18 0354 03/30/18 0411  WBC 8.6 8.7 8.4  RBC 3.59* 3.56* 3.49*  HGB 11.0* 11.0* 11.1*  HCT 34.7* 34.3* 33.3*  MCV 96.7 96.3 95.4  MCH 30.6 30.9 31.8  MCHC 31.7 32.1 33.3  RDW 12.2 12.1 12.1  PLT 170 168 143*    Cardiac Enzymes Recent Labs  Lab 03/26/18 1016 03/26/18 1539  TROPONINI 0.04* 0.03*    Recent Labs  Lab 03/26/18 0225 03/26/18 0425  TROPIPOC 0.03 0.02     BNPNo results for input(s): BNP, PROBNP in the last 168 hours.   DDimer No results for input(s): DDIMER in the last 168 hours.   Radiology    No results found.  Cardiac Studies   Cardiac cath 03/28/18  Prox RCA lesion is 80% stenosed.  Mid RCA-1  lesion is 80% stenosed.  Mid RCA-2 lesion is 90% stenosed.  Dist RCA lesion is 99% stenosed.  Prox Cx lesion is 80% stenosed.  Ost 2nd Mrg lesion is 70% stenosed.  Ost LAD to Prox LAD lesion is 95% stenosed.  Prox LAD lesion is 80% stenosed.  Ost 1st Diag lesion is 90% stenosed.  Dist LAD lesion is 80% stenosed.  Severe three-vessel coronary obstructive disease with 85% in-stent restenosis in the proximal LAD stent followed by 80% LAD stenosis, 90% first diagonal stenosis and 80% apical LAD stenosis; 80% proximal left circumflex stenosis with 70% circumflex marginal stenosis; and calcified RCA with diffuse disease with stenoses of 80% proximal mid 90% before the acute margin and 99% after the acute margin with extensive collateralization from the very proximal conus proximal branches supplying the distal RCA.  LVEDP 16 mmHg.  RECOMMENDATION: Surgical consultation for  CABG revascularization. High potency statin therapy get LDL less than 70. The patient will be started on heparin 8 hours post sheath discontinuance. Continue aspirin. Will avoid P2 Y 12 inhibition due to need for CABG revascularization.  ECHO 03/26/18 Study Conclusions  - Left ventricle: The cavity size was normal. Wall thickness was normal. Systolic function was normal. The estimated ejection fraction was in the range of 60% to 65%. There is akinesis of the mid-apicalanteroseptal myocardium. Doppler parameters are consistent with abnormal left ventricular relaxation (grade 1 diastolic dysfunction). - Aortic valve: Trileaflet; mildly calcified leaflets. There was trivial regurgitation. - Mitral valve: Mildly calcified annulus. There was trivial regurgitation. - Left atrium: The atrium was at the upper limits of normal in size. - Right atrium: Central venous pressure (est): 3 mm Hg. - Atrial septum: No defect or patent foramen ovale was identified. - Tricuspid valve: There was physiologic regurgitation. - Pulmonary arteries: Systolic pressure could not be accurately estimated. - Pericardium, extracardiac: There was no pericardial effusion.  Patient Profile     72 y.o. male w/ PMHof CAD (s/pstentingof LAD in 1996 and angioplasty alone to LAD in1999), PAD (known bilateral SFA occlusions by angiography in 07/2016), HTN, HLD, Type 2 DM,depression andintermittent tobacco usewho [facility] for evaluation of chest pain.transferred to [facility] for Cath.  Assessment & Plan    Chest pain/unstable angina with known CAD(hx stent to LAD 1996 and PTCA 1999) -Symptomatically stable. --flat troponins but echo with akinesis of the mid-apicalanteroseptal myocardium EF normal. --cath with severe 3 vessel disease with 85% in stent restenosis in prox LADfollowed by 80% LAD stenosis, 90% first diagonal stenosis and 80% apical LAD stenosis; 80%  proximal left circumflex stenosis with 70% circumflex marginal stenosis; and calcified RCA with diffuse disease with stenoses of 80% proximal mid 90% before the acute margin and 99% after the acute margin with extensive collateralization from the very proximal conus proximal branches supplying the distal RCA. --Evaluated by CT surgery with plans for CABG on December 10 --IV heparin, no P2Y12- was not on prior to admit --on ASA 81 mg, atorvastatin, and metoprolol.  PAD  --hx bil SFA occlusions by cath 2018 with medical management and abnormal ABIs --carotid dopplers with 1-39% stenosis bil.  On aspirin and statin  HTN  -Blood pressure is normal today. --on HCTZ 12.5 and losartan 25 mg daily held for cath. SCr 1.25 on 12/6.  HLD on simvastatin up to 80 but pt self reduced due to myalgias. Currently on lipitor 80. If myalgias develop may need Repatha  DM-2  --metformin on hold glucose 217 to 131  on SSI --HgbA1c of 6.9 this admit  CKD-3  --cr 1.25 on 12/6, improved from 1.51 on admit  Intermittent tobacco use,  --pt stopped smoking 4 weeks ago.  For questions or updates, please contact [place] Please consult [email or web address] for contact info under Cardiology/STEMI.      Signed, Kate Sable, MD  03/30/2018, 10:27 AM

## 2018-03-31 DIAGNOSIS — I200 Unstable angina: Secondary | ICD-10-CM

## 2018-03-31 DIAGNOSIS — I2510 Atherosclerotic heart disease of native coronary artery without angina pectoris: Secondary | ICD-10-CM

## 2018-03-31 DIAGNOSIS — I2583 Coronary atherosclerosis due to lipid rich plaque: Secondary | ICD-10-CM

## 2018-03-31 DIAGNOSIS — E7800 Pure hypercholesterolemia, unspecified: Secondary | ICD-10-CM

## 2018-03-31 LAB — BASIC METABOLIC PANEL
Anion gap: 11 (ref 5–15)
BUN: 15 mg/dL (ref 8–23)
CO2: 21 mmol/L — AB (ref 22–32)
Calcium: 8.8 mg/dL — ABNORMAL LOW (ref 8.9–10.3)
Chloride: 106 mmol/L (ref 98–111)
Creatinine, Ser: 1.29 mg/dL — ABNORMAL HIGH (ref 0.61–1.24)
GFR calc Af Amer: >60 mL/min (ref >60)
GFR calc non Af Amer: 55 mL/min — ABNORMAL LOW (ref >60)
Glucose, Bld: 168 mg/dL — ABNORMAL HIGH (ref 70–99)
Potassium: 4.2 mmol/L (ref 3.5–5.1)
Sodium: 138 mmol/L (ref 135–145)

## 2018-03-31 LAB — HEPARIN LEVEL (UNFRACTIONATED): Heparin Unfractionated: 0.64 IU/mL (ref 0.30–0.70)

## 2018-03-31 LAB — BLOOD GAS, ARTERIAL
Acid-base deficit: 2 mmol/L (ref 0.0–2.0)
Bicarbonate: 22 mmol/L (ref 20.0–28.0)
Drawn by: 358491
FIO2: 21
O2 Saturation: 96.8 %
Patient temperature: 98.6
pCO2 arterial: 36.3 mmHg (ref 32.0–48.0)
pH, Arterial: 7.4 (ref 7.350–7.450)
pO2, Arterial: 91 mmHg (ref 83.0–108.0)

## 2018-03-31 LAB — CBC
HCT: 34.7 % — ABNORMAL LOW (ref 39.0–52.0)
Hemoglobin: 11.2 g/dL — ABNORMAL LOW (ref 13.0–17.0)
MCH: 30.9 pg (ref 26.0–34.0)
MCHC: 32.3 g/dL (ref 30.0–36.0)
MCV: 95.6 fL (ref 80.0–100.0)
Platelets: 161 10*3/uL (ref 150–400)
RBC: 3.63 MIL/uL — ABNORMAL LOW (ref 4.22–5.81)
RDW: 12.1 % (ref 11.5–15.5)
WBC: 7.8 10*3/uL (ref 4.0–10.5)
nRBC: 0 % (ref 0.0–0.2)

## 2018-03-31 LAB — SURGICAL PCR SCREEN
MRSA, PCR: NEGATIVE
Staphylococcus aureus: POSITIVE — AB

## 2018-03-31 LAB — GLUCOSE, CAPILLARY
Glucose-Capillary: 115 mg/dL — ABNORMAL HIGH (ref 70–99)
Glucose-Capillary: 158 mg/dL — ABNORMAL HIGH (ref 70–99)
Glucose-Capillary: 98 mg/dL (ref 70–99)
Glucose-Capillary: 107 mg/dL — ABNORMAL HIGH (ref 70–99)

## 2018-03-31 LAB — PREPARE RBC (CROSSMATCH)

## 2018-03-31 MED ORDER — SODIUM CHLORIDE 0.9 % IV SOLN
1.5000 g | INTRAVENOUS | Status: AC
  Administered 2018-04-01: 1.5 g via INTRAVENOUS
  Filled 2018-03-31: qty 1.5

## 2018-03-31 MED ORDER — VANCOMYCIN HCL 10 G IV SOLR
1250.0000 mg | INTRAVENOUS | Status: AC
  Administered 2018-04-01: 1250 mg via INTRAVENOUS
  Filled 2018-03-31: qty 1250

## 2018-03-31 MED ORDER — NOREPINEPHRINE 4 MG/250ML-% IV SOLN
0.0000 ug/min | INTRAVENOUS | Status: DC
  Administered 2018-04-01: 1 ug/min via INTRAVENOUS
  Filled 2018-03-31 (×2): qty 250

## 2018-03-31 MED ORDER — TRANEXAMIC ACID 1000 MG/10ML IV SOLN
1.5000 mg/kg/h | INTRAVENOUS | Status: AC
  Administered 2018-04-01: 1.5 mg/kg/h via INTRAVENOUS
  Filled 2018-03-31: qty 25

## 2018-03-31 MED ORDER — DOPAMINE-DEXTROSE 3.2-5 MG/ML-% IV SOLN
0.0000 ug/kg/min | INTRAVENOUS | Status: DC
  Filled 2018-03-31: qty 250

## 2018-03-31 MED ORDER — DIAZEPAM 2 MG PO TABS
2.0000 mg | ORAL_TABLET | Freq: Once | ORAL | Status: AC
  Administered 2018-04-01: 2 mg via ORAL
  Filled 2018-03-31: qty 1

## 2018-03-31 MED ORDER — SODIUM CHLORIDE 0.9 % IV SOLN
INTRAVENOUS | Status: DC
  Filled 2018-03-31: qty 30

## 2018-03-31 MED ORDER — NITROGLYCERIN IN D5W 200-5 MCG/ML-% IV SOLN
2.0000 ug/min | INTRAVENOUS | Status: AC
  Administered 2018-04-01: 5 ug/min via INTRAVENOUS
  Filled 2018-03-31: qty 250

## 2018-03-31 MED ORDER — CHLORHEXIDINE GLUCONATE CLOTH 2 % EX PADS
6.0000 | MEDICATED_PAD | Freq: Every day | CUTANEOUS | Status: DC
  Administered 2018-03-31: 6 via TOPICAL

## 2018-03-31 MED ORDER — PHENYLEPHRINE HCL-NACL 20-0.9 MG/250ML-% IV SOLN
30.0000 ug/min | INTRAVENOUS | Status: AC
  Administered 2018-04-01: 25 ug/min via INTRAVENOUS
  Filled 2018-03-31: qty 250

## 2018-03-31 MED ORDER — PLASMA-LYTE 148 IV SOLN
INTRAVENOUS | Status: AC
  Administered 2018-04-01: 500 mL
  Filled 2018-03-31: qty 2.5

## 2018-03-31 MED ORDER — CHLORHEXIDINE GLUCONATE 4 % EX LIQD
60.0000 mL | Freq: Once | CUTANEOUS | Status: AC
  Administered 2018-03-31: 4 via TOPICAL
  Filled 2018-03-31: qty 60

## 2018-03-31 MED ORDER — TRANEXAMIC ACID (OHS) PUMP PRIME SOLUTION
2.0000 mg/kg | INTRAVENOUS | Status: DC
  Filled 2018-03-31: qty 1.49

## 2018-03-31 MED ORDER — METOPROLOL TARTRATE 12.5 MG HALF TABLET
12.5000 mg | ORAL_TABLET | Freq: Once | ORAL | Status: AC
  Administered 2018-04-01: 12.5 mg via ORAL
  Filled 2018-03-31: qty 1

## 2018-03-31 MED ORDER — INSULIN REGULAR(HUMAN) IN NACL 100-0.9 UT/100ML-% IV SOLN
INTRAVENOUS | Status: AC
  Administered 2018-04-01: 1.4 [IU]/h via INTRAVENOUS
  Filled 2018-03-31: qty 100

## 2018-03-31 MED ORDER — TRANEXAMIC ACID (OHS) BOLUS VIA INFUSION
15.0000 mg/kg | INTRAVENOUS | Status: AC
  Administered 2018-04-01: 1119 mg via INTRAVENOUS
  Filled 2018-03-31: qty 1119

## 2018-03-31 MED ORDER — EPINEPHRINE PF 1 MG/ML IJ SOLN
0.0000 ug/min | INTRAVENOUS | Status: DC
  Filled 2018-03-31: qty 4

## 2018-03-31 MED ORDER — DEXMEDETOMIDINE HCL IN NACL 400 MCG/100ML IV SOLN
0.1000 ug/kg/h | INTRAVENOUS | Status: AC
  Administered 2018-04-01: 0.7 ug/kg/h via INTRAVENOUS
  Filled 2018-03-31: qty 100

## 2018-03-31 MED ORDER — CHLORHEXIDINE GLUCONATE 0.12 % MT SOLN
15.0000 mL | Freq: Once | OROMUCOSAL | Status: AC
  Administered 2018-04-01: 15 mL via OROMUCOSAL
  Filled 2018-03-31: qty 15

## 2018-03-31 MED ORDER — TEMAZEPAM 15 MG PO CAPS
15.0000 mg | ORAL_CAPSULE | Freq: Once | ORAL | Status: AC | PRN
  Administered 2018-03-31: 15 mg via ORAL
  Filled 2018-03-31: qty 1

## 2018-03-31 MED ORDER — SODIUM CHLORIDE 0.9 % IV SOLN
750.0000 mg | INTRAVENOUS | Status: DC
  Filled 2018-03-31: qty 750

## 2018-03-31 MED ORDER — CHLORHEXIDINE GLUCONATE 4 % EX LIQD
60.0000 mL | Freq: Once | CUTANEOUS | Status: AC
  Administered 2018-04-01: 4 via TOPICAL
  Filled 2018-03-31: qty 60

## 2018-03-31 MED ORDER — MUPIROCIN 2 % EX OINT
1.0000 "application " | TOPICAL_OINTMENT | Freq: Two times a day (BID) | CUTANEOUS | Status: AC
  Administered 2018-03-31 – 2018-04-04 (×9): 1 via NASAL
  Filled 2018-03-31 (×2): qty 22

## 2018-03-31 MED ORDER — MILRINONE LACTATE IN DEXTROSE 20-5 MG/100ML-% IV SOLN
0.3000 ug/kg/min | INTRAVENOUS | Status: AC
  Administered 2018-04-01: 0.25 ug/kg/min via INTRAVENOUS
  Filled 2018-03-31: qty 100

## 2018-03-31 MED ORDER — MAGNESIUM SULFATE 50 % IJ SOLN
40.0000 meq | INTRAMUSCULAR | Status: DC
  Filled 2018-03-31: qty 9.85

## 2018-03-31 MED ORDER — POTASSIUM CHLORIDE 2 MEQ/ML IV SOLN
80.0000 meq | INTRAVENOUS | Status: DC
  Filled 2018-03-31: qty 40

## 2018-03-31 MED ORDER — HYDROCORTISONE 2.5 % RE CREA
TOPICAL_CREAM | Freq: Four times a day (QID) | RECTAL | Status: DC
  Administered 2018-03-31 – 2018-04-07 (×6): via RECTAL
  Filled 2018-03-31 (×2): qty 28.35

## 2018-03-31 MED ORDER — BISACODYL 5 MG PO TBEC
5.0000 mg | DELAYED_RELEASE_TABLET | Freq: Once | ORAL | Status: AC
  Administered 2018-03-31: 5 mg via ORAL
  Filled 2018-03-31: qty 1

## 2018-03-31 NOTE — Progress Notes (Signed)
Progress Note  Patient Name: [name] Date of Encounter: 03/31/2018  Primary Cardiologist: Peter Martinique, MD   Subjective   Slept poorly but no complaints of CP or SOB this morning. Anticipating CABG tomorrow.  Inpatient Medications    Scheduled Meds: . aspirin  81 mg Oral Daily  . atorvastatin  80 mg Oral q1800  . bisacodyl  5 mg Oral Once  . chlorhexidine  60 mL Topical Once   And  . [START ON 04/01/2018] chlorhexidine  60 mL Topical Once  . [START ON 04/01/2018] chlorhexidine  15 mL Mouth/Throat Once  . [START ON 04/01/2018] diazepam  2 mg Oral Once  . docusate sodium  100 mg Oral Daily  . insulin aspart  0-15 Units Subcutaneous TID WC  . mesalamine  2.4 g Oral Q breakfast  . metoprolol tartrate  12.5 mg Oral BID  . [START ON 04/01/2018] metoprolol tartrate  12.5 mg Oral Once  . pantoprazole  40 mg Oral Daily  . PARoxetine  20 mg Oral Daily  . sodium chloride flush  3 mL Intravenous Q12H   Continuous Infusions: . sodium chloride 125 mL/hr at 03/30/18 2241  . sodium chloride    . heparin 1,050 Units/hr (03/30/18 2008)   PRN Meds: sodium chloride, acetaminophen, albuterol, hydrOXYzine, ondansetron (ZOFRAN) IV, ondansetron **OR** [DISCONTINUED] ondansetron (ZOFRAN) IV, sodium chloride flush, temazepam   Vital Signs    Vitals:   03/30/18 1951 03/30/18 2242 03/31/18 0502 03/31/18 0820  BP: 136/61 132/67 140/70 139/67  Pulse: 75 65 (!) 54 (!) 54  Resp: 18  15   Temp: 97.8 F (36.6 C)  98.1 F (36.7 C)   TempSrc: Oral  Oral   SpO2: 98%  98%   Weight:   74.6 kg   Height:        Intake/Output Summary (Last 24 hours) at 03/31/2018 0836 Last data filed at 03/31/2018 0200 Gross per 24 hour  Intake 856.5 ml  Output 1450 ml  Net -593.5 ml   Filed Weights   03/29/18 0444 03/30/18 0455 03/31/18 0502  Weight: 74.6 kg 74.6 kg 74.6 kg    Telemetry    Sinus rhythm/ sinus brady with occasional PACs - Personally Reviewed  Physical Exam   GEN: Laying  in bed in no acute distress.   Neck: No JVD, no carotid bruits Cardiac: RRR, no murmurs, rubs, or gallops.  Respiratory: Clear to auscultation bilaterally, no wheezes/ rales/ rhonchi GI: NABS, Soft, nontender, non-distended  MS: No edema; No deformity. Neuro:  Nonfocal, moving all extremities spontaneously Psych: Normal affect   Labs    Chemistry Recent Labs  Lab 03/26/18 0216 03/27/18 0500 03/28/18 0240  NA 138 140 141  K 4.0 4.2 4.1  CL 107 108 110  CO2 22 25 25   GLUCOSE 221* 168* 155*  BUN 27* 23 20  CREATININE 1.51* 1.25* 1.25*  CALCIUM 8.9 8.9 8.7*  GFRNONAA 45* 57* 57*  GFRAA 53* >60 >60  ANIONGAP 9 7 6      Hematology Recent Labs  Lab 03/29/18 0354 03/30/18 0411 03/31/18 0406  WBC 8.7 8.4 7.8  RBC 3.56* 3.49* 3.63*  HGB 11.0* 11.1* 11.2*  HCT 34.3* 33.3* 34.7*  MCV 96.3 95.4 95.6  MCH 30.9 31.8 30.9  MCHC 32.1 33.3 32.3  RDW 12.1 12.1 12.1  PLT 168 143* 161    Cardiac Enzymes Recent Labs  Lab 03/26/18 1016 03/26/18 1539  TROPONINI 0.04* 0.03*    Recent Labs  Lab 03/26/18 0225 03/26/18 0425  TROPIPOC 0.03 0.02     BNPNo results for input(s): BNP, PROBNP in the last 168 hours.   DDimer No results for input(s): DDIMER in the last 168 hours.   Radiology    No results found.  Cardiac Studies   Cardiac cath 03/28/18  Prox RCA lesion is 80% stenosed.  Mid RCA-1 lesion is 80% stenosed.  Mid RCA-2 lesion is 90% stenosed.  Dist RCA lesion is 99% stenosed.  Prox Cx lesion is 80% stenosed.  Ost 2nd Mrg lesion is 70% stenosed.  Ost LAD to Prox LAD lesion is 95% stenosed.  Prox LAD lesion is 80% stenosed.  Ost 1st Diag lesion is 90% stenosed.  Dist LAD lesion is 80% stenosed.  Severe three-vessel coronary obstructive disease with 85% in-stent restenosis in the proximal LAD stent followed by 80% LAD stenosis, 90% first diagonal stenosis and 80% apical LAD stenosis; 80% proximal left circumflex stenosis with 70% circumflex marginal  stenosis; and calcified RCA with diffuse disease with stenoses of 80% proximal mid 90% before the acute margin and 99% after the acute margin with extensive collateralization from the very proximal conus proximal branches supplying the distal RCA.  LVEDP 16 mmHg.  RECOMMENDATION: Surgical consultation for CABG revascularization. High potency statin therapy get LDL less than 70. The patient will be started on heparin 8 hours post sheath discontinuance. Continue aspirin. Will avoid P2 Y 12 inhibition due to need for CABG revascularization.  ECHO 03/26/18 Study Conclusions  - Left ventricle: The cavity size was normal. Wall thickness was normal. Systolic function was normal. The estimated ejection fraction was in the range of 60% to 65%. There is akinesis of the mid-apicalanteroseptal myocardium. Doppler parameters are consistent with abnormal left ventricular relaxation (grade 1 diastolic dysfunction). - Aortic valve: Trileaflet; mildly calcified leaflets. There was trivial regurgitation. - Mitral valve: Mildly calcified annulus. There was trivial regurgitation. - Left atrium: The atrium was at the upper limits of normal in size. - Right atrium: Central venous pressure (est): 3 mm Hg. - Atrial septum: No defect or patent foramen ovale was identified. - Tricuspid valve: There was physiologic regurgitation. - Pulmonary arteries: Systolic pressure could not be accurately estimated. - Pericardium, extracardiac: There was no pericardial effusion.  Patient Profile     72 y.o. male w/ PMHof CAD (s/pstentingof LAD in 1996 and angioplasty alone to LAD in1999), PAD (known bilateral SFA occlusions by angiography in 07/2016), HTN, HLD, Type 2 DM,depression andintermittent tobacco usewho [facility] for evaluation of chest pain.transferred to [facility] for Cath.  Assessment & Plan    1. Unstable angina in patient with CAD s/p stent to LAD  1996 and PTCA 1999: patient presented with chest pain. Trop trend flat but Echo revealed akinesis of the m-apical anteroseptal myocardium with normal EF. He underwent LHC 03/28/18 which showed severe 3 vessel disease. Seen by CT surgery with plans for CABG 04/01/18.  - Continue heparin gtt, aspirin, statin, and metoprolol  2. HTN: BP stable overall but persistently with SBP >130 - Continue metoprolol 12.5mg  BID - bradycardia limiting titration - May need additional medication titration following CABG  3. PAD: hx of b/l SFA occlusion on cath 2018 with medical management.  - Continue aspirin and statin  4. HLD: LDL 105 on lipids this admission. Intolerant to higher dose simvastatin in the past. Currently on atorvastatin 80mg  daily.  - Continue atorvastatin for now - if develops myalgias, may need referral to lipid clinic for PSK-9 inhibitor consideration  5.  DM type 2: Hgb A1C 6.9 this admission; at goal of <7 - Continue ISS while inpatient  6. CKD stage 3: Cr 1.25 12/6 - Will check a BMET today in anticipation of CABG tomorrow - Continue to monitor closely post-op     For questions or updates, please contact [place] Please consult [email or web address] for contact info under Cardiology/STEMI.      Signed, Abigail Butts, PA-C  03/31/2018, 8:36 AM   [phone]

## 2018-03-31 NOTE — Progress Notes (Signed)
Surgical PCR was negative for MRSA, positive for staphylococcus aureus.  Pt has been informed and tx initiated.  Received call from blood bank for his 2 units of RBC ready for him.  Idolina Primer, RN

## 2018-03-31 NOTE — Anesthesia Preprocedure Evaluation (Addendum)
Anesthesia Evaluation  Patient identified by MRN, date of birth, ID bandGeneral Assessment Comment:Slightly sedated after receiving Valium  Reviewed: Allergy & Precautions, H&P , NPO status , Patient's Chart, lab work & pertinent test results, reviewed documented beta blocker date and time   Airway Mallampati: II  TM Distance: >3 FB Neck ROM: Full    Dental  (+) Poor Dentition, Missing, Chipped,    Pulmonary Current Smoker,    Pulmonary exam normal breath sounds clear to auscultation       Cardiovascular Exercise Tolerance: Good hypertension, Pt. on medications and Pt. 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   Endo/Other  diabetes, Type 2, Oral Hypoglycemic Agents  Renal/GU Renal InsufficiencyRenal disease     Musculoskeletal   Abdominal   Peds  Hematology  (+) anemia , HLD   Anesthesia Other Findings   Reproductive/Obstetrics negative OB ROS                            Anesthesia Physical Anesthesia Plan  ASA: IV  Anesthesia Plan: General   Post-op Pain Management:    Induction: Intravenous  PONV Risk Score and Plan: 2 and Treatment may vary due to age or medical condition and Midazolam  Airway Management Planned: Oral ETT  Additional Equipment: Arterial line, CVP, TEE and Ultrasound Guidance Line Placement  Intra-op Plan:   Post-operative Plan: Post-operative intubation/ventilation  Informed Consent: I have reviewed the patients History and Physical, chart, labs and discussed the procedure including the risks, benefits and alternatives for the proposed anesthesia with the patient or authorized representative who  has indicated his/her understanding and acceptance.   Dental advisory given  Plan Discussed with: CRNA  Anesthesia Plan Comments:         Anesthesia Quick Evaluation

## 2018-03-31 NOTE — Progress Notes (Signed)
ANTICOAGULATION CONSULT NOTE - Follow Up Consult  Pharmacy Consult for Heparin Indication: 3VCAD  Allergies  Allergen Reactions  . Flomax [Tamsulosin Hcl]     High dose   . Morphine And Related     Patient Measurements: Height: 5\' 11"  (180.3 cm) Weight: 164 lb 8 oz (74.6 kg) IBW/kg (Calculated) : 75.3 Heparin Dosing Weight:74.6 kg  Vital Signs: Temp: 98.1 F (36.7 C) (12/09 0502) Temp Source: Oral (12/09 0502) BP: 139/67 (12/09 0820) Pulse Rate: 54 (12/09 0820)  Labs: Recent Labs    03/29/18 0354 03/30/18 0411 03/31/18 0406  HGB 11.0* 11.1* 11.2*  HCT 34.3* 33.3* 34.7*  PLT 168 143* 161  HEPARINUNFRC 0.54 0.57 0.64    Estimated Creatinine Clearance: 56.4 mL/min (A) (by C-G formula based on SCr of 1.25 mg/dL (H)).  Assessment: Anticoag: s/p cath 12/5 which showed severe 3v CAD. Plan for CABG 12/10. Pt continues on heparin. Cbc stable. HL 0.63 in goal.   Goal of Therapy:  Heparin level 0.3-0.7 units/ml Monitor platelets by anticoagulation protocol: Yes   Plan:  Continue heparin at 1050 units/hr Monitor daily heparin level and CBC, s/sx bleeding CABG planned for 12/10  Muzamil Harker S. Alford Highland, PharmD, BCPS Clinical Staff Pharmacist Eilene Ghazi Stillinger 03/31/2018,8:36 AM

## 2018-03-31 NOTE — Progress Notes (Signed)
CARDIAC REHAB PHASE I   PRE:  Rate/Rhythm: 47 SB  BP:  Supine: 144/62  Sitting:   Standing:    SaO2: 99%RA  MODE:  Ambulation: 240 ft   POST:  Rate/Rhythm: 76 SR  BP:  Supine: 158/70  Sitting:   Standing:    SaO2: 97%RA 0953-1010 Pt walked 240 ft on RA pushing IV pole with steady gait. Tolerated well with no CP. Back to bed after walk. Will follow up after surgery.   Graylon Good, RN BSN  03/31/2018 10:06 AM

## 2018-04-01 ENCOUNTER — Inpatient Hospital Stay (HOSPITAL_COMMUNITY): Payer: Medicare HMO

## 2018-04-01 ENCOUNTER — Inpatient Hospital Stay (HOSPITAL_COMMUNITY): Payer: Medicare HMO | Admitting: Anesthesiology

## 2018-04-01 ENCOUNTER — Inpatient Hospital Stay (HOSPITAL_COMMUNITY)
Admission: EM | Disposition: A | Discharge status: Home Health | Payer: Self-pay | Source: Home / Self Care | Attending: Cardiothoracic Surgery

## 2018-04-01 ENCOUNTER — Encounter (HOSPITAL_COMMUNITY): Payer: Self-pay | Admitting: Critical Care Medicine

## 2018-04-01 DIAGNOSIS — Z951 Presence of aortocoronary bypass graft: Secondary | ICD-10-CM

## 2018-04-01 DIAGNOSIS — I2510 Atherosclerotic heart disease of native coronary artery without angina pectoris: Secondary | ICD-10-CM

## 2018-04-01 HISTORY — PX: CORONARY ARTERY BYPASS GRAFT: SHX141

## 2018-04-01 HISTORY — PX: TEE WITHOUT CARDIOVERSION: SHX5443

## 2018-04-01 LAB — CBC
HCT: 33.3 % — ABNORMAL LOW (ref 39.0–52.0)
HCT: 22.9 % — ABNORMAL LOW (ref 39.0–52.0)
HCT: 24.5 % — ABNORMAL LOW (ref 39.0–52.0)
Hemoglobin: 10.9 g/dL — ABNORMAL LOW (ref 13.0–17.0)
Hemoglobin: 7.7 g/dL — ABNORMAL LOW (ref 13.0–17.0)
Hemoglobin: 8.4 g/dL — ABNORMAL LOW (ref 13.0–17.0)
MCH: 30.9 pg (ref 26.0–34.0)
MCH: 31 pg (ref 26.0–34.0)
MCH: 31.5 pg (ref 26.0–34.0)
MCHC: 32.7 g/dL (ref 30.0–36.0)
MCHC: 33.6 g/dL (ref 30.0–36.0)
MCHC: 34.3 g/dL (ref 30.0–36.0)
MCV: 94.3 fL (ref 80.0–100.0)
MCV: 92.3 fL (ref 80.0–100.0)
MCV: 91.8 fL (ref 80.0–100.0)
NRBC: 0 % (ref 0.0–0.2)
PLATELETS: 150 10*3/uL (ref 150–400)
Platelets: 50 10*3/uL — ABNORMAL LOW (ref 150–400)
Platelets: 66 10*3/uL — ABNORMAL LOW (ref 150–400)
RBC: 3.53 MIL/uL — ABNORMAL LOW (ref 4.22–5.81)
RBC: 2.48 MIL/uL — ABNORMAL LOW (ref 4.22–5.81)
RBC: 2.67 MIL/uL — ABNORMAL LOW (ref 4.22–5.81)
RDW: 12.1 % (ref 11.5–15.5)
RDW: 13.2 % (ref 11.5–15.5)
RDW: 13.3 % (ref 11.5–15.5)
WBC: 8.8 10*3/uL (ref 4.0–10.5)
WBC: 9.8 10*3/uL (ref 4.0–10.5)
WBC: 11.5 10*3/uL — ABNORMAL HIGH (ref 4.0–10.5)
nRBC: 0 % (ref 0.0–0.2)
nRBC: 0 % (ref 0.0–0.2)

## 2018-04-01 LAB — BASIC METABOLIC PANEL
Anion gap: 9 (ref 5–15)
BUN: 20 mg/dL (ref 8–23)
CO2: 24 mmol/L (ref 22–32)
Calcium: 9 mg/dL (ref 8.9–10.3)
Chloride: 107 mmol/L (ref 98–111)
Creatinine, Ser: 1.39 mg/dL — ABNORMAL HIGH (ref 0.61–1.24)
GFR calc Af Amer: 58 mL/min — ABNORMAL LOW (ref >60)
GFR calc non Af Amer: 50 mL/min — ABNORMAL LOW (ref >60)
Glucose, Bld: 144 mg/dL — ABNORMAL HIGH (ref 70–99)
Potassium: 4.2 mmol/L (ref 3.5–5.1)
Sodium: 140 mmol/L (ref 135–145)

## 2018-04-01 LAB — POCT I-STAT 3, ART BLOOD GAS (G3+)
ACID-BASE DEFICIT: 2 mmol/L (ref 0.0–2.0)
Acid-Base Excess: 3 mmol/L — ABNORMAL HIGH (ref 0.0–2.0)
Acid-Base Excess: 1 mmol/L (ref 0.0–2.0)
Acid-base deficit: 5 mmol/L — ABNORMAL HIGH (ref 0.0–2.0)
Acid-base deficit: 5 mmol/L — ABNORMAL HIGH (ref 0.0–2.0)
BICARBONATE: 21.2 mmol/L (ref 20.0–28.0)
Bicarbonate: 27.6 mmol/L (ref 20.0–28.0)
Bicarbonate: 25.2 mmol/L (ref 20.0–28.0)
Bicarbonate: 22.8 mmol/L (ref 20.0–28.0)
Bicarbonate: 21.1 mmol/L (ref 20.0–28.0)
O2 Saturation: 100 %
O2 Saturation: 100 %
O2 Saturation: 99 %
O2 Saturation: 99 %
O2 Saturation: 99 %
PCO2 ART: 42.4 mmHg (ref 32.0–48.0)
PO2 ART: 130 mmHg — AB (ref 83.0–108.0)
PO2 ART: 137 mmHg — AB (ref 83.0–108.0)
Patient temperature: 37.2
Patient temperature: 37.4
TCO2: 22 mmol/L (ref 22–32)
TCO2: 23 mmol/L (ref 22–32)
TCO2: 24 mmol/L (ref 22–32)
TCO2: 26 mmol/L (ref 22–32)
TCO2: 29 mmol/L (ref 22–32)
pCO2 arterial: 37.9 mmHg (ref 32.0–48.0)
pCO2 arterial: 39.1 mmHg (ref 32.0–48.0)
pCO2 arterial: 39.7 mmHg (ref 32.0–48.0)
pCO2 arterial: 44.5 mmHg (ref 32.0–48.0)
pH, Arterial: 7.288 — ABNORMAL LOW (ref 7.350–7.450)
pH, Arterial: 7.307 — ABNORMAL LOW (ref 7.350–7.450)
pH, Arterial: 7.388 (ref 7.350–7.450)
pH, Arterial: 7.417 (ref 7.350–7.450)
pH, Arterial: 7.45 (ref 7.350–7.450)
pO2, Arterial: 438 mmHg — ABNORMAL HIGH (ref 83.0–108.0)
pO2, Arterial: 500 mmHg — ABNORMAL HIGH (ref 83.0–108.0)
pO2, Arterial: 134 mmHg — ABNORMAL HIGH (ref 83.0–108.0)

## 2018-04-01 LAB — POCT I-STAT, CHEM 8
BUN: 18 mg/dL (ref 8–23)
BUN: 16 mg/dL (ref 8–23)
BUN: 15 mg/dL (ref 8–23)
BUN: 16 mg/dL (ref 8–23)
BUN: 15 mg/dL (ref 8–23)
BUN: 15 mg/dL (ref 8–23)
BUN: 15 mg/dL (ref 8–23)
BUN: 13 mg/dL (ref 8–23)
CALCIUM ION: 0.78 mmol/L — AB (ref 1.15–1.40)
CREATININE: 0.7 mg/dL (ref 0.61–1.24)
Calcium, Ion: 1.25 mmol/L (ref 1.15–1.40)
Calcium, Ion: 1.23 mmol/L (ref 1.15–1.40)
Calcium, Ion: 0.88 mmol/L — CL (ref 1.15–1.40)
Calcium, Ion: 1.01 mmol/L — ABNORMAL LOW (ref 1.15–1.40)
Calcium, Ion: 0.99 mmol/L — ABNORMAL LOW (ref 1.15–1.40)
Calcium, Ion: 1.3 mmol/L (ref 1.15–1.40)
Calcium, Ion: 1.24 mmol/L (ref 1.15–1.40)
Chloride: 107 mmol/L (ref 98–111)
Chloride: 107 mmol/L (ref 98–111)
Chloride: 101 mmol/L (ref 98–111)
Chloride: 102 mmol/L (ref 98–111)
Chloride: 100 mmol/L (ref 98–111)
Chloride: 100 mmol/L (ref 98–111)
Chloride: 105 mmol/L (ref 98–111)
Chloride: 107 mmol/L (ref 98–111)
Creatinine, Ser: 1.1 mg/dL (ref 0.61–1.24)
Creatinine, Ser: 0.9 mg/dL (ref 0.61–1.24)
Creatinine, Ser: 0.8 mg/dL (ref 0.61–1.24)
Creatinine, Ser: 0.8 mg/dL (ref 0.61–1.24)
Creatinine, Ser: 0.8 mg/dL (ref 0.61–1.24)
Creatinine, Ser: 0.7 mg/dL (ref 0.61–1.24)
Creatinine, Ser: 1 mg/dL (ref 0.61–1.24)
Glucose, Bld: 131 mg/dL — ABNORMAL HIGH (ref 70–99)
Glucose, Bld: 123 mg/dL — ABNORMAL HIGH (ref 70–99)
Glucose, Bld: 100 mg/dL — ABNORMAL HIGH (ref 70–99)
Glucose, Bld: 114 mg/dL — ABNORMAL HIGH (ref 70–99)
Glucose, Bld: 160 mg/dL — ABNORMAL HIGH (ref 70–99)
Glucose, Bld: 182 mg/dL — ABNORMAL HIGH (ref 70–99)
Glucose, Bld: 156 mg/dL — ABNORMAL HIGH (ref 70–99)
Glucose, Bld: 129 mg/dL — ABNORMAL HIGH (ref 70–99)
HCT: 30 % — ABNORMAL LOW (ref 39.0–52.0)
HCT: 27 % — ABNORMAL LOW (ref 39.0–52.0)
HCT: 20 % — ABNORMAL LOW (ref 39.0–52.0)
HCT: 24 % — ABNORMAL LOW (ref 39.0–52.0)
HCT: 21 % — ABNORMAL LOW (ref 39.0–52.0)
HCT: 22 % — ABNORMAL LOW (ref 39.0–52.0)
HCT: 22 % — ABNORMAL LOW (ref 39.0–52.0)
HEMATOCRIT: 25 % — AB (ref 39.0–52.0)
HEMOGLOBIN: 7.5 g/dL — AB (ref 13.0–17.0)
Hemoglobin: 10.2 g/dL — ABNORMAL LOW (ref 13.0–17.0)
Hemoglobin: 9.2 g/dL — ABNORMAL LOW (ref 13.0–17.0)
Hemoglobin: 6.8 g/dL — CL (ref 13.0–17.0)
Hemoglobin: 8.2 g/dL — ABNORMAL LOW (ref 13.0–17.0)
Hemoglobin: 7.1 g/dL — ABNORMAL LOW (ref 13.0–17.0)
Hemoglobin: 7.5 g/dL — ABNORMAL LOW (ref 13.0–17.0)
Hemoglobin: 8.5 g/dL — ABNORMAL LOW (ref 13.0–17.0)
Potassium: 4.5 mmol/L (ref 3.5–5.1)
Potassium: 3.9 mmol/L (ref 3.5–5.1)
Potassium: 3.7 mmol/L (ref 3.5–5.1)
Potassium: 4.2 mmol/L (ref 3.5–5.1)
Potassium: 4.1 mmol/L (ref 3.5–5.1)
Potassium: 4.8 mmol/L (ref 3.5–5.1)
Potassium: 4.2 mmol/L (ref 3.5–5.1)
Potassium: 4.3 mmol/L (ref 3.5–5.1)
SODIUM: 138 mmol/L (ref 135–145)
Sodium: 139 mmol/L (ref 135–145)
Sodium: 138 mmol/L (ref 135–145)
Sodium: 140 mmol/L (ref 135–145)
Sodium: 140 mmol/L (ref 135–145)
Sodium: 135 mmol/L (ref 135–145)
Sodium: 135 mmol/L (ref 135–145)
Sodium: 137 mmol/L (ref 135–145)
TCO2: 27 mmol/L (ref 22–32)
TCO2: 24 mmol/L (ref 22–32)
TCO2: 28 mmol/L (ref 22–32)
TCO2: 29 mmol/L (ref 22–32)
TCO2: 28 mmol/L (ref 22–32)
TCO2: 26 mmol/L (ref 22–32)
TCO2: 25 mmol/L (ref 22–32)
TCO2: 24 mmol/L (ref 22–32)

## 2018-04-01 LAB — POCT I-STAT 4, (NA,K, GLUC, HGB,HCT)
Glucose, Bld: 141 mg/dL — ABNORMAL HIGH (ref 70–99)
HCT: 22 % — ABNORMAL LOW (ref 39.0–52.0)
Hemoglobin: 7.5 g/dL — ABNORMAL LOW (ref 13.0–17.0)
Potassium: 4 mmol/L (ref 3.5–5.1)
SODIUM: 140 mmol/L (ref 135–145)

## 2018-04-01 LAB — GLUCOSE, CAPILLARY
GLUCOSE-CAPILLARY: 126 mg/dL — AB (ref 70–99)
Glucose-Capillary: 128 mg/dL — ABNORMAL HIGH (ref 70–99)
Glucose-Capillary: 134 mg/dL — ABNORMAL HIGH (ref 70–99)
Glucose-Capillary: 131 mg/dL — ABNORMAL HIGH (ref 70–99)

## 2018-04-01 LAB — PREPARE RBC (CROSSMATCH)

## 2018-04-01 LAB — CREATININE, SERUM
Creatinine, Ser: 1.18 mg/dL (ref 0.61–1.24)
GFR calc Af Amer: >60 mL/min (ref >60)
GFR calc non Af Amer: >60 mL/min (ref >60)

## 2018-04-01 LAB — PROTIME-INR
INR: 1.76
Prothrombin Time: 20.3 seconds — ABNORMAL HIGH (ref 11.4–15.2)

## 2018-04-01 LAB — APTT
aPTT: 101 seconds — ABNORMAL HIGH (ref 24–36)
aPTT: 39 seconds — ABNORMAL HIGH (ref 24–36)

## 2018-04-01 LAB — PLATELET COUNT: Platelets: 118 10*3/uL — ABNORMAL LOW (ref 150–400)

## 2018-04-01 LAB — HEMOGLOBIN AND HEMATOCRIT, BLOOD
HCT: 22.4 % — ABNORMAL LOW (ref 39.0–52.0)
Hemoglobin: 7.7 g/dL — ABNORMAL LOW (ref 13.0–17.0)

## 2018-04-01 LAB — MAGNESIUM: Magnesium: 3.3 mg/dL — ABNORMAL HIGH (ref 1.7–2.4)

## 2018-04-01 LAB — HEPARIN LEVEL (UNFRACTIONATED): Heparin Unfractionated: 0.2 IU/mL — ABNORMAL LOW (ref 0.30–0.70)

## 2018-04-01 SURGERY — CORONARY ARTERY BYPASS GRAFTING (CABG)
Anesthesia: General | Site: Chest

## 2018-04-01 MED ORDER — NOREPINEPHRINE 4 MG/250ML-% IV SOLN
0.0000 ug/min | INTRAVENOUS | Status: DC
  Administered 2018-04-01: 1 ug/min via INTRAVENOUS

## 2018-04-01 MED ORDER — CALCIUM CHLORIDE 10 % IV SOLN
INTRAVENOUS | Status: DC | PRN
  Administered 2018-04-01 (×2): 100 mg via INTRAVENOUS
  Administered 2018-04-01: 200 mg via INTRAVENOUS

## 2018-04-01 MED ORDER — NITROGLYCERIN 0.2 MG/ML ON CALL CATH LAB
INTRAVENOUS | Status: DC | PRN
  Administered 2018-04-01: 20 ug via INTRAVENOUS

## 2018-04-01 MED ORDER — LEVALBUTEROL HCL 0.63 MG/3ML IN NEBU: 0.6300 mg | INHALATION_SOLUTION | Freq: Four times a day (QID) | RESPIRATORY_TRACT | Status: DC | PRN

## 2018-04-01 MED ORDER — SODIUM CHLORIDE 0.9 % IV SOLN
INTRAVENOUS | Status: DC | PRN
  Administered 2018-04-01: 750 mg via INTRAVENOUS

## 2018-04-01 MED ORDER — CHLORHEXIDINE GLUCONATE 0.12% ORAL RINSE (MEDLINE KIT)
15.0000 mL | Freq: Two times a day (BID) | OROMUCOSAL | Status: DC
  Administered 2018-04-01: 15 mL via OROMUCOSAL

## 2018-04-01 MED ORDER — CHLORHEXIDINE GLUCONATE CLOTH 2 % EX PADS
6.0000 | MEDICATED_PAD | Freq: Every day | CUTANEOUS | Status: DC
  Administered 2018-04-01 – 2018-04-05 (×4): 6 via TOPICAL

## 2018-04-01 MED ORDER — PHENYLEPHRINE 40 MCG/ML (10ML) SYRINGE FOR IV PUSH (FOR BLOOD PRESSURE SUPPORT)
PREFILLED_SYRINGE | INTRAVENOUS | Status: DC | PRN
  Administered 2018-04-01 (×4): 40 ug via INTRAVENOUS

## 2018-04-01 MED ORDER — ALBUMIN HUMAN 5 % IV SOLN
250.0000 mL | INTRAVENOUS | Status: AC | PRN
  Administered 2018-04-01 – 2018-04-02 (×3): 12.5 g via INTRAVENOUS
  Filled 2018-04-01 (×2): qty 250

## 2018-04-01 MED ORDER — SODIUM CHLORIDE 0.9% FLUSH: 3.0000 mL | INTRAVENOUS | Status: DC | PRN

## 2018-04-01 MED ORDER — SODIUM CHLORIDE 0.9 % IV SOLN: INTRAVENOUS | Status: DC

## 2018-04-01 MED ORDER — MIDAZOLAM HCL (PF) 10 MG/2ML IJ SOLN
INTRAMUSCULAR | Status: AC
  Filled 2018-04-01: qty 2

## 2018-04-01 MED ORDER — ASPIRIN 81 MG PO CHEW: 324.0000 mg | CHEWABLE_TABLET | Freq: Every day | ORAL | Status: DC

## 2018-04-01 MED ORDER — METOPROLOL TARTRATE 25 MG/10 ML ORAL SUSPENSION: 12.5000 mg | Freq: Two times a day (BID) | ORAL | Status: DC

## 2018-04-01 MED ORDER — OXYCODONE HCL 5 MG PO TABS
5.0000 mg | ORAL_TABLET | ORAL | Status: DC | PRN
  Administered 2018-04-02 – 2018-04-07 (×9): 5 mg via ORAL
  Filled 2018-04-01 (×9): qty 1

## 2018-04-01 MED ORDER — MAGNESIUM SULFATE 4 GM/100ML IV SOLN
4.0000 g | Freq: Once | INTRAVENOUS | Status: AC
  Administered 2018-04-01: 4 g via INTRAVENOUS
  Filled 2018-04-01: qty 100

## 2018-04-01 MED ORDER — HEPARIN SODIUM (PORCINE) 1000 UNIT/ML IJ SOLN
INTRAMUSCULAR | Status: DC | PRN
  Administered 2018-04-01: 25000 [IU] via INTRAVENOUS
  Administered 2018-04-01: 2000 [IU] via INTRAVENOUS

## 2018-04-01 MED ORDER — LACTATED RINGERS IV SOLN: INTRAVENOUS | Status: DC

## 2018-04-01 MED ORDER — SODIUM CHLORIDE 0.9% FLUSH: 10.0000 mL | INTRAVENOUS | Status: DC | PRN

## 2018-04-01 MED ORDER — PHENYLEPHRINE 40 MCG/ML (10ML) SYRINGE FOR IV PUSH (FOR BLOOD PRESSURE SUPPORT)
PREFILLED_SYRINGE | INTRAVENOUS | Status: AC
  Filled 2018-04-01: qty 10

## 2018-04-01 MED ORDER — FENTANYL CITRATE (PF) 250 MCG/5ML IJ SOLN
INTRAMUSCULAR | Status: DC | PRN
  Administered 2018-04-01 (×2): 250 ug via INTRAVENOUS
  Administered 2018-04-01: 50 ug via INTRAVENOUS
  Administered 2018-04-01: 475 ug via INTRAVENOUS
  Administered 2018-04-01: 250 ug via INTRAVENOUS
  Administered 2018-04-01: 100 ug via INTRAVENOUS
  Administered 2018-04-01: 25 ug via INTRAVENOUS
  Administered 2018-04-01: 100 ug via INTRAVENOUS

## 2018-04-01 MED ORDER — FENTANYL CITRATE (PF) 250 MCG/5ML IJ SOLN
INTRAMUSCULAR | Status: AC
  Filled 2018-04-01: qty 25

## 2018-04-01 MED ORDER — HEPARIN SODIUM (PORCINE) 1000 UNIT/ML IJ SOLN
INTRAMUSCULAR | Status: AC
  Filled 2018-04-01: qty 1

## 2018-04-01 MED ORDER — LACTATED RINGERS IV SOLN
INTRAVENOUS | Status: DC | PRN
  Administered 2018-04-01: 07:00:00 via INTRAVENOUS

## 2018-04-01 MED ORDER — SODIUM CHLORIDE (PF) 0.9 % IJ SOLN
OROMUCOSAL | Status: DC | PRN
  Administered 2018-04-01 (×3): via TOPICAL

## 2018-04-01 MED ORDER — ACETAMINOPHEN 160 MG/5ML PO SOLN: 1000.0000 mg | Freq: Four times a day (QID) | ORAL | Status: DC

## 2018-04-01 MED ORDER — ARTIFICIAL TEARS OPHTHALMIC OINT
TOPICAL_OINTMENT | OPHTHALMIC | Status: DC | PRN
  Administered 2018-04-01: 1 via OPHTHALMIC

## 2018-04-01 MED ORDER — SODIUM CHLORIDE 0.9 % IV SOLN
1.5000 g | Freq: Two times a day (BID) | INTRAVENOUS | Status: AC
  Administered 2018-04-01 – 2018-04-03 (×4): 1.5 g via INTRAVENOUS
  Filled 2018-04-01 (×4): qty 1.5

## 2018-04-01 MED ORDER — BISACODYL 5 MG PO TBEC
10.0000 mg | DELAYED_RELEASE_TABLET | Freq: Every day | ORAL | Status: DC
  Administered 2018-04-02 – 2018-04-08 (×7): 10 mg via ORAL
  Filled 2018-04-01 (×7): qty 2

## 2018-04-01 MED ORDER — MIDAZOLAM HCL 5 MG/5ML IJ SOLN
INTRAMUSCULAR | Status: DC | PRN
  Administered 2018-04-01: 4.5 mg via INTRAVENOUS
  Administered 2018-04-01: 0.5 mg via INTRAVENOUS
  Administered 2018-04-01: 3 mg via INTRAVENOUS

## 2018-04-01 MED ORDER — ACETAMINOPHEN 160 MG/5ML PO SOLN: 650.0000 mg | Freq: Once | ORAL | Status: AC

## 2018-04-01 MED ORDER — ARTIFICIAL TEARS OPHTHALMIC OINT
TOPICAL_OINTMENT | OPHTHALMIC | Status: AC
  Filled 2018-04-01: qty 7

## 2018-04-01 MED ORDER — ACETAMINOPHEN 650 MG RE SUPP
650.0000 mg | Freq: Once | RECTAL | Status: AC
  Administered 2018-04-01: 650 mg via RECTAL

## 2018-04-01 MED ORDER — TRAMADOL HCL 50 MG PO TABS
50.0000 mg | ORAL_TABLET | ORAL | Status: DC | PRN
  Administered 2018-04-02 – 2018-04-09 (×11): 50 mg via ORAL
  Filled 2018-04-01 (×12): qty 1

## 2018-04-01 MED ORDER — VANCOMYCIN HCL IN DEXTROSE 1-5 GM/200ML-% IV SOLN
1000.0000 mg | Freq: Once | INTRAVENOUS | Status: AC
  Administered 2018-04-01: 1000 mg via INTRAVENOUS
  Filled 2018-04-01: qty 200

## 2018-04-01 MED ORDER — HEMOSTATIC AGENTS (NO CHARGE) OPTIME
TOPICAL | Status: DC | PRN
  Administered 2018-04-01 (×5): 1 via TOPICAL

## 2018-04-01 MED ORDER — DEXMEDETOMIDINE HCL IN NACL 200 MCG/50ML IV SOLN: 0.0000 ug/kg/h | INTRAVENOUS | Status: DC

## 2018-04-01 MED ORDER — SODIUM CHLORIDE 0.9 % IV SOLN
20.0000 ug | INTRAVENOUS | Status: AC
  Administered 2018-04-01: 20 ug via INTRAVENOUS
  Filled 2018-04-01: qty 5

## 2018-04-01 MED ORDER — ONDANSETRON HCL 4 MG/2ML IJ SOLN
4.0000 mg | Freq: Four times a day (QID) | INTRAMUSCULAR | Status: DC | PRN
  Administered 2018-04-02: 4 mg via INTRAVENOUS
  Filled 2018-04-01: qty 2

## 2018-04-01 MED ORDER — LACTATED RINGERS IV SOLN: 500.0000 mL | Freq: Once | INTRAVENOUS | Status: DC | PRN

## 2018-04-01 MED ORDER — MIDAZOLAM HCL 2 MG/2ML IJ SOLN: 2.0000 mg | INTRAMUSCULAR | Status: DC | PRN

## 2018-04-01 MED ORDER — METOPROLOL TARTRATE 12.5 MG HALF TABLET
12.5000 mg | ORAL_TABLET | Freq: Two times a day (BID) | ORAL | Status: DC
  Administered 2018-04-02 – 2018-04-03 (×3): 12.5 mg via ORAL
  Filled 2018-04-01 (×3): qty 1

## 2018-04-01 MED ORDER — ASPIRIN EC 325 MG PO TBEC: 325.0000 mg | DELAYED_RELEASE_TABLET | Freq: Every day | ORAL | Status: DC

## 2018-04-01 MED ORDER — ACETAMINOPHEN 500 MG PO TABS
1000.0000 mg | ORAL_TABLET | Freq: Four times a day (QID) | ORAL | Status: AC
  Administered 2018-04-02 – 2018-04-06 (×19): 1000 mg via ORAL
  Filled 2018-04-01 (×20): qty 2

## 2018-04-01 MED ORDER — CHLORHEXIDINE GLUCONATE 0.12 % MT SOLN
15.0000 mL | OROMUCOSAL | Status: AC
  Administered 2018-04-01: 15 mL via OROMUCOSAL

## 2018-04-01 MED ORDER — METOPROLOL TARTRATE 5 MG/5ML IV SOLN: 2.5000 mg | INTRAVENOUS | Status: DC | PRN

## 2018-04-01 MED ORDER — INSULIN REGULAR(HUMAN) IN NACL 100-0.9 UT/100ML-% IV SOLN
INTRAVENOUS | Status: DC
  Administered 2018-04-01: 3.6 [IU]/h via INTRAVENOUS
  Filled 2018-04-01: qty 100

## 2018-04-01 MED ORDER — POTASSIUM CHLORIDE 10 MEQ/50ML IV SOLN: 10.0000 meq | INTRAVENOUS | Status: AC

## 2018-04-01 MED ORDER — SODIUM CHLORIDE (PF) 0.9 % IJ SOLN
INTRAMUSCULAR | Status: AC
  Filled 2018-04-01: qty 30

## 2018-04-01 MED ORDER — SODIUM CHLORIDE 0.45 % IV SOLN
INTRAVENOUS | Status: DC | PRN
  Administered 2018-04-01 (×2): via INTRAVENOUS

## 2018-04-01 MED ORDER — SUCCINYLCHOLINE CHLORIDE 200 MG/10ML IV SOSY
PREFILLED_SYRINGE | INTRAVENOUS | Status: DC | PRN
  Administered 2018-04-01: 100 mg via INTRAVENOUS

## 2018-04-01 MED ORDER — LACTATED RINGERS IV SOLN
INTRAVENOUS | Status: DC
  Administered 2018-04-01 – 2018-04-02 (×2): via INTRAVENOUS

## 2018-04-01 MED ORDER — ROCURONIUM BROMIDE 50 MG/5ML IV SOSY
PREFILLED_SYRINGE | INTRAVENOUS | Status: AC
  Filled 2018-04-01: qty 15

## 2018-04-01 MED ORDER — SODIUM CHLORIDE 0.9% IV SOLUTION
Freq: Once | INTRAVENOUS | Status: AC
  Administered 2018-04-02: 09:00:00 via INTRAVENOUS

## 2018-04-01 MED ORDER — PHENYLEPHRINE HCL-NACL 20-0.9 MG/250ML-% IV SOLN: 0.0000 ug/min | INTRAVENOUS | Status: DC

## 2018-04-01 MED ORDER — ROCURONIUM BROMIDE 10 MG/ML (PF) SYRINGE
PREFILLED_SYRINGE | INTRAVENOUS | Status: DC | PRN
  Administered 2018-04-01 (×5): 50 mg via INTRAVENOUS

## 2018-04-01 MED ORDER — 0.9 % SODIUM CHLORIDE (POUR BTL) OPTIME
TOPICAL | Status: DC | PRN
  Administered 2018-04-01: 6000 mL

## 2018-04-01 MED ORDER — SUCCINYLCHOLINE CHLORIDE 200 MG/10ML IV SOSY
PREFILLED_SYRINGE | INTRAVENOUS | Status: AC
  Filled 2018-04-01: qty 20

## 2018-04-01 MED ORDER — SODIUM CHLORIDE 0.9% FLUSH
3.0000 mL | Freq: Two times a day (BID) | INTRAVENOUS | Status: DC
  Administered 2018-04-02 – 2018-04-08 (×9): 3 mL via INTRAVENOUS

## 2018-04-01 MED ORDER — BISACODYL 10 MG RE SUPP: 10.0000 mg | Freq: Every day | RECTAL | Status: DC

## 2018-04-01 MED ORDER — LACTATED RINGERS IV SOLN
INTRAVENOUS | Status: DC | PRN
  Administered 2018-04-01 (×2): via INTRAVENOUS

## 2018-04-01 MED ORDER — ORAL CARE MOUTH RINSE: 15.0000 mL | OROMUCOSAL | Status: DC

## 2018-04-01 MED ORDER — PROPOFOL 10 MG/ML IV BOLUS
INTRAVENOUS | Status: AC
  Filled 2018-04-01: qty 20

## 2018-04-01 MED ORDER — INSULIN REGULAR BOLUS VIA INFUSION
0.0000 [IU] | Freq: Three times a day (TID) | INTRAVENOUS | Status: DC
  Filled 2018-04-01: qty 10

## 2018-04-01 MED ORDER — ALBUMIN HUMAN 5 % IV SOLN
INTRAVENOUS | Status: DC | PRN
  Administered 2018-04-01 (×4): via INTRAVENOUS

## 2018-04-01 MED ORDER — SODIUM BICARBONATE 8.4 % IV SOLN
25.0000 meq | Freq: Once | INTRAVENOUS | Status: AC
  Administered 2018-04-02: 25 meq via INTRAVENOUS

## 2018-04-01 MED ORDER — NITROGLYCERIN IN D5W 200-5 MCG/ML-% IV SOLN: 0.0000 ug/min | INTRAVENOUS | Status: DC

## 2018-04-01 MED ORDER — SODIUM CHLORIDE 0.9% FLUSH
10.0000 mL | Freq: Two times a day (BID) | INTRAVENOUS | Status: DC
  Administered 2018-04-02: 30 mL
  Administered 2018-04-02 – 2018-04-04 (×3): 10 mL

## 2018-04-01 MED ORDER — SODIUM CHLORIDE 0.9 % IV SOLN: 250.0000 mL | INTRAVENOUS | Status: DC

## 2018-04-01 MED ORDER — PANTOPRAZOLE SODIUM 40 MG PO TBEC
40.0000 mg | DELAYED_RELEASE_TABLET | Freq: Every day | ORAL | Status: DC
  Administered 2018-04-03 – 2018-04-09 (×7): 40 mg via ORAL
  Filled 2018-04-01 (×7): qty 1

## 2018-04-01 MED ORDER — FAMOTIDINE IN NACL 20-0.9 MG/50ML-% IV SOLN
20.0000 mg | Freq: Two times a day (BID) | INTRAVENOUS | Status: DC
  Administered 2018-04-01: 20 mg via INTRAVENOUS

## 2018-04-01 MED ORDER — PLASMA-LYTE 148 IV SOLN
INTRAVENOUS | Status: AC
  Administered 2018-04-01: 500 mL
  Filled 2018-04-01: qty 2.5

## 2018-04-01 MED ORDER — DOCUSATE SODIUM 100 MG PO CAPS
200.0000 mg | ORAL_CAPSULE | Freq: Every day | ORAL | Status: DC
  Administered 2018-04-02 – 2018-04-09 (×8): 200 mg via ORAL
  Filled 2018-04-01 (×8): qty 2

## 2018-04-01 MED ORDER — MILRINONE LACTATE IN DEXTROSE 20-5 MG/100ML-% IV SOLN
0.1250 ug/kg/min | INTRAVENOUS | Status: DC
  Administered 2018-04-01 – 2018-04-02 (×2): 0.25 ug/kg/min via INTRAVENOUS
  Administered 2018-04-03: 0.125 ug/kg/min via INTRAVENOUS
  Filled 2018-04-01 (×2): qty 100

## 2018-04-01 MED ORDER — FENTANYL CITRATE (PF) 250 MCG/5ML IJ SOLN
INTRAMUSCULAR | Status: AC
  Filled 2018-04-01: qty 5

## 2018-04-01 MED ORDER — PROPOFOL 10 MG/ML IV BOLUS
INTRAVENOUS | Status: DC | PRN
  Administered 2018-04-01: 50 mg via INTRAVENOUS

## 2018-04-01 MED ORDER — GLYCOPYRROLATE PF 0.2 MG/ML IJ SOSY
PREFILLED_SYRINGE | INTRAMUSCULAR | Status: DC | PRN
  Administered 2018-04-01 (×2): .1 mg via INTRAVENOUS

## 2018-04-01 MED ORDER — PROTAMINE SULFATE 10 MG/ML IV SOLN
INTRAVENOUS | Status: DC | PRN
  Administered 2018-04-01: 270 mg via INTRAVENOUS

## 2018-04-01 MED FILL — Potassium Chloride Inj 2 mEq/ML: INTRAVENOUS | Qty: 40 | Status: AC

## 2018-04-01 MED FILL — Magnesium Sulfate Inj 50%: INTRAMUSCULAR | Qty: 10 | Status: AC

## 2018-04-01 MED FILL — Heparin Sodium (Porcine) Inj 1000 Unit/ML: INTRAMUSCULAR | Qty: 30 | Status: AC

## 2018-04-01 SURGICAL SUPPLY — 112 items
ADAPTER CARDIO PERF ANTE/RETRO (ADAPTER) ×4 IMPLANT
ADPR PRFSN 84XANTGRD RTRGD (ADAPTER) ×2
BAG DECANTER FOR FLEXI CONT (MISCELLANEOUS) ×4 IMPLANT
BANDAGE ACE 4X5 VEL STRL LF (GAUZE/BANDAGES/DRESSINGS) ×4 IMPLANT
BANDAGE ACE 6X5 VEL STRL LF (GAUZE/BANDAGES/DRESSINGS) ×4 IMPLANT
BASKET HEART  (ORDER IN 25'S) (MISCELLANEOUS) ×1
BASKET HEART (ORDER IN 25'S) (MISCELLANEOUS) ×1
BASKET HEART (ORDER IN 25S) (MISCELLANEOUS) ×2 IMPLANT
BLADE CLIPPER SURG (BLADE) IMPLANT
BLADE STERNUM SYSTEM 6 (BLADE) ×4 IMPLANT
BLADE SURG 11 STRL SS (BLADE) ×2 IMPLANT
BLADE SURG 12 STRL SS (BLADE) ×4 IMPLANT
BNDG GAUZE ELAST 4 BULKY (GAUZE/BANDAGES/DRESSINGS) ×4 IMPLANT
CANISTER SUCT 3000ML PPV (MISCELLANEOUS) ×4 IMPLANT
CANNULA GUNDRY RCSP 15FR (MISCELLANEOUS) ×4 IMPLANT
CATH CPB KIT VANTRIGT (MISCELLANEOUS) ×4 IMPLANT
CATH ROBINSON RED A/P 18FR (CATHETERS) ×12 IMPLANT
CATH THORACIC 36FR RT ANG (CATHETERS) ×4 IMPLANT
CLIP FOGARTY SPRING 6M (CLIP) ×4 IMPLANT
CLIP VESOCCLUDE SM WIDE 24/CT (CLIP) ×4 IMPLANT
COVER WAND RF STERILE (DRAPES) ×4 IMPLANT
CRADLE DONUT ADULT HEAD (MISCELLANEOUS) ×4 IMPLANT
DRAIN CHANNEL 32F RND 10.7 FF (WOUND CARE) ×4 IMPLANT
DRAPE CARDIOVASCULAR INCISE (DRAPES) ×4
DRAPE SLUSH/WARMER DISC (DRAPES) ×4 IMPLANT
DRAPE SRG 135X102X78XABS (DRAPES) ×2 IMPLANT
DRSG AQUACEL AG ADV 3.5X14 (GAUZE/BANDAGES/DRESSINGS) ×4 IMPLANT
ELECT BLADE 4.0 EZ CLEAN MEGAD (MISCELLANEOUS) ×4
ELECT BLADE 6.5 EXT (BLADE) ×4 IMPLANT
ELECT CAUTERY BLADE 6.4 (BLADE) ×4 IMPLANT
ELECT REM PT RETURN 9FT ADLT (ELECTROSURGICAL) ×8
ELECTRODE BLDE 4.0 EZ CLN MEGD (MISCELLANEOUS) ×2 IMPLANT
ELECTRODE REM PT RTRN 9FT ADLT (ELECTROSURGICAL) ×4 IMPLANT
FELT TEFLON 1X6 (MISCELLANEOUS) ×8 IMPLANT
GAUZE SPONGE 4X4 12PLY STRL (GAUZE/BANDAGES/DRESSINGS) ×8 IMPLANT
GAUZE SPONGE 4X4 12PLY STRL LF (GAUZE/BANDAGES/DRESSINGS) ×4 IMPLANT
GLOVE BIO SURGEON STRL SZ 6.5 (GLOVE) ×1 IMPLANT
GLOVE BIO SURGEON STRL SZ7.5 (GLOVE) ×12 IMPLANT
GLOVE BIO SURGEONS STRL SZ 6.5 (GLOVE) ×1
GLOVE BIOGEL PI IND STRL 6.5 (GLOVE) IMPLANT
GLOVE BIOGEL PI INDICATOR 6.5 (GLOVE) ×6
GOWN STRL REUS W/ TWL LRG LVL3 (GOWN DISPOSABLE) ×8 IMPLANT
GOWN STRL REUS W/TWL 2XL LVL3 (GOWN DISPOSABLE) ×4 IMPLANT
GOWN STRL REUS W/TWL LRG LVL3 (GOWN DISPOSABLE) ×36
GRAFT VASC ANGIOGRAFT 61-70 (Tissue) IMPLANT
HEMOSTAT POWDER SURGIFOAM 1G (HEMOSTASIS) ×12 IMPLANT
HEMOSTAT SURGICEL 2X14 (HEMOSTASIS) ×4 IMPLANT
INSERT FOGARTY XLG (MISCELLANEOUS) IMPLANT
KIT BASIN OR (CUSTOM PROCEDURE TRAY) ×4 IMPLANT
KIT SUCTION CATH 14FR (SUCTIONS) ×4 IMPLANT
KIT TURNOVER KIT B (KITS) ×4 IMPLANT
KIT VASOVIEW HEMOPRO 2 VH 4000 (KITS) ×4 IMPLANT
LEAD PACING MYOCARDI (MISCELLANEOUS) ×4 IMPLANT
MARKER GRAFT CORONARY BYPASS (MISCELLANEOUS) ×14 IMPLANT
NS IRRIG 1000ML POUR BTL (IV SOLUTION) ×20 IMPLANT
PACK E OPEN HEART (SUTURE) ×4 IMPLANT
PACK OPEN HEART (CUSTOM PROCEDURE TRAY) ×4 IMPLANT
PAD ARMBOARD 7.5X6 YLW CONV (MISCELLANEOUS) ×8 IMPLANT
PAD ELECT DEFIB RADIOL ZOLL (MISCELLANEOUS) ×4 IMPLANT
PENCIL BUTTON HOLSTER BLD 10FT (ELECTRODE) ×4 IMPLANT
POWDER SURGICEL 3.0 GRAM (HEMOSTASIS) ×6 IMPLANT
PUNCH AORTIC ROT 4.0MM RCL 40 (MISCELLANEOUS) ×2 IMPLANT
PUNCH AORTIC ROTATE 4.0MM (MISCELLANEOUS) IMPLANT
PUNCH AORTIC ROTATE 4.5MM 8IN (MISCELLANEOUS) IMPLANT
PUNCH AORTIC ROTATE 5MM 8IN (MISCELLANEOUS) IMPLANT
SET CARDIOPLEGIA MPS 5001102 (MISCELLANEOUS) ×2 IMPLANT
SPONGE LAP 18X18 RF (DISPOSABLE) ×8 IMPLANT
SPONGE LAP 18X18 X RAY DECT (DISPOSABLE) IMPLANT
SPONGE LAP 4X18 RFD (DISPOSABLE) ×4 IMPLANT
SURGIFLO W/THROMBIN 8M KIT (HEMOSTASIS) ×4 IMPLANT
SUT BONE WAX W31G (SUTURE) ×4 IMPLANT
SUT ETHILON 3 0 FSL (SUTURE) ×2 IMPLANT
SUT MNCRL AB 4-0 PS2 18 (SUTURE) ×8 IMPLANT
SUT PROLENE 3 0 SH DA (SUTURE) IMPLANT
SUT PROLENE 3 0 SH1 36 (SUTURE) IMPLANT
SUT PROLENE 4 0 RB 1 (SUTURE) ×8
SUT PROLENE 4 0 SH DA (SUTURE) ×8 IMPLANT
SUT PROLENE 4-0 RB1 .5 CRCL 36 (SUTURE) ×2 IMPLANT
SUT PROLENE 5 0 C 1 36 (SUTURE) IMPLANT
SUT PROLENE 6 0 C 1 30 (SUTURE) IMPLANT
SUT PROLENE 6 0 CC (SUTURE) ×18 IMPLANT
SUT PROLENE 8 0 BV175 6 (SUTURE) IMPLANT
SUT PROLENE BLUE 7 0 (SUTURE) ×10 IMPLANT
SUT PROLENE POLY MONO (SUTURE) ×6 IMPLANT
SUT SILK  1 MH (SUTURE)
SUT SILK 1 MH (SUTURE) IMPLANT
SUT SILK 2 0 SH CR/8 (SUTURE) ×2 IMPLANT
SUT SILK 3 0 SH CR/8 (SUTURE) IMPLANT
SUT STEEL 6MS V (SUTURE) ×6 IMPLANT
SUT STEEL SZ 6 DBL 3X14 BALL (SUTURE) ×6 IMPLANT
SUT VIC AB 1 CTX 36 (SUTURE) ×8
SUT VIC AB 1 CTX36XBRD ANBCTR (SUTURE) ×4 IMPLANT
SUT VIC AB 2-0 CT1 27 (SUTURE) ×16
SUT VIC AB 2-0 CT1 TAPERPNT 27 (SUTURE) IMPLANT
SUT VIC AB 2-0 CTX 27 (SUTURE) IMPLANT
SUT VIC AB 3-0 X1 27 (SUTURE) IMPLANT
SYR 20CC LL (SYRINGE) ×2 IMPLANT
SYSTEM SAHARA CHEST DRAIN ATS (WOUND CARE) ×4 IMPLANT
TAPE CLOTH SURG 4X10 WHT LF (GAUZE/BANDAGES/DRESSINGS) ×2 IMPLANT
TAPE PAPER 2X10 WHT MICROPORE (GAUZE/BANDAGES/DRESSINGS) ×2 IMPLANT
TOWEL GREEN STERILE (TOWEL DISPOSABLE) ×4 IMPLANT
TOWEL GREEN STERILE FF (TOWEL DISPOSABLE) ×4 IMPLANT
TRAY FOLEY SLVR 16FR TEMP STAT (SET/KITS/TRAYS/PACK) ×4 IMPLANT
TUBE CONNECTING 20'X1/4 (TUBING) ×1
TUBE CONNECTING 20X1/4 (TUBING) ×1 IMPLANT
TUBE SUCT INTRACARD DLP 20F (MISCELLANEOUS) ×2 IMPLANT
TUBING INSUFFLATION (TUBING) ×4 IMPLANT
UNDERPAD 30X30 (UNDERPADS AND DIAPERS) ×4 IMPLANT
VEIN SAPHENOUS CRYO  61-70CM (Tissue) ×2 IMPLANT
VEIN SAPHENOUS CRYO 61-70CM (Tissue) ×2 IMPLANT
WATER STERILE IRR 1000ML POUR (IV SOLUTION) ×8 IMPLANT
YANKAUER SUCT BULB TIP NO VENT (SUCTIONS) ×2 IMPLANT

## 2018-04-01 NOTE — Progress Notes (Signed)
Pre Procedure note for inpatients:   ROBERTS BON has been scheduled for Procedure(s): CORONARY ARTERY BYPASS GRAFTING (CABG) (N/A) TRANSESOPHAGEAL ECHOCARDIOGRAM (TEE) (N/A) today. The various methods of treatment have been discussed with the patient. After consideration of the risks, benefits and treatment options the patient has consented to the planned procedure.   The patient has been seen and labs reviewed. There are no changes in the patient's condition to prevent proceeding with the planned procedure today.  Recent labs:  Lab Results  Component Value Date   WBC 8.8 04/01/2018   HGB 10.9 (L) 04/01/2018   HCT 33.3 (L) 04/01/2018   PLT 150 04/01/2018   GLUCOSE 144 (H) 04/01/2018   CHOL 154 03/28/2018   TRIG 67 03/28/2018   HDL 36 (L) 03/28/2018   LDLCALC 105 (H) 03/28/2018   ALT 22 09/03/2017   AST 17 09/03/2017   NA 140 04/01/2018   K 4.2 04/01/2018   CL 107 04/01/2018   CREATININE 1.39 (H) 04/01/2018   BUN 20 04/01/2018   CO2 24 04/01/2018   TSH 1.707 03/28/2018   INR 1.13 03/28/2018   HGBA1C 6.9 (H) 03/26/2018    Len Childs, MD 04/01/2018 7:51 AM

## 2018-04-01 NOTE — Progress Notes (Signed)
  Rapid wean protocol initated.

## 2018-04-01 NOTE — Anesthesia Procedure Notes (Addendum)
Arterial Line Insertion Start/End12/01/2018 6:40 AM, 04/01/2018 6:45 AM Performed by: Wilburn Cornelia, CRNA  Patient location: Pre-op. Preanesthetic checklist: patient identified, IV checked, site marked, risks and benefits discussed, surgical consent, monitors and equipment checked, pre-op evaluation, timeout performed and anesthesia consent Lidocaine 1% used for infiltration Left, radial was placed Catheter size: 20 G Hand hygiene performed  and maximum sterile barriers used  Allen's test indicative of satisfactory collateral circulation Attempts: 1 Procedure performed without using ultrasound guided technique. Following insertion, Biopatch and dressing applied. Post procedure assessment: normal  Patient tolerated the procedure well with no immediate complications.

## 2018-04-01 NOTE — Progress Notes (Signed)
Echocardiogram Echocardiogram Transesophageal has been performed.  [name] 04/01/2018, 8:39 AM

## 2018-04-01 NOTE — Procedures (Signed)
Extubation Procedure Note  Patient Details:   Name: [name] DOB: Aug 15, 1945 MRN: [id number]   Airway Documentation:    Vent end date: 04/01/18 Vent end time: 2235   Evaluation  O2 sats: stable throughout Complications: No apparent complications Patient did tolerate procedure well. Bilateral Breath Sounds: Clear, Diminished   Patient extubated to 4lpm [name].  NIF -35   VC 1.2L  Cuff leak noted prior to extubation.  Patient able to vocalize post extubation.  Catha Brow 04/01/2018, 10:43 PM

## 2018-04-01 NOTE — Anesthesia Procedure Notes (Signed)
Central Venous Catheter Insertion Performed by: Roderic Palau, MD, anesthesiologist Start/End12/01/2018 6:40 AM, 04/01/2018 6:55 AM Patient location: Pre-op. Preanesthetic checklist: patient identified, IV checked, site marked, risks and benefits discussed, surgical consent, monitors and equipment checked, pre-op evaluation, timeout performed and anesthesia consent Hand hygiene performed  and maximum sterile barriers used  PA cath was placed.Swan type:thermodilution PA Cath depth:50 Procedure performed without using ultrasound guided technique. Attempts: 1 Patient tolerated the procedure well with no immediate complications.

## 2018-04-01 NOTE — Brief Op Note (Addendum)
03/26/2018 - 04/01/2018  1:11 PM  PATIENT:  [name]  72 y.o. male  PRE-OPERATIVE DIAGNOSIS:  CAD  POST-OPERATIVE DIAGNOSIS:  CAD  PROCEDURE:  TRANSESOPHAGEAL ECHOCARDIOGRAM (TEE)MEDIAN STERNOTOMY for CORONARY ARTERY BYPASS GRAFTING (CABG) x 4 (LIMA to LAD, CRYO VEIN to OM1, SVG to OM2, and SVG to PDA) with PARTIAL EVH/OPEN from RIGHT and LEFT GREATER SAPHENOUS VEIN and LEFT INTERNAL MAMMARY ARTERY  SURGEON:  Surgeon(s) and Role:    [name], MD - Primary  PHYSICIAN ASSISTANT: Lars Pinks PA-C  ASSISTANTS: Dineen Kid RNFA  ANESTHESIA:   general  EBL: 500 mL  BLOOD ADMINISTERED: 3 unit packed cells, 2 unit fresh frozen plasma  DRAINS: Chest tubes placed in the mediastinal and pleural spaces   COUNT CORRET: YES  DICTATION: .Dragon Dictation  PLAN OF CARE: Admit to inpatient   PATIENT DISPOSITION:  ICU - intubated and hemodynamically stable.   Delay start of Pharmacological VTE agent (>24hrs) due to surgical blood loss or risk of bleeding: yes  BASELINE WEIGHT: 74.3 kg

## 2018-04-01 NOTE — Anesthesia Procedure Notes (Signed)
Procedure Name: Intubation Date/Time: 04/01/2018 8:07 AM Performed by: Wilburn Cornelia, CRNA Pre-anesthesia Checklist: Patient identified, Emergency Drugs available, Suction available, Patient being monitored and Timeout performed Patient Re-evaluated:Patient Re-evaluated prior to induction Oxygen Delivery Method: Circle system utilized Preoxygenation: Pre-oxygenation with 100% oxygen Induction Type: IV induction Ventilation: Mask ventilation without difficulty Laryngoscope Size: Mac and 4 Grade View: Grade II Tube type: Oral Tube size: 8.0 mm Number of attempts: 1 Airway Equipment and Method: Stylet Placement Confirmation: positive ETCO2,  ETT inserted through vocal cords under direct vision,  CO2 detector and breath sounds checked- equal and bilateral Secured at: 23 cm Tube secured with: Tape Dental Injury: Teeth and Oropharynx as per pre-operative assessment

## 2018-04-01 NOTE — Progress Notes (Signed)
Patient ID: [name], male   DOB: 09-22-1945, 72 y.o.   MRN: [id number] EVENING ROUNDS NOTE :     [address]       [place] [address]             [phone]                 Day of Surgery Procedure(s) (LRB): CORONARY ARTERY BYPASS GRAFTING (CABG) TIMES FOUR: (LIMA to LAD, CRYO VEIN to OM1, SVG to OM2, and SVG to PDA) with PARTIAL EVH/OPEN from RIGHT and LEFT GREATER SAPHENOUS VEIN and LEFT INTERNAL MAMMARY ARTERY (N/A) TRANSESOPHAGEAL ECHOCARDIOGRAM (TEE) (N/A)  Total Length of Stay:  LOS: 4 days  BP 92/62   Pulse 62   Temp (!) 96.3 F (35.7 C)   Resp 14   Ht 5\' 11"  (1.803 m)   Wt 74.3 kg   SpO2 100%   BMI 22.83 kg/m   .Intake/Output      12/09 0701 - 12/10 0700 12/10 0701 - 12/11 0700   P.O. 1044    I.V. (mL/kg) 2124.1 (28.6) 2200 (29.6)   Blood  1047   IV Piggyback  1300   Total Intake(mL/kg) 3168.1 (42.6) 4547 (61.2)   Urine (mL/kg/hr) 1325 (0.7) 2505 (3.5)   Emesis/NG output  100   Stool 0    Total Output 1325 2605   Net +1843.1 +1942        Urine Occurrence 1 x    Stool Occurrence 1 x      . sodium chloride 20 mL/hr at 04/01/18 1628  . [START ON 04/02/2018] sodium chloride    . sodium chloride    . albumin human 12.5 g (04/01/18 1608)  . cefUROXime (ZINACEF)  IV    . dexmedetomidine (PRECEDEX) IV infusion 0.5 mcg/kg/hr (04/01/18 1601)  . famotidine (PEPCID) IV 20 mg (04/01/18 1559)  . insulin 4.1 Units/hr (04/01/18 1602)  . lactated ringers    . lactated ringers    . lactated ringers 20 mL/hr at 04/01/18 1605  . magnesium sulfate 4 g (04/01/18 1555)  . milrinone 0.25 mcg/kg/min (04/01/18 1603)  . nitroGLYCERIN    . norepinephrine (LEVOPHED) Adult infusion 1 mcg/min (04/01/18 1627)  . phenylephrine (NEO-SYNEPHRINE) Adult infusion    . potassium chloride    . vancomycin       Lab Results  Component Value Date   WBC 9.8 04/01/2018   HGB 7.5 (L) 04/01/2018   HCT 22.0 (L) 04/01/2018   PLT 50 (L) 04/01/2018   GLUCOSE  141 (H) 04/01/2018   CHOL 154 03/28/2018   TRIG 67 03/28/2018   HDL 36 (L) 03/28/2018   LDLCALC 105 (H) 03/28/2018   ALT 22 09/03/2017   AST 17 09/03/2017   NA 140 04/01/2018   K 4.0 04/01/2018   CL 105 04/01/2018   CREATININE 0.70 04/01/2018   BUN 15 04/01/2018   CO2 24 04/01/2018   TSH 1.707 03/28/2018   INR 1.76 04/01/2018   HGBA1C 6.9 (H) 03/26/2018   CABG today Early post op ci 2.0 on on levaphed and milrinone  hgb 7.7 to get prbcs per pvt    Grace Isaac MD  Beeper [phone] Office [phone] 04/01/2018 4:38 PM

## 2018-04-01 NOTE — Transfer of Care (Signed)
Immediate Anesthesia Transfer of Care Note  Patient: [name]  Procedure(s) Performed: CORONARY ARTERY BYPASS GRAFTING (CABG) TIMES FOUR: (LIMA to LAD, CRYO VEIN to OM1, SVG to OM2, and SVG to PDA) with PARTIAL EVH/OPEN from RIGHT and LEFT GREATER SAPHENOUS VEIN and LEFT INTERNAL MAMMARY ARTERY (N/A Chest) TRANSESOPHAGEAL ECHOCARDIOGRAM (TEE) (N/A )  Patient Location: SICU  Anesthesia Type:General  Level of Consciousness: Patient remains intubated per anesthesia plan  Airway & Oxygen Therapy: Patient remains intubated per anesthesia plan and Patient placed on Ventilator (see vital sign flow sheet for setting)  Post-op Assessment: Report given to RN and Post -op Vital signs reviewed and stable  Post vital signs: Reviewed and stable  Last Vitals:  Vitals Value Taken Time  BP 132/74 04/01/2018  3:33 PM  Temp    Pulse    Resp 18 04/01/2018  3:35 PM  SpO2 100 % 04/01/2018  3:35 PM  Vitals shown include unvalidated device data.  Last Pain:  Vitals:   04/01/18 0513  TempSrc: Oral  PainSc:       Patients Stated Pain Goal: 0 ([id number])  Complications: No apparent anesthesia complications

## 2018-04-01 NOTE — Op Note (Signed)
NAME: [name], [name] MEDICAL RECORD [id number] ACCOUNT [id number] DATE OF BIRTH:11/02/1945 FACILITY: [facility] LOCATION: [facility] PHYSICIAN:PETER VAN TRIGT III, MD  OPERATIVE REPORT  DATE OF PROCEDURE:  04/01/2018  OPERATION: 1.  Coronary artery bypass grafting x4 (left internal mammary artery to left anterior descending, saphenous vein graft to posterior descending, saphenous vein graft to OM2, cryopreserved saphenous vein graft to OM1). 2.  Endoscopic harvest of bilateral greater saphenous vein. 3.  Use of reconstituted human allograft cryopreserved, reconstituted saphenous vein due to lack of native vein conduit that was acceptable for use.  PREOPERATIVE DIAGNOSIS:  Class IV unstable angina, severe 3-vessel coronary artery disease.  POSTOPERATIVE DIAGNOSIS:  Class IV unstable angina, severe 3-vessel coronary artery disease.  SURGEON:  Len Childs, MD  ASSISTANT:  Lars Pinks, PA-C  ANESTHESIA:  General by Dr. ____ Ola Spurr  PROCEDURE IN DETAIL:  After informed consent was documented in the preop area and final questions addressed, the patient was brought back to the operating room.  The patient had previously been seen in consultation and visited on several occasions to  discuss the procedure of CABG for treatment of his severe CAD.  We discussed the expected benefits of the procedure to include improved survival and preservation of LV function and improvement of symptoms of angina.  We discussed the potential risks to  include the risk of stroke, bleeding, blood transfusion, infection, postoperative pulmonary problems including pleural effusion, postoperative organ failure, postoperative infection, and death.  The patient understood these issues and agreed to proceed  with surgery.  The patient was placed supine on the operating table, and general anesthesia was achieved under invasive hemodynamic monitoring.  The chest, abdomen and legs were prepped with  Betadine and draped as a sterile field.  A proper time-out was performed.  A  transesophageal echo probe had been placed by the anesthesia team.  A sternal incision was made as the saphenous vein was harvested endoscopically first from the right leg and then the left leg.  There was considerable difficulty in obtaining adequate  vein conduit in this patient.  The vein that was removed from the left leg had long segments of small friable vein wall and required multiple repair sutures.  One usable segment of vein was retrieved from the right leg.  On the left leg, the vein was  scoped and harvested, but again it was very small, friable, and only 1 segment of vein could be used as a conduit.  For that reason, the on-site cryopreserved human donor vein was prepared according to protocol for one of the bypass grafts placed to the  OM1.  The internal mammary artery was harvested as a pedicle graft after the sternotomy and was a 1.4 mm vessel with good flow.  The sternal retractor was placed, and the pericardium was opened and suspended.  Pursestrings were placed in the ascending aorta  and right atrium.  The patient was heparinized and then cannulated and placed on bypass.  The coronaries were identified for grafting.  The circumflex vessels were heavily diseased.  The mammary artery and vein grafts were prepared for the distal  anastomoses, and cardioplegia cannulas were placed both antegrade and retrograde cold blood cardioplegia.  The patient was cooled to 32 degrees, and the aortic cross-clamp was applied.  One liter of cold blood cardioplegia was delivered in split doses  between the antegrade aortic and retrograde coronary sinus catheters.  There was good cardioplegic arrest, and supple temperature dropped less than 12 degrees.  Cardioplegia was delivered every 20 minutes while the cross-clamp was in place.  The distal coronary anastomoses were performed.  The first distal anastomosis was the posterior  descending.  A native segment of vein slightly small was sewn end-to-side with running 7-0 Prolene with good flow through the graft.  Cardioplegia was  redosed.  The second distal anastomosis was using one of the patient's native veins to the OM2.   This was a 1.5 mm vessel, proximal 90% stenosis.  Reverse saphenous vein was sewn end-to-side with running 7-0 Prolene with good flow through the graft.  Cardioplegia was redosed.  The third distal anastomosis was the OM1.  This was heavily calcified proximally with a 90% stenosis.  The anastomosis was placed distally using a segment of cryopreserved vein and an end-to-side anastomosis with running 7-0 Prolene.  There was good flow  through the graft.  Cardioplegia was redosed.  The fourth and final distal anastomosis was then placed into the distal third LAD.  It is a 1.5 mm vessel, proximal 95% stenosis.  The left IMA pedicle was brought through an opening in the left lateral pericardium and was brought down onto the LAD and  sewn end-to-side with running 8-0 Prolene.  There was good flow through the anastomosis after briefly releasing the pedicle bulldog on the mammary artery.  The bulldog was reapplied, and the pedicle was secured to the epicardium with 6-0 Prolenes.   Cardioplegia was redosed.  The cross-clamp was still in place.  Three proximal vein anastomoses were performed on the ascending aorta using a 4.0 mm punch and running 6-0 Prolene.  Prior to tying down the final proximal anastomosis, air was vented from the coronaries with a dose  of retrograde warm blood cardioplegia.  The cross-clamp was removed.  The heart resumed a spontaneous rhythm.  The vein grafts were deaired and opened.  Each vein had good flow, and hemostasis was documented at the proximal and distal sites.  The patient was rewarmed and reperfused.  Temporary pacing wires were applied.   The lungs reexpanded.  Ventilator was resumed.  The patient was weaned from  cardiopulmonary bypass without difficulty on low-dose inotropes.  Echo showed preserved LV systolic function.  Hemodynamics were satisfactory with a cardiac index of 2.2.   Protamine was administered, and the cannula was removed.  The mediastinum was irrigated.  The patient had required blood transfusion during cardiopulmonary bypass for hemoglobin dropping into 6.5 g range.  There was considerable blood loss from the leg  incisions.  The patient had a coagulopathy after reversal of heparin with protamine and was given 2 FFPs with improvement in coagulation function.  Obtaining hemostasis of the leg incisions was tedious and time consuming but was achieved, and a drain was  placed in the right leg tunnel.  The superior pericardium was closed over the aorta and vein grafts, and anterior mediastinal and a left pleural chest tubes were placed in the chest.  The sternum was reapproximated with interrupted wire.  The pectoralis  fascia was closed with a running #1 Vicryl.  The subcutaneous and skin layers were closed using running Vicryl, and sterile dressings were applied.  Total cardiopulmonary bypass time was 155 minutes.  The patient returned to the ICU in stable condition.  LN/NUANCE  D:04/01/2018 T:04/01/2018 JOB:[id number]

## 2018-04-01 NOTE — Anesthesia Procedure Notes (Addendum)
Central Venous Catheter Insertion Performed by: Roderic Palau, MD, anesthesiologist Start/End12/01/2018 6:40 AM, 04/01/2018 6:55 AM Patient location: Pre-op. Preanesthetic checklist: patient identified, IV checked, site marked, risks and benefits discussed, surgical consent, monitors and equipment checked, pre-op evaluation, timeout performed and anesthesia consent Position: Trendelenburg Lidocaine 1% used for infiltration and patient sedated Hand hygiene performed , maximum sterile barriers used  and Seldinger technique used Catheter size: 9 Fr Total catheter length 10. Central line was placed.MAC introducer Procedure performed using ultrasound guided technique. Ultrasound Notes:anatomy identified, needle tip was noted to be adjacent to the nerve/plexus identified, no ultrasound evidence of intravascular and/or intraneural injection and image(s) printed for medical record Attempts: 1 Following insertion, line sutured, dressing applied and Biopatch. Post procedure assessment: blood return through all ports, free fluid flow and no air  Patient tolerated the procedure well with no immediate complications.

## 2018-04-02 ENCOUNTER — Inpatient Hospital Stay (HOSPITAL_COMMUNITY): Payer: Medicare HMO

## 2018-04-02 ENCOUNTER — Encounter (HOSPITAL_COMMUNITY): Payer: Self-pay | Admitting: Cardiothoracic Surgery

## 2018-04-02 LAB — PREPARE FRESH FROZEN PLASMA

## 2018-04-02 LAB — BPAM FFP
Blood Product Expiration Date: 201912152359
Blood Product Expiration Date: 201912152359
ISSUE DATE / TIME: 201912101207
ISSUE DATE / TIME: 201912101207
Unit Type and Rh: 6200
Unit Type and Rh: 6200

## 2018-04-02 LAB — POCT I-STAT 3, ART BLOOD GAS (G3+)
Bicarbonate: 24.1 mmol/L (ref 20.0–28.0)
O2 SAT: 100 %
TCO2: 25 mmol/L (ref 22–32)
pCO2 arterial: 37.5 mmHg (ref 32.0–48.0)
pH, Arterial: 7.417 (ref 7.350–7.450)
pO2, Arterial: 485 mmHg — ABNORMAL HIGH (ref 83.0–108.0)

## 2018-04-02 LAB — COOXEMETRY PANEL
Carboxyhemoglobin: 1.3 % (ref 0.5–1.5)
Methemoglobin: 1.8 % — ABNORMAL HIGH (ref 0.0–1.5)
O2 Saturation: 57.2 %
Total hemoglobin: 6.9 g/dL — CL (ref 12.0–16.0)

## 2018-04-02 LAB — CREATININE, SERUM
Creatinine, Ser: 1.44 mg/dL — ABNORMAL HIGH (ref 0.61–1.24)
GFR calc Af Amer: 56 mL/min — ABNORMAL LOW (ref >60)
GFR calc non Af Amer: 48 mL/min — ABNORMAL LOW (ref >60)

## 2018-04-02 LAB — POCT I-STAT, CHEM 8
BUN: 15 mg/dL (ref 8–23)
BUN: 17 mg/dL (ref 8–23)
CHLORIDE: 101 mmol/L (ref 98–111)
CREATININE: 0.8 mg/dL (ref 0.61–1.24)
Calcium, Ion: 1.08 mmol/L — ABNORMAL LOW (ref 1.15–1.40)
Calcium, Ion: 1.17 mmol/L (ref 1.15–1.40)
Chloride: 104 mmol/L (ref 98–111)
Creatinine, Ser: 1.3 mg/dL — ABNORMAL HIGH (ref 0.61–1.24)
GLUCOSE: 160 mg/dL — AB (ref 70–99)
Glucose, Bld: 135 mg/dL — ABNORMAL HIGH (ref 70–99)
HCT: 20 % — ABNORMAL LOW (ref 39.0–52.0)
HCT: 25 % — ABNORMAL LOW (ref 39.0–52.0)
Hemoglobin: 6.8 g/dL — CL (ref 13.0–17.0)
Hemoglobin: 8.5 g/dL — ABNORMAL LOW (ref 13.0–17.0)
Potassium: 4.4 mmol/L (ref 3.5–5.1)
Potassium: 4 mmol/L (ref 3.5–5.1)
SODIUM: 138 mmol/L (ref 135–145)
Sodium: 136 mmol/L (ref 135–145)
TCO2: 27 mmol/L (ref 22–32)
TCO2: 22 mmol/L (ref 22–32)

## 2018-04-02 LAB — BASIC METABOLIC PANEL
Anion gap: 5 (ref 5–15)
BUN: 14 mg/dL (ref 8–23)
CO2: 24 mmol/L (ref 22–32)
Calcium: 8.2 mg/dL — ABNORMAL LOW (ref 8.9–10.3)
Chloride: 110 mmol/L (ref 98–111)
Creatinine, Ser: 1.3 mg/dL — ABNORMAL HIGH (ref 0.61–1.24)
GFR calc Af Amer: >60 mL/min (ref >60)
GFR calc non Af Amer: 55 mL/min — ABNORMAL LOW (ref >60)
Glucose, Bld: 124 mg/dL — ABNORMAL HIGH (ref 70–99)
Potassium: 4 mmol/L (ref 3.5–5.1)
Sodium: 139 mmol/L (ref 135–145)

## 2018-04-02 LAB — GLUCOSE, CAPILLARY
GLUCOSE-CAPILLARY: 105 mg/dL — AB (ref 70–99)
GLUCOSE-CAPILLARY: 104 mg/dL — AB (ref 70–99)
GLUCOSE-CAPILLARY: 102 mg/dL — AB (ref 70–99)
GLUCOSE-CAPILLARY: 124 mg/dL — AB (ref 70–99)
Glucose-Capillary: 118 mg/dL — ABNORMAL HIGH (ref 70–99)
Glucose-Capillary: 122 mg/dL — ABNORMAL HIGH (ref 70–99)
Glucose-Capillary: 126 mg/dL — ABNORMAL HIGH (ref 70–99)
Glucose-Capillary: 127 mg/dL — ABNORMAL HIGH (ref 70–99)
Glucose-Capillary: 141 mg/dL — ABNORMAL HIGH (ref 70–99)
Glucose-Capillary: 148 mg/dL — ABNORMAL HIGH (ref 70–99)
Glucose-Capillary: 158 mg/dL — ABNORMAL HIGH (ref 70–99)
Glucose-Capillary: 126 mg/dL — ABNORMAL HIGH (ref 70–99)
Glucose-Capillary: 120 mg/dL — ABNORMAL HIGH (ref 70–99)
Glucose-Capillary: 108 mg/dL — ABNORMAL HIGH (ref 70–99)
Glucose-Capillary: 115 mg/dL — ABNORMAL HIGH (ref 70–99)
Glucose-Capillary: 119 mg/dL — ABNORMAL HIGH (ref 70–99)
Glucose-Capillary: 146 mg/dL — ABNORMAL HIGH (ref 70–99)
Glucose-Capillary: 88 mg/dL (ref 70–99)
Glucose-Capillary: 84 mg/dL (ref 70–99)
Glucose-Capillary: 167 mg/dL — ABNORMAL HIGH (ref 70–99)
Glucose-Capillary: 118 mg/dL — ABNORMAL HIGH (ref 70–99)

## 2018-04-02 LAB — CBC
HCT: 22.6 % — ABNORMAL LOW (ref 39.0–52.0)
HCT: 26.1 % — ABNORMAL LOW (ref 39.0–52.0)
Hemoglobin: 7.6 g/dL — ABNORMAL LOW (ref 13.0–17.0)
Hemoglobin: 8.8 g/dL — ABNORMAL LOW (ref 13.0–17.0)
MCH: 30.8 pg (ref 26.0–34.0)
MCH: 29.8 pg (ref 26.0–34.0)
MCHC: 33.6 g/dL (ref 30.0–36.0)
MCHC: 33.7 g/dL (ref 30.0–36.0)
MCV: 91.5 fL (ref 80.0–100.0)
MCV: 88.5 fL (ref 80.0–100.0)
Platelets: 65 10*3/uL — ABNORMAL LOW (ref 150–400)
Platelets: 64 10*3/uL — ABNORMAL LOW (ref 150–400)
RBC: 2.47 MIL/uL — ABNORMAL LOW (ref 4.22–5.81)
RBC: 2.95 MIL/uL — ABNORMAL LOW (ref 4.22–5.81)
RDW: 13.3 % (ref 11.5–15.5)
RDW: 15.1 % (ref 11.5–15.5)
WBC: 10.6 10*3/uL — AB (ref 4.0–10.5)
WBC: 12.7 10*3/uL — ABNORMAL HIGH (ref 4.0–10.5)
nRBC: 0 % (ref 0.0–0.2)
nRBC: 0 % (ref 0.0–0.2)

## 2018-04-02 LAB — MAGNESIUM
MAGNESIUM: 2.7 mg/dL — AB (ref 1.7–2.4)
Magnesium: 2.5 mg/dL — ABNORMAL HIGH (ref 1.7–2.4)

## 2018-04-02 LAB — PREPARE RBC (CROSSMATCH)

## 2018-04-02 MED ORDER — FUROSEMIDE 10 MG/ML IJ SOLN
20.0000 mg | Freq: Two times a day (BID) | INTRAMUSCULAR | Status: DC
  Administered 2018-04-02 (×2): 20 mg via INTRAVENOUS
  Filled 2018-04-02 (×2): qty 2

## 2018-04-02 MED ORDER — VANCOMYCIN HCL IN DEXTROSE 1-5 GM/200ML-% IV SOLN
1000.0000 mg | Freq: Once | INTRAVENOUS | Status: AC
  Administered 2018-04-02: 1000 mg via INTRAVENOUS
  Filled 2018-04-02: qty 200

## 2018-04-02 MED ORDER — INSULIN ASPART 100 UNIT/ML ~~LOC~~ SOLN
0.0000 [IU] | SUBCUTANEOUS | Status: DC
  Administered 2018-04-02: 4 [IU] via SUBCUTANEOUS
  Administered 2018-04-02 – 2018-04-03 (×3): 2 [IU] via SUBCUTANEOUS
  Administered 2018-04-03 (×2): 4 [IU] via SUBCUTANEOUS
  Administered 2018-04-03 – 2018-04-04 (×2): 2 [IU] via SUBCUTANEOUS
  Administered 2018-04-04 (×2): 4 [IU] via SUBCUTANEOUS
  Administered 2018-04-05 (×2): 2 [IU] via SUBCUTANEOUS

## 2018-04-02 MED ORDER — INSULIN ASPART 100 UNIT/ML ~~LOC~~ SOLN
3.0000 [IU] | Freq: Three times a day (TID) | SUBCUTANEOUS | Status: DC
  Administered 2018-04-03 – 2018-04-09 (×13): 3 [IU] via SUBCUTANEOUS

## 2018-04-02 MED ORDER — FUROSEMIDE 10 MG/ML IJ SOLN: 20.0000 mg | Freq: Two times a day (BID) | INTRAMUSCULAR | Status: DC

## 2018-04-02 MED ORDER — SODIUM CHLORIDE 0.9 % IV SOLN
INTRAVENOUS | Status: DC | PRN
  Administered 2018-04-02: 250 mL via INTRAVENOUS

## 2018-04-02 MED ORDER — INSULIN DETEMIR 100 UNIT/ML ~~LOC~~ SOLN
12.0000 [IU] | Freq: Two times a day (BID) | SUBCUTANEOUS | Status: DC
  Administered 2018-04-02: 12 [IU] via SUBCUTANEOUS
  Filled 2018-04-02 (×2): qty 0.12

## 2018-04-02 MED ORDER — FUROSEMIDE 10 MG/ML IJ SOLN
40.0000 mg | Freq: Two times a day (BID) | INTRAMUSCULAR | Status: DC
  Administered 2018-04-03 (×2): 40 mg via INTRAVENOUS
  Filled 2018-04-02 (×2): qty 4

## 2018-04-02 MED ORDER — INSULIN DETEMIR 100 UNIT/ML ~~LOC~~ SOLN: 14.0000 [IU] | Freq: Two times a day (BID) | SUBCUTANEOUS | Status: DC

## 2018-04-02 MED ORDER — INSULIN DETEMIR 100 UNIT/ML ~~LOC~~ SOLN
12.0000 [IU] | Freq: Every day | SUBCUTANEOUS | Status: DC
  Administered 2018-04-03 – 2018-04-09 (×7): 12 [IU] via SUBCUTANEOUS
  Filled 2018-04-02 (×7): qty 0.12

## 2018-04-02 MED ORDER — METOCLOPRAMIDE HCL 5 MG/ML IJ SOLN
10.0000 mg | Freq: Four times a day (QID) | INTRAMUSCULAR | Status: AC
  Administered 2018-04-02 – 2018-04-04 (×10): 10 mg via INTRAVENOUS
  Filled 2018-04-02 (×10): qty 2

## 2018-04-02 MED FILL — Sodium Bicarbonate IV Soln 8.4%: INTRAVENOUS | Qty: 50 | Status: AC

## 2018-04-02 MED FILL — Mannitol IV Soln 20%: INTRAVENOUS | Qty: 500 | Status: AC

## 2018-04-02 MED FILL — Lidocaine HCl(Cardiac) IV PF Soln Pref Syr 100 MG/5ML (2%): INTRAVENOUS | Qty: 5 | Status: AC

## 2018-04-02 MED FILL — Sodium Chloride IV Soln 0.9%: INTRAVENOUS | Qty: 2000 | Status: AC

## 2018-04-02 MED FILL — Electrolyte-R (PH 7.4) Solution: INTRAVENOUS | Qty: 5000 | Status: AC

## 2018-04-02 MED FILL — Heparin Sodium (Porcine) Inj 1000 Unit/ML: INTRAMUSCULAR | Qty: 10 | Status: AC

## 2018-04-02 MED FILL — Calcium Chloride Inj 10%: INTRAVENOUS | Qty: 10 | Status: AC

## 2018-04-02 MED FILL — Albumin, Human Inj 5%: INTRAVENOUS | Qty: 250 | Status: AC

## 2018-04-02 NOTE — Progress Notes (Signed)
Called repeat ABG to on-call physician. Verbal order received. See new orders.

## 2018-04-02 NOTE — Progress Notes (Addendum)
[facility] DAILY ICU PROGRESS NOTE                   [address]            [place] [address]          [phone]   1 Day Post-Op Procedure(s) (LRB): CORONARY ARTERY BYPASS GRAFTING (CABG) TIMES FOUR: (LIMA to LAD, CRYO VEIN to OM1, SVG to OM2, and SVG to PDA) with PARTIAL EVH/OPEN from RIGHT and LEFT GREATER SAPHENOUS VEIN and LEFT INTERNAL MAMMARY ARTERY (N/A) TRANSESOPHAGEAL ECHOCARDIOGRAM (TEE) (N/A)  Total Length of Stay:  LOS: 5 days   Subjective: Patient awake and alert this am. He has no specific complaints  Objective: Vital signs in last 24 hours: Temp:  [95.9 F (35.5 C)-99.5 F (37.5 C)] 99.3 F (37.4 C) (12/11 0715) Pulse Rate:  [80-85] 82 (12/10 2205) Cardiac Rhythm: Atrial paced (12/11 0000) Resp:  [12-31] 20 (12/11 0715) BP: (92-154)/(53-77) 112/59 (12/11 0715) SpO2:  [92 %-100 %] 95 % (12/11 0715) Arterial Line BP: (91-150)/(32-75) 103/43 (12/11 0715) FiO2 (%):  [40 %-50 %] 40 % (12/10 2225) Weight:  [80.4 kg] 80.4 kg (12/11 0500)  Filed Weights   03/31/18 0502 04/01/18 0513 04/02/18 0500  Weight: 74.6 kg 74.3 kg 80.4 kg    Weight change: 6.146 kg   Hemodynamic parameters for last 24 hours: PAP: (14-33)/(1-20) 16/3 CO:  [4 L/min-7.3 L/min] 7.3 L/min CI:  [2.1 L/min/m2-3.8 L/min/m2] 3.8 L/min/m2  Intake/Output from previous day: 12/10 0701 - 12/11 0700 In: 7386.3 [P.O.:540; I.V.:3227.4; Blood:1397; IV Piggyback:2221.9] Out: 0998 [Urine:3550; Drains:120; Chest Tube:680]  Intake/Output this shift: No intake/output data recorded.  Current Meds: Scheduled Meds: . sodium chloride   Intravenous Once  . acetaminophen  1,000 mg Oral Q6H   Or  . acetaminophen (TYLENOL) oral liquid 160 mg/5 mL  1,000 mg Per Tube Q6H  . aspirin EC  325 mg Oral Daily   Or  . aspirin  324 mg Per Tube Daily  . atorvastatin  80 mg Oral q1800  . bisacodyl  10 mg Oral Daily   Or  . bisacodyl  10 mg Rectal Daily  . chlorhexidine gluconate (MEDLINE KIT)  15  mL Mouth Rinse BID  . Chlorhexidine Gluconate Cloth  6 each Topical Daily  . docusate sodium  200 mg Oral Daily  . furosemide  20 mg Intravenous BID  . hydrocortisone   Rectal QID  . insulin regular  0-10 Units Intravenous TID WC  . mesalamine  2.4 g Oral Q breakfast  . metoCLOPramide (REGLAN) injection  10 mg Intravenous Q6H  . metoprolol tartrate  12.5 mg Oral BID   Or  . metoprolol tartrate  12.5 mg Per Tube BID  . mupirocin ointment  1 application Nasal BID  . [START ON 04/03/2018] pantoprazole  40 mg Oral Daily  . PARoxetine  20 mg Oral Daily  . sodium chloride flush  10-40 mL Intracatheter Q12H  . sodium chloride flush  3 mL Intravenous Q12H   Continuous Infusions: . sodium chloride 20 mL/hr at 04/02/18 0700  . sodium chloride    . sodium chloride    . albumin human 12.5 g (04/02/18 0101)  . cefUROXime (ZINACEF)  IV Stopped (04/01/18 2012)  . insulin 3.9 mL/hr at 04/02/18 0700  . lactated ringers    . lactated ringers 20 mL/hr at 04/02/18 0700  . milrinone 0.25 mcg/kg/min (04/02/18 0700)  . nitroGLYCERIN    . norepinephrine (LEVOPHED) Adult infusion Stopped (  04/02/18 0542)  . phenylephrine (NEO-SYNEPHRINE) Adult infusion Stopped (04/02/18 0253)   PRN Meds:.sodium chloride, albumin human, levalbuterol, metoprolol tartrate, midazolam, ondansetron (ZOFRAN) IV, oxyCODONE, sodium chloride flush, sodium chloride flush, traMADol  General appearance: cooperative and no distress Neurologic: intact Heart: RRR Lungs: Diminshed at bases Abdomen: Soft, non tender, sporadic bowel sounds present Extremities: SCD LLE. Left thigh and RLE ++ edema. Right thigh JP drain with bloody drainage  Wound: Aquacel intact;Bilateral LE dressings are clean and dry  Lab Results: CBC: Recent Labs    04/01/18 2119 04/01/18 2124 04/02/18 0355  WBC 11.5*  --  10.6*  HGB 8.4* 8.5* 7.6*  HCT 24.5* 25.0* 22.6*  PLT 66*  --  65*   BMET:  Recent Labs    04/01/18 0429  04/01/18 2124  04/02/18 0355  NA 140   < > 138 139  K 4.2   < > 4.3 4.0  CL 107   < > 107 110  CO2 24  --   --  24  GLUCOSE 144*   < > 129* 124*  BUN 20   < > 13 14  CREATININE 1.39*   < > 1.00 1.30*  CALCIUM 9.0  --   --  8.2*   < > = values in this interval not displayed.    CMET: Lab Results  Component Value Date   WBC 10.6 (H) 04/02/2018   HGB 7.6 (L) 04/02/2018   HCT 22.6 (L) 04/02/2018   PLT 65 (L) 04/02/2018   GLUCOSE 124 (H) 04/02/2018   CHOL 154 03/28/2018   TRIG 67 03/28/2018   HDL 36 (L) 03/28/2018   LDLCALC 105 (H) 03/28/2018   ALT 22 09/03/2017   AST 17 09/03/2017   NA 139 04/02/2018   K 4.0 04/02/2018   CL 110 04/02/2018   CREATININE 1.30 (H) 04/02/2018   BUN 14 04/02/2018   CO2 24 04/02/2018   TSH 1.707 03/28/2018   INR 1.76 04/01/2018   HGBA1C 6.9 (H) 03/26/2018      PT/INR:  Recent Labs    04/01/18 1538  LABPROT 20.3*  INR 1.76   Radiology: Dg Chest Portable 1 View  Result Date: 04/01/2018 CLINICAL DATA:  Postop lines and tubes EXAM: PORTABLE CHEST 1 VIEW COMPARISON:  03/26/2018, 07/05/2016 FINDINGS: Endotracheal tube tip slightly curved to the right, carina is poorly defined. Chest and mediastinal drainage catheters, left-sided chest tube tip projects over the left mid lung. Right IJ Swan-Ganz catheter tip projects over the right pulmonary artery. Low lung volumes. Mild cardiomegaly. Subsegmental atelectasis at the left base. There are possible tiny apical pneumothoraces. IMPRESSION: 1. Endotracheal tube tip slightly curved to the patient's right, relationship of the tip of the tube to the carina is indeterminate as the carina is poorly visible on the current study. Rightward deviation of the tip of the tube raises possibility of right mainstem encroachment, consider tube reposition and repeat radiograph. 2. Interval sternotomy changes and placement of chest drainage catheters. There are probable tiny apical pneumothoraces which may be re-evaluated at radiographic  follow-up. 3. Subsegmental atelectasis at the left base. Electronically Signed   By: Donavan Foil M.D.   On: 04/01/2018 15:29     Assessment/Plan: S/P Procedure(s) (LRB): CORONARY ARTERY BYPASS GRAFTING (CABG) TIMES FOUR: (LIMA to LAD, CRYO VEIN to OM1, SVG to OM2, and SVG to PDA) with PARTIAL EVH/OPEN from RIGHT and LEFT GREATER SAPHENOUS VEIN and LEFT INTERNAL MAMMARY ARTERY (N/A) TRANSESOPHAGEAL ECHOCARDIOGRAM (TEE) (N/A)  1. CV-First degree heart block, previously  A paced.CO/CI 7.3/3.8. On Milrinone drip-await this am's co ox. 2. Pulmonary-on 4 liters of oxygen via [place]. Wean as able over next few days. Chest tubes 680 cc since surgery. CXR this am appears to show low lung volumes, bibasilar atelectasis and pleural effusions. Encourage incentive spirometer and flutter valve. 3. Volume overload 4. ABL anemia- H and H decreased to 7.6 and 22.6 5. DM-CBGs 108/105/115. On Insulin drip-will wean off. Pre op HGA1C 6.9. Will restart Glipizide and Metformin once tolerating oral better.  6. Right thigh JP drain with 120 cc since surgery. To remain for now. 7. Thrombocytopenia-platelets 65,000 this am. Will hold ecasa for now 7. Please see progression orders     Donielle Liston Alba PA-C 04/02/2018 8:02 AM   Leave chest tubes in but mobilize patient out of bed and diurese.  Hemoglobin 7.6 for expected postop anemia with perioperative coagulopathy.  patient examined and medical record reviewed,agree with above note. Tharon Aquas Trigt III 04/02/2018

## 2018-04-02 NOTE — Progress Notes (Signed)
RT note: RT instructed patient with use of flutter valve using the teach back method. Patient displayed understanding and proper use with no sputum production.

## 2018-04-02 NOTE — Progress Notes (Signed)
CRITICAL VALUE ALERT  Critical Value:  Hemoglobin 6.9 on Co-oxemetry panel  Date & Time Notied:  04/02/2018 at 0830  Provider Notified: MD aware of AM lab values and blood already ordered.

## 2018-04-02 NOTE — Anesthesia Postprocedure Evaluation (Signed)
Anesthesia Post Note  Patient: [name] Wearing  Procedure(s) Performed: CORONARY ARTERY BYPASS GRAFTING (CABG) TIMES FOUR: (LIMA to LAD, CRYO VEIN to OM1, SVG to OM2, and SVG to PDA) with PARTIAL EVH/OPEN from RIGHT and LEFT GREATER SAPHENOUS VEIN and LEFT INTERNAL MAMMARY ARTERY (N/A Chest) TRANSESOPHAGEAL ECHOCARDIOGRAM (TEE) (N/A )     Patient location during evaluation: SICU Anesthesia Type: General Level of consciousness: awake Pain management: pain level controlled Vital Signs Assessment: post-procedure vital signs reviewed and stable Respiratory status: spontaneous breathing Cardiovascular status: stable Postop Assessment: no apparent nausea or vomiting Anesthetic complications: no    Last Vitals:  Vitals:   04/02/18 0930 04/02/18 1001  BP: 139/65 127/65  Pulse:    Resp: (!) 26 20  Temp: 37.3 C 37.5 C  SpO2: 97% 96%    Last Pain:  Vitals:   04/02/18 1001  TempSrc: Core  PainSc: 5                  Caitlyn Buchanan,W. EDMOND

## 2018-04-03 ENCOUNTER — Inpatient Hospital Stay (HOSPITAL_COMMUNITY): Payer: Medicare HMO

## 2018-04-03 LAB — BASIC METABOLIC PANEL
Anion gap: 8 (ref 5–15)
BUN: 17 mg/dL (ref 8–23)
CO2: 23 mmol/L (ref 22–32)
Calcium: 7.9 mg/dL — ABNORMAL LOW (ref 8.9–10.3)
Chloride: 104 mmol/L (ref 98–111)
Creatinine, Ser: 1.35 mg/dL — ABNORMAL HIGH (ref 0.61–1.24)
GFR calc Af Amer: >60 mL/min (ref >60)
GFR calc non Af Amer: 52 mL/min — ABNORMAL LOW (ref >60)
Glucose, Bld: 139 mg/dL — ABNORMAL HIGH (ref 70–99)
Potassium: 3.7 mmol/L (ref 3.5–5.1)
Sodium: 135 mmol/L (ref 135–145)

## 2018-04-03 LAB — COOXEMETRY PANEL
Carboxyhemoglobin: 1.8 % — ABNORMAL HIGH (ref 0.5–1.5)
Methemoglobin: 1.3 % (ref 0.0–1.5)
O2 Saturation: 73.4 %
Total hemoglobin: 8.7 g/dL — ABNORMAL LOW (ref 12.0–16.0)

## 2018-04-03 LAB — GLUCOSE, CAPILLARY
GLUCOSE-CAPILLARY: 153 mg/dL — AB (ref 70–99)
GLUCOSE-CAPILLARY: 193 mg/dL — AB (ref 70–99)
Glucose-Capillary: 117 mg/dL — ABNORMAL HIGH (ref 70–99)
Glucose-Capillary: 131 mg/dL — ABNORMAL HIGH (ref 70–99)
Glucose-Capillary: 139 mg/dL — ABNORMAL HIGH (ref 70–99)
Glucose-Capillary: 210 mg/dL — ABNORMAL HIGH (ref 70–99)

## 2018-04-03 LAB — RENAL FUNCTION PANEL
Albumin: 3.6 g/dL (ref 3.5–5.0)
Anion gap: 13 (ref 5–15)
BUN: 17 mg/dL (ref 8–23)
CO2: 25 mmol/L (ref 22–32)
Calcium: 8.5 mg/dL — ABNORMAL LOW (ref 8.9–10.3)
Chloride: 93 mmol/L — ABNORMAL LOW (ref 98–111)
Creatinine, Ser: 1.29 mg/dL — ABNORMAL HIGH (ref 0.61–1.24)
GFR calc Af Amer: >60 mL/min (ref >60)
GFR calc non Af Amer: 55 mL/min — ABNORMAL LOW (ref >60)
Glucose, Bld: 180 mg/dL — ABNORMAL HIGH (ref 70–99)
Phosphorus: 2.3 mg/dL — ABNORMAL LOW (ref 2.5–4.6)
Potassium: 3.3 mmol/L — ABNORMAL LOW (ref 3.5–5.1)
Sodium: 131 mmol/L — ABNORMAL LOW (ref 135–145)

## 2018-04-03 LAB — CBC
HCT: 25.2 % — ABNORMAL LOW (ref 39.0–52.0)
Hemoglobin: 8.3 g/dL — ABNORMAL LOW (ref 13.0–17.0)
MCH: 30 pg (ref 26.0–34.0)
MCHC: 32.9 g/dL (ref 30.0–36.0)
MCV: 91 fL (ref 80.0–100.0)
Platelets: 66 10*3/uL — ABNORMAL LOW (ref 150–400)
RBC: 2.77 MIL/uL — ABNORMAL LOW (ref 4.22–5.81)
RDW: 15.8 % — ABNORMAL HIGH (ref 11.5–15.5)
WBC: 11.1 10*3/uL — ABNORMAL HIGH (ref 4.0–10.5)
nRBC: 0 % (ref 0.0–0.2)

## 2018-04-03 MED ORDER — GUAIFENESIN ER 600 MG PO TB12
600.0000 mg | ORAL_TABLET | Freq: Two times a day (BID) | ORAL | Status: DC
  Administered 2018-04-03 – 2018-04-09 (×13): 600 mg via ORAL
  Filled 2018-04-03 (×13): qty 1

## 2018-04-03 MED ORDER — HYDROXYZINE HCL 25 MG PO TABS
25.0000 mg | ORAL_TABLET | Freq: Two times a day (BID) | ORAL | Status: DC | PRN
  Administered 2018-04-03 – 2018-04-06 (×5): 25 mg via ORAL
  Filled 2018-04-03 (×5): qty 1

## 2018-04-03 MED ORDER — POTASSIUM CHLORIDE 10 MEQ/50ML IV SOLN
10.0000 meq | INTRAVENOUS | Status: AC | PRN
  Administered 2018-04-03 (×3): 10 meq via INTRAVENOUS
  Filled 2018-04-03 (×3): qty 50

## 2018-04-03 MED ORDER — AMIODARONE HCL IN DEXTROSE 360-4.14 MG/200ML-% IV SOLN
60.0000 mg/h | INTRAVENOUS | Status: AC
  Administered 2018-04-03: 60 mg/h via INTRAVENOUS
  Filled 2018-04-03 (×2): qty 200

## 2018-04-03 MED ORDER — AMIODARONE HCL IN DEXTROSE 360-4.14 MG/200ML-% IV SOLN
30.0000 mg/h | INTRAVENOUS | Status: DC
  Administered 2018-04-03 – 2018-04-04 (×2): 30 mg/h via INTRAVENOUS
  Filled 2018-04-03: qty 200

## 2018-04-03 MED ORDER — AMIODARONE IV BOLUS ONLY 150 MG/100ML
150.0000 mg | Freq: Once | INTRAVENOUS | Status: AC
  Administered 2018-04-03: 150 mg via INTRAVENOUS
  Filled 2018-04-03: qty 100

## 2018-04-03 MED ORDER — METOPROLOL TARTRATE 25 MG PO TABS
25.0000 mg | ORAL_TABLET | Freq: Two times a day (BID) | ORAL | Status: DC
  Administered 2018-04-03 – 2018-04-06 (×7): 25 mg via ORAL
  Filled 2018-04-03 (×7): qty 1

## 2018-04-03 MED ORDER — METOLAZONE 5 MG PO TABS
5.0000 mg | ORAL_TABLET | Freq: Once | ORAL | Status: AC
  Administered 2018-04-03: 5 mg via ORAL
  Filled 2018-04-03: qty 1

## 2018-04-03 NOTE — Discharge Instructions (Signed)
Discharge Instructions:  1. You may shower, please wash incisions daily with soap and water and keep dry.  If you wish to cover wounds with dressing you may do so but please keep clean and change daily.  No tub baths or swimming until incisions have completely healed.  If your incisions become red or develop any drainage please call our office at [phone]  2. No Driving until cleared by Dr. Lucianne Lei Trigt's office and you are no longer using narcotic pain medications  3. Monitor your weight daily.. Please use the same scale and weigh at same time... If you gain 5-10 lbs in 48 hours with associated lower extremity swelling, please contact our office at [phone]  4. Fever of 101.5 for at least 24 hours with no source, please contact our office at [phone]  5. Activity- up as tolerated, please walk at least 3 times per day.  Avoid strenuous activity, no lifting, pushing, or pulling with your arms over 8-10 lbs for a minimum of 6 weeks  6. If any questions or concerns arise, please do not hesitate to contact our office at [phone]   Coronary Artery Bypass Grafting, Care After These instructions give you information on caring for yourself after your procedure. Your doctor may also give you more specific instructions. Call your doctor if you have any problems or questions after your procedure. Follow these instructions at home:  Only take medicine as told by your doctor. Take medicines exactly as told. Do not stop taking medicines or start any new medicines without talking to your doctor first.  Take your pulse as told by your doctor.  Do deep breathing as told by your doctor. Use your breathing device (incentive spirometer), if given, to practice deep breathing several times a day. Support your chest with a pillow or your arms when you take deep breaths or cough.  Keep the area clean, dry, and protected where the surgery cuts (incisions) were made. Remove bandages (dressings) only as  told by your doctor. If strips were applied to surgical area, do not take them off. They fall off on their own.  Check the surgery area daily for puffiness (swelling), redness, or leaking fluid.  If surgery cuts were made in your legs: ? Avoid crossing your legs. ? Avoid sitting for long periods of time. Change positions every 30 minutes. ? Raise your legs when you are sitting. Place them on pillows.  Wear stockings that help keep blood clots from forming in your legs (compression stockings).  Only take sponge baths until your doctor says it is okay to take showers. Pat the surgery area dry. Do not rub the surgery area with a washcloth or towel. Do not bathe, swim, or use a hot tub until your doctor says it is okay.  Eat foods that are high in fiber. These include raw fruits and vegetables, whole grains, beans, and nuts. Choose lean meats. Avoid canned, processed, and fried foods.  Drink enough fluids to keep your pee (urine) clear or pale yellow.  Weigh yourself every day.  Rest and limit activity as told by your doctor. You may be told to: ? Stop any activity if you have chest pain, shortness of breath, changes in heartbeat, or dizziness. Get help right away if this happens. ? Move around often for short amounts of time or take short walks as told by your doctor. Gradually become more active. You may need help to strengthen your muscles and build endurance. ? Avoid lifting, pushing, or pulling  anything heavier than 10 pounds (4.5 kg) for at least 6 weeks after surgery.  Do not drive until your doctor says it is okay.  Ask your doctor when you can go back to work.  Ask your doctor when you can begin sexual activity again.  Follow up with your doctor as told. Contact a doctor if:  You have puffiness, redness, more pain, or fluid draining from the incision site.  You have a fever.  You have puffiness in your ankles or legs.  You have pain in your legs.  You gain 2 or more  pounds (0.9 kg) a day.  You feel sick to your stomach (nauseous) or throw up (vomit).  You have watery poop (diarrhea). Get help right away if:  You have chest pain that goes to your jaw or arms.  You have shortness of breath.  You have a fast or irregular heartbeat.  You notice a "clicking" in your breastbone when you move.  You have numbness or weakness in your arms or legs.  You feel dizzy or light-headed. This information is not intended to replace advice given to you by your health care provider. Make sure you discuss any questions you have with your health care provider. Document Released: 04/14/2013 Document Revised: 09/15/2015 Document Reviewed: 09/16/2012 Elsevier Interactive Patient Education  2017 [employer].

## 2018-04-03 NOTE — Progress Notes (Signed)
CT surgery p.m. Rounds  Patient was able to walk in hallway today Now back in sinus rhythm on IV amiodarone Diuresing well Supplement potassium for hypokalemia 3.3

## 2018-04-03 NOTE — Discharge Summary (Addendum)
Physician Discharge Summary       [address]       [place] [address]             [phone]    Patient ID: [name] MRN: [id number] DOB/AGE: [birth year] 72 y.o.  Admit date: 03/26/2018 Discharge date: 04/09/2018  Admission Diagnoses: Coronary artery disease Discharge Diagnoses:  1. S/p CABG x 4 2. Thrombocytopenia 3. ABL anemia 4. History of type 2 diabetes mellitus ([name]) 5. History of hypercholesterolemia 6. History of Tobacco use 7. History of essential hypertension 8. History of anxiety and depression  9. History of PAD (peripheral artery disease) ([name]) 10. History of Crohn's disease ([name])  11. History of CTS (carpal tunnel syndrome) 12. 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 History of optic neuritis   Procedure (s):  LEFT HEART CATH AND CORONARY ANGIOGRAPHY by Dr. Claiborne Billings on 03/27/2018:  Conclusion     Prox RCA lesion is 80% stenosed.  Mid RCA-1 lesion is 80% stenosed.  Mid RCA-2 lesion is 90% stenosed.  Dist RCA lesion is 99% stenosed.  Prox Cx lesion is 80% stenosed.  Ost 2nd Mrg lesion is 70% stenosed.  Ost LAD to Prox LAD lesion is 95% stenosed.  Prox LAD lesion is 80% stenosed.  Ost 1st Diag lesion is 90% stenosed.  Dist LAD lesion is 80% stenosed.   Severe three-vessel coronary obstructive disease with 85% in-stent restenosis in the proximal LAD stent followed by 80% LAD stenosis, 90% first diagonal stenosis and 80% apical LAD stenosis; 80% proximal left circumflex stenosis with 70% circumflex marginal stenosis; and calcified RCA with diffuse disease with stenoses of 80% proximal mid 90% before the acute margin and 99% after the acute margin with extensive collateralization from the very proximal conus proximal branches supplying the distal RCA.  LVEDP 16 mmHg.  RECOMMENDATION: Surgical consultation for CABG revascularization.  High potency statin therapy get LDL less than 70.  The patient will be started on  heparin 8 hours post sheath discontinuance.  Continue aspirin.  Will avoid P2 Y 12 inhibition due to need for CABG revascularization    1.  Coronary artery bypass grafting x4 (left internal mammary artery to left anterior descending, saphenous vein graft to posterior descending, saphenous vein graft to OM2, cryopreserved saphenous vein graft to OM1). 2.  Endoscopic harvest of bilateral greater saphenous vein. 3.  Use of reconstituted human allograft cryopreserved, reconstituted saphenous vein due to lack of native vein conduit that was acceptable for use by Dr. [name] on 04/01/2018.  History of Presenting Illness: This is a 72 year old retired [place] male reformed smoker recently admitted with symptoms of unstable angina through the emergency department at [facility].  The patient also had symptoms of dyspnea with minimal activities but no edema PND or orthopnea.  Troponin levels were initially negative and chest x-ray was unremarkable.  Past medical history is positive for known peripheral vascular disease with bilateral SFA occlusion and mild claudication.  Also history of adenocarcinoma prostate treated with radioactive seeds.  He has non-insulin-dependent diabetes. Cardiac catheterization sedation was performed yesterday.  This demonstrates severe three-vessel CAD with occlusion of the RCA, 90% LAD stenosis, and 80% stenosis of the OM1 and distal circumflex.  LVEDP is normal.  Echocardiogram shows preserved LV systolic function without significant valvular disease.  Patient is currently stable on IV heparin.  The patient's CABG Dopplers showed no significant carotid disease but with bilateral ABIs reduced at 0.5. Dr. [name] discussed the need for  coronary artery bypass grating surgery. Potential risks, benefits, and complications of the surgery were discussed with the patient and he agreed to proceed with surgery. Pre operative carotid duplex showed no significant internal carotid artery  stenosis bilaterally. He will need to remain in the hospital on IV heparin until surgery. The first available date for his operation is December 10.  Brief Hospital Course:  The patient was extubated the evening of surgery without difficulty. He remained afebrile and hemodynamically stable. He was weaned off Milrinone drip. Gordy Councilman, a line, chest tubes, and foley were removed early in the post operative course. Lopressor was started and titrated accordingly. He had thrombocytopenia post op. 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 SRA was within the normal range. Thrombocytopenia did resolve as last platelet count was up to 191,000. He was volume over loaded and diuresed with Lasix and Metolazone initially followed by Lasix only. He had ABL anemia. He did not require a post op transfusion. Last H and H was 9.5 and 29.9. He was weaned off the insulin drip.  His home oral diabetic medicines Metformin and Glipizide were restarted at discharge, as creatinine closer to baseline.  The patient's glucose remained well controlled.The patient's HGA1C pre op was 6.9 . The patient was felt surgically stable for transfer from the ICU to PCTU for further convalescence on 04/05/2018. He continues to progress with cardiac rehab. He was ambulating on room air. He has been tolerating a diet and has had a bowel movement. Epicardial pacing wires were removed on 04/07/2018. Chest tube sutures were removed on 12/18. Bilateral lower leg staples will be removed on in the office on Monday 04/14/2018 . Because cryo vein was used, ecasa was decreased to 81 mg daily and he was started on Plavix 75 mg daily. Also, per Dr. [name], Coreg 3.125 mg daily to be started at discharge.As discussed with Dr. [name], the patient is felt surgically stable for discharge today.   Latest Vital Signs: Blood pressure (!) 94/50, pulse 76, temperature 97.9 F (36.6 C), temperature source Oral, resp. rate 20, height 5' 11"   (1.803 m), weight 72.7 kg, SpO2 97 %.  Physical Exam: Cardiovascular: RRR Pulmonary: Clear to auscultation bilaterally Abdomen: Soft, non tender, +++ bowel sounds present. Extremities: No LE edema.Ecchymosis left and right thigh Wounds: Sternal wound is clean and dry.  No erythema or signs of infection. Bilateral LE wounds are clean and dry  Discharge Condition: Stable and discharged to home.  Recent laboratory studies:  Lab Results  Component Value Date   WBC 11.5 (H) 04/08/2018   HGB 9.5 (L) 04/08/2018   HCT 29.9 (L) 04/08/2018   MCV 91.4 04/08/2018   PLT 308 04/08/2018   Lab Results  Component Value Date   NA 134 (L) 04/09/2018   K 4.6 04/09/2018   CL 98 04/09/2018   CO2 25 04/09/2018   CREATININE 1.58 (H) 04/09/2018   GLUCOSE 128 (H) 04/09/2018    Diagnostic Studies: Dg Chest 2 View  Result Date: 04/06/2018 CLINICAL DATA:  Atelectasis EXAM: CHEST - 2 VIEW COMPARISON:  April 05, 2018 FINDINGS: Possible tiny effusions, unchanged. Cardiomediastinal silhouette is stable. Mild atelectasis in the left base. No other change. IMPRESSION: Tiny pleural effusions versus pleural thickening. No acute abnormalities are noted. Electronically Signed   By: Dorise Bullion III M.D   On: 04/06/2018 08:02   Vas US Doppler Pre Cabg  Result Date: 03/30/2018 PREOPERATIVE VASCULAR EVALUATION  Indications:  Pre CABG evaulation. Risk Factors:     Hypertension, Diabetes. Comparison Study: Carotid exam on 09/03/2016 Performing Technologist: COLE, Hongying RDMS, RVT  Examination Guidelines: A complete evaluation includes B-mode imaging, spectral Doppler, color Doppler, and power Doppler as needed of all accessible portions of each vessel. Bilateral testing is considered an integral part of a complete examination. Limited examinations for reoccurring indications may be performed as noted.  Right Carotid Findings: +----------+--------+--------+--------+-------------------+--------------------+            PSV cm/sEDV cm/sStenosisDescribe           Comments             +----------+--------+--------+--------+-------------------+--------------------+ CCA Prox  80      19                                                      +----------+--------+--------+--------+-------------------+--------------------+ CCA Distal102     21                                 focal thinckening of                                                      intimal vs plaque.   +----------+--------+--------+--------+-------------------+--------------------+ ICA Prox  126     31      1-39%   focal, calcific and                                                       hyperechoic                             +----------+--------+--------+--------+-------------------+--------------------+ ICA Distal137     44                                                      +----------+--------+--------+--------+-------------------+--------------------+ ECA       104     16                                                      +----------+--------+--------+--------+-------------------+--------------------+ Portions of this table do not appear on this page. +----------+--------+-------+--------+------------+           PSV cm/sEDV cmsDescribeArm Pressure +----------+--------+-------+--------+------------+ Subclavian150                    156          +----------+--------+-------+--------+------------+ +---------+--------+---+--------+--+---------+ VertebralPSV cm/s100EDV cm/s16Antegrade +---------+--------+---+--------+--+---------+ Left Carotid Findings: +----------+--------+--------+--------+-------------------------------+--------+           PSV cm/sEDV cm/sStenosisDescribe  Comments +----------+--------+--------+--------+-------------------------------+--------+ CCA Prox  109     24                                             tortuous  +----------+--------+--------+--------+-------------------------------+--------+ CCA Distal123     21                                                      +----------+--------+--------+--------+-------------------------------+--------+ ICA Prox  119     23      1-39%   focal, hyperechoic and calcific         +----------+--------+--------+--------+-------------------------------+--------+ ICA Distal111     29                                                      +----------+--------+--------+--------+-------------------------------+--------+ ECA       141     20                                                      +----------+--------+--------+--------+-------------------------------+--------+ +----------+--------+--------+--------+------------+ SubclavianPSV cm/sEDV cm/sDescribeArm Pressure +----------+--------+--------+--------+------------+           172                     157          +----------+--------+--------+--------+------------+ +---------+--------+--+--------+----------------------------+ VertebralPSV cm/s44EDV cm/sAntegrade and High resistant +---------+--------+--+--------+----------------------------+  ABI Findings: +---------+------------------+-----+-------------------+--------+ Right    Rt Pressure (mmHg)IndexWaveform           Comment  +---------+------------------+-----+-------------------+--------+ Brachial 156                    triphasic                   +---------+------------------+-----+-------------------+--------+ PTA      73                0.46 dampened monophasic         +---------+------------------+-----+-------------------+--------+ DP       97                0.62 dampened monophasic         +---------+------------------+-----+-------------------+--------+ Great Toe72                0.46                             +---------+------------------+-----+-------------------+--------+  +---------+------------------+-----+-------------------+-------+ Left     Lt Pressure (mmHg)IndexWaveform           Comment +---------+------------------+-----+-------------------+-------+ Brachial 157                    triphasic                  +---------+------------------+-----+-------------------+-------+ PTA      68                0.43 dampened  monophasic        +---------+------------------+-----+-------------------+-------+ DP       75                     dampened monophasic        +---------+------------------+-----+-------------------+-------+ Great Toe54                0.34                            +---------+------------------+-----+-------------------+-------+ +-------+---------------+----------------+ ABI/TBIToday's ABI/TBIPrevious ABI/TBI +-------+---------------+----------------+ Right  0.62/0.46                       +-------+---------------+----------------+ Left   0.48/0.34                       +-------+---------------+----------------+  Right Doppler Findings: +--------+--------+-----+---------+----------------+ Site    PressureIndexDoppler  Comments         +--------+--------+-----+---------+----------------+ [id number]          triphasic                 +--------+--------+-----+---------+----------------+ Radial               triphasichigh bifurcation +--------+--------+-----+---------+----------------+ Ulnar                biphasic                  +--------+--------+-----+---------+----------------+  Left Doppler Findings: +--------+--------+-----+---------+--------+ Site    PressureIndexDoppler  Comments +--------+--------+-----+---------+--------+ [id number]          triphasic         +--------+--------+-----+---------+--------+ Radial               biphasic          +--------+--------+-----+---------+--------+ Ulnar                triphasic         +--------+--------+-----+---------+--------+   Summary: Right Carotid: Velocities in the right ICA are consistent with a 1-39% stenosis. Left Carotid: Velocities in the left ICA are consistent with a 1-39% stenosis. Vertebrals: Bilateral vertebral arteries demonstrate antegrade flow. Left             vertebral artery demonstrates high resistant flow. Right ABI: Resting right ankle-brachial index indicates moderate right lower extremity arterial disease. The right toe-brachial index is abnormal. Left ABI: Resting left ankle-brachial index indicates severe left lower extremity arterial disease. The left toe-brachial index is abnormal. Right Upper Extremity: Doppler waveform obliterate with right radial compression. Doppler waveforms remain within normal limits with right ulnar compression. Left Upper Extremity: Doppler waveforms remain within normal limits with left radial compression. Doppler waveforms decrease <50% with left ulnar compression.  Electronically signed by Harold Barban MD on 03/30/2018 at 8:25:37 AM.    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   ascorbic acid 500 MG tablet Commonly known as:  VITAMIN C Take 500 mg by mouth 2 (two) times daily.   aspirin 81 MG EC tablet Take 1 tablet (81 mg total)  by mouth daily. Start taking on:  April 10, 2018 What changed:    medication strength  how much to take   clopidogrel 75 MG tablet Commonly known as:  PLAVIX Take 1 tablet (75 mg total) by mouth daily. Start taking on:  April 10, 2018   clotrimazole 1 % cream Commonly known as:  LOTRIMIN Apply 1 application topically 2 (two) times daily as needed.   docusate sodium 100 MG capsule Commonly known as:  COLACE Take 100 mg by mouth 2 (two) times daily.   ferrous sulfate 325 (65 FE) MG tablet Take 1 tablet (325 mg total) by mouth daily with breakfast. For one month then stop. If has constipation, may stop sooner.   flunisolide 29 MCG/ACT nasal spray Commonly known as:  NASAREL Place 1 spray into the nose daily as needed for allergies.   glipiZIDE 5 MG tablet Commonly known as:  GLUCOTROL Take 1 tablet (5 mg total) by mouth daily before breakfast.   guaiFENesin 600 MG 12 hr tablet Commonly known as:  MUCINEX Take 1 tablet (600 mg total) by mouth 2 (two) times daily as needed.   mesalamine 1.2 g EC tablet Commonly known as:  LIALDA Take 2.4 g by mouth daily with breakfast.   metFORMIN 500 MG tablet Commonly known as:  GLUCOPHAGE Take 1 tablet (500 mg total) by mouth 2 (two) times daily with a meal.   OLANZapine 5 MG tablet Commonly known as:  ZYPREXA Take by mouth. Takes one half every day   omeprazole 20 MG capsule Commonly known as:  PRILOSEC Take 20 mg by mouth daily.   PARoxetine 40 MG tablet Commonly known as:  PAXIL Take 60 mg by mouth at bedtime. Take 1 1/2 tablets at bedtime   simvastatin 80 MG tablet Commonly known as:  ZOCOR Take 1 tablet (80 mg total) by mouth at bedtime.   traMADol 50 MG tablet Commonly known as:  ULTRAM Take 50 mg by mouth every 4-6 hours PRN severe pain.   traZODone 50 MG tablet Commonly known as:  DESYREL Take 50 mg by mouth at bedtime.   triamcinolone cream 0.1 % Commonly known as:  KENALOG Apply 1 application  topically 2 (two) times daily. What changed:    when to take this  reasons to take this            Durable Medical Equipment  (From admission, onward)         Start     Ordered   04/09/18 1807  For home use only DME Walker rolling  Once    Question:  Patient needs a walker to treat with the following condition  Answer:  Hx of CABG   04/08/18 1807         The patient has been discharged on:   1.Beta Blocker:  Yes [ x  ]                              No   [   ]  If No, reason:  2.Ace Inhibitor/ARB: Yes [   ]                                     No  [  x  ]                                     If No, reason:Labile BP and elevated creatinine  3.Statin:   Yes [ x  ]                  No  [   ]                  If No, reason:  4.Ecasa:  Yes  [ x  ]                  No   [   ]                  If No, reason:  Follow Up Appointments: Follow-up Information    [name], MD. Go on 05/14/2018.   Specialty:  Cardiothoracic Surgery Why:  PA/LAT CXR to be taken (at [facility] which is in the same building as Dr. Lucianne Lei Trigt's office) on 05/14/2018 at 11:00 am;Appointment time is at 11:30 am Contact information: [place] [address] [phone]        Almyra Deforest, [place]. Go on 04/21/2018.   Specialties:  Cardiology, Radiology Why:  Appointment time is at 2:30 pm Contact information: [address] [place] [address] [phone]        Nurse. Go on 04/14/2018.   Why:  Appointment is with nurse only to have staples removed. Appointment time is at 10:45 am Contact information: [place] [address]          Signed: Sharalyn Ink [facility] 04/09/2018, 9:27 AM  patient examined and medical record reviewed,agree with above note. Tharon Aquas Trigt III 04/15/2018

## 2018-04-03 NOTE — Care Management Note (Addendum)
Case Management Note  Patient Details  Name: [name] MRN: [id number] Date of Birth: May 01, 1945  Subjective/Objective: 72 yo male presented for evaluation of CP; s/p CABG.                 Action/Plan: CM met with patient/son to discuss dispositional needs. Patient lived at home with spouse PTA, independent with ADLs. PCP verified as: Dr. Baltazar Apo, also followed by Dr. Juline Patch [facility]); pharmacy: [facility]. Family requested CM to contact [facility] to inform of patients hospitalization. CM spoke to [facility] [employer]) and to [facility] Scientist, research (life sciences)) and made both aware of patients hospitalization/POC as requested. Please contact [facility] for patients transitional needs: [phone] (ext [phone]) or Fritz Pickerel (ext [phone]); Fax: [phone]. Plans for patient to transition home with his daughter/son-in-law for 24/7 assistance after discharge. CM team will continue following for needs.   Expected Discharge Date:                 Expected Discharge Plan:  Home/Self Care  In-House Referral:  NA  Discharge planning Services  CM Consult  Post Acute Care Choice:  NA Choice offered to:  NA  DME Arranged:  N/A DME Agency:  NA  HH Arranged:  NA HH Agency:  NA  Status of Service:  In process, will continue to follow  If discussed at Long Length of Stay Meetings, dates discussed:    Additional Comments:  Midge Minium RN, BSN, NCM-BC, ACM-RN [phone] 04/03/2018, 2:04 PM

## 2018-04-03 NOTE — Progress Notes (Addendum)
TCTS DAILY ICU PROGRESS NOTE                   [address]            [name] [address]          [phone]   2 Days Post-Op Procedure(s) (LRB): CORONARY ARTERY BYPASS GRAFTING (CABG) TIMES FOUR: (LIMA to LAD, CRYO VEIN to OM1, SVG to OM2, and SVG to PDA) with PARTIAL EVH/OPEN from RIGHT and LEFT GREATER SAPHENOUS VEIN and LEFT INTERNAL MAMMARY ARTERY (N/A) TRANSESOPHAGEAL ECHOCARDIOGRAM (TEE) (N/A)  Total Length of Stay:  LOS: 6 days   Subjective: Patient sitting in chair. He states he is sleepy this am.  Objective: Vital signs in last 24 hours: Temp:  [98.4 F (36.9 C)-99.5 F (37.5 C)] 98.4 F (36.9 C) (12/12 0755) Cardiac Rhythm: Normal sinus rhythm (12/12 0000) Resp:  [15-27] 19 (12/12 0755) BP: (115-155)/(58-78) 120/67 (12/12 0700) SpO2:  [94 %-100 %] 97 % (12/12 0755) Arterial Line BP: (103-129)/(42-59) 126/51 (12/11 1215) Weight:  [81.2 kg] 81.2 kg (12/12 0500)  Filed Weights   04/01/18 0513 04/02/18 0500 04/03/18 0500  Weight: 74.3 kg 80.4 kg 81.2 kg    Weight change: 0.8 kg   Hemodynamic parameters for last 24 hours: PAP: (15-21)/(2-5) 19/2  Intake/Output from previous day: 12/11 0701 - 12/12 0700 In: 1748.2 [P.O.:720; I.V.:263; Blood:365; IV Piggyback:400.1] Out: 2525 [Urine:1825; Drains:150; Chest Tube:550]  Intake/Output this shift: Total I/O In: 8.9 [I.V.:5.7; IV Piggyback:3.2] Out: 85 [Urine:65; Drains:20]  Current Meds: Scheduled Meds: . acetaminophen  1,000 mg Oral Q6H   Or  . acetaminophen (TYLENOL) oral liquid 160 mg/5 mL  1,000 mg Per Tube Q6H  . atorvastatin  80 mg Oral q1800  . bisacodyl  10 mg Oral Daily   Or  . bisacodyl  10 mg Rectal Daily  . Chlorhexidine Gluconate Cloth  6 each Topical Daily  . docusate sodium  200 mg Oral Daily  . furosemide  40 mg Intravenous BID  . hydrocortisone   Rectal QID  . insulin aspart  0-24 Units Subcutaneous Q4H  . insulin aspart  3 Units Subcutaneous TID WC  . insulin detemir   12 Units Subcutaneous Daily  . mesalamine  2.4 g Oral Q breakfast  . metoCLOPramide (REGLAN) injection  10 mg Intravenous Q6H  . metolazone  5 mg Oral Once  . metoprolol tartrate  12.5 mg Oral BID   Or  . metoprolol tartrate  12.5 mg Per Tube BID  . mupirocin ointment  1 application Nasal BID  . pantoprazole  40 mg Oral Daily  . PARoxetine  20 mg Oral Daily  . sodium chloride flush  10-40 mL Intracatheter Q12H  . sodium chloride flush  3 mL Intravenous Q12H   Continuous Infusions: . sodium chloride    . sodium chloride Stopped (04/03/18 0754)  . cefUROXime (ZINACEF)  IV 200 mL/hr at 04/03/18 0755  . lactated ringers    . milrinone 0.125 mcg/kg/min (04/03/18 0755)  . potassium chloride     PRN Meds:.sodium chloride, levalbuterol, metoprolol tartrate, ondansetron (ZOFRAN) IV, oxyCODONE, potassium chloride, sodium chloride flush, sodium chloride flush, traMADol  General appearance: cooperative and no distress Neurologic: intact Heart: RRR Lungs: Diminshed at bases Abdomen: Soft, non tender, sporadic bowel sounds present Extremities: SCD LLE. Left thigh and RLE ++ edema. Right thigh JP drain with bloody drainage  Wound: Aquacel intact;Bilateral LE dressings are clean and dry  Lab Results: CBC: Recent Labs  04/02/18 1400 04/02/18 1623 04/03/18 0315  WBC 12.7*  --  11.1*  HGB 8.8* 8.5* 8.3*  HCT 26.1* 25.0* 25.2*  PLT 64*  --  66*   BMET:  Recent Labs    04/02/18 0355  04/02/18 1623 04/03/18 0315  NA 139  --  136 135  K 4.0  --  4.0 3.7  CL 110  --  101 104  CO2 24  --   --  23  GLUCOSE 124*  --  135* 139*  BUN 14  --  17 17  CREATININE 1.30*   < > 1.30* 1.35*  CALCIUM 8.2*  --   --  7.9*   < > = values in this interval not displayed.    CMET: Lab Results  Component Value Date   WBC 11.1 (H) 04/03/2018   HGB 8.3 (L) 04/03/2018   HCT 25.2 (L) 04/03/2018   PLT 66 (L) 04/03/2018   GLUCOSE 139 (H) 04/03/2018   CHOL 154 03/28/2018   TRIG 67 03/28/2018    HDL 36 (L) 03/28/2018   LDLCALC 105 (H) 03/28/2018   ALT 22 09/03/2017   AST 17 09/03/2017   NA 135 04/03/2018   K 3.7 04/03/2018   CL 104 04/03/2018   CREATININE 1.35 (H) 04/03/2018   BUN 17 04/03/2018   CO2 23 04/03/2018   TSH 1.707 03/28/2018   INR 1.76 04/01/2018   HGBA1C 6.9 (H) 03/26/2018      PT/INR:  Recent Labs    04/01/18 1538  LABPROT 20.3*  INR 1.76   Radiology: No results found.   Assessment/Plan: S/P Procedure(s) (LRB): CORONARY ARTERY BYPASS GRAFTING (CABG) TIMES FOUR: (LIMA to LAD, CRYO VEIN to OM1, SVG to OM2, and SVG to PDA) with PARTIAL EVH/OPEN from RIGHT and LEFT GREATER SAPHENOUS VEIN and LEFT INTERNAL MAMMARY ARTERY (N/A) TRANSESOPHAGEAL ECHOCARDIOGRAM (TEE) (N/A)  1. CV-First degree heart block, previously A paced.CO/CI 7.3/3.8. On Milrinone drip and this am's co oc up to 73.4. Hope to wean off Milrinone.  Also, on Lopressor 12.5 mg bid 2. Pulmonary-on 2 liters of oxygen via [place]. Wean as able over next few days. Chest tubes with 550 cc output (decreased) last 24 hours. CXR this am shows bibasilar atelectasis and pleural effusions. Encourage incentive spirometer and flutter valve. 3. Volume overload-to be given Metolazone 5 mg and Lasix 40 mg IV bid. Creatinine 1.35 and will monitor. 4. ABL anemia- H and H slightly increased to 8.3 and 25.2 5. DM-CBGs 118/131/153. On Insulin. Pre op HGA1C 6.9. Will restart Glipizide and Metformin once tolerating oral better.  6. Right thigh JP drain with 150 cc (bloody drainage) last 24 hours. To remain for now. 7. Thrombocytopenia-platelets 66,000 this am. Check HIT as admission platelet count was 170,000 8. Supplement potassium   Donielle Liston Alba PA-C 04/03/2018 7:57 AM   Patient needs further diuresis and physical therapy He is very weak and deconditioned Atrial fibrillation onset this afternoon now on IV amiodarone patient examined and medical record reviewed,agree with above note. Tharon Aquas Trigt  III 04/03/2018

## 2018-04-04 ENCOUNTER — Inpatient Hospital Stay (HOSPITAL_COMMUNITY): Payer: Medicare HMO

## 2018-04-04 LAB — TYPE AND SCREEN
ABO/RH(D): O POS
Antibody Screen: NEGATIVE
Unit division: 0
Unit division: 0
Unit division: 0
Unit division: 0
Unit division: 0
Unit division: 0
Unit division: 0

## 2018-04-04 LAB — CBC
HCT: 29.2 % — ABNORMAL LOW (ref 39.0–52.0)
Hemoglobin: 9.7 g/dL — ABNORMAL LOW (ref 13.0–17.0)
MCH: 29.7 pg (ref 26.0–34.0)
MCHC: 33.2 g/dL (ref 30.0–36.0)
MCV: 89.3 fL (ref 80.0–100.0)
Platelets: 100 10*3/uL — ABNORMAL LOW (ref 150–400)
RBC: 3.27 MIL/uL — ABNORMAL LOW (ref 4.22–5.81)
RDW: 15.3 % (ref 11.5–15.5)
WBC: 14.4 10*3/uL — ABNORMAL HIGH (ref 4.0–10.5)
nRBC: 0 % (ref 0.0–0.2)

## 2018-04-04 LAB — BASIC METABOLIC PANEL
Anion gap: 12 (ref 5–15)
BUN: 19 mg/dL (ref 8–23)
CO2: 27 mmol/L (ref 22–32)
Calcium: 8.4 mg/dL — ABNORMAL LOW (ref 8.9–10.3)
Chloride: 92 mmol/L — ABNORMAL LOW (ref 98–111)
Creatinine, Ser: 1.42 mg/dL — ABNORMAL HIGH (ref 0.61–1.24)
GFR calc Af Amer: 57 mL/min — ABNORMAL LOW (ref >60)
GFR calc non Af Amer: 49 mL/min — ABNORMAL LOW (ref >60)
Glucose, Bld: 149 mg/dL — ABNORMAL HIGH (ref 70–99)
Potassium: 3.2 mmol/L — ABNORMAL LOW (ref 3.5–5.1)
Sodium: 131 mmol/L — ABNORMAL LOW (ref 135–145)

## 2018-04-04 LAB — BPAM RBC
Blood Product Expiration Date: 202001042359
Blood Product Expiration Date: 202001052359
Blood Product Expiration Date: 202001072359
Blood Product Expiration Date: 202001082359
Blood Product Expiration Date: 202001082359
Blood Product Expiration Date: 202001082359
Blood Product Expiration Date: 202001082359
ISSUE DATE / TIME: 201912100754
ISSUE DATE / TIME: 201912100754
ISSUE DATE / TIME: 201912101252
ISSUE DATE / TIME: 201912101252
ISSUE DATE / TIME: 201912101700
ISSUE DATE / TIME: 201912110939
Unit Type and Rh: 5100
Unit Type and Rh: 5100
Unit Type and Rh: 5100
Unit Type and Rh: 5100
Unit Type and Rh: 5100
Unit Type and Rh: 5100
Unit Type and Rh: 5100

## 2018-04-04 LAB — GLUCOSE, CAPILLARY
GLUCOSE-CAPILLARY: 178 mg/dL — AB (ref 70–99)
GLUCOSE-CAPILLARY: 166 mg/dL — AB (ref 70–99)
GLUCOSE-CAPILLARY: 115 mg/dL — AB (ref 70–99)
Glucose-Capillary: 140 mg/dL — ABNORMAL HIGH (ref 70–99)
Glucose-Capillary: 175 mg/dL — ABNORMAL HIGH (ref 70–99)
Glucose-Capillary: 117 mg/dL — ABNORMAL HIGH (ref 70–99)
Glucose-Capillary: 166 mg/dL — ABNORMAL HIGH (ref 70–99)
Glucose-Capillary: 151 mg/dL — ABNORMAL HIGH (ref 70–99)

## 2018-04-04 LAB — COOXEMETRY PANEL
Carboxyhemoglobin: 1.4 % (ref 0.5–1.5)
Methemoglobin: 1.2 % (ref 0.0–1.5)
O2 Saturation: 59.2 %
Total hemoglobin: 10.1 g/dL — ABNORMAL LOW (ref 12.0–16.0)

## 2018-04-04 LAB — HEPARIN INDUCED PLATELET AB (HIT ANTIBODY): Heparin Induced Plt Ab: 0.912 OD — ABNORMAL HIGH (ref 0.000–0.400)

## 2018-04-04 MED ORDER — POTASSIUM CHLORIDE CRYS ER 20 MEQ PO TBCR
30.0000 meq | EXTENDED_RELEASE_TABLET | Freq: Every day | ORAL | Status: DC
  Administered 2018-04-04 – 2018-04-07 (×4): 30 meq via ORAL
  Filled 2018-04-04 (×5): qty 1

## 2018-04-04 MED ORDER — CHLORHEXIDINE GLUCONATE 0.12 % MT SOLN
OROMUCOSAL | Status: AC
  Filled 2018-04-04: qty 15

## 2018-04-04 MED ORDER — FUROSEMIDE 10 MG/ML IJ SOLN
40.0000 mg | Freq: Every day | INTRAMUSCULAR | Status: DC
  Administered 2018-04-04 – 2018-04-05 (×2): 40 mg via INTRAVENOUS
  Filled 2018-04-04 (×2): qty 4

## 2018-04-04 MED ORDER — AMIODARONE HCL 200 MG PO TABS
200.0000 mg | ORAL_TABLET | Freq: Two times a day (BID) | ORAL | Status: DC
  Administered 2018-04-04 – 2018-04-08 (×9): 200 mg via ORAL
  Filled 2018-04-04 (×9): qty 1

## 2018-04-04 MED ORDER — POTASSIUM CHLORIDE 10 MEQ/50ML IV SOLN
10.0000 meq | INTRAVENOUS | Status: AC | PRN
  Administered 2018-04-04 (×3): 10 meq via INTRAVENOUS
  Filled 2018-04-04 (×2): qty 50

## 2018-04-04 MED ORDER — ORAL CARE MOUTH RINSE
15.0000 mL | Freq: Two times a day (BID) | OROMUCOSAL | Status: DC
  Administered 2018-04-04 – 2018-04-09 (×9): 15 mL via OROMUCOSAL

## 2018-04-04 MED ORDER — POTASSIUM CHLORIDE CRYS ER 20 MEQ PO TBCR
40.0000 meq | EXTENDED_RELEASE_TABLET | Freq: Once | ORAL | Status: AC
  Administered 2018-04-04: 40 meq via ORAL
  Filled 2018-04-04: qty 2

## 2018-04-04 MED ORDER — ASPIRIN EC 325 MG PO TBEC
325.0000 mg | DELAYED_RELEASE_TABLET | Freq: Every day | ORAL | Status: DC
  Administered 2018-04-04 – 2018-04-08 (×5): 325 mg via ORAL
  Filled 2018-04-04 (×5): qty 1

## 2018-04-04 NOTE — Progress Notes (Addendum)
TCTS BRIEF SICU PROGRESS NOTE  3 Days Post-Op  S/P Procedure(s) (LRB): CORONARY ARTERY BYPASS GRAFTING (CABG) TIMES FOUR: (LIMA to LAD, CRYO VEIN to OM1, SVG to OM2, and SVG to PDA) with PARTIAL EVH/OPEN from RIGHT and LEFT GREATER SAPHENOUS VEIN and LEFT INTERNAL MAMMARY ARTERY (N/A) TRANSESOPHAGEAL ECHOCARDIOGRAM (TEE) (N/A)   Stable day Maintaining NSR w/ stable BP Breathing comfortably w/ O2 sats 100% Diuresing well Heparin antibody + but platelet count up 100k this morning and no signs of bleeding/clotting  Plan: Continue current plan.  Check heparin SRA.  No lovenox/heparin.  Watch platelet count  Rexene Alberts, MD 04/04/2018 6:36 PM

## 2018-04-04 NOTE — Evaluation (Signed)
Physical Therapy Evaluation Patient Details Name: [name] MRN: [id number] DOB: [birth year] Today's Date: 04/04/2018   History of Present Illness  72 y.o. male w/ PMH of CAD, PAD, HTN, HLD, Type 2 DM, depression and intermittent tobacco use who presented to [facility] ED on 03/26/2018 for evaluation of chest pain. transferred to [facility] for Cath. CABG x 4 12/10, intubated-extubated 12/10. CXR Stable bibasilar atelectasis  Clinical Impression  Pt admitted with above diagnosis. Pt currently with functional limitations due to the deficits listed below (see PT Problem List). Pt was able to ambulate on unit with min guard to min assist with Harmon Pier walker.  Anticipate good progress.  Will follow acutely.  Pt will benefit from skilled PT to increase their independence and safety with mobility to allow discharge to the venue listed below.      Follow Up Recommendations Home health PT;Supervision/Assistance - 24 hour    Equipment Recommendations  3in1 (PT)    Recommendations for Other Services       Precautions / Restrictions Precautions Precautions: Fall;Sternal Restrictions Weight Bearing Restrictions: No Other Position/Activity Restrictions: sternal precautions      Mobility  Bed Mobility Overal bed mobility: Needs Assistance Bed Mobility: Rolling;Sidelying to Sit Rolling: Min guard Sidelying to sit: Min guard       General bed mobility comments: Pt was able to come to EOB using sternal precautions wtihout cues. Cues to not use hands to side when scooting to eOB.   Transfers Overall transfer level: Needs assistance Equipment used: Ambulation equipment used(EVA walker) Transfers: Sit to/from Stand Sit to Stand: Min guard         General transfer comment: cues for hand placement on knees  Ambulation/Gait Ambulation/Gait assistance: Min guard;+2 safety/equipment Gait Distance (Feet): 190 Feet Assistive device: (EVA walker) Gait Pattern/deviations: Step-through  pattern;Decreased stride length;Drifts right/left   Gait velocity interpretation: <1.31 ft/sec, indicative of household ambulator General Gait Details: Pt ambulated well with Harmon Pier walker.  Pt did need to incr O2 from 2L to 4L to keep sats >85%.    Stairs            Wheelchair Mobility    Modified Rankin (Stroke Patients Only)       Balance Overall balance assessment: Needs assistance Sitting-balance support: No upper extremity supported;Feet supported Sitting balance-Leahy Scale: Fair     Standing balance support: During functional activity;Bilateral upper extremity supported Standing balance-Leahy Scale: Poor Standing balance comment: relies on UE support                             Pertinent Vitals/Pain Pain Assessment: Faces Faces Pain Scale: Hurts even more Pain Location: incision Pain Descriptors / Indicators: Aching;Grimacing;Guarding Pain Intervention(s): Limited activity within patient's tolerance;Monitored during session;Repositioned    Home Living Family/patient expects to be discharged to:: Private residence Living Arrangements: Spouse/significant other;Children(son and daughter in [employer] [place]) Available Help at Discharge: Family;Available 24 hours/day Type of Home: House Home Access: Stairs to enter Entrance Stairs-Rails: Right;Left;Can reach both Entrance Stairs-Number of Steps: 3 Home Layout: One level Home Equipment: Shower seat;Cane - single point;Walker - 2 wheels      Prior Function Level of Independence: Independent         Comments: Pt states he was mostly inactive but did get around the house     Hand Dominance        Extremity/Trunk Assessment   Upper Extremity Assessment Upper Extremity Assessment: Defer to OT evaluation  Lower Extremity Assessment Lower Extremity Assessment: Generalized weakness    Cervical / Trunk Assessment Cervical / Trunk Assessment: Normal  Communication   Communication: No difficulties   Cognition Arousal/Alertness: Awake/alert Behavior During Therapy: WFL for tasks assessed/performed Overall Cognitive Status: Within Functional Limits for tasks assessed                                        General Comments      Exercises     Assessment/Plan    PT Assessment Patient needs continued PT services  PT Problem List Decreased balance;Decreased mobility;Decreased activity tolerance;Decreased knowledge of use of DME;Decreased safety awareness;Decreased knowledge of precautions;Cardiopulmonary status limiting activity;Pain       PT Treatment Interventions DME instruction;Gait training;Functional mobility training;Stair training;Therapeutic activities;Therapeutic exercise;Balance training;Patient/family education    PT Goals (Current goals can be found in the Care Plan section)  Acute Rehab PT Goals Patient Stated Goal: to go home PT Goal Formulation: With patient Time For Goal Achievement: 04/18/18 Potential to Achieve Goals: Good    Frequency Min 3X/week   Barriers to discharge        Co-evaluation               AM-PAC PT "6 Clicks" Mobility  Outcome Measure Help needed turning from your back to your side while in a flat bed without using bedrails?: A Little Help needed moving from lying on your back to sitting on the side of a flat bed without using bedrails?: A Little Help needed moving to and from a bed to a chair (including a wheelchair)?: A Little Help needed standing up from a chair using your arms (e.g., wheelchair or bedside chair)?: A Little Help needed to walk in hospital room?: A Little Help needed climbing 3-5 steps with a railing? : A Little 6 Click Score: 18    End of Session Equipment Utilized During Treatment: Gait belt;Oxygen Activity Tolerance: Patient limited by fatigue Patient left: in chair;with call bell/phone within reach Nurse Communication: Mobility status PT Visit Diagnosis: Muscle weakness (generalized)  (M62.81);Unsteadiness on feet (R26.81)    Time: 6433-2951 PT Time Calculation (min) (ACUTE ONLY): 24 min   Charges:   PT Evaluation $PT Eval Moderate Complexity: 1 Mod PT Treatments $Gait Training: 8-22 mins        [place] Pager:  [phone]  Office:  [place] 04/04/2018, 2:08 PM

## 2018-04-04 NOTE — Evaluation (Signed)
Clinical/Bedside Swallow Evaluation Patient Details  Name: [name] MRN: [id number] Date of Birth: 1945/10/13  Today's Date: 04/04/2018 Time: SLP Start Time (ACUTE ONLY): 0913 SLP Stop Time (ACUTE ONLY): 0926 SLP Time Calculation (min) (ACUTE ONLY): 13 min  Past Medical History:  Past Medical History:  Diagnosis Date  . Adenocarcinoma of prostate ([place])   . Anxiety   . CAD (coronary artery disease)    a. s/p prior stenting of LAD in 1996 and angioplasty alone in 1999 to LAD by review of prior notes.   . 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 Crohn's disease ([place])   . CTS (carpal tunnel syndrome)    Mild  . Depression   . Diabetes mellitus without complication (HCC)    Type 2  . Hyperlipidemia   . Hypertension   . Kidney stone   . Optic neuritis    Past Surgical History:  Past Surgical History:  Procedure Laterality Date  . COLONOSCOPY    . CORONARY ARTERY BYPASS GRAFT N/A 04/01/2018   Procedure: CORONARY ARTERY BYPASS GRAFTING (CABG) TIMES FOUR: (LIMA to LAD, CRYO VEIN to OM1, SVG to OM2, and SVG to PDA) with PARTIAL EVH/OPEN from RIGHT and LEFT GREATER SAPHENOUS VEIN and LEFT INTERNAL MAMMARY ARTERY;  Surgeon: [name], MD;  Location: [facility];  Service: Open Heart Surgery;  Laterality: N/A;  . KNEE CARTILAGE SURGERY Right   . LEFT HEART CATH AND CORONARY ANGIOGRAPHY N/A 03/27/2018   Procedure: LEFT HEART CATH AND CORONARY ANGIOGRAPHY;  Surgeon: [name], MD;  Location: [facility];  Service: Cardiovascular;  Laterality: N/A;  . LOWER EXTREMITY ANGIOGRAPHY N/A 08/13/2016   Procedure: Lower Extremity Angiography;  Surgeon: [name], MD;  Location: [facility] CV LAB;  Service: Cardiovascular;  Laterality: N/A;  . NASAL SINUS SURGERY    . PROSTATE SURGERY    . TEE WITHOUT CARDIOVERSION N/A 04/01/2018   Procedure: TRANSESOPHAGEAL ECHOCARDIOGRAM (TEE);  Surgeon: [name], [name], MD;  Location: [facility];  Service: Open Heart Surgery;  Laterality: N/A;    HPI:  72 y.o. male w/ PMH of CAD, PAD, HTN, HLD, Type 2 DM, depression and intermittent tobacco use who presented to [facility] ED on 03/26/2018 for evaluation of chest pain. transferred to [facility] for Cath. CABG x 4 12/10, intubated-extubated 12/10. CXR Stable bibasilar atelectasis.   Assessment / Plan / Recommendation Clinical Impression  Pt affirms odonophagia post extubation with intubation only needed during surgery and removed same day. Obseved with 3 oz water, solid and pills with liquid resulting in one mild throat clear with pills. Missing majority of dentition but states no difficulty pre hospital and masticated within functional limits. Upgraded to regular/thin, pills whole in applesauce if large. No ST follow up needed.  SLP Visit Diagnosis: Dysphagia, unspecified (R13.10)    Aspiration Risk  Mild aspiration risk    Diet Recommendation Regular;Thin liquid   Liquid Administration via: Cup;Straw Medication Administration: Whole meds with liquid(large with applesauce) Supervision: Patient able to self feed Compensations: Slow rate;Small sips/bites Postural Changes: Seated upright at 90 degrees    Other  Recommendations Oral Care Recommendations: Oral care BID   Follow up Recommendations None      Frequency and Duration            Prognosis        Swallow Study   General HPI: 72 y.o. male w/ PMH of CAD, PAD, HTN, HLD, Type 2 DM, depression and intermittent tobacco use who presented  to [facility] ED on 03/26/2018 for evaluation of chest pain. transferred to [facility] for Cath. CABG x 4 12/10, intubated-extubated 12/10. CXR Stable bibasilar atelectasis. Type of Study: Bedside Swallow Evaluation Previous Swallow Assessment: (none) Diet Prior to this Study: Thin liquids(clears) Temperature Spikes Noted: No Respiratory Status: Nasal cannula History of Recent Intubation: Yes Length of Intubations (days): (hours during surgery) Date extubated: 04/01/18 Behavior/Cognition:  Alert;Cooperative;Pleasant mood(HOH) Oral Cavity Assessment: Other (comment)(lingual candidias) Oral Care Completed by SLP: No Oral Cavity - Dentition: Poor condition;Missing dentition(no dentures or partials) Vision: Functional for self-feeding Self-Feeding Abilities: Able to feed self Patient Positioning: Upright in bed Baseline Vocal Quality: Normal Volitional Cough: Strong Volitional Swallow: Able to elicit    Oral/Motor/Sensory Function Overall Oral Motor/Sensory Function: Within functional limits   Ice Chips Ice chips: Not tested   Thin Liquid Thin Liquid: Impaired Presentation: Straw Pharyngeal  Phase Impairments: Cough - Delayed(when taking pill)    Nectar Thick Nectar Thick Liquid: Not tested   Honey Thick Honey Thick Liquid: Not tested   Puree Puree: Within functional limits   Solid     Solid: Within functional limits      Houston Siren 04/04/2018,9:40 AM  Orbie Pyo Colvin Caroli.Ed Risk analyst [phone] Office [phone]

## 2018-04-04 NOTE — Progress Notes (Signed)
Pharmacy Heparin Induced Thrombocytopenia (HIT) Note:  [name] is a 72 y.o. male being evaluated for HIT. Heparin was started 12/5 for r/o ACS, and baseline platelets were 170. HIT labs were ordered on 12/12 when platelets dropped to 66.  -heparin was running from 12/5>> 1/10 with CABG on 12/10  Heparin Induced Plt Ab  Date/Time Value Ref Range Status  04/03/2018 09:19 AM 0.912 (H) 0.000 - 0.400 OD Final    Comment:    (NOTE) Performed At: [facility] [place], [place] [id number] Rush Farmer MD [id number]      Score = 0 Score = 1 Score = 2 Calculated Score  Thrombocytopenia -Platelet fall < 30% -Any platelet fall with nadir < 10 -Platelet fall >50% BUT surgery within 3 days  -Any combination of platelet fall/nadir that does not fit score 0 or 2 -Platelet fall >50% AND nadir ?20 AND no surgery within 3 days     2  Timing -Platelets fall on days ? 4  AND no prior heparin exposure in previous 100 days -Consistent w/ platelet fall on days 5-10 but missing counts -Platelet fall within 1 day of start AND prior exposure to heparin within previous 31-100 days -Platelet fall after day 10 -Platelet fall 5-10 days after starting heparin -Platelet fall within 1 day of heparin start AND prior exposure within previous 5-30 days 2  Thrombosis -None suspected -Recurrent thrombosis in patient receiving therapeutic anticoagulants -Suspected thrombosis (awaiting confirmation via imaging) -Erythematous skin lesions at heparin injection sites -Confirmed new thrombosis -Skin necrosis at injection site -Anaphylactoid reaction to heparin IV bolus -Adrenal hemorrhage 0  oTher Causes -Probable other cause (see protocol for full list) -Possible other cause evident (see protocol for full list) -No alternative explanation 1  Total Calculated 4T Score:      5   Name of MD Contacted: Dr. Roxy Manns  Algorithm Recommendation: Possible HIT:  Order SRA.  Initiate alternative  anticoagulant.  Document heparin allergy.  Plan: -Discusses with Dr. Roxy Manns. No anticoagulation now -Order SRA  Hildred Laser, PharmD Clinical Pharmacist **Pharmacist phone directory can now be found on [email or web address] ([email or web address]).  Listed under [place].

## 2018-04-04 NOTE — Progress Notes (Addendum)
TCTS DAILY ICU PROGRESS NOTE                   [address]            [place] [address]          [phone]   3 Days Post-Op Procedure(s) (LRB): CORONARY ARTERY BYPASS GRAFTING (CABG) TIMES FOUR: (LIMA to LAD, CRYO VEIN to OM1, SVG to OM2, and SVG to PDA) with PARTIAL EVH/OPEN from RIGHT and LEFT GREATER SAPHENOUS VEIN and LEFT INTERNAL MAMMARY ARTERY (N/A) TRANSESOPHAGEAL ECHOCARDIOGRAM (TEE) (N/A)  Total Length of Stay:  LOS: 7 days   Subjective: Patient sitting in chair. He states this has been rough.  Objective: Vital signs in last 24 hours: Temp:  [97.7 F (36.5 C)-98.7 F (37.1 C)] 98.7 F (37.1 C) (12/13 0400) Pulse Rate:  [102] 102 (12/12 0908) Cardiac Rhythm: Normal sinus rhythm (12/12 2000) Resp:  [15-33] 27 (12/13 0600) BP: (109-157)/(60-86) 124/64 (12/13 0600) SpO2:  [91 %-100 %] 91 % (12/13 0600)  Filed Weights   04/01/18 0513 04/02/18 0500 04/03/18 0500  Weight: 74.3 kg 80.4 kg 81.2 kg       Intake/Output from previous day: 12/12 0701 - 12/13 0700 In: 825.8 [I.V.:569; IV Piggyback:256.8] Out: 5250 [Urine:5030; Drains:120; Chest Tube:100]  Intake/Output this shift: Total I/O In: 458.2 [I.V.:320.2; IV Piggyback:138] Out: 6789 [FYBOF:7510; Drains:30]  Current Meds: Scheduled Meds: . acetaminophen  1,000 mg Oral Q6H   Or  . acetaminophen (TYLENOL) oral liquid 160 mg/5 mL  1,000 mg Per Tube Q6H  . atorvastatin  80 mg Oral q1800  . bisacodyl  10 mg Oral Daily   Or  . bisacodyl  10 mg Rectal Daily  . Chlorhexidine Gluconate Cloth  6 each Topical Daily  . docusate sodium  200 mg Oral Daily  . furosemide  40 mg Intravenous BID  . guaiFENesin  600 mg Oral BID  . hydrocortisone   Rectal QID  . insulin aspart  0-24 Units Subcutaneous Q4H  . insulin aspart  3 Units Subcutaneous TID WC  . insulin detemir  12 Units Subcutaneous Daily  . mesalamine  2.4 g Oral Q breakfast  . metoCLOPramide (REGLAN) injection  10 mg Intravenous Q6H  .  metoprolol tartrate  25 mg Oral BID  . mupirocin ointment  1 application Nasal BID  . pantoprazole  40 mg Oral Daily  . PARoxetine  20 mg Oral Daily  . sodium chloride flush  10-40 mL Intracatheter Q12H  . sodium chloride flush  3 mL Intravenous Q12H   Continuous Infusions: . sodium chloride    . sodium chloride Stopped (04/03/18 1621)  . amiodarone 30 mg/hr (04/04/18 0600)  . lactated ringers    . milrinone 0.125 mcg/kg/min (04/04/18 0600)   PRN Meds:.sodium chloride, hydrOXYzine, levalbuterol, metoprolol tartrate, ondansetron (ZOFRAN) IV, oxyCODONE, sodium chloride flush, sodium chloride flush, traMADol  General appearance: cooperative and no distress Neurologic: intact Heart: RRR Lungs: Diminshed at bases Abdomen: Soft, non tender, sporadic bowel sounds present Extremities: SCD LLE. Left thigh and RLE ++ edema. Right thigh JP drain with bloody drainage  Wound: Aquacel intact;Bilateral LE dressings are clean and dry  Lab Results: CBC: Recent Labs    04/03/18 0315 04/04/18 0500  WBC 11.1* 14.4*  HGB 8.3* 9.7*  HCT 25.2* 29.2*  PLT 66* 100*   BMET:  Recent Labs    04/03/18 1600 04/04/18 0500  NA 131* 131*  K 3.3* 3.2*  CL 93* 92*  CO2  25 27  GLUCOSE 180* 149*  BUN 17 19  CREATININE 1.29* 1.42*  CALCIUM 8.5* 8.4*    CMET: Lab Results  Component Value Date   WBC 14.4 (H) 04/04/2018   HGB 9.7 (L) 04/04/2018   HCT 29.2 (L) 04/04/2018   PLT 100 (L) 04/04/2018   GLUCOSE 149 (H) 04/04/2018   CHOL 154 03/28/2018   TRIG 67 03/28/2018   HDL 36 (L) 03/28/2018   LDLCALC 105 (H) 03/28/2018   ALT 22 09/03/2017   AST 17 09/03/2017   NA 131 (L) 04/04/2018   K 3.2 (L) 04/04/2018   CL 92 (L) 04/04/2018   CREATININE 1.42 (H) 04/04/2018   BUN 19 04/04/2018   CO2 27 04/04/2018   TSH 1.707 03/28/2018   INR 1.76 04/01/2018   HGBA1C 6.9 (H) 03/26/2018    PT/INR:  Recent Labs    04/01/18 1538  LABPROT 20.3*  INR 1.76   Radiology: No results  found.   Assessment/Plan: S/P Procedure(s) (LRB): CORONARY ARTERY BYPASS GRAFTING (CABG) TIMES FOUR: (LIMA to LAD, CRYO VEIN to OM1, SVG to OM2, and SVG to PDA) with PARTIAL EVH/OPEN from RIGHT and LEFT GREATER SAPHENOUS VEIN and LEFT INTERNAL MAMMARY ARTERY (N/A) TRANSESOPHAGEAL ECHOCARDIOGRAM (TEE) (N/A)  1. CV-First degree heart block, previously A paced. On Milrinone drip 0.125 mcg/kg/min and this am's co ox decreased to 59.2.  Also, on Lopressor 25 mg bid 2. Pulmonary-on 2 liters of oxygen via [place]. Wean as able over next few days. Chest tubes with 100 cc output (decreased) last 24 hours. CXR this am shows bibasilar atelectasis and pleural effusions. Encourage incentive spirometer and flutter valve. 3. Volume overload-Had very good diuresis yesterday. On Lasix 40 mg IV bid. Creatinine slightly increased to 1.42 so will decrease Lasix 40 mg IV to once daily for today 4. ABL anemia- H and H increased to 9.7 and 29.2 5. DM-CBGs 118/131/153. On Insulin. Pre op HGA1C 6.9. Will restart Glipizide and Metformin once tolerating oral better.  6. Right thigh JP drain with 120 cc (bloody drainage) last 24 hours. To remain for now. 7. Thrombocytopenia-platelets increased to 100,000 this am. Await HIT as admission platelet count was 170,000. Will start ecasa 8. Supplement potassium 9. Hyponatremia-sodium 131. Likely related to diuresis. Monitor 10. Will discuss with Dr. [name] if should remove foley (pod # 2) as is being diuresed and creatinine slightly increased 11. Deconditioned-PT  Donielle Liston Alba PA-C 04/04/2018 6:20 AM   Remove central line and transition to po amiodarone patient examined and medical record reviewed,agree with above note. Tharon Aquas Trigt III 04/04/2018

## 2018-04-05 ENCOUNTER — Inpatient Hospital Stay (HOSPITAL_COMMUNITY): Payer: Medicare HMO

## 2018-04-05 DIAGNOSIS — Z951 Presence of aortocoronary bypass graft: Secondary | ICD-10-CM

## 2018-04-05 LAB — CBC
HCT: 29.9 % — ABNORMAL LOW (ref 39.0–52.0)
Hemoglobin: 9.9 g/dL — ABNORMAL LOW (ref 13.0–17.0)
MCH: 29.6 pg (ref 26.0–34.0)
MCHC: 33.1 g/dL (ref 30.0–36.0)
MCV: 89.5 fL (ref 80.0–100.0)
PLATELETS: 136 10*3/uL — AB (ref 150–400)
RBC: 3.34 MIL/uL — ABNORMAL LOW (ref 4.22–5.81)
RDW: 15.1 % (ref 11.5–15.5)
WBC: 14.9 10*3/uL — ABNORMAL HIGH (ref 4.0–10.5)
nRBC: 0 % (ref 0.0–0.2)

## 2018-04-05 LAB — GLUCOSE, CAPILLARY
GLUCOSE-CAPILLARY: 178 mg/dL — AB (ref 70–99)
Glucose-Capillary: 68 mg/dL — ABNORMAL LOW (ref 70–99)
Glucose-Capillary: 142 mg/dL — ABNORMAL HIGH (ref 70–99)
Glucose-Capillary: 114 mg/dL — ABNORMAL HIGH (ref 70–99)
Glucose-Capillary: 126 mg/dL — ABNORMAL HIGH (ref 70–99)

## 2018-04-05 LAB — BASIC METABOLIC PANEL
Anion gap: 12 (ref 5–15)
BUN: 21 mg/dL (ref 8–23)
CO2: 26 mmol/L (ref 22–32)
CREATININE: 1.45 mg/dL — AB (ref 0.61–1.24)
Calcium: 8.6 mg/dL — ABNORMAL LOW (ref 8.9–10.3)
Chloride: 93 mmol/L — ABNORMAL LOW (ref 98–111)
GFR calc Af Amer: 55 mL/min — ABNORMAL LOW (ref >60)
GFR calc non Af Amer: 48 mL/min — ABNORMAL LOW (ref >60)
Glucose, Bld: 73 mg/dL (ref 70–99)
Potassium: 3.6 mmol/L (ref 3.5–5.1)
Sodium: 131 mmol/L — ABNORMAL LOW (ref 135–145)

## 2018-04-05 MED ORDER — SODIUM CHLORIDE 0.9% FLUSH: 3.0000 mL | INTRAVENOUS | Status: DC | PRN

## 2018-04-05 MED ORDER — MOVING RIGHT ALONG BOOK
Freq: Once | Status: AC
  Administered 2018-04-05: 13:00:00
  Filled 2018-04-05: qty 1

## 2018-04-05 MED ORDER — INSULIN ASPART 100 UNIT/ML ~~LOC~~ SOLN
0.0000 [IU] | Freq: Three times a day (TID) | SUBCUTANEOUS | Status: DC
  Administered 2018-04-05: 4 [IU] via SUBCUTANEOUS
  Administered 2018-04-05 – 2018-04-07 (×6): 2 [IU] via SUBCUTANEOUS
  Administered 2018-04-07: 3 [IU] via SUBCUTANEOUS
  Administered 2018-04-08 (×3): 2 [IU] via SUBCUTANEOUS
  Administered 2018-04-08: 4 [IU] via SUBCUTANEOUS
  Administered 2018-04-09: 2 [IU] via SUBCUTANEOUS

## 2018-04-05 MED ORDER — POTASSIUM CHLORIDE CRYS ER 20 MEQ PO TBCR
40.0000 meq | EXTENDED_RELEASE_TABLET | Freq: Once | ORAL | Status: AC
  Administered 2018-04-05: 40 meq via ORAL
  Filled 2018-04-05: qty 2

## 2018-04-05 MED ORDER — SODIUM CHLORIDE 0.9% FLUSH
3.0000 mL | Freq: Two times a day (BID) | INTRAVENOUS | Status: DC
  Administered 2018-04-05 – 2018-04-08 (×6): 3 mL via INTRAVENOUS

## 2018-04-05 MED ORDER — SODIUM CHLORIDE 0.9 % IV SOLN: 250.0000 mL | INTRAVENOUS | Status: DC | PRN

## 2018-04-05 MED ORDER — POTASSIUM CHLORIDE 10 MEQ/100ML IV SOLN
INTRAVENOUS | Status: AC
  Administered 2018-04-05: 10 meq
  Filled 2018-04-05: qty 200

## 2018-04-05 NOTE — Progress Notes (Signed)
Patient ambulated 130ft in the hallway using front wheel walker. Tolerated well. This is the third walk of the day. Will continue to monitor. Lajoyce Corners, RN

## 2018-04-05 NOTE — Progress Notes (Signed)
[address]       [place] [address]             [phone]        CARDIOTHORACIC SURGERY PROGRESS NOTE   R4 Days Post-Op Procedure(s) (LRB): CORONARY ARTERY BYPASS GRAFTING (CABG) TIMES FOUR: (LIMA to LAD, CRYO VEIN to OM1, SVG to OM2, and SVG to PDA) with PARTIAL EVH/OPEN from RIGHT and LEFT GREATER SAPHENOUS VEIN and LEFT INTERNAL MAMMARY ARTERY (N/A) TRANSESOPHAGEAL ECHOCARDIOGRAM (TEE) (N/A)  Subjective: Feels okay.  Weak.  Some urinary incontinence.  Objective: Vital signs: BP Readings from Last 1 Encounters:  04/05/18 (!) 115/100   Pulse Readings from Last 1 Encounters:  04/03/18 (!) 102   Resp Readings from Last 1 Encounters:  04/05/18 (!) 23   Temp Readings from Last 1 Encounters:  04/05/18 97.8 F (36.6 C) (Oral)    Hemodynamics:    Physical Exam:  Rhythm:   sinus  Breath sounds: clear  Heart sounds:  RRR  Incisions:  Dressing dry, intact  Abdomen:  Soft, non-distended, non-tender  Extremities:  Warm, well-perfused    Intake/Output from previous day: 12/13 0701 - 12/14 0700 In: 412.9 [I.V.:162.9; IV Piggyback:250] Out: 2538 [Urine:2500; Drains:38] Intake/Output this shift: Total I/O In: 0  Out: 10 [Drains:10]  Lab Results:  CBC: Recent Labs    04/04/18 0500 04/05/18 0325  WBC 14.4* 14.9*  HGB 9.7* 9.9*  HCT 29.2* 29.9*  PLT 100* 136*    BMET:  Recent Labs    04/04/18 0500 04/05/18 0325  NA 131* 131*  K 3.2* 3.6  CL 92* 93*  CO2 27 26  GLUCOSE 149* 73  BUN 19 21  CREATININE 1.42* 1.45*  CALCIUM 8.4* 8.6*     PT/INR:  No results for input(s): LABPROT, INR in the last 72 hours.  CBG (last 3)  Recent Labs    04/04/18 1925 04/04/18 2325 04/05/18 0406  GLUCAP 166* 151* 68*    ABG    Component Value Date/Time   PHART 7.288 (L) 04/01/2018 2341   PCO2ART 44.5 04/01/2018 2341   PO2ART 137.0 (H) 04/01/2018 2341   HCO3 21.2 04/01/2018 2341   TCO2 22 04/02/2018 1623   ACIDBASEDEF 5.0 (H)  04/01/2018 2341   O2SAT 59.2 04/04/2018 0450    CXR: PORTABLE CHEST 1 VIEW  COMPARISON:  Radiograph of April 04, 2018.  FINDINGS: Stable cardiomegaly. Status post coronary bypass graft. No pneumothorax is noted. Right lung is clear. Minimal left pleural effusion is noted. No consolidative process is noted. Bony thorax is unremarkable.  IMPRESSION: Minimal left pleural effusion.  Stable cardiomegaly.   Electronically Signed   By: Marijo Conception, M.D.   On: 04/05/2018 09:52  Assessment/Plan: S/P Procedure(s) (LRB): CORONARY ARTERY BYPASS GRAFTING (CABG) TIMES FOUR: (LIMA to LAD, CRYO VEIN to OM1, SVG to OM2, and SVG to PDA) with PARTIAL EVH/OPEN from RIGHT and LEFT GREATER SAPHENOUS VEIN and LEFT INTERNAL MAMMARY ARTERY (N/A) TRANSESOPHAGEAL ECHOCARDIOGRAM (TEE) (N/A)  Overall stable POD4 Maintaining NSR w/ stable BP Breathing comfortably w/ O2 sats 98% on room air Expected post op volume excess, weight not recorded for several days Expected post op acute blood loss anemia, stable Post op thrombocytopenia, heparin Ab positive, platelet count improving, no clinical signs of HITT Type II diabetes mellitus, excellent glycemic control   Mobilize  PT consult  Diuresis  Check daily weights as per routine  Watch platelet count and f/u SRA results, no lovenox/heparin  Change CBG's  and SSI to ac/hs  Transfer 4E    Rexene Alberts, MD 04/05/2018 11:00 AM

## 2018-04-05 NOTE — Progress Notes (Signed)
Progress Note  Patient Name: [name] Date of Encounter: 04/05/2018  Primary Cardiologist: Peter Martinique, MD   Subjective   Sitting in chair, comfortable. No CP, no SOB  Inpatient Medications    Scheduled Meds: . acetaminophen  1,000 mg Oral Q6H   Or  . acetaminophen (TYLENOL) oral liquid 160 mg/5 mL  1,000 mg Per Tube Q6H  . amiodarone  200 mg Oral BID  . aspirin EC  325 mg Oral Daily  . atorvastatin  80 mg Oral q1800  . bisacodyl  10 mg Oral Daily   Or  . bisacodyl  10 mg Rectal Daily  . chlorhexidine      . Chlorhexidine Gluconate Cloth  6 each Topical Daily  . docusate sodium  200 mg Oral Daily  . furosemide  40 mg Intravenous Daily  . guaiFENesin  600 mg Oral BID  . hydrocortisone   Rectal QID  . insulin aspart  0-24 Units Subcutaneous Q4H  . insulin aspart  3 Units Subcutaneous TID WC  . insulin detemir  12 Units Subcutaneous Daily  . mouth rinse  15 mL Mouth Rinse BID  . mesalamine  2.4 g Oral Q breakfast  . metoprolol tartrate  25 mg Oral BID  . mupirocin ointment  1 application Nasal BID  . pantoprazole  40 mg Oral Daily  . PARoxetine  20 mg Oral Daily  . potassium chloride  30 mEq Oral Daily  . sodium chloride flush  10-40 mL Intracatheter Q12H  . sodium chloride flush  3 mL Intravenous Q12H   Continuous Infusions: . sodium chloride    . sodium chloride Stopped (04/03/18 1621)  . lactated ringers    . milrinone Stopped (04/04/18 1122)   PRN Meds: sodium chloride, hydrOXYzine, levalbuterol, metoprolol tartrate, ondansetron (ZOFRAN) IV, oxyCODONE, sodium chloride flush, sodium chloride flush, traMADol   Vital Signs    Vitals:   04/05/18 0500 04/05/18 0600 04/05/18 0700 04/05/18 0800  BP: (!) 108/54 130/77 121/65 (!) 115/100  Pulse:      Resp: 14 19 18  (!) 23  Temp:   97.8 F (36.6 C)   TempSrc:   Oral   SpO2: 96% 95% 97% 97%  Weight:      Height:        Intake/Output Summary (Last 24 hours) at 04/05/2018 0900 Last data filed  at 04/05/2018 0800 Gross per 24 hour  Intake 362.85 ml  Output 2538 ml  Net -2175.15 ml   Filed Weights   04/01/18 0513 04/02/18 0500 04/03/18 0500  Weight: 74.3 kg 80.4 kg 81.2 kg    Telemetry    SR - Personally Reviewed  ECG    No new - Personally Reviewed  Physical Exam   GEN: No acute distress.   Neck: No JVD, right IJ Neuro:  Nonfocal  Psych: Normal affect   Labs    Chemistry Recent Labs  Lab 04/03/18 1600 04/04/18 0500 04/05/18 0325  NA 131* 131* 131*  K 3.3* 3.2* 3.6  CL 93* 92* 93*  CO2 25 27 26   GLUCOSE 180* 149* 73  BUN 17 19 21   CREATININE 1.29* 1.42* 1.45*  CALCIUM 8.5* 8.4* 8.6*  ALBUMIN 3.6  --   --   GFRNONAA 55* 49* 48*  GFRAA >60 57* 55*  ANIONGAP 13 12 12      Hematology Recent Labs  Lab 04/03/18 0315 04/04/18 0500 04/05/18 0325  WBC 11.1* 14.4* 14.9*  RBC 2.77* 3.27* 3.34*  HGB 8.3* 9.7* 9.9*  HCT  25.2* 29.2* 29.9*  MCV 91.0 89.3 89.5  MCH 30.0 29.7 29.6  MCHC 32.9 33.2 33.1  RDW 15.8* 15.3 15.1  PLT 66* 100* 136*    Cardiac EnzymesNo results for input(s): TROPONINI in the last 168 hours. No results for input(s): TROPIPOC in the last 168 hours.   BNPNo results for input(s): BNP, PROBNP in the last 168 hours.   DDimer No results for input(s): DDIMER in the last 168 hours.   Radiology    Dg Chest Port 1 View  Result Date: 04/04/2018 CLINICAL DATA:  Follow-up coronary bypass grafting EXAM: PORTABLE CHEST 1 VIEW COMPARISON:  04/01/2011 FINDINGS: Cardiac shadow remains enlarged. Postsurgical changes are again seen. Right jugular sheath is again noted. Bibasilar atelectatic changes are again seen. Left thoracostomy catheter has been removed without evidence pneumothorax. Mediastinal drain is been removed as well. IMPRESSION: No pneumothorax following chest tube removal. Stable bibasilar atelectasis. Electronically Signed   By: Inez Catalina M.D.   On: 04/04/2018 08:21    Cardiac Studies   Pre op ECHO 65% EF  Patient Profile      72 y.o. 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   CABG CAD  - stable. Progressing      For questions or updates, please contact [place] Please consult [email or web address] for contact info under        Signed, Candee Furbish, MD  04/05/2018, 9:00 AM

## 2018-04-06 ENCOUNTER — Inpatient Hospital Stay (HOSPITAL_COMMUNITY): Payer: Medicare HMO

## 2018-04-06 LAB — BASIC METABOLIC PANEL
Anion gap: 15 (ref 5–15)
BUN: 30 mg/dL — ABNORMAL HIGH (ref 8–23)
CALCIUM: 8.6 mg/dL — AB (ref 8.9–10.3)
CO2: 26 mmol/L (ref 22–32)
Chloride: 91 mmol/L — ABNORMAL LOW (ref 98–111)
Creatinine, Ser: 1.6 mg/dL — ABNORMAL HIGH (ref 0.61–1.24)
GFR calc Af Amer: 49 mL/min — ABNORMAL LOW (ref >60)
GFR calc non Af Amer: 42 mL/min — ABNORMAL LOW (ref >60)
Glucose, Bld: 121 mg/dL — ABNORMAL HIGH (ref 70–99)
Potassium: 4.2 mmol/L (ref 3.5–5.1)
Sodium: 132 mmol/L — ABNORMAL LOW (ref 135–145)

## 2018-04-06 LAB — CBC
HCT: 30.9 % — ABNORMAL LOW (ref 39.0–52.0)
Hemoglobin: 10.2 g/dL — ABNORMAL LOW (ref 13.0–17.0)
MCH: 29.7 pg (ref 26.0–34.0)
MCHC: 33 g/dL (ref 30.0–36.0)
MCV: 89.8 fL (ref 80.0–100.0)
Platelets: 191 10*3/uL (ref 150–400)
RBC: 3.44 MIL/uL — ABNORMAL LOW (ref 4.22–5.81)
RDW: 14.7 % (ref 11.5–15.5)
WBC: 11.8 10*3/uL — ABNORMAL HIGH (ref 4.0–10.5)
nRBC: 0 % (ref 0.0–0.2)

## 2018-04-06 LAB — GLUCOSE, CAPILLARY: GLUCOSE-CAPILLARY: 129 mg/dL — AB (ref 70–99)

## 2018-04-06 NOTE — Progress Notes (Addendum)
[name]       [place] [address]             [phone]      5 Days Post-Op Procedure(s) (LRB): CORONARY ARTERY BYPASS GRAFTING (CABG) TIMES FOUR: (LIMA to LAD, CRYO VEIN to OM1, SVG to OM2, and SVG to PDA) with PARTIAL EVH/OPEN from RIGHT and LEFT GREATER SAPHENOUS VEIN and LEFT INTERNAL MAMMARY ARTERY (N/A) TRANSESOPHAGEAL ECHOCARDIOGRAM (TEE) (N/A) Subjective: Feels much more fatigued today  Objective: Vital signs in last 24 hours: Temp:  [97.6 F (36.4 C)-98 F (36.7 C)] 97.7 F (36.5 C) (12/15 0820) Pulse Rate:  [67-94] 91 (12/15 0820) Cardiac Rhythm: Normal sinus rhythm (12/15 0700) Resp:  [15-24] 15 (12/15 0820) BP: (105-133)/(45-76) 125/68 (12/15 0820) SpO2:  [90 %-100 %] 96 % (12/15 0820) Weight:  [72.2 kg] 72.2 kg (12/15 0546)  Hemodynamic parameters for last 24 hours:    Intake/Output from previous day: 12/14 0701 - 12/15 0700 In: 3.2 [I.V.:3.2] Out: 2190 [Urine:2150; Drains:40] Intake/Output this shift: Total I/O In: -  Out: 250 [Urine:250]  General appearance: alert, cooperative and no distress Heart: regular rate and rhythm Lungs: mildly dim in bases Abdomen: soft, nontender Extremities: no edema Wound: incis healing well  Lab Results: Recent Labs    04/05/18 0325 04/06/18 0253  WBC 14.9* 11.8*  HGB 9.9* 10.2*  HCT 29.9* 30.9*  PLT 136* 191   BMET:  Recent Labs    04/05/18 0325 04/06/18 0253  NA 131* 132*  K 3.6 4.2  CL 93* 91*  CO2 26 26  GLUCOSE 73 121*  BUN 21 30*  CREATININE 1.45* 1.60*  CALCIUM 8.6* 8.6*    PT/INR: No results for input(s): LABPROT, INR in the last 72 hours. ABG    Component Value Date/Time   PHART 7.288 (L) 04/01/2018 2341   HCO3 21.2 04/01/2018 2341   TCO2 22 04/02/2018 1623   ACIDBASEDEF 5.0 (H) 04/01/2018 2341   O2SAT 59.2 04/04/2018 0450   CBG (last 3)  Recent Labs    04/05/18 1729 04/05/18 2117 04/06/18 0700  GLUCAP 178* 126* 129*    Meds Scheduled Meds: .  acetaminophen  1,000 mg Oral Q6H  . amiodarone  200 mg Oral BID  . aspirin EC  325 mg Oral Daily  . atorvastatin  80 mg Oral q1800  . bisacodyl  10 mg Oral Daily   Or  . bisacodyl  10 mg Rectal Daily  . Chlorhexidine Gluconate Cloth  6 each Topical Daily  . docusate sodium  200 mg Oral Daily  . guaiFENesin  600 mg Oral BID  . hydrocortisone   Rectal QID  . insulin aspart  0-24 Units Subcutaneous TID AC & HS  . insulin aspart  3 Units Subcutaneous TID WC  . insulin detemir  12 Units Subcutaneous Daily  . mouth rinse  15 mL Mouth Rinse BID  . mesalamine  2.4 g Oral Q breakfast  . metoprolol tartrate  25 mg Oral BID  . pantoprazole  40 mg Oral Daily  . PARoxetine  20 mg Oral Daily  . potassium chloride  30 mEq Oral Daily  . sodium chloride flush  10-40 mL Intracatheter Q12H  . sodium chloride flush  3 mL Intravenous Q12H  . sodium chloride flush  3 mL Intravenous Q12H   Continuous Infusions: . sodium chloride    . sodium chloride Stopped (04/03/18 1621)  . sodium chloride    . lactated ringers  PRN Meds:.sodium chloride, sodium chloride, hydrOXYzine, levalbuterol, metoprolol tartrate, ondansetron (ZOFRAN) IV, oxyCODONE, sodium chloride flush, sodium chloride flush, sodium chloride flush, traMADol  Xrays Dg Chest 2 View  Result Date: 04/06/2018 CLINICAL DATA:  Atelectasis EXAM: CHEST - 2 VIEW COMPARISON:  April 05, 2018 FINDINGS: Possible tiny effusions, unchanged. Cardiomediastinal silhouette is stable. Mild atelectasis in the left base. No other change. IMPRESSION: Tiny pleural effusions versus pleural thickening. No acute abnormalities are noted. Electronically Signed   By: Dorise Bullion III M.D   On: 04/06/2018 08:02   Dg Chest Port 1 View  Result Date: 04/05/2018 CLINICAL DATA:  Pleural effusion. EXAM: PORTABLE CHEST 1 VIEW COMPARISON:  Radiograph of April 04, 2018. FINDINGS: Stable cardiomegaly. Status post coronary bypass graft. No pneumothorax is noted. Right  lung is clear. Minimal left pleural effusion is noted. No consolidative process is noted. Bony thorax is unremarkable. IMPRESSION: Minimal left pleural effusion.  Stable cardiomegaly. Electronically Signed   By: Marijo Conception, M.D.   On: 04/05/2018 09:52    Assessment/Plan: S/P Procedure(s) (LRB): CORONARY ARTERY BYPASS GRAFTING (CABG) TIMES FOUR: (LIMA to LAD, CRYO VEIN to OM1, SVG to OM2, and SVG to PDA) with PARTIAL EVH/OPEN from RIGHT and LEFT GREATER SAPHENOUS VEIN and LEFT INTERNAL MAMMARY ARTERY (N/A) TRANSESOPHAGEAL ECHOCARDIOGRAM (TEE) (N/A) POD#5  1 steady progress 2 hemodyn stable in sinus rhythm- on amio/lopressor 3 sats good on RA 4 JP- 48 cc yesterday, minimal today- d/c 5 conts with good diuresis, wt below preop,BUN/ creat increasing- no diuretics for now 6 platelet count normalized , anemia is stable 7 sugars adeq controlled- restart home meds when renal fxn normalizes- cont insulin for now 8 leukocytosis improved, no fevers 9 push rehab/pulm toilet per routine  LOS: 9 days    [name] 04/06/2018 Pager [phone]   I have seen and examined the patient and agree with the assessment and plan as outlined.  Paul Alberts, MD 04/06/2018 1:20 PM

## 2018-04-06 NOTE — Progress Notes (Signed)
R leg JP drain pulled with tip intact. Gauze dressing applied and patient tolerated well. Will cont to monitor.

## 2018-04-06 NOTE — Progress Notes (Signed)
   Platelets continue to improve.  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  Serotonin assay pending.  Avoiding heparin, Lovenox.  Steadily improving.  Candee Furbish, MD

## 2018-04-06 NOTE — Progress Notes (Signed)
Patient ambulated 190ft in the hallway using the front wheel walker. Stopped and returned to room when his legs got tired. Tolerated well. This is his first walk of the day. Will continue to monitor. Lajoyce Corners, RN

## 2018-04-06 NOTE — Progress Notes (Signed)
Patient ambulated in the room making circuits from the door to the window for 30 minutes. Tolerated well. Says he has to stop when his legs begin hurting. Patient educated on the importance of listening to his body to avoid falls. Resume walking when pain subsides. Patient verbalized understanding. Will continue to monitor. Lajoyce Corners, RN

## 2018-04-07 LAB — GLUCOSE, CAPILLARY
Glucose-Capillary: 142 mg/dL — ABNORMAL HIGH (ref 70–99)
Glucose-Capillary: 100 mg/dL — ABNORMAL HIGH (ref 70–99)
Glucose-Capillary: 141 mg/dL — ABNORMAL HIGH (ref 70–99)
Glucose-Capillary: 96 mg/dL (ref 70–99)
Glucose-Capillary: 133 mg/dL — ABNORMAL HIGH (ref 70–99)
Glucose-Capillary: 152 mg/dL — ABNORMAL HIGH (ref 70–99)
Glucose-Capillary: 156 mg/dL — ABNORMAL HIGH (ref 70–99)

## 2018-04-07 MED ORDER — METOPROLOL TARTRATE 12.5 MG HALF TABLET
12.5000 mg | ORAL_TABLET | Freq: Two times a day (BID) | ORAL | Status: DC
  Administered 2018-04-07 – 2018-04-08 (×3): 12.5 mg via ORAL
  Filled 2018-04-07 (×3): qty 1

## 2018-04-07 NOTE — Progress Notes (Signed)
CARDIAC REHAB PHASE I   Pt on bed rest for EPW DC. Educated on importance of IS and flutter use. Pt was emotional. Provided support and encouragement. Pt encouraged to walk when bed rest is up.   2:20- 2:36  Jeralyn Bennett MS, ACSM CEP  2:34 PM 04/07/2018

## 2018-04-07 NOTE — Progress Notes (Signed)
CARDIOLOGY RECOMMENDATIONS:  Discharge is anticipated in the next 48 hours. Recommendations for medications and follow up:  Discharge Medications: Continue medications as they are currently listed in the [facility]. Exceptions to the above:  None   Follow Up: The patient's Primary Cardiologist is Peter Martinique, MD  Follow up in the office has been arranged with Almyra Deforest, PA-C @ our [place] office 04/21/18 @2 :30 PM (Appt date and time is in AVS).  Janee Morn, PA-C  11:49 AM 04/07/2018  [facility]

## 2018-04-07 NOTE — Progress Notes (Addendum)
      [address]       [place], [address]             [phone]        6 Days Post-Op Procedure(s) (LRB): CORONARY ARTERY BYPASS GRAFTING (CABG) TIMES FOUR: (LIMA to LAD, CRYO VEIN to OM1, SVG to OM2, and SVG to PDA) with PARTIAL EVH/OPEN from RIGHT and LEFT GREATER SAPHENOUS VEIN and LEFT INTERNAL MAMMARY ARTERY (N/A) TRANSESOPHAGEAL ECHOCARDIOGRAM (TEE) (N/A)  Subjective: Patient eating breakfast. He wants to know when he is going home.  Objective: Vital signs in last 24 hours: Temp:  [97.4 F (36.3 C)-98.3 F (36.8 C)] 98.1 F (36.7 C) (12/16 0751) Pulse Rate:  [61-91] 77 (12/16 0751) Cardiac Rhythm: Normal sinus rhythm (12/15 2015) Resp:  [15-20] 15 (12/16 0751) BP: (98-140)/(48-68) 116/66 (12/16 0751) SpO2:  [96 %-100 %] 100 % (12/16 0751) Weight:  [73.2 kg] 73.2 kg (12/16 0131)  Pre op weight 74.3 kg Current Weight  04/07/18 73.2 kg       Intake/Output from previous day: 12/15 0701 - 12/16 0700 In: 543 [P.O.:540; I.V.:3] Out: 695 [Urine:675; Drains:20]   Physical Exam:  Cardiovascular: RRR Pulmonary: Slightly diminished at bases Abdomen: Soft, non tender, bowel sounds present. Extremities: Trace bilateral lower extremity edema.Ecchymosis left thigh Wounds: Sternal wound is clean and dry.  No erythema or signs of infection. Bilateral LE wounds are clean and dry. Right thigh with sero sanguinous ooze where drain used to be.  Lab Results: CBC: Recent Labs    04/05/18 0325 04/06/18 0253  WBC 14.9* 11.8*  HGB 9.9* 10.2*  HCT 29.9* 30.9*  PLT 136* 191   BMET:  Recent Labs    04/05/18 0325 04/06/18 0253  NA 131* 132*  K 3.6 4.2  CL 93* 91*  CO2 26 26  GLUCOSE 73 121*  BUN 21 30*  CREATININE 1.45* 1.60*  CALCIUM 8.6* 8.6*    PT/INR:  Lab Results  Component Value Date   INR 1.76 04/01/2018   INR 1.13 03/28/2018   INR 1.0 08/08/2016   ABG:  INR: Will add last result for INR, ABG once components are confirmed Will  add last 4 CBG results once components are confirmed  Assessment/Plan:  1. CV - SR, first degree heart block. On Amiodarone 200 mg bid and Lopressor 12.5 mg bid. Will start Plavix 75 mg daily in am (as has cryo vein) and decrease ecasa to 81 mg in am. 2.  Pulmonary - On room air. Encourage incentive spirometer. 3. DM-CBGs 100/141/96. On Insulin. Pre op HGA1C 6.9. He was on Glipizide and Metformin pre op, but will not restart until creatinine improved. 4.  Acute blood loss anemia - H and H yesterday up to 10.2 and 30.9 5. Creatinine 1.6 yesterday. Admission creatinine 1.51. He is not on diuretic or ACE/ARB.  Recheck in am.  6. 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 Await SRA results. 7. Remove EPW 8. Possibly home 1-2 days  Sharalyn Ink ZimmermanPA-C 04/07/2018,8:08 AM [phone]  Patient doing well and expected discharge tomorrow Sinus rhythm Chest x-ray clear Incisions clean and dry Agree with above assessment and plan Dahlia Byes MD

## 2018-04-07 NOTE — Progress Notes (Signed)
Physical Therapy Treatment Patient Details Name: [name] MRN: [id number] DOB: Sep 20, 1945 Today's Date: 04/07/2018    History of Present Illness 72 y.o. male w/ PMH of CAD, PAD, HTN, HLD, Type 2 DM, depression and intermittent tobacco use who presented to [facility] ED on 03/26/2018 for evaluation of chest pain. transferred to [facility] for Cath. CABG x 4 12/10, intubated-extubated 12/10. CXR Stable bibasilar atelectasis    PT Comments    Pt making excellent progress. Recommend return home. Do not feel he needs PT after DC.    Follow Up Recommendations  Supervision - Intermittent     Equipment Recommendations  None recommended by PT    Recommendations for Other Services       Precautions / Restrictions Precautions Precautions: Fall;Sternal Restrictions Weight Bearing Restrictions: Yes(sternal precautions) Other Position/Activity Restrictions: sternal precautions    Mobility  Bed Mobility Overal bed mobility: Modified Independent Bed Mobility: Rolling;Sidelying to Sit Rolling: Modified independent (Device/Increase time) Sidelying to sit: Modified independent (Device/Increase time)          Transfers Overall transfer level: Modified independent Equipment used: Rolling walker (2 wheeled) Transfers: Sit to/from Stand Sit to Stand: Modified independent (Device/Increase time)         General transfer comment: Pt with hand placement following sternal precautions  Ambulation/Gait Ambulation/Gait assistance: Supervision Gait Distance (Feet): 225 Feet Assistive device: 4-wheeled walker Gait Pattern/deviations: Decreased stride length;Step-through pattern Gait velocity: decr Gait velocity interpretation: 1.31 - 2.62 ft/sec, indicative of limited community ambulator General Gait Details: amb with rollator with steady gait   Stairs             Wheelchair Mobility    Modified Rankin (Stroke Patients Only)       Balance Overall balance  assessment: Needs assistance Sitting-balance support: No upper extremity supported;Feet supported Sitting balance-Leahy Scale: Good     Standing balance support: During functional activity;No upper extremity supported Standing balance-Leahy Scale: Fair                              Cognition Arousal/Alertness: Awake/alert Behavior During Therapy: WFL for tasks assessed/performed Overall Cognitive Status: Within Functional Limits for tasks assessed                                        Exercises      General Comments        Pertinent Vitals/Pain      Home Living                      Prior Function            PT Goals (current goals can now be found in the care plan section) Progress towards PT goals: Progressing toward goals    Frequency    Min 3X/week      PT Plan Discharge plan needs to be updated    Co-evaluation              AM-PAC PT "6 Clicks" Mobility   Outcome Measure  Help needed turning from your back to your side while in a flat bed without using bedrails?: None Help needed moving from lying on your back to sitting on the side of a flat bed without using bedrails?: None Help needed moving to and from a bed to a chair (including a wheelchair)?: None  Help needed standing up from a chair using your arms (e.g., wheelchair or bedside chair)?: None Help needed to walk in hospital room?: A Little Help needed climbing 3-5 steps with a railing? : A Little 6 Click Score: 22    End of Session   Activity Tolerance: Patient tolerated treatment well Patient left: with call bell/phone within reach;in bed(sitting EOB) Nurse Communication: Mobility status PT Visit Diagnosis: Muscle weakness (generalized) (M62.81);Unsteadiness on feet (R26.81)     Time: 3735-7897 PT Time Calculation (min) (ACUTE ONLY): 15 min  Charges:  $Gait Training: 8-22 mins                     [place] Pager [phone] Office [place] 04/07/2018, 10:12 AM

## 2018-04-07 NOTE — Progress Notes (Addendum)
Epicardial pacing wires discontinued per order with tips intact and patient tolerated well. He is on bedrest and verbalizes understanding. Leg incisions with staples painted with betadine and guaze dressings applied. R proximal incision above drain site appears to be only area with drainage at this time.

## 2018-04-08 LAB — BASIC METABOLIC PANEL
Anion gap: 10 (ref 5–15)
BUN: 33 mg/dL — ABNORMAL HIGH (ref 8–23)
CO2: 26 mmol/L (ref 22–32)
Calcium: 8.9 mg/dL (ref 8.9–10.3)
Chloride: 97 mmol/L — ABNORMAL LOW (ref 98–111)
Creatinine, Ser: 1.82 mg/dL — ABNORMAL HIGH (ref 0.61–1.24)
GFR calc Af Amer: 42 mL/min — ABNORMAL LOW (ref >60)
GFR calc non Af Amer: 36 mL/min — ABNORMAL LOW (ref >60)
Glucose, Bld: 163 mg/dL — ABNORMAL HIGH (ref 70–99)
Potassium: 5.2 mmol/L — ABNORMAL HIGH (ref 3.5–5.1)
Sodium: 133 mmol/L — ABNORMAL LOW (ref 135–145)

## 2018-04-08 LAB — SEROTONIN RELEASE ASSAY (SRA)
SRA .2 IU/mL UFH Ser-aCnc: 7 % (ref 0–20)
SRA, HIGH DOSE HEPARIN: 7 % (ref 0–20)

## 2018-04-08 LAB — CBC
HCT: 29.9 % — ABNORMAL LOW (ref 39.0–52.0)
Hemoglobin: 9.5 g/dL — ABNORMAL LOW (ref 13.0–17.0)
MCH: 29.1 pg (ref 26.0–34.0)
MCHC: 31.8 g/dL (ref 30.0–36.0)
MCV: 91.4 fL (ref 80.0–100.0)
Platelets: 308 10*3/uL (ref 150–400)
RBC: 3.27 MIL/uL — ABNORMAL LOW (ref 4.22–5.81)
RDW: 14.7 % (ref 11.5–15.5)
WBC: 11.5 10*3/uL — ABNORMAL HIGH (ref 4.0–10.5)
nRBC: 0 % (ref 0.0–0.2)

## 2018-04-08 LAB — GLUCOSE, CAPILLARY
Glucose-Capillary: 147 mg/dL — ABNORMAL HIGH (ref 70–99)
Glucose-Capillary: 186 mg/dL — ABNORMAL HIGH (ref 70–99)
Glucose-Capillary: 123 mg/dL — ABNORMAL HIGH (ref 70–99)
Glucose-Capillary: 154 mg/dL — ABNORMAL HIGH (ref 70–99)

## 2018-04-08 LAB — CREATININE, SERUM
Creatinine, Ser: 1.68 mg/dL — ABNORMAL HIGH (ref 0.61–1.24)
GFR calc non Af Amer: 40 mL/min — ABNORMAL LOW (ref >60)
GFR, EST AFRICAN AMERICAN: 46 mL/min — AB (ref >60)

## 2018-04-08 LAB — POTASSIUM: Potassium: 4.6 mmol/L (ref 3.5–5.1)

## 2018-04-08 MED ORDER — CLOPIDOGREL BISULFATE 75 MG PO TABS
75.0000 mg | ORAL_TABLET | Freq: Every day | ORAL | Status: DC
  Administered 2018-04-09: 75 mg via ORAL
  Filled 2018-04-08: qty 1

## 2018-04-08 MED ORDER — ASPIRIN EC 81 MG PO TBEC
81.0000 mg | DELAYED_RELEASE_TABLET | Freq: Every day | ORAL | Status: DC
  Administered 2018-04-08 – 2018-04-09 (×2): 81 mg via ORAL
  Filled 2018-04-08 (×2): qty 1

## 2018-04-08 MED ORDER — FE FUMARATE-B12-VIT C-FA-IFC PO CAPS
1.0000 | ORAL_CAPSULE | Freq: Every day | ORAL | Status: DC
  Administered 2018-04-08 – 2018-04-09 (×2): 1 via ORAL
  Filled 2018-04-08 (×2): qty 1

## 2018-04-08 NOTE — Progress Notes (Signed)
Patient ambulated about 200 ft in the hall using a walker on room air with a stand by assist, ambulation well tolerated will continue to monitor.

## 2018-04-08 NOTE — Care Management Important Message (Signed)
Important Message  Patient Details  Name: [name] MRN: [id number] Date of Birth: [birth year]   Medicare Important Message Given:  Yes    Barb Merino Willam Munford 04/08/2018, 12:39 PM

## 2018-04-08 NOTE — Progress Notes (Signed)
[place] before lunch to walk with pt but he stated he was too sleepy. Came now and RN stated he walked to desk about 30 minutes ago. Will continue to follow. Graylon Good RN BSN 04/08/2018 1:46 PM  '

## 2018-04-08 NOTE — Progress Notes (Addendum)
[name]       [place] [address]             [phone]        7 Days Post-Op Procedure(s) (LRB): CORONARY ARTERY BYPASS GRAFTING (CABG) TIMES FOUR: (LIMA to LAD, CRYO VEIN to OM1, SVG to OM2, and SVG to PDA) with PARTIAL EVH/OPEN from RIGHT and LEFT GREATER SAPHENOUS VEIN and LEFT INTERNAL MAMMARY ARTERY (N/A) TRANSESOPHAGEAL ECHOCARDIOGRAM (TEE) (N/A)  Subjective: Patient has to urinate this am. He states his breathing is "so so"  Objective: Vital signs in last 24 hours: Temp:  [97.6 F (36.4 C)-98.1 F (36.7 C)] 98 F (36.7 C) (12/17 0339) Pulse Rate:  [70-78] 70 (12/17 0339) Cardiac Rhythm: Normal sinus rhythm (12/17 0500) Resp:  [15-27] 18 (12/17 0339) BP: (95-131)/(39-66) 95/39 (12/17 0339) SpO2:  [93 %-100 %] 93 % (12/17 0339) Weight:  [72.4 kg] 72.4 kg (12/17 0339)  Pre op weight 74.3 kg Current Weight  04/08/18 72.4 kg       Intake/Output from previous day: 12/16 0701 - 12/17 0700 In: 120 [P.O.:120] Out: 200 [Urine:200]   Physical Exam:  Cardiovascular: RRR Pulmonary: Slightly diminished at bases Abdomen: Soft, non tender, bowel sounds present. Extremities: Trace bilateral lower extremity edema.Ecchymosis left and right thigh Wounds: Sternal wound is clean and dry.  No erythema or signs of infection. Bilateral LE wounds are clean and dry.   Lab Results: CBC: Recent Labs    04/06/18 0253 04/08/18 0411  WBC 11.8* 11.5*  HGB 10.2* 9.5*  HCT 30.9* 29.9*  PLT 191 308   BMET:  Recent Labs    04/06/18 0253 04/08/18 0411  NA 132* 133*  K 4.2 5.2*  CL 91* 97*  CO2 26 26  GLUCOSE 121* 163*  BUN 30* 33*  CREATININE 1.60* 1.82*  CALCIUM 8.6* 8.9    PT/INR:  Lab Results  Component Value Date   INR 1.76 04/01/2018   INR 1.13 03/28/2018   INR 1.0 08/08/2016   ABG:  INR: Will add last result for INR, ABG once components are confirmed Will add last 4 CBG results once components are  confirmed  Assessment/Plan:  1. CV - SR, first degree heart block. Hypotensive this am (SBP in the 90's). On Amiodarone 200 mg bid and Lopressor 12.5 mg bid. Will stop Lopressor for now. Will start Plavix 75 mg daily  (as has cryo vein) and decrease ecasa to 81 mg at Dr. Lucianne Lei Trigt's discretion, likely at discharge. 2.  Pulmonary - On room air. Encourage incentive spirometer. 3. DM-CBGs 152/156/147. On Insulin. Pre op HGA1C 6.9. He was on Glipizide and Metformin pre op, but will not restart until creatinine improved. 4.  Acute blood loss anemia - H and H yesterday up to 9.5 and 30.9. Will start Trinsicon 5. Creatinine up to 1.82 this am. Admission creatinine 1.51. He is not on diuretic or ACE/ARB.  Recheck this afternoon and in am. He may need to be put on a Dopamine drip if creatinine worsens. 6. 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 Await SRA results. 7. Hyperkalemia-will re check this am  Donielle M ZimmermanPA-C 04/08/2018,7:40 AM [phone]  PM creatinine shows some improvement at 1.6 Oxygen has been weaned off.  Blood pressure improved this p.m. Continue current care Patient will probably be discharged home with home health nurse for restorative care  patient examined and medical record reviewed,agree with above note. Tharon Aquas Trigt III 04/08/2018

## 2018-04-08 NOTE — Progress Notes (Signed)
Patient walked in the hall three times on this shift using a walker, ambulation well tolerated

## 2018-04-09 LAB — BASIC METABOLIC PANEL
ANION GAP: 11 (ref 5–15)
BUN: 34 mg/dL — ABNORMAL HIGH (ref 8–23)
CO2: 25 mmol/L (ref 22–32)
Calcium: 9 mg/dL (ref 8.9–10.3)
Chloride: 98 mmol/L (ref 98–111)
Creatinine, Ser: 1.58 mg/dL — ABNORMAL HIGH (ref 0.61–1.24)
GFR calc Af Amer: 50 mL/min — ABNORMAL LOW (ref >60)
GFR, EST NON AFRICAN AMERICAN: 43 mL/min — AB (ref >60)
Glucose, Bld: 128 mg/dL — ABNORMAL HIGH (ref 70–99)
Potassium: 4.6 mmol/L (ref 3.5–5.1)
Sodium: 134 mmol/L — ABNORMAL LOW (ref 135–145)

## 2018-04-09 LAB — GLUCOSE, CAPILLARY: Glucose-Capillary: 127 mg/dL — ABNORMAL HIGH (ref 70–99)

## 2018-04-09 MED ORDER — CARVEDILOL 3.125 MG PO TABS: 3.1250 mg | ORAL_TABLET | Freq: Every day | ORAL | 1 refills | Status: DC

## 2018-04-09 MED ORDER — CLOPIDOGREL BISULFATE 75 MG PO TABS: 75.0000 mg | ORAL_TABLET | Freq: Every day | ORAL | 1 refills | Status: DC

## 2018-04-09 MED ORDER — TRAMADOL HCL 50 MG PO TABS: ORAL_TABLET | ORAL | 0 refills | Status: DC

## 2018-04-09 MED ORDER — FERROUS SULFATE 325 (65 FE) MG PO TABS: 325.0000 mg | ORAL_TABLET | Freq: Every day | ORAL | 3 refills | Status: DC

## 2018-04-09 MED ORDER — ASPIRIN 81 MG PO TBEC: 81.0000 mg | DELAYED_RELEASE_TABLET | Freq: Every day | ORAL | Status: DC

## 2018-04-09 MED ORDER — GUAIFENESIN ER 600 MG PO TB12: 600.0000 mg | ORAL_TABLET | Freq: Two times a day (BID) | ORAL | Status: DC | PRN

## 2018-04-09 NOTE — Progress Notes (Signed)
Physical Therapy Treatment & Discharge Patient Details Name: [name] MRN: [id number] DOB: [birth year] Today's Date: 04/09/2018    History of Present Illness Pt is a 72 y.o. male admitted to [facility] on 03/26/2018 for evaluation of chest pain; transferred to [facility] for cath. S/p CABG x4 on 12/10; intubated-extubated 12/10. CXR shows stable bibasilar atelectasis. PMH of CAD, PAD, HTN, HLD, DM2, depression, intermittent tobacco use.   PT Comments    Pt progressing well with mobility. Mod indep with transfers and ambulation using RW; supervision to ascend/descend steps with rail support. Demonstrates good awareness of sternal precautions. Has met short-term acute PT goals; pt has no further questions or concerns. Will have assist from wife upon return home. Discharge acute PT.   Follow Up Recommendations  No PT follow up;Supervision - Intermittent     Equipment Recommendations  None recommended by PT    Recommendations for Other Services       Precautions / Restrictions Precautions Precautions: Fall;Sternal Restrictions Weight Bearing Restrictions: (sternal precautions) Other Position/Activity Restrictions: sternal precautions    Mobility  Bed Mobility Overal bed mobility: Modified Independent Bed Mobility: Rolling;Sidelying to Sit              Transfers Overall transfer level: Modified independent Equipment used: Rolling walker (2 wheeled) Transfers: Sit to/from Stand              Ambulation/Gait Ambulation/Gait assistance: Modified independent (Device/Increase time) Gait Distance (Feet): 200 Feet Assistive device: Rolling walker (2 wheeled) Gait Pattern/deviations: Decreased stride length;Step-through pattern Gait velocity: Decreased Gait velocity interpretation: 1.31 - 2.62 ft/sec, indicative of limited community ambulator     Stairs Stairs: Yes Stairs assistance: Supervision Stair Management: One rail Right;Step to pattern;Forwards Number of  Stairs: 3 General stair comments: Ascending/descended 3 steps with single UE support on rail; supervision for safety   Wheelchair Mobility    Modified Rankin (Stroke Patients Only)       Balance Overall balance assessment: Needs assistance Sitting-balance support: No upper extremity supported;Feet supported Sitting balance-Leahy Scale: Good       Standing balance-Leahy Scale: Fair Standing balance comment: Able to stand and amb in room without UE support                            Cognition Arousal/Alertness: Awake/alert Behavior During Therapy: WFL for tasks assessed/performed Overall Cognitive Status: Within Functional Limits for tasks assessed                                        Exercises      General Comments        Pertinent Vitals/Pain Pain Assessment: Faces Faces Pain Scale: Hurts a little bit Pain Location: sternal incision Pain Descriptors / Indicators: Discomfort Pain Intervention(s): Monitored during session;Limited activity within patient's tolerance    Home Living                      Prior Function            PT Goals (current goals can now be found in the care plan section) Progress towards PT goals: Goals met/education completed, patient discharged from PT    Frequency    Min 3X/week      PT Plan Current plan remains appropriate    Co-evaluation  AM-PAC PT "6 Clicks" Mobility   Outcome Measure  Help needed turning from your back to your side while in a flat bed without using bedrails?: None Help needed moving from lying on your back to sitting on the side of a flat bed without using bedrails?: None Help needed moving to and from a bed to a chair (including a wheelchair)?: None Help needed standing up from a chair using your arms (e.g., wheelchair or bedside chair)?: None Help needed to walk in hospital room?: None Help needed climbing 3-5 steps with a railing? : A Little 6  Click Score: 23    End of Session Equipment Utilized During Treatment: Gait belt Activity Tolerance: Patient tolerated treatment well Patient left: with call bell/phone within reach(seated EOB) Nurse Communication: Mobility status PT Visit Diagnosis: Muscle weakness (generalized) (M62.81);Unsteadiness on feet (R26.81)     Time: 7639-4320 PT Time Calculation (min) (ACUTE ONLY): 14 min  Charges:  $Gait Training: 8-22 mins                    Mabeline Caras, PT, DPT Acute Rehabilitation Services  Pager [phone] Office [place] 04/09/2018, 9:07 AM

## 2018-04-09 NOTE — Progress Notes (Signed)
Patient in stable condition , discharge education reviewed with patient he verbalized understanding, patient stated that he would have his family look at the discharge papers when he gets home, iv removed, tele dc ccmd notified, patient belongings at bedisde, prescriptions given to patient, walker for home use given to patient, patient to be transported home by his wife.

## 2018-04-09 NOTE — Progress Notes (Signed)
1125-1148 Education completed with pt and wife who voiced understanding. Reviewed sternal precautions and staying in the tube. Encouraged IS and flutter valve. Gave ex ed, heart healthy and diabetic diets. Referred to [place] CRP 2. Pt has walker in room for home use. Pt stated he has not smoked since about October. Plans to continue to not smoke. Graylon Good RN BSN 04/09/2018 11:49 AM

## 2018-04-09 NOTE — Progress Notes (Addendum)
      [address]       [place] [address]             [phone]        8 Days Post-Op Procedure(s) (LRB): CORONARY ARTERY BYPASS GRAFTING (CABG) TIMES FOUR: (LIMA to LAD, CRYO VEIN to OM1, SVG to OM2, and SVG to PDA) with PARTIAL EVH/OPEN from RIGHT and LEFT GREATER SAPHENOUS VEIN and LEFT INTERNAL MAMMARY ARTERY (N/A) TRANSESOPHAGEAL ECHOCARDIOGRAM (TEE) (N/A)  Subjective: Patient sitting in chair and has no specific complaints. He wants to go home.   Objective: Vital signs in last 24 hours: Temp:  [97.6 F (36.4 C)-98.3 F (36.8 C)] 97.9 F (36.6 C) (12/18 0742) Pulse Rate:  [71-80] 76 (12/18 0742) Cardiac Rhythm: Normal sinus rhythm;Heart block (12/18 0701) Resp:  [16-21] 20 (12/18 0742) BP: (94-130)/(42-67) 94/50 (12/18 0742) SpO2:  [97 %-100 %] 97 % (12/18 0742) Weight:  [72.7 kg] 72.7 kg (12/18 0353)  Pre op weight 74.3 kg Current Weight  04/09/18 72.7 kg      Intake/Output from previous day: 12/17 0701 - 12/18 0700 In: 840 [P.O.:840] Out: 220 [Urine:220]   Physical Exam:  Cardiovascular: RRR Pulmonary: Clear to auscultation bilaterally Abdomen: Soft, non tender, +++ bowel sounds present. Extremities: No LE edema.Ecchymosis left and right thigh Wounds: Sternal wound is clean and dry.  No erythema or signs of infection. Bilateral LE wounds are clean and dry.   Lab Results: CBC: Recent Labs    04/08/18 0411  WBC 11.5*  HGB 9.5*  HCT 29.9*  PLT 308   BMET:  Recent Labs    04/08/18 0411 04/08/18 1135 04/08/18 1541 04/09/18 0227  NA 133*  --   --  134*  K 5.2* 4.6  --  4.6  CL 97*  --   --  98  CO2 26  --   --  25  GLUCOSE 163*  --   --  128*  BUN 33*  --   --  34*  CREATININE 1.82*  --  1.68* 1.58*  CALCIUM 8.9  --   --  9.0    PT/INR:  Lab Results  Component Value Date   INR 1.76 04/01/2018   INR 1.13 03/28/2018   INR 1.0 08/08/2016   ABG:  INR: Will add last result for INR, ABG once components are  confirmed Will add last 4 CBG results once components are confirmed  Assessment/Plan:  1. CV - SR, first degree heart block. Hypotensive again this am (SBP in the 90's). On Amiodarone 200 mg bid. Will start Plavix 75 mg daily  (as has cryo vein) and decrease ecasa to 81 mg per Dr. [name]. Also, per Dr. [name], will start low dose daily Coreg at discharge. 2.  Pulmonary - On room air. Encourage incentive spirometer. 3. DM-CBGs 152/156/147. On Insulin. Pre op HGA1C 6.9. He was on Glipizide and Metformin pre op, but will not restart until creatinine improved. 4.  Acute blood loss anemia - H and H yesterday up to 9.5 and 30.9. Will start Trinsicon 5. Creatinine decreased to 1.58. Admission creatinine 1.51. He is not on diuretic or ACE/ARB.  6. 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 SRA within normal limits. 7. Hyperkalemia-potassium down to and remains 4.6 8. Will discharge later today  Cailin Gebel M ZimmermanPA-C 04/09/2018,8:58 AM [phone]

## 2018-04-09 NOTE — Care Management Note (Addendum)
Case Management Note Initial CM note completed by Midge Minium RN, BSN, NCM-BC, ACM-RN [phone] 04/03/2018, 2:04 PM  Patient Details  Name: [name] MRN: [id number] Date of Birth: 06/29/1945  Subjective/Objective: 72 yo male presented for evaluation of CP; s/p CABG.                 Action/Plan: CM met with patient/son to discuss dispositional needs. Patient lived at home with spouse PTA, independent with ADLs. PCP verified as: Dr. Baltazar Apo, also followed by Dr. Juline Patch [facility]); pharmacy: [facility]. Family requested CM to contact [facility] to inform of patients hospitalization. CM spoke to [facility] [employer]) and to [facility] Scientist, research (life sciences)) and made both aware of patients hospitalization/POC as requested. Please contact [facility] for patients transitional needs: [phone] (ext [phone]) or Fritz Pickerel (ext [phone]); Fax: [phone]. Plans for patient to transition home with his daughter/son-in-law for 24/7 assistance after discharge. CM team will continue following for needs.   Expected Discharge Date:                 Expected Discharge Plan:  Home/Self Care  In-House Referral:  NA  Discharge planning Services  CM Consult  Post Acute Care Choice:  Durable Medical Equipment Choice offered to:  Patient, Spouse  DME Arranged:  Walker rolling DME Agency:  [place] Arranged:  RN [facility] Agency:  [place]  Status of Service:  Completed, signed off  If discussed at [place] of Stay Meetings, dates discussed:    Discharge Disposition: home/home health   Additional Comments:   04/09/18- 1130- Marvetta Gibbons RN, CM- pt for transition home today, orders have been placed for DME- RW and HHRN- CM spoke with pt and wife at bedside- list provided per CMS site with star ratings for [facility] agency in pt's zip code. Per pt's wife they have chosen [facility] for [facility] needs. Confirmed pt's address  and phone # in epic. Pt also confirmed his PCP which he has one in [place] and one at the [facility] as stated above. Pt will plan to use [facility] for any med needs on discharge. Wife present to provide transportation home. Referral called to Butch Penny with [facility] for [facility] need- referral accepted, Notified James with [facility] for RW need- which has been delivered to room.   Marvetta Gibbons RN, BSN Transitions of Care Unit 4E- RN Case Manager [phone] 04/09/2018, 11:28 AM

## 2018-04-10 DIAGNOSIS — Z4802 Encounter for removal of sutures: Secondary | ICD-10-CM | POA: Diagnosis not present

## 2018-04-10 DIAGNOSIS — I70203 Unspecified atherosclerosis of native arteries of extremities, bilateral legs: Secondary | ICD-10-CM | POA: Diagnosis not present

## 2018-04-10 DIAGNOSIS — E1122 Type 2 diabetes mellitus with diabetic chronic kidney disease: Secondary | ICD-10-CM | POA: Diagnosis not present

## 2018-04-10 DIAGNOSIS — Z951 Presence of aortocoronary bypass graft: Secondary | ICD-10-CM | POA: Diagnosis not present

## 2018-04-10 DIAGNOSIS — N183 Chronic kidney disease, stage 3 (moderate): Secondary | ICD-10-CM | POA: Diagnosis not present

## 2018-04-10 DIAGNOSIS — E1151 Type 2 diabetes mellitus with diabetic peripheral angiopathy without gangrene: Secondary | ICD-10-CM | POA: Diagnosis not present

## 2018-04-10 DIAGNOSIS — I25110 Atherosclerotic heart disease of native coronary artery with unstable angina pectoris: Secondary | ICD-10-CM | POA: Diagnosis not present

## 2018-04-10 DIAGNOSIS — I1310 Hypertensive heart and chronic kidney disease without heart failure, with stage 1 through stage 4 chronic kidney disease, or unspecified chronic kidney disease: Secondary | ICD-10-CM | POA: Diagnosis not present

## 2018-04-10 DIAGNOSIS — Z48812 Encounter for surgical aftercare following surgery on the circulatory system: Secondary | ICD-10-CM | POA: Diagnosis not present

## 2018-04-11 ENCOUNTER — Telehealth: Payer: Self-pay

## 2018-04-11 NOTE — Telephone Encounter (Signed)
Nurse, Arbie Cookey with [facility] [phone] called per patient request to have patient's sutures/ staples removed at his home instead of coming into the office for an appointment.  Patient is s/p CABG x4 04/01/2018 by Dr. [name].  Verbal orders given over the phone to have removed by nursing staff with home health.  Arbie Cookey verbalized receipt.

## 2018-04-13 DIAGNOSIS — E1151 Type 2 diabetes mellitus with diabetic peripheral angiopathy without gangrene: Secondary | ICD-10-CM | POA: Diagnosis not present

## 2018-04-13 DIAGNOSIS — Z4802 Encounter for removal of sutures: Secondary | ICD-10-CM | POA: Diagnosis not present

## 2018-04-13 DIAGNOSIS — I25110 Atherosclerotic heart disease of native coronary artery with unstable angina pectoris: Secondary | ICD-10-CM | POA: Diagnosis not present

## 2018-04-13 DIAGNOSIS — Z951 Presence of aortocoronary bypass graft: Secondary | ICD-10-CM | POA: Diagnosis not present

## 2018-04-13 DIAGNOSIS — I70203 Unspecified atherosclerosis of native arteries of extremities, bilateral legs: Secondary | ICD-10-CM | POA: Diagnosis not present

## 2018-04-13 DIAGNOSIS — Z48812 Encounter for surgical aftercare following surgery on the circulatory system: Secondary | ICD-10-CM | POA: Diagnosis not present

## 2018-04-13 DIAGNOSIS — N183 Chronic kidney disease, stage 3 (moderate): Secondary | ICD-10-CM | POA: Diagnosis not present

## 2018-04-13 DIAGNOSIS — I1310 Hypertensive heart and chronic kidney disease without heart failure, with stage 1 through stage 4 chronic kidney disease, or unspecified chronic kidney disease: Secondary | ICD-10-CM | POA: Diagnosis not present

## 2018-04-13 DIAGNOSIS — E1122 Type 2 diabetes mellitus with diabetic chronic kidney disease: Secondary | ICD-10-CM | POA: Diagnosis not present

## 2018-04-14 DIAGNOSIS — Z48812 Encounter for surgical aftercare following surgery on the circulatory system: Secondary | ICD-10-CM | POA: Diagnosis not present

## 2018-04-14 DIAGNOSIS — E1151 Type 2 diabetes mellitus with diabetic peripheral angiopathy without gangrene: Secondary | ICD-10-CM | POA: Diagnosis not present

## 2018-04-14 DIAGNOSIS — Z4802 Encounter for removal of sutures: Secondary | ICD-10-CM | POA: Diagnosis not present

## 2018-04-14 DIAGNOSIS — E1122 Type 2 diabetes mellitus with diabetic chronic kidney disease: Secondary | ICD-10-CM | POA: Diagnosis not present

## 2018-04-14 DIAGNOSIS — Z951 Presence of aortocoronary bypass graft: Secondary | ICD-10-CM | POA: Diagnosis not present

## 2018-04-14 DIAGNOSIS — I70203 Unspecified atherosclerosis of native arteries of extremities, bilateral legs: Secondary | ICD-10-CM | POA: Diagnosis not present

## 2018-04-14 DIAGNOSIS — I1310 Hypertensive heart and chronic kidney disease without heart failure, with stage 1 through stage 4 chronic kidney disease, or unspecified chronic kidney disease: Secondary | ICD-10-CM | POA: Diagnosis not present

## 2018-04-14 DIAGNOSIS — N183 Chronic kidney disease, stage 3 (moderate): Secondary | ICD-10-CM | POA: Diagnosis not present

## 2018-04-14 DIAGNOSIS — I25110 Atherosclerotic heart disease of native coronary artery with unstable angina pectoris: Secondary | ICD-10-CM | POA: Diagnosis not present

## 2018-04-18 DIAGNOSIS — E1122 Type 2 diabetes mellitus with diabetic chronic kidney disease: Secondary | ICD-10-CM | POA: Diagnosis not present

## 2018-04-18 DIAGNOSIS — I1310 Hypertensive heart and chronic kidney disease without heart failure, with stage 1 through stage 4 chronic kidney disease, or unspecified chronic kidney disease: Secondary | ICD-10-CM | POA: Diagnosis not present

## 2018-04-18 DIAGNOSIS — Z4802 Encounter for removal of sutures: Secondary | ICD-10-CM | POA: Diagnosis not present

## 2018-04-18 DIAGNOSIS — E1151 Type 2 diabetes mellitus with diabetic peripheral angiopathy without gangrene: Secondary | ICD-10-CM | POA: Diagnosis not present

## 2018-04-18 DIAGNOSIS — I70203 Unspecified atherosclerosis of native arteries of extremities, bilateral legs: Secondary | ICD-10-CM | POA: Diagnosis not present

## 2018-04-18 DIAGNOSIS — I25110 Atherosclerotic heart disease of native coronary artery with unstable angina pectoris: Secondary | ICD-10-CM | POA: Diagnosis not present

## 2018-04-18 DIAGNOSIS — Z951 Presence of aortocoronary bypass graft: Secondary | ICD-10-CM | POA: Diagnosis not present

## 2018-04-18 DIAGNOSIS — N183 Chronic kidney disease, stage 3 (moderate): Secondary | ICD-10-CM | POA: Diagnosis not present

## 2018-04-18 DIAGNOSIS — Z48812 Encounter for surgical aftercare following surgery on the circulatory system: Secondary | ICD-10-CM | POA: Diagnosis not present

## 2018-04-21 ENCOUNTER — Ambulatory Visit (INDEPENDENT_AMBULATORY_CARE_PROVIDER_SITE_OTHER): Payer: Medicare HMO | Admitting: Medical

## 2018-04-21 ENCOUNTER — Encounter: Payer: Self-pay | Admitting: Physician Assistant

## 2018-04-21 VITALS — BP 114/62 | HR 94 | Ht 71.0 in | Wt 164.0 lb

## 2018-04-21 DIAGNOSIS — I10 Essential (primary) hypertension: Secondary | ICD-10-CM

## 2018-04-21 DIAGNOSIS — I1310 Hypertensive heart and chronic kidney disease without heart failure, with stage 1 through stage 4 chronic kidney disease, or unspecified chronic kidney disease: Secondary | ICD-10-CM | POA: Diagnosis not present

## 2018-04-21 DIAGNOSIS — K219 Gastro-esophageal reflux disease without esophagitis: Secondary | ICD-10-CM | POA: Diagnosis not present

## 2018-04-21 DIAGNOSIS — N183 Chronic kidney disease, stage 3 (moderate): Secondary | ICD-10-CM | POA: Diagnosis not present

## 2018-04-21 DIAGNOSIS — I739 Peripheral vascular disease, unspecified: Secondary | ICD-10-CM | POA: Diagnosis not present

## 2018-04-21 DIAGNOSIS — E1151 Type 2 diabetes mellitus with diabetic peripheral angiopathy without gangrene: Secondary | ICD-10-CM | POA: Diagnosis not present

## 2018-04-21 DIAGNOSIS — Z4802 Encounter for removal of sutures: Secondary | ICD-10-CM | POA: Diagnosis not present

## 2018-04-21 DIAGNOSIS — I2510 Atherosclerotic heart disease of native coronary artery without angina pectoris: Secondary | ICD-10-CM | POA: Diagnosis not present

## 2018-04-21 DIAGNOSIS — E1122 Type 2 diabetes mellitus with diabetic chronic kidney disease: Secondary | ICD-10-CM | POA: Diagnosis not present

## 2018-04-21 DIAGNOSIS — Z48812 Encounter for surgical aftercare following surgery on the circulatory system: Secondary | ICD-10-CM | POA: Diagnosis not present

## 2018-04-21 DIAGNOSIS — Z951 Presence of aortocoronary bypass graft: Secondary | ICD-10-CM | POA: Diagnosis not present

## 2018-04-21 DIAGNOSIS — E7800 Pure hypercholesterolemia, unspecified: Secondary | ICD-10-CM

## 2018-04-21 DIAGNOSIS — I2584 Coronary atherosclerosis due to calcified coronary lesion: Secondary | ICD-10-CM | POA: Diagnosis not present

## 2018-04-21 DIAGNOSIS — I70203 Unspecified atherosclerosis of native arteries of extremities, bilateral legs: Secondary | ICD-10-CM | POA: Diagnosis not present

## 2018-04-21 DIAGNOSIS — I25110 Atherosclerotic heart disease of native coronary artery with unstable angina pectoris: Secondary | ICD-10-CM | POA: Diagnosis not present

## 2018-04-21 MED ORDER — PANTOPRAZOLE SODIUM 20 MG PO TBEC: 20.0000 mg | DELAYED_RELEASE_TABLET | Freq: Every day | ORAL | 3 refills | Status: DC

## 2018-04-21 NOTE — Progress Notes (Signed)
Cardiology Office Note   Date:  04/21/2018   ID:  [name], [name] 1945-04-29, MRN [id number]  PCP:  [name], MD  Cardiologist:  Peter Martinique, MD EP: None  Chief Complaint  Patient presents with  . Hospitalization Follow-up    s/p CABG 04/01/18      History of Present Illness: [name] is a 72 y.o. male with A PMH of CAD s/p CABG, HTN, HLD, PAD, DM type 2, Crohn's, GERD, prostate cancer s/p radioactive seeds, and prior tobacco abuse who presents for post-hospitalization cardiology follow-up.   He presented to [facility] 03/26/18 with complaints of chest pain and SOB. Troponins were mildly elevated to 0.04 with EKG non-ischemic. Given concerns for unstable angina, he was transferred to [facility] where he underwent a LHC which showed severe 3-vessel CAD. Echo 03/26/18 showed EF 60-65%, G1DD, and akinesis of the mid-apical anteroseptal myocardium. He underwent CABG 71/24/58 without complications. 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 He was discharged from the hospital on 04/09/18.   He presents today for his post-hospital follow-up appointment with his wife. Since discharge he has done fairly well. Still with some chest soreness, particularly with certain movements. He has been experiencing some SOB but no significant change from baseline. He has been experiencing some anxiety and asked if it was okay to take risperidone 0.25mg  at bedtime as needed which was previously prescribed by the [place]. He reports occasional heart racing sensations with activity which only last for a few seconds. He denies hematuria, melena, or hematochezia. He also asked if it would be possible to get a staple removed from one of his vein graft harvest sites which is the only remaining staple after surgery.   Past Medical History:  Diagnosis Date  . Adenocarcinoma of prostate ([place])   . Anxiety   . CAD  (coronary artery disease)    a. s/p prior stenting of LAD in 1996 and angioplasty alone in 1999 to LAD by review of prior notes.   . 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 Crohn's disease ([place])   . CTS (carpal tunnel syndrome)    Mild  . Depression   . Diabetes mellitus without complication (HCC)    Type 2  . Hyperlipidemia   . Hypertension   . Kidney stone   . Optic neuritis     Past Surgical History:  Procedure Laterality Date  . COLONOSCOPY    . CORONARY ARTERY BYPASS GRAFT N/A 04/01/2018   Procedure: CORONARY ARTERY BYPASS GRAFTING (CABG) TIMES FOUR: (LIMA to LAD, CRYO VEIN to OM1, SVG to OM2, and SVG to PDA) with PARTIAL EVH/OPEN from RIGHT and LEFT GREATER SAPHENOUS VEIN and LEFT INTERNAL MAMMARY ARTERY;  Surgeon: [name], MD;  Location: [place];  Service: Open Heart Surgery;  Laterality: N/A;  . KNEE CARTILAGE SURGERY Right   . LEFT HEART CATH AND CORONARY ANGIOGRAPHY N/A 03/27/2018   Procedure: LEFT HEART CATH AND CORONARY ANGIOGRAPHY;  Surgeon: [name], MD;  Location: [facility] CV LAB;  Service: Cardiovascular;  Laterality: N/A;  . LOWER EXTREMITY ANGIOGRAPHY N/A 08/13/2016   Procedure: Lower Extremity Angiography;  Surgeon: [name], MD;  Location: [place] CV LAB;  Service: Cardiovascular;  Laterality: N/A;  . NASAL SINUS SURGERY    . PROSTATE SURGERY    . TEE WITHOUT CARDIOVERSION N/A 04/01/2018   Procedure: TRANSESOPHAGEAL ECHOCARDIOGRAM (TEE);  Surgeon: Lucianne Lei  Donney Rankins, MD;  Location: [place];  Service: Open Heart Surgery;  Laterality: N/A;     Current Outpatient Medications  Medication Sig Dispense Refill  . albuterol (PROVENTIL HFA;VENTOLIN HFA) 108 (90 Base) MCG/ACT inhaler Inhale 2 puffs into the lungs every 6 (six) hours as needed for wheezing. 1 Inhaler 0  . ascorbic acid (VITAMIN C) 500 MG tablet Take 500 mg by mouth 2 (two) times daily.    [name] aspirin EC 81 MG EC tablet Take 1 tablet (81 mg total) by mouth daily.    . carvedilol (COREG) 3.125 MG  tablet Take 1 tablet (3.125 mg total) by mouth daily. 30 tablet 1  . clopidogrel (PLAVIX) 75 MG tablet Take 1 tablet (75 mg total) by mouth daily. 30 tablet 1  . clotrimazole (LOTRIMIN) 1 % cream Apply 1 application topically 2 (two) times daily as needed.     . docusate sodium (COLACE) 100 MG capsule Take 100 mg by mouth 2 (two) times daily.    . ferrous sulfate 325 (65 FE) MG tablet Take 1 tablet (325 mg total) by mouth daily with breakfast. For one month then stop. If has constipation, may stop sooner. 30 tablet 3  . flunisolide (NASAREL) 29 MCG/ACT (0.025%) nasal spray Place 1 spray into the nose daily as needed for allergies.     [name] glipiZIDE (GLUCOTROL) 5 MG tablet Take 1 tablet (5 mg total) by mouth daily before breakfast. 30 tablet 5  . guaiFENesin (MUCINEX) 600 MG 12 hr tablet Take 1 tablet (600 mg total) by mouth 2 (two) times daily as needed.    . mesalamine (LIALDA) 1.2 g EC tablet Take 2.4 g by mouth daily with breakfast.    . metFORMIN (GLUCOPHAGE) 500 MG tablet Take 1 tablet (500 mg total) by mouth 2 (two) times daily with a meal. 180 tablet 1  . OLANZapine (ZYPREXA) 5 MG tablet Take by mouth. Takes one half every day    . PARoxetine (PAXIL) 40 MG tablet Take 60 mg by mouth at bedtime. Take 1 1/2 tablets at bedtime     . risperiDONE (RISPERDAL) 0.5 MG tablet Take 0.5 mg by mouth at bedtime. Take 1/2 tablets 1-2 times daily as needed for anxiety.    . simvastatin (ZOCOR) 80 MG tablet Take 1 tablet (80 mg total) by mouth at bedtime. 30 tablet 5  . traMADol (ULTRAM) 50 MG tablet Take 50 mg by mouth every 4-6 hours PRN severe pain. 30 tablet 0  . triamcinolone cream (KENALOG) 0.1 % Apply 1 application topically 2 (two) times daily. (Patient taking differently: Apply 1 application topically 2 (two) times daily as needed. ) 60 g 2  . pantoprazole (PROTONIX) 20 MG tablet Take 1 tablet (20 mg total) by mouth daily. 90 tablet 3   No current facility-administered medications for this visit.      Allergies:   Flomax [tamsulosin hcl] and Morphine and related    Social History:  The patient  reports that he quit smoking about 2 months ago. He has never used smokeless tobacco. He reports that he does not drink alcohol or use drugs.   Family History:  The patient's family history includes Colon cancer in his mother; Coronary artery disease in his father; Crohn's disease in his brother; Heart attack in his father; Hypertension in his mother and sister.    ROS:  Please see the history of present illness.   Otherwise, review of systems are positive for none.   All other systems are  reviewed and negative.    PHYSICAL EXAM: VS:  BP 114/62   Pulse 94   Ht 5\' 11"  (1.803 m)   Wt 164 lb (74.4 kg)   SpO2 98%   BMI 22.87 kg/m  , BMI Body mass index is 22.87 kg/m. GEN: Well nourished, well developed, in no acute distress HEENT: sclera anicteric Neck: no JVD, carotid bruits, or masses Cardiac: RRR; no murmurs, rubs, or gallops, no edema; sternal incision healing well without drainage or erythema.  Respiratory:  clear to auscultation bilaterally, normal work of breathing GI: soft, nontender, nondistended, + BS MS: no deformity or atrophy Skin: warm and dry, no rash; LE incisions are healing well. Residual single staple - removed without bleeding or skin separation Neuro:  Strength and sensation are intact Psych: anxious    EKG:  EKG is ordered today. The ekg ordered today demonstrates sinus rhythm with TWI in V2-5 which are new since discharge; no STE/D. QTc 440   Recent Labs: 09/03/2017: ALT 22 03/28/2018: TSH 1.707 04/02/2018: Magnesium 2.5 04/08/2018: Hemoglobin 9.5; Platelets 308 04/09/2018: BUN 34; Creatinine, Ser 1.58; Potassium 4.6; Sodium 134    Lipid Panel    Component Value Date/Time   CHOL 154 03/28/2018 0240   CHOL 178 09/03/2017 1037   TRIG 67 03/28/2018 0240   HDL 36 (L) 03/28/2018 0240   HDL 51 09/03/2017 1037   CHOLHDL 4.3 03/28/2018 0240   VLDL 13  03/28/2018 0240   LDLCALC 105 (H) 03/28/2018 0240   LDLCALC 114 (H) 09/03/2017 1037      Wt Readings from Last 3 Encounters:  04/21/18 164 lb (74.4 kg)  04/09/18 160 lb 4.4 oz (72.7 kg)  08/28/17 171 lb 6.4 oz (77.7 kg)      Other studies Reviewed: Additional studies/ records that were reviewed today include:   Echocardiogram 03/2018: Study Conclusions  - Left ventricle: The cavity size was normal. Wall thickness was   normal. Systolic function was normal. The estimated ejection   fraction was in the range of 60% to 65%. There is akinesis of the   mid-apicalanteroseptal myocardium. Doppler parameters are   consistent with abnormal left ventricular relaxation (grade 1   diastolic dysfunction). - Aortic valve: Trileaflet; mildly calcified leaflets. There was   trivial regurgitation. - Mitral valve: Mildly calcified annulus. There was trivial   regurgitation. - Left atrium: The atrium was at the upper limits of normal in   size. - Right atrium: Central venous pressure (est): 3 mm Hg. - Atrial septum: No defect or patent foramen ovale was identified. - Tricuspid valve: There was physiologic regurgitation. - Pulmonary arteries: Systolic pressure could not be accurately   estimated. - Pericardium, extracardiac: There was no pericardial effusion.  Left heart catheterization 03/27/18:  Prox RCA lesion is 80% stenosed.  Mid RCA-1 lesion is 80% stenosed.  Mid RCA-2 lesion is 90% stenosed.  Dist RCA lesion is 99% stenosed.  Prox Cx lesion is 80% stenosed.  Ost 2nd Mrg lesion is 70% stenosed.  Ost LAD to Prox LAD lesion is 95% stenosed.  Prox LAD lesion is 80% stenosed.  Ost 1st Diag lesion is 90% stenosed.  Dist LAD lesion is 80% stenosed.   Severe three-vessel coronary obstructive disease with 85% in-stent restenosis in the proximal LAD stent followed by 80% LAD stenosis, 90% first diagonal stenosis and 80% apical LAD stenosis; 80% proximal left circumflex stenosis  with 70% circumflex marginal stenosis; and calcified RCA with diffuse disease with stenoses of 80% proximal mid 90% before  the acute margin and 99% after the acute margin with extensive collateralization from the very proximal conus proximal branches supplying the distal RCA.  LVEDP 16 mmHg.  RECOMMENDATION: Surgical consultation for CABG revascularization.  High potency statin therapy get LDL less than 70.  The patient will be started on heparin 8 hours post sheath discontinuance.  Continue aspirin.  Will avoid P2 Y 12 inhibition due to need for CABG revascularization.    ASSESSMENT AND PLAN:   1. CAD s/p CABG: Patient presented to [facility] 03/26/18 with unstable angina, sent to [facility] for LHC which revealed severe 3-vessel CAD. He underwent CABG 88/82/80 without complications. Discharged home 04/09/18. He has done well since that time without anginal complaints.  - Continue aspirin, plavix, and statin - Continue carvedilol  2. HTN: BP was felt to be labile in the hospital. He was discharged home on carvedilol 3.125mg  BID. Home losartan discontinued given bump in Cr. BP stable at today's visit - Continue carvedilol 3.125mg  BID  3. Dyslipidemia: LDL 105, HDL 36 on lipids 03/28/18 - Continue simvastatin 80mg  daily  4. PAD: Follows with Dr. Gwenlyn Found. Has bilaterally occluded SFA - felt to not be a candidate for endovascular or surgical repair on LE angiography 07/2016..  - Continue aspirin and statin  5. DM type 2: A1C 6.9 03/26/18; at goal of <7 - Continue metformin and glipizide - Continue to encourage healthy dietary and lifestyle modifications to improve glucose control  6. GERD: on omeprazole - Will transition to pantoprazole 20mg  daily given decreased efficacy of plavix with omeprazole use.   7. CKD stage 3: Cr 1.5 on the day of discharge; baseline ~1.2.  - Will check BMET today for close monitoring of Cr.   8. Post-op anemia: Hgb 9.5 on the day of discharge. He has been  taking po iron supplements. - Will check CBC today to monitor for improvement in Hgb  9. Anxiety: has been worsening recently. Asking if okay to restart risperidone 0.25mg  at bedtime which was prescribed by the [place] to take as needed but limit use. [facility] should [facility] with [facility] alternative anxiety medications going forward.    Current medicines are reviewed at length with the patient today.  The patient does not have concerns regarding medicines.  The following changes have been made:  Will stop omeprazole and start pantoprazole; otherwise no medication changes  Labs/ tests ordered today include:   Orders Placed This Encounter  Procedures  . Basic metabolic panel  . CBC  . EKG 12-Lead     Disposition:   FU with Dr. Martinique in 3 months  Signed, Abigail Butts, PA-C  04/21/2018 3:30 PM

## 2018-04-21 NOTE — Patient Instructions (Signed)
Medication Instructions:  Stop Omeprazole. Start Protonix 20 mg daily. Restart Risperidone that you have at home. If you need a refill on your cardiac medications before your next appointment, please call your pharmacy.   Lab work: BMET, CBC today. If you have labs (blood work) drawn today and your tests are completely normal, you will receive your results only by: [name] MyChart Message (if you have MyChart) OR . A paper copy in the mail If you have any lab test that is abnormal or we need to change your treatment, we will call you to review the results.  Follow-Up: At [facility], you and your health needs are our priority.  As part of our continuing mission to provide you with exceptional heart care, we have created designated Provider Care Teams.  These Care Teams include your primary Cardiologist (physician) and Advanced Practice Providers (APPs -  Physician Assistants and Nurse Practitioners) who all work together to provide you with the care you need, when you need it. You will need a follow up appointment in 3 months. You may see Peter Martinique, MD or one of the following Advanced Practice Providers on your designated Care Team: [place], [place] . Fabian Sharp, PA-C

## 2018-04-22 LAB — BASIC METABOLIC PANEL
BUN/Creatinine Ratio: 21 (ref 10–24)
BUN: 29 mg/dL — AB (ref 8–27)
CO2: 20 mmol/L (ref 20–29)
Calcium: 9.3 mg/dL (ref 8.6–10.2)
Chloride: 102 mmol/L (ref 96–106)
Creatinine, Ser: 1.39 mg/dL — ABNORMAL HIGH (ref 0.76–1.27)
GFR calc Af Amer: 58 mL/min/{1.73_m2} — ABNORMAL LOW (ref >59)
GFR calc non Af Amer: 50 mL/min/{1.73_m2} — ABNORMAL LOW (ref >59)
Glucose: 67 mg/dL (ref 65–99)
Potassium: 4.5 mmol/L (ref 3.5–5.2)
Sodium: 138 mmol/L (ref 134–144)

## 2018-04-22 LAB — CBC
Hematocrit: 31.4 % — ABNORMAL LOW (ref 37.5–51.0)
Hemoglobin: 10.5 g/dL — ABNORMAL LOW (ref 13.0–17.7)
MCH: 30.5 pg (ref 26.6–33.0)
MCHC: 33.4 g/dL (ref 31.5–35.7)
MCV: 91 fL (ref 79–97)
Platelets: 388 10*3/uL (ref 150–450)
RBC: 3.44 x10E6/uL — AB (ref 4.14–5.80)
RDW: 14.4 % (ref 12.3–15.4)
WBC: 8.9 10*3/uL (ref 3.4–10.8)

## 2018-04-24 ENCOUNTER — Telehealth: Payer: Self-pay | Admitting: Cardiology

## 2018-04-24 NOTE — Telephone Encounter (Signed)
Spoke with patient, he gave permission for me to give caregiver results, results given. Caregiver will give results to patient spouse and she will contact office if she has questions.

## 2018-04-24 NOTE — Telephone Encounter (Signed)
New Message:   Patient stated that some one called him concerning results;

## 2018-04-28 DIAGNOSIS — Z951 Presence of aortocoronary bypass graft: Secondary | ICD-10-CM

## 2018-04-28 DIAGNOSIS — I1310 Hypertensive heart and chronic kidney disease without heart failure, with stage 1 through stage 4 chronic kidney disease, or unspecified chronic kidney disease: Secondary | ICD-10-CM

## 2018-04-28 DIAGNOSIS — I25110 Atherosclerotic heart disease of native coronary artery with unstable angina pectoris: Secondary | ICD-10-CM

## 2018-04-28 DIAGNOSIS — Z48812 Encounter for surgical aftercare following surgery on the circulatory system: Secondary | ICD-10-CM

## 2018-04-28 DIAGNOSIS — E1122 Type 2 diabetes mellitus with diabetic chronic kidney disease: Secondary | ICD-10-CM

## 2018-05-01 DIAGNOSIS — E1151 Type 2 diabetes mellitus with diabetic peripheral angiopathy without gangrene: Secondary | ICD-10-CM | POA: Diagnosis not present

## 2018-05-01 DIAGNOSIS — I70203 Unspecified atherosclerosis of native arteries of extremities, bilateral legs: Secondary | ICD-10-CM | POA: Diagnosis not present

## 2018-05-01 DIAGNOSIS — Z951 Presence of aortocoronary bypass graft: Secondary | ICD-10-CM | POA: Diagnosis not present

## 2018-05-01 DIAGNOSIS — I25110 Atherosclerotic heart disease of native coronary artery with unstable angina pectoris: Secondary | ICD-10-CM | POA: Diagnosis not present

## 2018-05-01 DIAGNOSIS — Z4802 Encounter for removal of sutures: Secondary | ICD-10-CM | POA: Diagnosis not present

## 2018-05-01 DIAGNOSIS — I1310 Hypertensive heart and chronic kidney disease without heart failure, with stage 1 through stage 4 chronic kidney disease, or unspecified chronic kidney disease: Secondary | ICD-10-CM | POA: Diagnosis not present

## 2018-05-01 DIAGNOSIS — Z48812 Encounter for surgical aftercare following surgery on the circulatory system: Secondary | ICD-10-CM | POA: Diagnosis not present

## 2018-05-01 DIAGNOSIS — N183 Chronic kidney disease, stage 3 (moderate): Secondary | ICD-10-CM | POA: Diagnosis not present

## 2018-05-01 DIAGNOSIS — E1122 Type 2 diabetes mellitus with diabetic chronic kidney disease: Secondary | ICD-10-CM | POA: Diagnosis not present

## 2018-05-14 ENCOUNTER — Ambulatory Visit (INDEPENDENT_AMBULATORY_CARE_PROVIDER_SITE_OTHER): Payer: Self-pay | Admitting: Cardiothoracic Surgery

## 2018-05-14 ENCOUNTER — Encounter: Payer: Self-pay | Admitting: Cardiothoracic Surgery

## 2018-05-14 ENCOUNTER — Other Ambulatory Visit: Payer: Self-pay

## 2018-05-14 ENCOUNTER — Ambulatory Visit
Admission: RE | Admit: 2018-05-14 | Discharge: 2018-05-14 | Disposition: A | Discharge status: Home / Self Care | Payer: Medicare HMO | Source: Ambulatory Visit | Attending: Cardiothoracic Surgery | Admitting: Cardiothoracic Surgery

## 2018-05-14 ENCOUNTER — Other Ambulatory Visit: Payer: Self-pay | Admitting: Cardiothoracic Surgery

## 2018-05-14 VITALS — BP 125/82 | HR 99 | Resp 18 | Ht 71.0 in | Wt 163.8 lb

## 2018-05-14 DIAGNOSIS — Z951 Presence of aortocoronary bypass graft: Secondary | ICD-10-CM | POA: Diagnosis not present

## 2018-05-14 DIAGNOSIS — I739 Peripheral vascular disease, unspecified: Secondary | ICD-10-CM

## 2018-05-14 NOTE — Progress Notes (Signed)
Order placed in Epic for [place] Phase II.

## 2018-05-14 NOTE — Progress Notes (Signed)
PCP is [name], MD Referring Provider is Martinique, Isaak Delmundo M, MD  Chief Complaint  Patient presents with  . Routine Post Op    f/u with chest xray, s/p CABG 04/01/2018    HPI: Schedule visit 1 month after urgent CABG x4 for non-STEMI and unstable angina.  Patient needed 1 bypass graft using cryopreserved saphenous vein so he is on long-term Plavix as well as 81 mg aspirin.  He has history of PTSD and depression which has affected his recovery but overall he is progressing fairly well at this point.  He is ready to start outpatient cardiac rehab and will be referred to [facility].  His incisions are healing well.  His chest x-ray is clear.  He is in sinus rhythm.  The patient denies recurrent angina or symptoms of CHF.  Past Medical History:  Diagnosis Date  . Adenocarcinoma of prostate ([place])   . Anxiety   . CAD (coronary artery disease)    a. s/p prior stenting of LAD in 1996 and angioplasty alone in 1999 to LAD by review of prior notes.   . 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 Crohn's disease ([place])   . CTS (carpal tunnel syndrome)    Mild  . Depression   . Diabetes mellitus without complication (HCC)    Type 2  . Hyperlipidemia   . Hypertension   . Kidney stone   . Optic neuritis     Past Surgical History:  Procedure Laterality Date  . COLONOSCOPY    . CORONARY ARTERY BYPASS GRAFT N/A 04/01/2018   Procedure: CORONARY ARTERY BYPASS GRAFTING (CABG) TIMES FOUR: (LIMA to LAD, CRYO VEIN to OM1, SVG to OM2, and SVG to PDA) with PARTIAL EVH/OPEN from RIGHT and LEFT GREATER SAPHENOUS VEIN and LEFT INTERNAL MAMMARY ARTERY;  Surgeon: [name], MD;  Location: [place];  Service: Open Heart Surgery;  Laterality: N/A;  . KNEE CARTILAGE SURGERY Right   . LEFT HEART CATH AND CORONARY ANGIOGRAPHY N/A 03/27/2018   Procedure: LEFT HEART CATH AND CORONARY ANGIOGRAPHY;  Surgeon: [name], MD;  Location: [place] CV LAB;  Service: Cardiovascular;  Laterality: N/A;  . LOWER EXTREMITY  ANGIOGRAPHY N/A 08/13/2016   Procedure: Lower Extremity Angiography;  Surgeon: [name], MD;  Location: [place] CV LAB;  Service: Cardiovascular;  Laterality: N/A;  . NASAL SINUS SURGERY    . PROSTATE SURGERY    . TEE WITHOUT CARDIOVERSION N/A 04/01/2018   Procedure: TRANSESOPHAGEAL ECHOCARDIOGRAM (TEE);  Surgeon: [name], [name], MD;  Location: [place];  Service: Open Heart Surgery;  Laterality: N/A;    Family History  Problem Relation Age of Onset  . Hypertension Mother   . Colon cancer Mother   . Heart attack Father   . Coronary artery disease Father   . Hypertension Sister   . Crohn's disease Brother     Social History Social History   Tobacco Use  . Smoking status: Former Smoker    Last attempt to quit: 02/19/2018    Years since quitting: 0.2  . Smokeless tobacco: Never Used  Substance Use Topics  . Alcohol use: No  . Drug use: No    Current Outpatient Medications  Medication Sig Dispense Refill  . albuterol (PROVENTIL HFA;VENTOLIN HFA) 108 (90 Base) MCG/ACT inhaler Inhale 2 puffs into the lungs every 6 (six) hours as needed for wheezing. 1 Inhaler 0  . ascorbic acid (VITAMIN C) 500 MG tablet Take 500 mg by mouth 2 (two) times  daily.    . aspirin EC 81 MG EC tablet Take 1 tablet (81 mg total) by mouth daily.    . carvedilol (COREG) 3.125 MG tablet Take 1 tablet (3.125 mg total) by mouth daily. 30 tablet 1  . clopidogrel (PLAVIX) 75 MG tablet Take 1 tablet (75 mg total) by mouth daily. 30 tablet 1  . clotrimazole (LOTRIMIN) 1 % cream Apply 1 application topically 2 (two) times daily as needed.     . docusate sodium (COLACE) 100 MG capsule Take 100 mg by mouth 2 (two) times daily.    . flunisolide (NASAREL) 29 MCG/ACT (0.025%) nasal spray Place 1 spray into the nose daily as needed for allergies.     [name] glipiZIDE (GLUCOTROL) 5 MG tablet Take 1 tablet (5 mg total) by mouth daily before breakfast. 30 tablet 5  . mesalamine (LIALDA) 1.2 g EC tablet Take 2.4 g by  mouth daily with breakfast.    . metFORMIN (GLUCOPHAGE) 500 MG tablet Take 1 tablet (500 mg total) by mouth 2 (two) times daily with a meal. 180 tablet 1  . pantoprazole (PROTONIX) 20 MG tablet Take 1 tablet (20 mg total) by mouth daily. 90 tablet 3  . PARoxetine (PAXIL) 40 MG tablet Take 60 mg by mouth at bedtime. Take 1 1/2 tablets at bedtime     . risperiDONE (RISPERDAL) 0.5 MG tablet Take 0.5 mg by mouth at bedtime. Take 1/2 tablets 1-2 times daily as needed for anxiety.    . simvastatin (ZOCOR) 80 MG tablet Take 1 tablet (80 mg total) by mouth at bedtime. 30 tablet 5   No current facility-administered medications for this visit.     Allergies  Allergen Reactions  . Flomax [Tamsulosin Hcl]     UNSPECIFIED REACTION to HIGH DOSE   . Morphine And Related     UNSPECIFIED REACTION     Review of Systems   Postoperative problems with insomnia, poor appetite, emotional lability and depression, all of which are expected and will improve with time.  Patient complains of claudication in his left calf with walking.  His preoperative Dopplers indicated severe disease on the left with ABI of 0.4.  BP 125/82 (BP Location: Left Arm, Patient Position: Sitting, Cuff Size: Normal)   Pulse 99   Resp 18   Ht 5' 11"  (1.803 m)   Wt 163 lb 12.8 oz (74.3 kg)   SpO2 98% Comment: RA  BMI 22.85 kg/m  Physical Exam      Exam    General- alert and comfortable    Neck- no JVD, no cervical adenopathy palpable, no carotid bruit   Lungs- clear without rales, wheezes   Cor- regular rate and rhythm, no murmur , gallop   Abdomen- soft, non-tender   Extremities - warm, non-tender, minimal edema   Neuro- oriented, appropriate, no focal weakness   Diagnostic Tests: Chest x-ray image personally reviewed and is clear.  Sternal wires intact.  No pleural effusion. Impression: I told the patient he was approximately 50% recovered at this point and will not be fully recovered  for another 2 months.  He is  encouraged to start cardiac rehab.  He can drive now and lift up to 15-20 pounds but no more.  He can do normal activities around the home.  He should continue both the 81 mg aspirin and Plavix.  Plan: I will see him back in 2 months to assess his progress. We will refer him to [facility] for assessment of left lower leg  arterial disease  Len Childs, MD [facility] [phone]

## 2018-05-14 NOTE — Addendum Note (Signed)
Addended by: Charlena Cross F on: 05/14/2018 01:40 PM   Modules accepted: Orders

## 2018-05-26 ENCOUNTER — Encounter: Payer: Medicare HMO | Admitting: Vascular Surgery

## 2018-05-29 ENCOUNTER — Other Ambulatory Visit: Payer: Self-pay | Admitting: Family Medicine

## 2018-05-29 ENCOUNTER — Other Ambulatory Visit: Payer: Self-pay | Admitting: Physician Assistant

## 2018-05-30 NOTE — Telephone Encounter (Signed)
Needs to be maintained by specialist (I saw seeing vasc surg in several days)

## 2018-06-02 ENCOUNTER — Encounter: Payer: Self-pay | Admitting: Vascular Surgery

## 2018-06-02 ENCOUNTER — Ambulatory Visit: Payer: 59 | Admitting: Vascular Surgery

## 2018-06-02 VITALS — BP 138/84 | HR 91 | Temp 97.3°F | Resp 18 | Ht 71.0 in | Wt 170.0 lb

## 2018-06-02 DIAGNOSIS — I739 Peripheral vascular disease, unspecified: Secondary | ICD-10-CM

## 2018-06-02 NOTE — Progress Notes (Signed)
Vascular and Vein Specialist of [facility] office  Patient name: [name] MRN: [id number] DOB: Jan 13, 1946 Sex: male  REASON FOR CONSULT: Evaluation bilateral lower extremity intermittent claudication  HPI: [name] is a 73 y.o. male, who is seen today for discussion of intermittent claudication.  He is a very pleasant 73 year old gentleman who recently underwent coronary artery bypass grafting in December 2019.  He is done extremely well following his recovery.  He does report a long history of calf claudication.  He reports burning and stinging sensation in both calves with extensive walking.  He reports that this is moderately limiting to him.  He is able to do his routine activities.  He has no rest pain and no tissue loss.  He had undergone formal lower extremity arteriography with Dr. Gwenlyn Found in 2018.  This revealed occlusion of the superficial femoral at the origin with reconstitution of a diseased popliteal with tibial disease as well.  Did quit smoking last October.  Past Medical History:  Diagnosis Date  . Adenocarcinoma of prostate ([place])   . Anxiety   . CAD (coronary artery disease)    a. s/p prior stenting of LAD in 1996 and angioplasty alone in 1999 to LAD by review of prior notes.   . 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 Crohn's disease ([place])   . CTS (carpal tunnel syndrome)    Mild  . Depression   . Diabetes mellitus without complication (HCC)    Type 2  . Hyperlipidemia   . Hypertension   . Kidney stone   . Optic neuritis     Family History  Problem Relation Age of Onset  . Hypertension Mother   . Colon cancer Mother   . Heart attack Father   . Coronary artery disease Father   . Hypertension Sister   . Crohn's disease Brother     SOCIAL HISTORY: Social History   Socioeconomic History  . Marital status: Married    Spouse name: Not on file  . Number of children: Not on file  . Years of  education: 1  . Highest education level: Not on file  Occupational History  . Occupation: Dealer  Social Needs  . Financial resource strain: Not on file  . Food insecurity:    Worry: Not on file    Inability: Not on file  . Transportation needs:    Medical: Not on file    Non-medical: Not on file  Tobacco Use  . Smoking status: Former Smoker    Last attempt to quit: 02/19/2018    Years since quitting: 0.2  . Smokeless tobacco: Never Used  Substance and Sexual Activity  . Alcohol use: No  . Drug use: No  . Sexual activity: Not on file  Lifestyle  . Physical activity:    Days per week: Not on file    Minutes per session: Not on file  . Stress: Not on file  Relationships  . Social connections:    Talks on phone: Not on file    Gets together: Not on file    Attends religious service: Not on file    Active member of club or organization: Not on file    Attends meetings of clubs or organizations: Not on file    Relationship status: Not on file  . Intimate partner violence:    Fear of current or ex partner: Not on file    Emotionally abused: Not on file    Physically abused:  Not on file    Forced sexual activity: Not on file  Other Topics Concern  . Not on file  Social History Narrative  . Not on file    Allergies  Allergen Reactions  . Flomax [Tamsulosin Hcl]     UNSPECIFIED REACTION to HIGH DOSE   . Morphine And Related     UNSPECIFIED REACTION     Current Outpatient Medications  Medication Sig Dispense Refill  . albuterol (PROVENTIL HFA;VENTOLIN HFA) 108 (90 Base) MCG/ACT inhaler Inhale 2 puffs into the lungs every 6 (six) hours as needed for wheezing. 1 Inhaler 0  . ascorbic acid (VITAMIN C) 500 MG tablet Take 500 mg by mouth 2 (two) times daily.    [name] aspirin EC 81 MG EC tablet Take 1 tablet (81 mg total) by mouth daily.    . carvedilol (COREG) 3.125 MG tablet Take 1 tablet (3.125 mg total) by mouth daily. 30 tablet 1  . clopidogrel (PLAVIX) 75 MG tablet  Take 1 tablet (75 mg total) by mouth daily. 30 tablet 1  . clotrimazole (LOTRIMIN) 1 % cream Apply 1 application topically 2 (two) times daily as needed.     . docusate sodium (COLACE) 100 MG capsule Take 100 mg by mouth 2 (two) times daily.    . flunisolide (NASAREL) 29 MCG/ACT (0.025%) nasal spray Place 1 spray into the nose daily as needed for allergies.     [name] glipiZIDE (GLUCOTROL) 5 MG tablet Take 1 tablet (5 mg total) by mouth daily before breakfast. 30 tablet 5  . mesalamine (LIALDA) 1.2 g EC tablet Take 2.4 g by mouth daily with breakfast.    . metFORMIN (GLUCOPHAGE) 500 MG tablet Take 1 tablet (500 mg total) by mouth 2 (two) times daily with a meal. 180 tablet 1  . pantoprazole (PROTONIX) 20 MG tablet Take 1 tablet (20 mg total) by mouth daily. 90 tablet 3  . PARoxetine (PAXIL) 40 MG tablet Take 60 mg by mouth at bedtime. Take 1 1/2 tablets at bedtime     . risperiDONE (RISPERDAL) 0.5 MG tablet Take 0.5 mg by mouth at bedtime. Take 1/2 tablets 1-2 times daily as needed for anxiety.    . simvastatin (ZOCOR) 80 MG tablet Take 1 tablet (80 mg total) by mouth at bedtime. 30 tablet 5   No current facility-administered medications for this visit.     REVIEW OF SYSTEMS:  [X]  denotes positive finding, [ ]  denotes negative finding Cardiac  Comments:  Chest pain or chest pressure: x   Shortness of breath upon exertion: x   Short of breath when lying flat: x   Irregular heart rhythm: x       Vascular    Pain in calf, thigh, or hip brought on by ambulation: x   Pain in feet at night that wakes you up from your sleep:  x   Blood clot in your veins:    Leg swelling:         Pulmonary    Oxygen at home:    Productive cough:     Wheezing:         Neurologic    Sudden weakness in arms or legs:     Sudden numbness in arms or legs:     Sudden onset of difficulty speaking or slurred speech: x   Temporary loss of vision in one eye:  x   Problems with dizziness:  x       Gastrointestinal     Blood in  stool:     Vomited blood:         Genitourinary    Burning when urinating:     Blood in urine:        Psychiatric    Major depression:  x       Hematologic    Bleeding problems:    Problems with blood clotting too easily:        Skin    Rashes or ulcers:        Constitutional    Fever or chills:      PHYSICAL EXAM: Vitals:   06/02/18 0858  BP: 138/84  Pulse: 91  Resp: 18  Temp: (!) 97.3 F (36.3 C)  TempSrc: Temporal  Weight: 170 lb (77.1 kg)  Height: 5' 11"  (1.803 m)    GENERAL: The patient is a well-nourished male, in no acute distress. The vital signs are documented above. CARDIOVASCULAR: 2+ radial and 2+ femoral pulses bilaterally.  Absent popliteal and distal pulses PULMONARY: There is good air exchange  ABDOMEN: Soft and non-tender  MUSCULOSKELETAL: There are no major deformities or cyanosis. NEUROLOGIC: No focal weakness or paresthesias are detected. SKIN: There are no ulcers or rashes noted. PSYCHIATRIC: The patient has a normal affect.  DATA:  Noninvasive studies from 03/27/2018 prior to his coronary bypass revealed ankle arm index of 0.62 on the right and 0.43 on the left  Formal lower extremity arteriogram from April 2018 revealed no evidence of aortoiliac occlusive disease.  SFA occlusion at the origin down through the diseased popliteal bilaterally  MEDICAL ISSUES: Intermittent claudication related to superficial femoral artery occlusions bilaterally.  The patient has had no tissue loss.  He has no rest pain.  He is able to tolerate this level of ischemia.  I explained the critical importance of continued smoking cessation and a walking program.  He was relieved this discussion will see [place] again on an as-needed basis   Rosetta Posner, MD FACS [facility] and Vein Specialists of [facility] Tel [phone] Pager [phone]

## 2018-07-04 ENCOUNTER — Encounter (HOSPITAL_COMMUNITY): Payer: No Typology Code available for payment source

## 2018-07-16 ENCOUNTER — Encounter: Payer: No Typology Code available for payment source | Admitting: Cardiothoracic Surgery

## 2018-08-27 ENCOUNTER — Encounter: Payer: Medicare HMO | Admitting: Cardiothoracic Surgery

## 2018-09-12 ENCOUNTER — Telehealth: Payer: Self-pay | Admitting: Family Medicine

## 2018-09-12 MED ORDER — SIMVASTATIN 80 MG PO TABS: 80.0000 mg | ORAL_TABLET | Freq: Every day | ORAL | 5 refills | Status: DC

## 2018-09-12 NOTE — Telephone Encounter (Signed)
Wife returned call and verbalized understanding

## 2018-09-12 NOTE — Telephone Encounter (Signed)
Pt last seen 08/28/17 for DM. Please advise. Thank you

## 2018-09-12 NOTE — Telephone Encounter (Signed)
Medication sent in. Left message to return call 

## 2018-09-12 NOTE — Telephone Encounter (Signed)
Pt is needing one refill on simvastatin (ZOCOR) 80 MG tablet sent to pharmacy. He will not receive it from the [place] until July    [facility] [place] [place] [address]

## 2018-09-12 NOTE — Telephone Encounter (Signed)
Ok [facility]

## 2018-09-16 ENCOUNTER — Other Ambulatory Visit: Payer: Self-pay

## 2018-09-17 ENCOUNTER — Encounter: Payer: Self-pay | Admitting: Cardiothoracic Surgery

## 2018-09-17 ENCOUNTER — Ambulatory Visit (INDEPENDENT_AMBULATORY_CARE_PROVIDER_SITE_OTHER): Payer: Medicare HMO | Admitting: Cardiothoracic Surgery

## 2018-09-17 VITALS — BP 144/79 | HR 97 | Temp 97.9°F | Resp 16 | Ht 71.0 in | Wt 174.8 lb

## 2018-09-17 DIAGNOSIS — I2510 Atherosclerotic heart disease of native coronary artery without angina pectoris: Secondary | ICD-10-CM

## 2018-09-17 DIAGNOSIS — Z951 Presence of aortocoronary bypass graft: Secondary | ICD-10-CM | POA: Diagnosis not present

## 2018-09-17 MED ORDER — CLOPIDOGREL BISULFATE 75 MG PO TABS: 75.0000 mg | ORAL_TABLET | Freq: Every day | ORAL | 4 refills | Status: DC

## 2018-09-17 NOTE — Progress Notes (Signed)
PCP is [name], MD Referring Provider is [place], Oaklan Persons M, MD  Chief Complaint  Patient presents with  . Routine Post Op    2 month f/u s/p CABG X 4...04/01/18 to assess progress..he was evaluated by Dr. Donnetta Hutching for claudication/no treatment needed    HPI: Patient returns for final postop follow-up visit after urgent CABG 5 months ago.  He has no recurrent angina.  Last chest x-ray is clear.  Surgical incisions are well-healed.  His main complaint is depression-anxiety related to the COVID stay at home policies.  He does have some complaints of bilateral claudication and known bilateral SFA occlusion for which he has been evaluated by Dr. early.  Recommendation was for continued smoking cessation and a walking program.  He denies rest pain.  The patient is encouraged to follow a heart healthy lifestyle including 20 to 30 minutes of ambulation 5 days a week and a heart healthy diet.  He understands importance of continued total smoking cessation.   Past Medical History:  Diagnosis Date  . Adenocarcinoma of prostate ([place])   . Anxiety   . CAD (coronary artery disease)    a. s/p prior stenting of LAD in 1996 and angioplasty alone in 1999 to LAD by review of prior notes.   . 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 Crohn's disease ([place])   . CTS (carpal tunnel syndrome)    Mild  . Depression   . Diabetes mellitus without complication (HCC)    Type 2  . Hyperlipidemia   . Hypertension   . Kidney stone   . Optic neuritis     Past Surgical History:  Procedure Laterality Date  . COLONOSCOPY    . CORONARY ARTERY BYPASS GRAFT N/A 04/01/2018   Procedure: CORONARY ARTERY BYPASS GRAFTING (CABG) TIMES FOUR: (LIMA to LAD, CRYO VEIN to OM1, SVG to OM2, and SVG to PDA) with PARTIAL EVH/OPEN from RIGHT and LEFT GREATER SAPHENOUS VEIN and LEFT INTERNAL MAMMARY ARTERY;  Surgeon: [name], MD;  Location: [facility];  Service: Open Heart Surgery;  Laterality: N/A;  . KNEE CARTILAGE SURGERY Right   .  LEFT HEART CATH AND CORONARY ANGIOGRAPHY N/A 03/27/2018   Procedure: LEFT HEART CATH AND CORONARY ANGIOGRAPHY;  Surgeon: [name], MD;  Location: [facility];  Service: Cardiovascular;  Laterality: N/A;  . LOWER EXTREMITY ANGIOGRAPHY N/A 08/13/2016   Procedure: Lower Extremity Angiography;  Surgeon: [name], MD;  Location: [facility];  Service: Cardiovascular;  Laterality: N/A;  . NASAL SINUS SURGERY    . PROSTATE SURGERY    . TEE WITHOUT CARDIOVERSION N/A 04/01/2018   Procedure: TRANSESOPHAGEAL ECHOCARDIOGRAM (TEE);  Surgeon: [name], [name], MD;  Location: [facility];  Service: Open Heart Surgery;  Laterality: N/A;    Family History  Problem Relation Age of Onset  . Hypertension Mother   . Colon cancer Mother   . Heart attack Father   . Coronary artery disease Father   . Hypertension Sister   . Crohn's disease Brother     Social History Social History   Tobacco Use  . Smoking status: Former Smoker    Last attempt to quit: 02/19/2018    Years since quitting: 0.5  . Smokeless tobacco: Never Used  Substance Use Topics  . Alcohol use: No  . Drug use: No    Current Outpatient Medications  Medication Sig Dispense Refill  . albuterol (PROVENTIL HFA;VENTOLIN HFA) 108 (90 Base) MCG/ACT inhaler Inhale 2 puffs into  the lungs every 6 (six) hours as needed for wheezing. 1 Inhaler 0  . ascorbic acid (VITAMIN C) 500 MG tablet Take 500 mg by mouth 2 (two) times daily.    [name] aspirin EC 81 MG EC tablet Take 1 tablet (81 mg total) by mouth daily.    . carvedilol (COREG) 3.125 MG tablet Take 1 tablet (3.125 mg total) by mouth daily. 30 tablet 1  . clopidogrel (PLAVIX) 75 MG tablet Take 1 tablet (75 mg total) by mouth daily. 30 tablet 1  . clotrimazole (LOTRIMIN) 1 % cream Apply 1 application topically 2 (two) times daily as needed.     . docusate sodium (COLACE) 100 MG capsule Take 100 mg by mouth 2 (two) times daily.    . flunisolide (NASAREL) 29 MCG/ACT (0.025%)  nasal spray Place 1 spray into the nose daily as needed for allergies.     [name] glipiZIDE (GLUCOTROL) 5 MG tablet Take 1 tablet (5 mg total) by mouth daily before breakfast. 30 tablet 5  . mesalamine (LIALDA) 1.2 g EC tablet Take 2.4 g by mouth daily with breakfast.    . metFORMIN (GLUCOPHAGE) 500 MG tablet Take 1 tablet (500 mg total) by mouth 2 (two) times daily with a meal. 180 tablet 1  . mirtazapine (REMERON) 7.5 MG tablet Take 7.5 mg by mouth at bedtime.    . pantoprazole (PROTONIX) 20 MG tablet Take 1 tablet (20 mg total) by mouth daily. 90 tablet 3  . PARoxetine (PAXIL) 40 MG tablet Take 60 mg by mouth at bedtime. Take 1 1/2 tablets at bedtime     . risperiDONE (RISPERDAL) 0.5 MG tablet Take 0.5 mg by mouth at bedtime. Take 1/2 tablets 1-2 times daily as needed for anxiety.    . simvastatin (ZOCOR) 80 MG tablet Take 1 tablet (80 mg total) by mouth at bedtime. 30 tablet 5   No current facility-administered medications for this visit.     Allergies  Allergen Reactions  . Flomax [Tamsulosin Hcl]     UNSPECIFIED REACTION to HIGH DOSE   . Morphine And Related     UNSPECIFIED REACTION     Review of Systems  Gaining weight because of lack of exercise while isolated at home  BP (!) 144/79 (BP Location: Right Arm, Patient Position: Sitting, Cuff Size: Normal)   Pulse 97   Temp 97.9 F (36.6 C) (Skin)   Resp 16   Ht 5' 11"  (1.803 m)   Wt 174 lb 12.8 oz (79.3 kg)   SpO2 96% Comment: ON RA  BMI 24.38 kg/m  Physical Exam      Exam    General- alert and comfortable    Neck- no JVD, no cervical adenopathy palpable, no carotid bruit   Lungs- clear without rales, wheezes   Cor- regular rate and rhythm, no murmur , gallop   Abdomen- soft, non-tender   Extremities - warm, non-tender, minimal edema   Neuro- oriented, appropriate, no focal weakness   Diagnostic Tests: None Impression:   Plan: Patient doing well and has recovered from his CABGx4. Because of lack of conduit in his  leg and the use of cryopreserved saphenous vein for one  of the grafts he will remain on long-term Plavix.  He will return as needed.  No restrictions in activity.  Len Childs, MD [facility] [phone]

## 2018-10-17 ENCOUNTER — Ambulatory Visit: Payer: Self-pay | Admitting: Nurse Practitioner

## 2018-10-20 ENCOUNTER — Encounter: Payer: Self-pay | Admitting: Family Medicine

## 2018-10-20 ENCOUNTER — Other Ambulatory Visit: Payer: Self-pay

## 2018-10-20 ENCOUNTER — Ambulatory Visit (INDEPENDENT_AMBULATORY_CARE_PROVIDER_SITE_OTHER): Payer: Medicare HMO | Admitting: Family Medicine

## 2018-10-20 DIAGNOSIS — R1013 Epigastric pain: Secondary | ICD-10-CM | POA: Diagnosis not present

## 2018-10-20 MED ORDER — SUCRALFATE 1 G PO TABS: ORAL_TABLET | ORAL | 0 refills | Status: DC

## 2018-10-20 NOTE — Addendum Note (Signed)
Addended by: Carmelina Noun on: 10/20/2018 02:01 PM   Modules accepted: Orders

## 2018-10-20 NOTE — Progress Notes (Signed)
   Subjective:    Patient ID: [name], male    DOB: February 05, 1946, 73 y.o.   MRN: [id number] Audio Abdominal Pain This is a new problem. Quality: feels like gas pain. The pain is aggravated by eating. Treatments tried: pantoprazole.   Virtual Visit via Telephone Note  I connected with JERET GOYER on 10/20/18 at  1:10 PM EDT by telephone and verified that I am speaking with the correct person using two identifiers.  Location: Patient: home Provider: office   I discussed the limitations, risks, security and privacy concerns of performing an evaluation and management service by telephone and the availability of in person appointments. I also discussed with the patient that there may be a patient responsible charge related to this service. The patient expressed understanding and agreed to proceed.   History of Present Illness:    Observations/Objective:   Assessment and Plan:   Follow Up Instructions:    I discussed the assessment and treatment plan with the patient. The patient was provided an opportunity to ask questions and all were answered. The patient agreed with the plan and demonstrated an understanding of the instructions.   The patient was advised to call back or seek an in-person evaluation if the symptoms worsen or if the condition fails to improve as anticipated.  I provided 22 minutes of non-face-to-face time during this encounter.  Patient notes mid abdominal discomfort.  Generally after meals.  Burning at times.  Reminds him of prior reflux discomfort.  Currently on Protonix.  No change in bowel habit      Review of Systems  Gastrointestinal: Positive for abdominal pain.       Objective:   Physical Exam  Virtual      Assessment & Plan:  Impression progressive mid abdomen and epigastric discomfort.  Burning in nature.  Often after meals.  Not severe but aggravating.  Patient not up-to-date colonoscopy.  Does not want one.  Also  notes he goes to the [place] now for primary care and prevention management and diabetes management.  Will give trial of Carafate 1 g before meals and at bedtime.  If does not resolve needs to follow-up with his primary care at the [facility] [place].

## 2019-02-10 IMAGING — DX DG CHEST 2V
2 series · 2 of 2 positions shown · non-contrast
Comparison: 07/05/2016

CLINICAL DATA: Upper chest pain

EXAM:
CHEST - 2 VIEW

[chest pa]
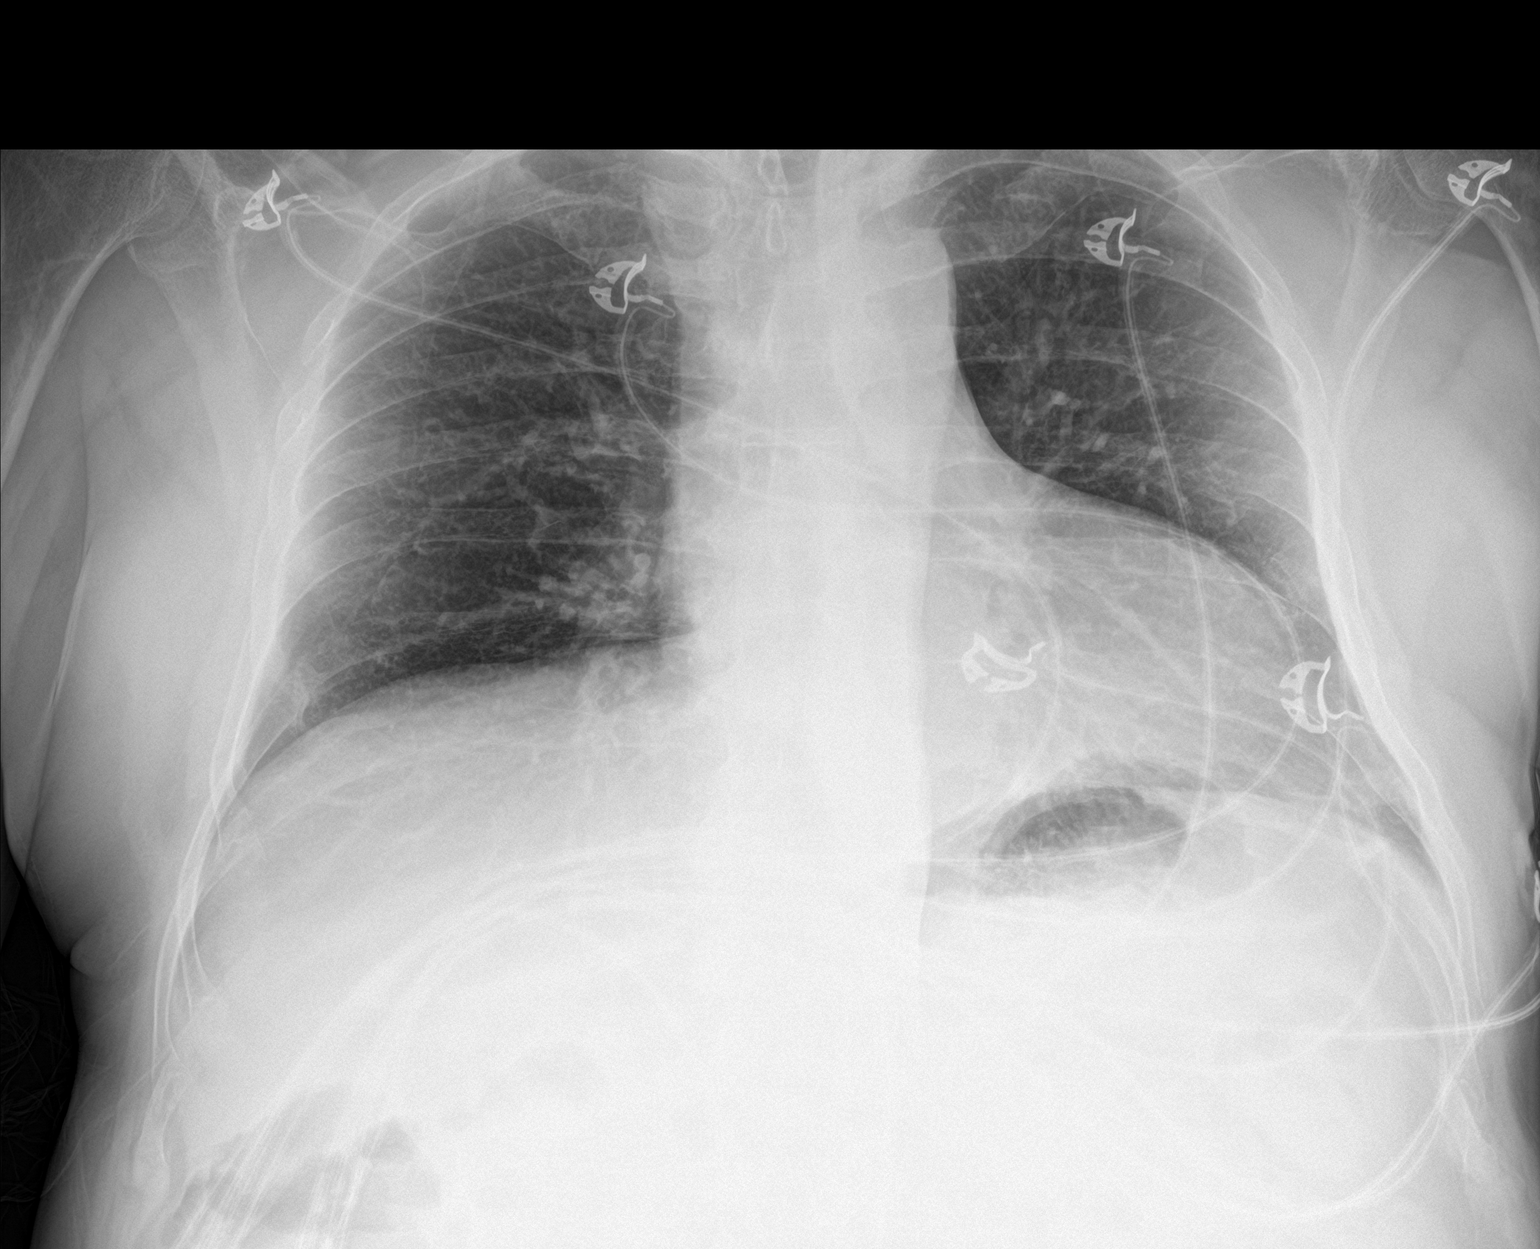

[chest lat]
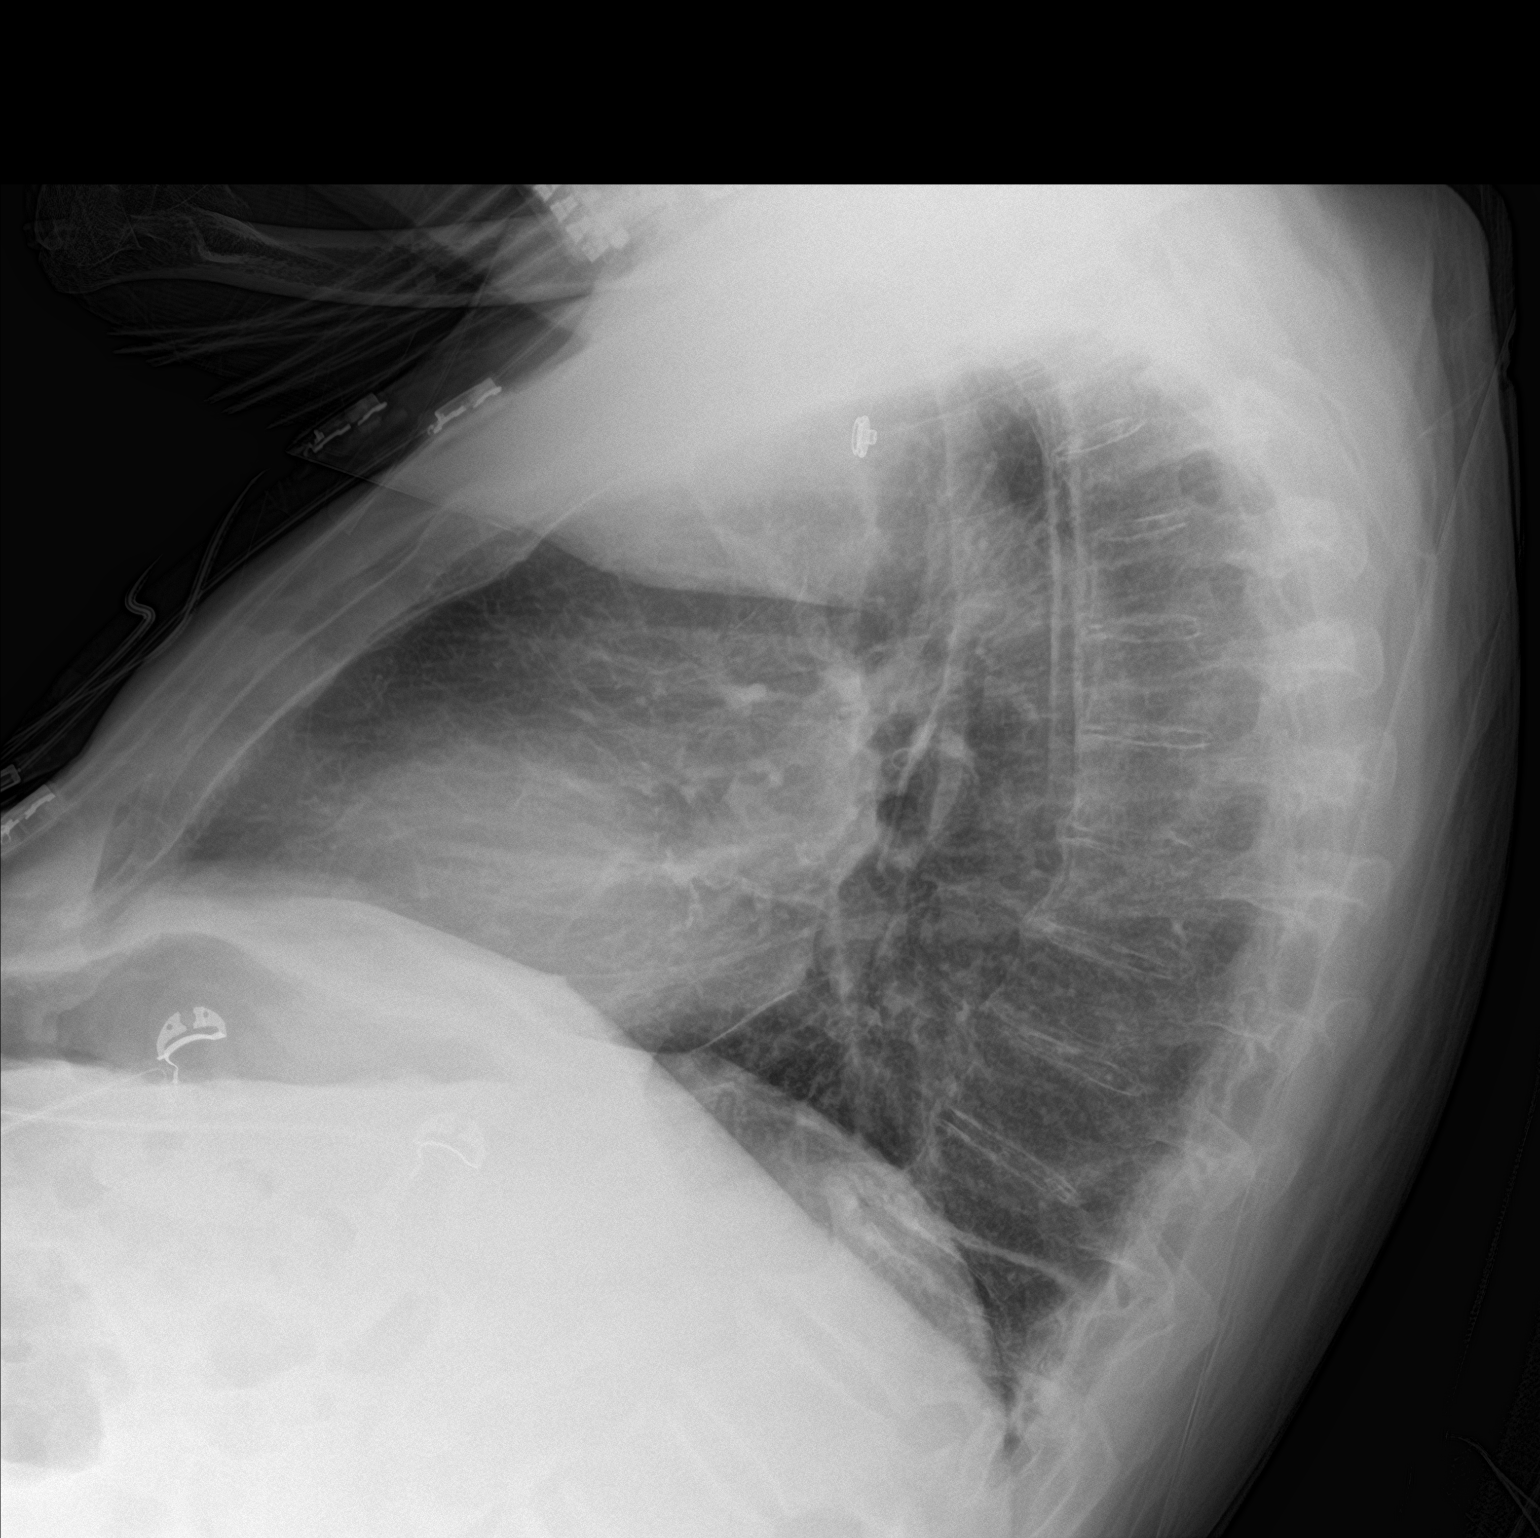

[2 of 2 positions shown; findings below may reference images not displayed]

FINDINGS: Low lung volumes. Heart size accentuated by the low volumes and AP
nature of the study, within normal limits. No confluent airspace
opacities or effusions. No acute bony abnormality.
IMPRESSION: Low lung volumes.  No active disease.

## 2019-03-31 IMAGING — CR DG CHEST 2V
2 series · 2 of 2 positions shown · non-contrast
Comparison: PA and lateral chest 04/06/2018.

CLINICAL DATA: Status post CABG 04/01/2018.

EXAM:
CHEST - 2 VIEW

[w chest pa]
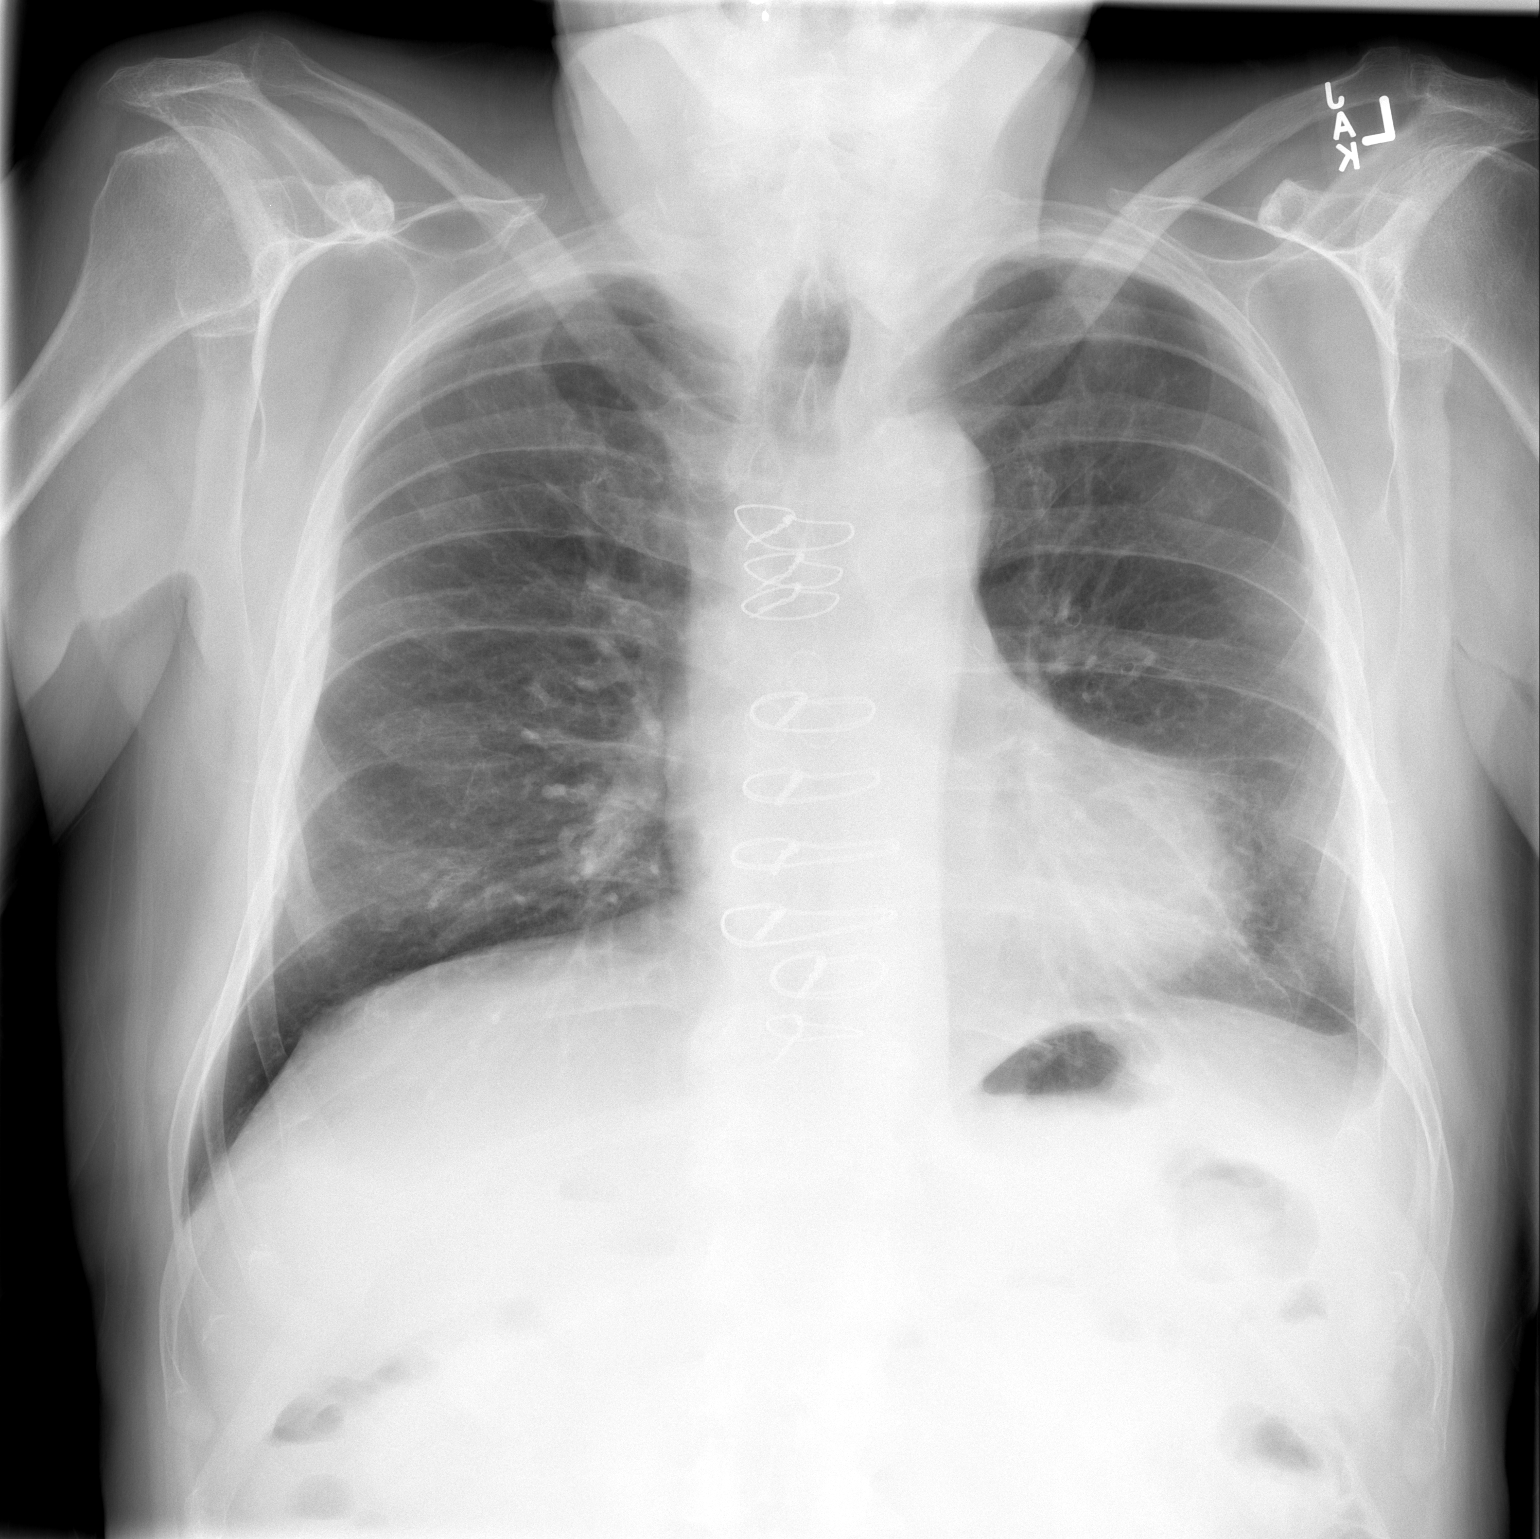

[w chest lat]
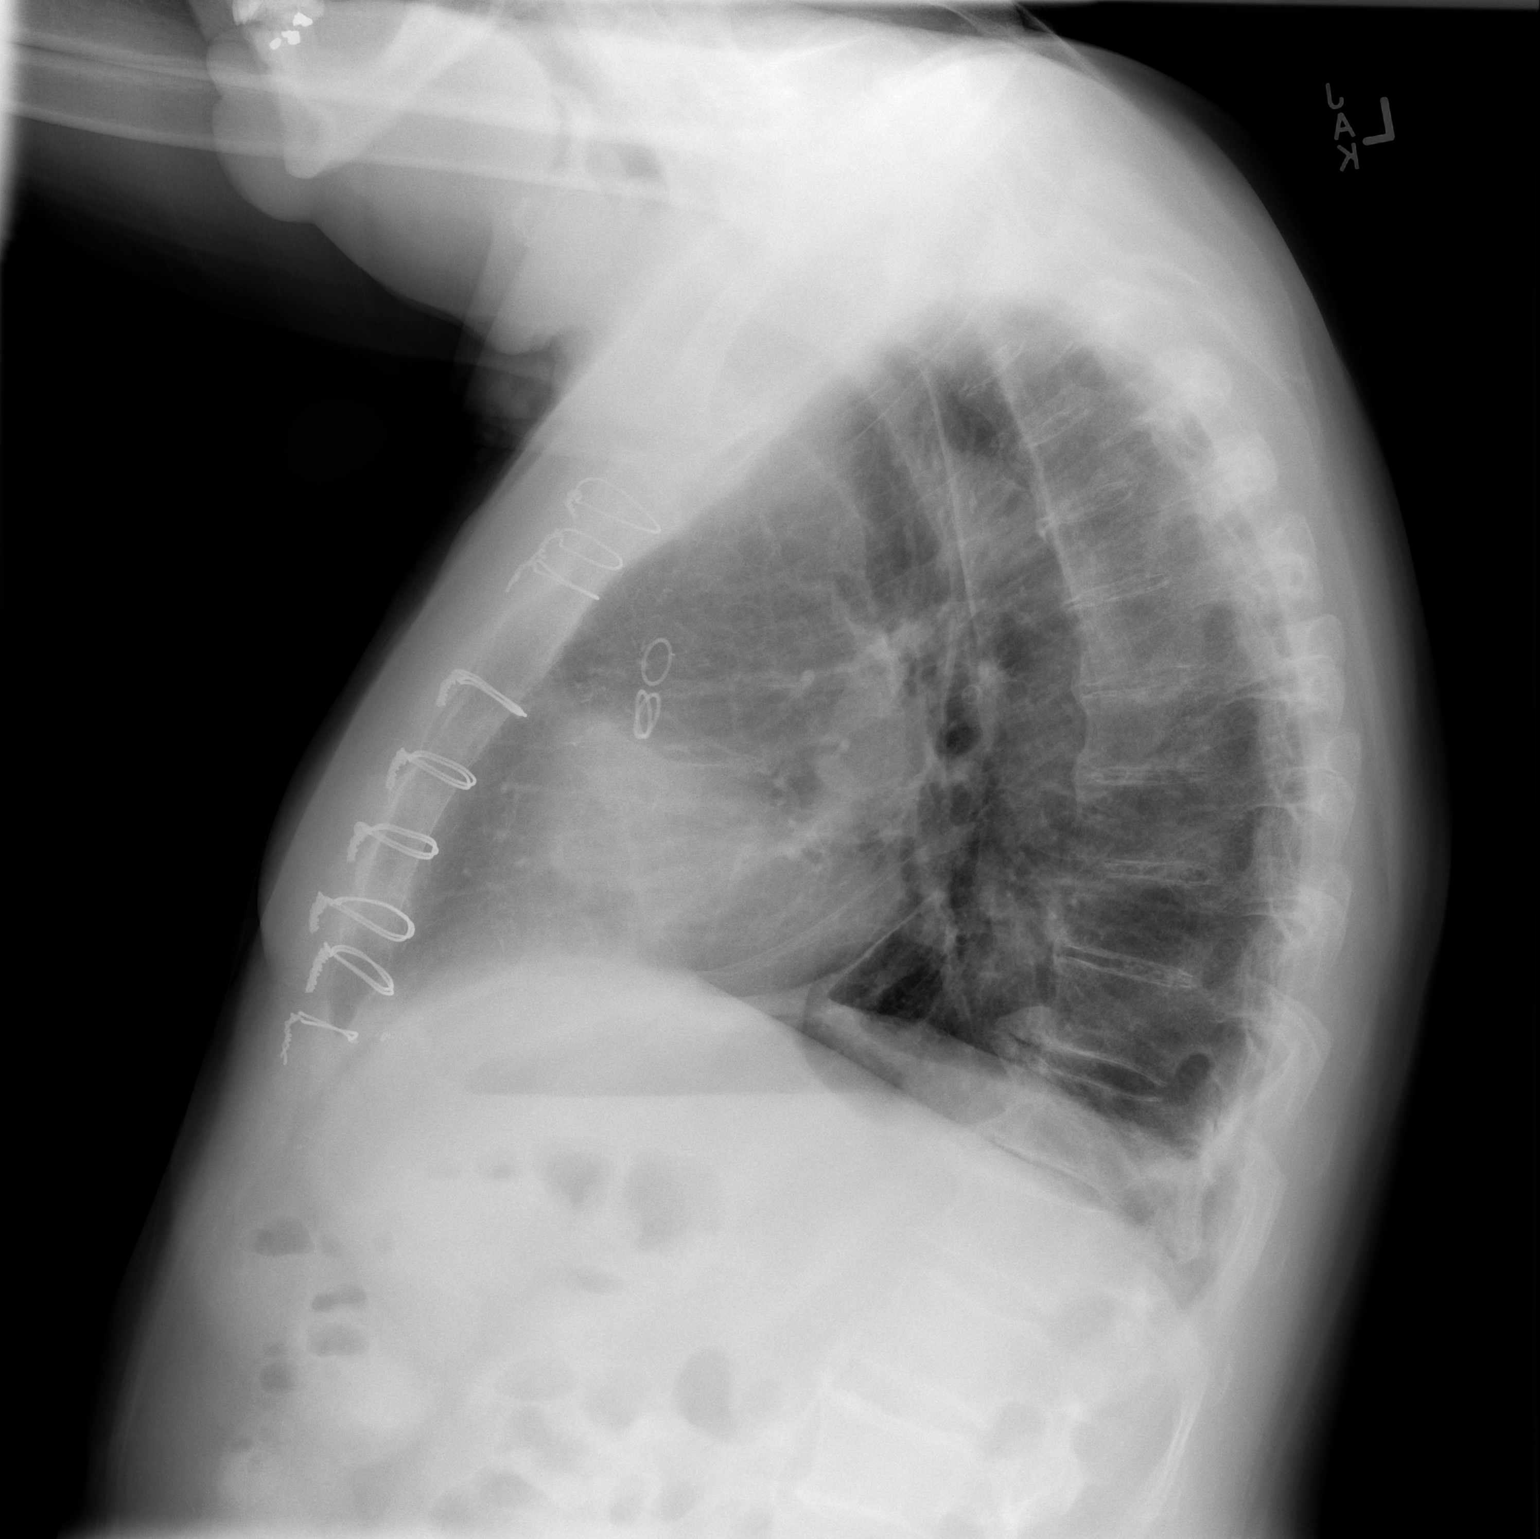

[2 of 2 positions shown; findings below may reference images not displayed]

FINDINGS: Median sternotomy wires are intact and unchanged. Lungs are clear.
No pneumothorax or pleural effusion. Heart size is normal. No acute
or focal bony abnormality.
IMPRESSION: Status post CABG.  No acute disease.

## 2019-05-28 ENCOUNTER — Encounter: Payer: Self-pay | Admitting: Family Medicine

## 2019-05-28 DIAGNOSIS — H524 Presbyopia: Secondary | ICD-10-CM | POA: Diagnosis not present

## 2019-05-28 DIAGNOSIS — H5203 Hypermetropia, bilateral: Secondary | ICD-10-CM | POA: Diagnosis not present

## 2019-05-28 DIAGNOSIS — H52209 Unspecified astigmatism, unspecified eye: Secondary | ICD-10-CM | POA: Diagnosis not present

## 2019-06-19 ENCOUNTER — Telehealth: Payer: Self-pay | Admitting: Family Medicine

## 2019-06-19 NOTE — Telephone Encounter (Signed)
Patient's wife is wanting to come with him to his appointment because he cant hear or see well. Please advise

## 2019-06-19 NOTE — Telephone Encounter (Signed)
Has appt on Monday 3/1

## 2019-06-19 NOTE — Telephone Encounter (Signed)
Dr Richardson Landry not here to ask but I was told if pt needed assistance it would be fine and it sounds like he does need assistance

## 2019-06-19 NOTE — Telephone Encounter (Signed)
Wife called for pt, requesting referral to endocrinology   Wife states his blood sugars have been up & down past couple weeks  Please advise - OK to refer or NTBS?  Last diabetes check up here was 08/25/2017

## 2019-06-19 NOTE — Telephone Encounter (Signed)
We will wait until o v so we can clarify then all of the details to know exactly [name] is going on

## 2019-06-22 ENCOUNTER — Ambulatory Visit: Payer: Medicare HMO | Admitting: Family Medicine

## 2019-11-02 NOTE — Progress Notes (Signed)
Cardiology Office Note   Date:  11/03/2019   ID:  [name], [name] 1945-11-08, MRN [id number]  PCP:  Erven Colla, DO  Cardiologist:  Jessey Stehlin Martinique, MD EP: None  Chief Complaint  Patient presents with  . Coronary Artery Disease      History of Present Illness: [name] is a 74 y.o. male with A PMH of CAD s/p CABG, HTN, HLD, PAD, DM type 2, Crohn's, GERD, prostate cancer s/p radioactive seeds, and prior tobacco abuse who is seen for follow up.    He presented to [facility] 03/26/18 with complaints of chest pain and SOB. Troponins were mildly elevated to 0.04 with EKG non-ischemic. Given concerns for unstable angina, he was transferred to [facility] where he underwent a LHC which showed severe 3-vessel CAD. Echo 03/26/18 showed EF 60-65%, G1DD, and akinesis of the mid-apical anteroseptal myocardium. He underwent CABG 88/50/27 without complications. 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 He was discharged from the hospital on 04/09/18.   Last seen in Feb 2020 by Dr Early for PAD. Known bilateral SFA occlusions. Diseased popliteal and tibial vessels as well.   On follow up today he is seen with his wife. He is not doing well. Feels very tired with no energy and is sleeping a lot. Legs give out with any activity. Basically doesn't do anything. He has off and on chest pain that he has had since his CABG. Labs checked at [facility]. Not sure how well sugars are controlled. He is not smoking.      Past Medical History:  Diagnosis Date  . Adenocarcinoma of prostate ([place])   . Anxiety   . CAD (coronary artery disease)    a. s/p prior stenting of LAD in 1996 and angioplasty alone in 1999 to LAD by review of prior notes.   . 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 Crohn's disease ([place])   . CTS (carpal tunnel syndrome)    Mild  . Depression   . Diabetes mellitus without complication (HCC)    Type 2   . Hyperlipidemia   . Hypertension   . Kidney stone   . Optic neuritis     Past Surgical History:  Procedure Laterality Date  . COLONOSCOPY    . CORONARY ARTERY BYPASS GRAFT N/A 04/01/2018   Procedure: CORONARY ARTERY BYPASS GRAFTING (CABG) TIMES FOUR: (LIMA to LAD, CRYO VEIN to OM1, SVG to OM2, and SVG to PDA) with PARTIAL EVH/OPEN from RIGHT and LEFT GREATER SAPHENOUS VEIN and LEFT INTERNAL MAMMARY ARTERY;  Surgeon: [name], MD;  Location: [place];  Service: Open Heart Surgery;  Laterality: N/A;  . KNEE CARTILAGE SURGERY Right   . LEFT HEART CATH AND CORONARY ANGIOGRAPHY N/A 03/27/2018   Procedure: LEFT HEART CATH AND CORONARY ANGIOGRAPHY;  Surgeon: [name], MD;  Location: [place] CV LAB;  Service: Cardiovascular;  Laterality: N/A;  . LOWER EXTREMITY ANGIOGRAPHY N/A 08/13/2016   Procedure: Lower Extremity Angiography;  Surgeon: [name], MD;  Location: [place] CV LAB;  Service: Cardiovascular;  Laterality: N/A;  . NASAL SINUS SURGERY    . PROSTATE SURGERY    . TEE WITHOUT CARDIOVERSION N/A 04/01/2018   Procedure: TRANSESOPHAGEAL ECHOCARDIOGRAM (TEE);  Surgeon: [name], [name], MD;  Location: [place];  Service: Open Heart Surgery;  Laterality: N/A;     Current Outpatient Medications  Medication Sig Dispense Refill  .  albuterol (PROVENTIL HFA;VENTOLIN HFA) 108 (90 Base) MCG/ACT inhaler Inhale 2 puffs into the lungs every 6 (six) hours as needed for wheezing. 1 Inhaler 0  . ascorbic acid (VITAMIN C) 500 MG tablet Take 500 mg by mouth 2 (two) times daily.    [name] aspirin EC 81 MG EC tablet Take 1 tablet (81 mg total) by mouth daily.    . clotrimazole (LOTRIMIN) 1 % cream Apply 1 application topically 2 (two) times daily as needed.     . docusate sodium (COLACE) 100 MG capsule Take 100 mg by mouth 2 (two) times daily.    . flunisolide (NASAREL) 29 MCG/ACT (0.025%) nasal spray Place 1 spray into the nose daily as needed for allergies.     [name] glipiZIDE (GLUCOTROL) 5  MG tablet Take 1 tablet (5 mg total) by mouth daily before breakfast. 30 tablet 5  . mesalamine (LIALDA) 1.2 g EC tablet Take 2.4 g by mouth daily with breakfast.    . metFORMIN (GLUCOPHAGE) 500 MG tablet Take 1 tablet (500 mg total) by mouth 2 (two) times daily with a meal. 180 tablet 1  . mirtazapine (REMERON) 7.5 MG tablet Take 7.5 mg by mouth at bedtime.    [name] PARoxetine (PAXIL) 40 MG tablet Take 60 mg by mouth at bedtime. Take 1 1/2 tablets at bedtime     . risperiDONE (RISPERDAL) 0.5 MG tablet Take 0.5 mg by mouth at bedtime. Take 1/2 tablets 1-2 times daily as needed for anxiety.    . simvastatin (ZOCOR) 80 MG tablet Take 1 tablet (80 mg total) by mouth at bedtime. 30 tablet 5  . sucralfate (CARAFATE) 1 g tablet Take one po AC and HS 120 tablet 0  . carvedilol (COREG) 3.125 MG tablet Take 1 tablet (3.125 mg total) by mouth daily. 30 tablet 1   No current facility-administered medications for this visit.    Allergies:   Flomax [tamsulosin hcl] and Morphine and related    Social History:  The patient  reports that he quit smoking about 20 months ago. He has never used smokeless tobacco. He reports that he does not drink alcohol and does not use drugs.   Family History:  The patient's family history includes Colon cancer in his mother; Coronary artery disease in his father; Crohn's disease in his brother; Heart attack in his father; Hypertension in his mother and sister.    ROS:  Please see the history of present illness.   Otherwise, review of systems are positive for none.   All other systems are reviewed and negative.    PHYSICAL EXAM: VS:  BP 140/70   Pulse 84   Temp (!) 96.9 F (36.1 C)   Resp (!) 97   Ht 5' 11"  (1.803 m)   Wt 169 lb 6.4 oz (76.8 kg)   BMI 23.63 kg/m  , BMI Body mass index is 23.63 kg/m. GEN: Well nourished, well developed, in no acute distress HEENT: sclera anicteric Neck: no JVD, carotid bruits, or masses Cardiac: RRR; no murmurs, rubs, or gallops, no  edema; sternal incision healing well without drainage or erythema.  Respiratory:  clear to auscultation bilaterally, normal work of breathing GI: soft, nontender, nondistended, + BS MS: no deformity or atrophy Skin: warm and dry, no rash; LE incisions are healing well. Pedal pulses are poor Neuro:  Strength and sensation are intact Psych: anxious    EKG:  EKG is ordered today. The ekg ordered today demonstrates sinus rhythm with rate 84. LVH. No acute  ST change. I have personally reviewed and interpreted this study.    Recent Labs: No results found for requested labs within last 8760 hours.    Lipid Panel    Component Value Date/Time   CHOL 154 03/28/2018 0240   CHOL 178 09/03/2017 1037   TRIG 67 03/28/2018 0240   HDL 36 (L) 03/28/2018 0240   HDL 51 09/03/2017 1037   CHOLHDL 4.3 03/28/2018 0240   VLDL 13 03/28/2018 0240   LDLCALC 105 (H) 03/28/2018 0240   LDLCALC 114 (H) 09/03/2017 1037      Wt Readings from Last 3 Encounters:  11/03/19 169 lb 6.4 oz (76.8 kg)  09/17/18 174 lb 12.8 oz (79.3 kg)  06/02/18 170 lb (77.1 kg)      Other studies Reviewed: Additional studies/ records that were reviewed today include:   Echocardiogram 03/2018: Study Conclusions  - Left ventricle: The cavity size was normal. Wall thickness was   normal. Systolic function was normal. The estimated ejection   fraction was in the range of 60% to 65%. There is akinesis of the   mid-apicalanteroseptal myocardium. Doppler parameters are   consistent with abnormal left ventricular relaxation (grade 1   diastolic dysfunction). - Aortic valve: Trileaflet; mildly calcified leaflets. There was   trivial regurgitation. - Mitral valve: Mildly calcified annulus. There was trivial   regurgitation. - Left atrium: The atrium was at the upper limits of normal in   size. - Right atrium: Central venous pressure (est): 3 mm Hg. - Atrial septum: No defect or patent foramen ovale was identified. -  Tricuspid valve: There was physiologic regurgitation. - Pulmonary arteries: Systolic pressure could not be accurately   estimated. - Pericardium, extracardiac: There was no pericardial effusion.  Left heart catheterization 03/27/18:  Prox RCA lesion is 80% stenosed.  Mid RCA-1 lesion is 80% stenosed.  Mid RCA-2 lesion is 90% stenosed.  Dist RCA lesion is 99% stenosed.  Prox Cx lesion is 80% stenosed.  Ost 2nd Mrg lesion is 70% stenosed.  Ost LAD to Prox LAD lesion is 95% stenosed.  Prox LAD lesion is 80% stenosed.  Ost 1st Diag lesion is 90% stenosed.  Dist LAD lesion is 80% stenosed.   Severe three-vessel coronary obstructive disease with 85% in-stent restenosis in the proximal LAD stent followed by 80% LAD stenosis, 90% first diagonal stenosis and 80% apical LAD stenosis; 80% proximal left circumflex stenosis with 70% circumflex marginal stenosis; and calcified RCA with diffuse disease with stenoses of 80% proximal mid 90% before the acute margin and 99% after the acute margin with extensive collateralization from the very proximal conus proximal branches supplying the distal RCA.  LVEDP 16 mmHg.  RECOMMENDATION: Surgical consultation for CABG revascularization.  High potency statin therapy get LDL less than 70.  The patient will be started on heparin 8 hours post sheath discontinuance.  Continue aspirin.  Will avoid P2 Y 12 inhibition due to need for CABG revascularization.    ASSESSMENT AND PLAN:   1. CAD s/p CABG: Patient presented to [facility] 03/26/18 with unstable angina, sent to [facility] for LHC which revealed severe 3-vessel CAD. He underwent CABG 00/86/76 without complications. This included LIMA to LAD, SVG to PDA, SVG to OM2 and cryopreserved SVG to OM1.  - Continue aspirin,  and statin - Continue carvedilol - now with intermittent chest pain and marked fatigue. Will check a Lexiscan myoview.   2. HTN: BP is controlled. On low dose Coreg - Continue  carvedilol 3.148m BID  3. Dyslipidemia: on high dose Zocor. Will request copy of recent labs from [place].   4. PAD:  Has bilaterally occluded SFA - felt to not be a candidate for endovascular or surgical repair on LE angiography 07/2016. - Continue aspirin and statin - will repeat LE arterial dopplers to see if there is any change.  5. DM type 2: again labs followed at [facility] - Continue metformin and glipizide - Continue to encourage healthy dietary and lifestyle modifications to improve glucose control - get labs from [place]  6. Marked fatigue. Will review lab work, dopplers and stress test results on follow up   7. History of tobacco abuse- now quit.   Current medicines are reviewed at length with the patient today.  The patient does not have concerns regarding medicines.  The following changes have been made:  Will stop omeprazole and start pantoprazole; otherwise no medication changes  Labs/ tests ordered today include:   Orders Placed This Encounter  Procedures  . Myocardial Perfusion Imaging  . EKG 12-Lead  . VAS [place] LOWER EXTREMITY ARTERIAL DUPLEX     Disposition:   FU with Dr. Martinique in 3 months  Signed, Malarie Tappen Martinique, MD  11/03/2019 10:51 AM

## 2019-11-03 ENCOUNTER — Encounter: Payer: Self-pay | Admitting: Cardiology

## 2019-11-03 ENCOUNTER — Ambulatory Visit (INDEPENDENT_AMBULATORY_CARE_PROVIDER_SITE_OTHER): Payer: Medicare HMO | Admitting: Cardiology

## 2019-11-03 ENCOUNTER — Other Ambulatory Visit: Payer: Self-pay

## 2019-11-03 VITALS — BP 140/70 | HR 84 | Temp 96.9°F | Resp 97 | Ht 71.0 in | Wt 169.4 lb

## 2019-11-03 DIAGNOSIS — E1151 Type 2 diabetes mellitus with diabetic peripheral angiopathy without gangrene: Secondary | ICD-10-CM

## 2019-11-03 DIAGNOSIS — I10 Essential (primary) hypertension: Secondary | ICD-10-CM

## 2019-11-03 DIAGNOSIS — E7800 Pure hypercholesterolemia, unspecified: Secondary | ICD-10-CM

## 2019-11-03 DIAGNOSIS — Z951 Presence of aortocoronary bypass graft: Secondary | ICD-10-CM | POA: Diagnosis not present

## 2019-11-03 DIAGNOSIS — I739 Peripheral vascular disease, unspecified: Secondary | ICD-10-CM

## 2019-11-03 DIAGNOSIS — I2584 Coronary atherosclerosis due to calcified coronary lesion: Secondary | ICD-10-CM | POA: Diagnosis not present

## 2019-11-03 DIAGNOSIS — R072 Precordial pain: Secondary | ICD-10-CM

## 2019-11-03 DIAGNOSIS — I2510 Atherosclerotic heart disease of native coronary artery without angina pectoris: Secondary | ICD-10-CM

## 2019-11-03 NOTE — Patient Instructions (Addendum)
Get me a copy of all your lab work from the [place]  We will schedule you for a Nuclear stress test Carlton Adam )  We will schedule you for lower extremity arterial dopplers to check your circulation.  Stop Plavix Continue other medications   Schedule follow up appointment after test

## 2019-11-11 ENCOUNTER — Telehealth (HOSPITAL_COMMUNITY): Payer: Self-pay

## 2019-11-11 NOTE — Telephone Encounter (Signed)
Encounter complete. 

## 2019-11-16 ENCOUNTER — Telehealth: Payer: Self-pay | Admitting: Cardiology

## 2019-11-16 NOTE — Telephone Encounter (Signed)
Pt c/o medication issue:  1. Name of Medication: aspirin EC 81 MG EC tablet  2. How are you currently taking this medication (dosage and times per day)? 1 tablet by mouth daily   3. Are you having a reaction (difficulty breathing--STAT)? No   4. What is your medication issue? [name] is calling wanting to know if [name] needs to hold his aspirin for his procedure tomorrow. She states the information they received in regards to it advises him to hold Plavix, but he is not currently on that medication just aspirin. Please advise.

## 2019-11-16 NOTE — Telephone Encounter (Signed)
Returned call to wife (ok per [facility]) aware no need to hold ASA for stress test.

## 2019-11-17 ENCOUNTER — Ambulatory Visit (HOSPITAL_COMMUNITY): Admission: RE | Admit: 2019-11-17 | Payer: Medicare HMO | Source: Ambulatory Visit

## 2019-11-18 ENCOUNTER — Other Ambulatory Visit: Payer: Self-pay | Admitting: Cardiology

## 2019-11-18 DIAGNOSIS — I739 Peripheral vascular disease, unspecified: Secondary | ICD-10-CM

## 2019-11-18 DIAGNOSIS — R072 Precordial pain: Secondary | ICD-10-CM

## 2019-11-18 DIAGNOSIS — E7800 Pure hypercholesterolemia, unspecified: Secondary | ICD-10-CM

## 2019-11-18 DIAGNOSIS — I10 Essential (primary) hypertension: Secondary | ICD-10-CM

## 2019-11-18 DIAGNOSIS — E1151 Type 2 diabetes mellitus with diabetic peripheral angiopathy without gangrene: Secondary | ICD-10-CM

## 2019-11-26 ENCOUNTER — Ambulatory Visit (HOSPITAL_COMMUNITY): Payer: Medicare HMO

## 2019-12-02 ENCOUNTER — Telehealth: Payer: Self-pay | Admitting: Cardiology

## 2019-12-02 NOTE — Telephone Encounter (Signed)
Agree. thanks

## 2019-12-02 NOTE — Telephone Encounter (Signed)
Pt c/o BP issue: STAT if pt c/o blurred vision, one-sided weakness or slurred speech  1. What are your last 5 BP readings? 11-29-19- 148/90  135/84- 11-30-19- 138/97 12-01-19- 153/104,  12-02-19  152/97  2. Are you having any other symptoms (ex. Dizziness, headache, blurred vision, passed out)? Headache and little dizziness sometimes  3. What is your BP issue?  Blood pressure is running high

## 2019-12-02 NOTE — Telephone Encounter (Signed)
Spoke to patient's wife she stated husband's B/P has been elevated.Reviewed medications with wife husband is taking Carvedilol 3/125 mg daily.Advised he should be taking 3.125 mg twice a day.Advised to take 3.125 mg twice a day.Continue to monitor B/P daily and call back in 2 weeks if B/P still elevated.I will make Dr.Jordan aware.

## 2019-12-15 MED ORDER — CARVEDILOL 12.5 MG PO TABS: 12.5000 mg | ORAL_TABLET | Freq: Two times a day (BID) | ORAL | 3 refills | Status: DC

## 2019-12-15 NOTE — Telephone Encounter (Signed)
    Pt c/o BP issue: STAT if pt c/o blurred vision, one-sided weakness or slurred speech  1. What are your last 5 BP readings? 178/81 HR 80  2. Are you having any other symptoms (ex. Dizziness, headache, blurred vision, passed out)? headache  3. What is your BP issue? Pt been taking   carvedilol (COREG) 3.125 MG tablet  2x a day and its not working, he said his BP is still high

## 2019-12-15 NOTE — Telephone Encounter (Signed)
We need to increase his Carvedilol to 12.5 mg bid.   Sonjia Wilcoxson Martinique MD, [facility]

## 2019-12-15 NOTE — Telephone Encounter (Signed)
Spoke to patient's wife she stated husband's B/P continues to be elevated.B/P ranging 178/81,177/80,174/88,145/94,181/69,159/87,141/93.Pulse 79 to 80's.Advised I will send message to [place].

## 2019-12-15 NOTE — Telephone Encounter (Signed)
Spoke to patient's wife Dr.Jordan's advice given.Advised to continue to monitor B/P and bring readings to Dr.Jordan's appointment 9/2.

## 2019-12-17 ENCOUNTER — Telehealth (HOSPITAL_COMMUNITY): Payer: Self-pay

## 2019-12-17 NOTE — Telephone Encounter (Signed)
Encounter complete. 

## 2019-12-21 NOTE — Progress Notes (Deleted)
Cardiology Office Note   Date:  12/21/2019   ID:  [name], [name] 02-21-1946, MRN [id number]  PCP:  Paul Colla, DO  Cardiologist:  Paul Wilensky Martin[place], MD EP: None  No chief complaint on file.     History of Present Illness: [name] is a 74 y.o. male with A PMH of CAD s/p CABG, HTN, HLD, PAD, DM type 2, Crohn's, GERD, prostate cancer s/p radioactive seeds, and prior tobacco abuse who is seen for follow up.    He presented to [name] 03/26/18 with complaints of chest pain and SOB. Troponins were mildly elevated to 0.04 with EKG non-ischemic. Given concerns for unstable angina, he was transferred to [name] where he underwent a LHC which showed severe 3-vessel CAD. Echo 03/26/18 showed EF 60-65%, G1DD, and akinesis of the mid-apical anteroseptal myocardium. He underwent CABG 02/40/97 without complications. 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 He was discharged from the hospital on 04/09/18.   Last seen in Feb 2020 by Dr Early for PAD. Known bilateral SFA occlusions. Diseased popliteal and tibial vessels as well.   On follow up today he is seen with his wife. He is not doing well. Feels very tired with no energy and is sleeping a lot. Legs give out with any activity. Basically doesn't do anything. He has off and on chest pain that he has had since his CABG. Labs checked at [name]. Not sure how well sugars are controlled. He is not smoking.   We had recommended an Lexiscan Myoview and repeat LE dopplers. The myoview study was normal.      Past Medical History:  Diagnosis Date  . Adenocarcinoma of prostate ([name])   . Anxiety   . CAD (coronary artery disease)    a. s/p prior stenting of LAD in 1996 and angioplasty alone in 1999 to LAD by review of prior notes.   . 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 Crohn's disease ([name])   . CTS (carpal tunnel syndrome)    Mild  . Depression   .  Diabetes mellitus without complication (HCC)    Type 2  . Hyperlipidemia   . Hypertension   . Kidney stone   . Optic neuritis     Past Surgical History:  Procedure Laterality Date  . COLONOSCOPY    . CORONARY ARTERY BYPASS GRAFT N/A 04/01/2018   Procedure: CORONARY ARTERY BYPASS GRAFTING (CABG) TIMES FOUR: (LIMA to LAD, CRYO VEIN to OM1, SVG to OM2, and SVG to PDA) with PARTIAL EVH/OPEN from RIGHT and LEFT GREATER SAPHENOUS VEIN and LEFT INTERNAL MAMMARY ARTERY;  Surgeon: Paul Poot, MD;  Location: [place];  Service: Open Heart Surgery;  Laterality: N/A;  . KNEE CARTILAGE SURGERY Right   . LEFT HEART CATH AND CORONARY ANGIOGRAPHY N/A 03/27/2018   Procedure: LEFT HEART CATH AND CORONARY ANGIOGRAPHY;  Surgeon: Paul Sine, MD;  Location: [place] CV LAB;  Service: Cardiovascular;  Laterality: N/A;  . LOWER EXTREMITY ANGIOGRAPHY N/A 08/13/2016   Procedure: Lower Extremity Angiography;  Surgeon: Paul Harp, MD;  Location: [place] CV LAB;  Service: Cardiovascular;  Laterality: N/A;  . NASAL SINUS SURGERY    . PROSTATE SURGERY    . TEE WITHOUT CARDIOVERSION N/A 04/01/2018   Procedure: TRANSESOPHAGEAL ECHOCARDIOGRAM (TEE);  Surgeon: [name], Paul Salina, MD;  Location: [place];  Service: Open Heart Surgery;  Laterality: N/A;  Current Outpatient Medications  Medication Sig Dispense Refill  . albuterol (PROVENTIL HFA;VENTOLIN HFA) 108 (90 Base) MCG/ACT inhaler Inhale 2 puffs into the lungs every 6 (six) hours as needed for wheezing. 1 Inhaler 0  . ascorbic acid (VITAMIN C) 500 MG tablet Take 500 mg by mouth 2 (two) times daily.    [name] aspirin EC 81 MG EC tablet Take 1 tablet (81 mg total) by mouth daily.    . carvedilol (COREG) 12.5 MG tablet Take 1 tablet (12.5 mg total) by mouth 2 (two) times daily. 180 tablet 3  . clotrimazole (LOTRIMIN) 1 % cream Apply 1 application topically 2 (two) times daily as needed.     . docusate sodium (COLACE) 100 MG capsule Take 100 mg by mouth 2  (two) times daily.    . flunisolide (NASAREL) 29 MCG/ACT (0.025%) nasal spray Place 1 spray into the nose daily as needed for allergies.     [name] glipiZIDE (GLUCOTROL) 5 MG tablet Take 1 tablet (5 mg total) by mouth daily before breakfast. 30 tablet 5  . mesalamine (LIALDA) 1.2 g EC tablet Take 2.4 g by mouth daily with breakfast.    . metFORMIN (GLUCOPHAGE) 500 MG tablet Take 1 tablet (500 mg total) by mouth 2 (two) times daily with a meal. 180 tablet 1  . mirtazapine (REMERON) 7.5 MG tablet Take 7.5 mg by mouth at bedtime.    [name] PARoxetine (PAXIL) 40 MG tablet Take 60 mg by mouth at bedtime. Take 1 1/2 tablets at bedtime     . risperiDONE (RISPERDAL) 0.5 MG tablet Take 0.5 mg by mouth at bedtime. Take 1/2 tablets 1-2 times daily as needed for anxiety.    . simvastatin (ZOCOR) 80 MG tablet Take 1 tablet (80 mg total) by mouth at bedtime. 30 tablet 5  . sucralfate (CARAFATE) 1 g tablet Take one po AC and HS 120 tablet 0   No current facility-administered medications for this visit.    Allergies:   Flomax [tamsulosin hcl] and Morphine and related    Social History:  The patient  reports that he quit smoking about 22 months ago. He has never used smokeless tobacco. He reports that he does not drink alcohol and does not use drugs.   Family History:  The patient's family history includes Colon cancer in his mother; Coronary artery disease in his father; Crohn's disease in his brother; Heart attack in his father; Hypertension in his mother and sister.    ROS:  Please see the history of present illness.   Otherwise, review of systems are positive for none.   All other systems are reviewed and negative.    PHYSICAL EXAM: VS:  There were no vitals taken for this visit. , BMI There is no height or weight on file to calculate BMI. GEN: Well nourished, well developed, in no acute distress HEENT: sclera anicteric Neck: no JVD, carotid bruits, or masses Cardiac: RRR; no murmurs, rubs, or gallops, no  edema; sternal incision healing well without drainage or erythema.  Respiratory:  clear to auscultation bilaterally, normal work of breathing GI: soft, nontender, nondistended, + BS MS: no deformity or atrophy Skin: warm and dry, no rash; LE incisions are healing well. Pedal pulses are poor Neuro:  Strength and sensation are intact Psych: anxious    EKG:  EKG is ordered today. The ekg ordered today demonstrates sinus rhythm with rate 84. LVH. No acute ST change. I have personally reviewed and interpreted this study.    Recent Labs:  No results found for requested labs within last 8760 hours.    Lipid Panel    Component Value Date/Time   CHOL 154 03/28/2018 0240   CHOL 178 09/03/2017 1037   TRIG 67 03/28/2018 0240   HDL 36 (L) 03/28/2018 0240   HDL 51 09/03/2017 1037   CHOLHDL 4.3 03/28/2018 0240   VLDL 13 03/28/2018 0240   LDLCALC 105 (H) 03/28/2018 0240   LDLCALC 114 (H) 09/03/2017 1037      Wt Readings from Last 3 Encounters:  11/03/19 169 lb 6.4 oz (76.8 kg)  09/17/18 174 lb 12.8 oz (79.3 kg)  06/02/18 170 lb (77.1 kg)      Other studies Reviewed: Additional studies/ records that were reviewed today include:   Echocardiogram 03/2018: Study Conclusions  - Left ventricle: The cavity size was normal. Wall thickness was   normal. Systolic function was normal. The estimated ejection   fraction was in the range of 60% to 65%. There is akinesis of the   mid-apicalanteroseptal myocardium. Doppler parameters are   consistent with abnormal left ventricular relaxation (grade 1   diastolic dysfunction). - Aortic valve: Trileaflet; mildly calcified leaflets. There was   trivial regurgitation. - Mitral valve: Mildly calcified annulus. There was trivial   regurgitation. - Left atrium: The atrium was at the upper limits of normal in   size. - Right atrium: Central venous pressure (est): 3 mm Hg. - Atrial septum: No defect or patent foramen ovale was identified. -  Tricuspid valve: There was physiologic regurgitation. - Pulmonary arteries: Systolic pressure could not be accurately   estimated. - Pericardium, extracardiac: There was no pericardial effusion.  Left heart catheterization 03/27/18:  Prox RCA lesion is 80% stenosed.  Mid RCA-1 lesion is 80% stenosed.  Mid RCA-2 lesion is 90% stenosed.  Dist RCA lesion is 99% stenosed.  Prox Cx lesion is 80% stenosed.  Ost 2nd Mrg lesion is 70% stenosed.  Ost LAD to Prox LAD lesion is 95% stenosed.  Prox LAD lesion is 80% stenosed.  Ost 1st Diag lesion is 90% stenosed.  Dist LAD lesion is 80% stenosed.   Severe three-vessel coronary obstructive disease with 85% in-stent restenosis in the proximal LAD stent followed by 80% LAD stenosis, 90% first diagonal stenosis and 80% apical LAD stenosis; 80% proximal left circumflex stenosis with 70% circumflex marginal stenosis; and calcified RCA with diffuse disease with stenoses of 80% proximal mid 90% before the acute margin and 99% after the acute margin with extensive collateralization from the very proximal conus proximal branches supplying the distal RCA.  LVEDP 16 mmHg.  RECOMMENDATION: Surgical consultation for CABG revascularization.  High potency statin therapy get LDL less than 70.  The patient will be started on heparin 8 hours post sheath discontinuance.  Continue aspirin.  Will avoid P2 Y 12 inhibition due to need for CABG revascularization.  Myoview 12/22/19:Study Highlights    Nuclear stress EF: 55%.  The left ventricular ejection fraction is normal (55-65%).  Blood pressure demonstrated a normal response to exercise.  There was no ST segment deviation noted during stress.  The study is normal.  This is a low risk study.   Normal resting and stress perfusion. No ischemia or infarction EF 55%    ASSESSMENT AND PLAN:   1. CAD s/p CABG: Patient presented to [facility] 03/26/18 with unstable angina, sent to [facility] for  LHC which revealed severe 3-vessel CAD. He underwent CABG 17/40/81 without complications. This included LIMA to LAD, SVG to PDA,  SVG to OM2 and cryopreserved SVG to OM1.  - Continue aspirin,  and statin - Continue carvedilol - now with intermittent chest pain and marked fatigue. Will check a Lexiscan myoview.   2. HTN: BP is controlled. On low dose Coreg - Continue carvedilol 3.181m BID  3. Dyslipidemia: on high dose Zocor. [place]   4. PAD:  Has bilaterally occluded SFA - felt to not be a candidate for endovascular or surgical repair on LE angiography 07/2016. - Continue aspirin and statin - will repeat LE arterial dopplers to see if there is any change.  5. DM type 2: [facility] Continue metformin and glipizide - Continue to encourage healthy dietary and lifestyle modifications to improve glucose control - [place]. Marked fatigue. Will review lab work, dopplers and stress test results on follow up   7. History of tobacco abuse- now quit.   Current medicines are reviewed at length with the patient today.  The patient does not have concerns regarding medicines.  The following changes have been made:  Will stop omeprazole and start pantoprazole; otherwise no medication changes  Labs/ tests ordered today include:   No orders of the defined types were placed in this encounter.    Disposition:   FU with Dr. JMartiniquein 3 months  Signed, Deryk Bozman JMartinique MD  12/21/2019 12:57 PM

## 2019-12-22 ENCOUNTER — Other Ambulatory Visit: Payer: Self-pay

## 2019-12-22 ENCOUNTER — Ambulatory Visit (HOSPITAL_COMMUNITY)
Admission: RE | Admit: 2019-12-22 | Discharge: 2019-12-22 | Disposition: A | Discharge status: Home / Self Care | Payer: Medicare HMO | Source: Ambulatory Visit | Attending: Cardiology | Admitting: Cardiology

## 2019-12-22 DIAGNOSIS — R072 Precordial pain: Secondary | ICD-10-CM | POA: Diagnosis not present

## 2019-12-22 LAB — MYOCARDIAL PERFUSION IMAGING
LV dias vol: 79 mL (ref 62–150)
LV sys vol: 35 mL
SDS: 0
SRS: 1
SSS: 1
TID: 1.02

## 2019-12-22 MED ORDER — TECHNETIUM TC 99M TETROFOSMIN IV KIT
9.4000 | PACK | Freq: Once | INTRAVENOUS | Status: AC | PRN
  Administered 2019-12-22: 9.4 via INTRAVENOUS
  Filled 2019-12-22: qty 10

## 2019-12-22 MED ORDER — AMINOPHYLLINE 25 MG/ML IV SOLN
75.0000 mg | Freq: Once | INTRAVENOUS | Status: AC
  Administered 2019-12-22: 75 mg via INTRAVENOUS

## 2019-12-22 MED ORDER — TECHNETIUM TC 99M TETROFOSMIN IV KIT
29.0000 | PACK | Freq: Once | INTRAVENOUS | Status: AC | PRN
  Administered 2019-12-22: 29 via INTRAVENOUS
  Filled 2019-12-22: qty 29

## 2019-12-22 MED ORDER — REGADENOSON 0.4 MG/5ML IV SOLN
0.4000 mg | Freq: Once | INTRAVENOUS | Status: AC
  Administered 2019-12-22: 0.4 mg via INTRAVENOUS

## 2019-12-24 ENCOUNTER — Ambulatory Visit (HOSPITAL_COMMUNITY): Payer: Medicare HMO

## 2019-12-24 ENCOUNTER — Ambulatory Visit: Payer: Medicare HMO | Admitting: Cardiology

## 2020-01-04 NOTE — Progress Notes (Signed)
Cardiology Office Note   Date:  01/06/2020   ID:  Deadrian, Toya [birth year], MRN [id number]  PCP:  Erven Colla, DO  Cardiologist:  Dustina Scoggin [place], MD EP: None  Chief Complaint  Patient presents with  . Coronary Artery Disease      History of Present Illness: [name] is a 74 y.o. male with A PMH of CAD s/p CABG, HTN, HLD, PAD, DM type 2, Crohn's, GERD, prostate cancer s/p radioactive seeds, and prior tobacco abuse who is seen for follow up.    He presented to [facility] 03/26/18 with complaints of chest pain and SOB. Troponins were mildly elevated to 0.04 with EKG non-ischemic. Given concerns for unstable angina, he was transferred to [facility] where he underwent a LHC which showed severe 3-vessel CAD. Echo 03/26/18 showed EF 60-65%, G1DD, and akinesis of the mid-apical anteroseptal myocardium. He underwent CABG 25/42/70 without complications. 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 He was discharged from the hospital on 04/09/18.   Last seen in Feb 2020 by Dr Early for PAD. Known bilateral SFA occlusions. Diseased popliteal and tibial vessels as well.   On follow up today he is seen with his wife. He still notes he doesn't want to do anything and so he doesn't. Sleeps a lot. No chest pain or dyspnea. Sugars 114-117. BP OK. He has lost some weight. Recent Myoview study was normal.     Past Medical History:  Diagnosis Date  . Adenocarcinoma of prostate ([place])   . Anxiety   . CAD (coronary artery disease)    a. s/p prior stenting of LAD in 1996 and angioplasty alone in 1999 to LAD by review of prior notes.   . 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 Crohn's disease ([place])   . CTS (carpal tunnel syndrome)    Mild  . Depression   . Diabetes mellitus without complication (HCC)    Type 2  . Hyperlipidemia   . Hypertension   . Kidney stone   . Optic neuritis     Past Surgical  History:  Procedure Laterality Date  . COLONOSCOPY    . CORONARY ARTERY BYPASS GRAFT N/A 04/01/2018   Procedure: CORONARY ARTERY BYPASS GRAFTING (CABG) TIMES FOUR: (LIMA to LAD, CRYO VEIN to OM1, SVG to OM2, and SVG to PDA) with PARTIAL EVH/OPEN from RIGHT and LEFT GREATER SAPHENOUS VEIN and LEFT INTERNAL MAMMARY ARTERY;  Surgeon: [name], MD;  Location: [place];  Service: Open Heart Surgery;  Laterality: N/A;  . KNEE CARTILAGE SURGERY Right   . LEFT HEART CATH AND CORONARY ANGIOGRAPHY N/A 03/27/2018   Procedure: LEFT HEART CATH AND CORONARY ANGIOGRAPHY;  Surgeon: [name], MD;  Location: [place] CV LAB;  Service: Cardiovascular;  Laterality: N/A;  . LOWER EXTREMITY ANGIOGRAPHY N/A 08/13/2016   Procedure: Lower Extremity Angiography;  Surgeon: [name], MD;  Location: [place] CV LAB;  Service: Cardiovascular;  Laterality: N/A;  . NASAL SINUS SURGERY    . PROSTATE SURGERY    . TEE WITHOUT CARDIOVERSION N/A 04/01/2018   Procedure: TRANSESOPHAGEAL ECHOCARDIOGRAM (TEE);  Surgeon: [name], [name], MD;  Location: [place];  Service: Open Heart Surgery;  Laterality: N/A;     Current Outpatient Medications  Medication Sig Dispense Refill  . albuterol (PROVENTIL HFA;VENTOLIN HFA) 108 (90 Base) MCG/ACT inhaler Inhale 2 puffs into the lungs every 6 (six) hours as needed  for wheezing. 1 Inhaler 0  . ascorbic acid (VITAMIN C) 500 MG tablet Take 500 mg by mouth 2 (two) times daily.    [name] aspirin EC 81 MG EC tablet Take 1 tablet (81 mg total) by mouth daily.    . carvedilol (COREG) 12.5 MG tablet Take 1 tablet (12.5 mg total) by mouth 2 (two) times daily. 180 tablet 3  . clotrimazole (LOTRIMIN) 1 % cream Apply 1 application topically 2 (two) times daily as needed.     . docusate sodium (COLACE) 100 MG capsule Take 100 mg by mouth 2 (two) times daily.    . flunisolide (NASAREL) 29 MCG/ACT (0.025%) nasal spray Place 1 spray into the nose daily as needed for allergies.     [name]  glipiZIDE (GLUCOTROL) 5 MG tablet Take 1 tablet (5 mg total) by mouth daily before breakfast. 30 tablet 5  . mesalamine (LIALDA) 1.2 g EC tablet Take 2.4 g by mouth daily with breakfast.    . metFORMIN (GLUCOPHAGE) 500 MG tablet Take 1 tablet (500 mg total) by mouth 2 (two) times daily with a meal. 180 tablet 1  . mirtazapine (REMERON) 7.5 MG tablet Take 7.5 mg by mouth at bedtime.    [name] PARoxetine (PAXIL) 40 MG tablet Take 60 mg by mouth at bedtime. Take 1 1/2 tablets at bedtime     . risperiDONE (RISPERDAL) 0.5 MG tablet Take 0.5 mg by mouth at bedtime. Take 1/2 tablets 1-2 times daily as needed for anxiety.    . simvastatin (ZOCOR) 80 MG tablet Take 1 tablet (80 mg total) by mouth at bedtime. 30 tablet 5  . sucralfate (CARAFATE) 1 g tablet Take one po AC and HS 120 tablet 0   No current facility-administered medications for this visit.    Allergies:   Flomax [tamsulosin hcl] and Morphine and related    Social History:  The patient  reports that he quit smoking about 22 months ago. He has never used smokeless tobacco. He reports that he does not drink alcohol and does not use drugs.   Family History:  The patient's family history includes Colon cancer in his mother; Coronary artery disease in his father; Crohn's disease in his brother; Heart attack in his father; Hypertension in his mother and sister.    ROS:  Please see the history of present illness.   Otherwise, review of systems are positive for none.   All other systems are reviewed and negative.    PHYSICAL EXAM: VS:  BP 140/78   Pulse 70   Temp (!) 97 F (36.1 C)   Ht 5' 11"  (1.803 m)   Wt 160 lb 6.4 oz (72.8 kg)   SpO2 96%   BMI 22.37 kg/m  , BMI Body mass index is 22.37 kg/m. GEN: Well nourished, well developed, in no acute distress HEENT: sclera anicteric Neck: no JVD, carotid bruits, or masses Cardiac: RRR; no murmurs, rubs, or gallops, no edema; sternal incision healing well without drainage or erythema.   Respiratory:  clear to auscultation bilaterally, normal work of breathing GI: soft, nontender, nondistended, + BS MS: no deformity or atrophy Skin: warm and dry, no rash; LE incisions are healing well. Pedal pulses are poor Neuro:  Strength and sensation are intact Psych: anxious    EKG:  EKG is not ordered today.    Recent Labs: No results found for requested labs within last 8760 hours.    Lipid Panel    Component Value Date/Time   CHOL 154 03/28/2018  0240   CHOL 178 09/03/2017 1037   TRIG 67 03/28/2018 0240   HDL 36 (L) 03/28/2018 0240   HDL 51 09/03/2017 1037   CHOLHDL 4.3 03/28/2018 0240   VLDL 13 03/28/2018 0240   LDLCALC 105 (H) 03/28/2018 0240   LDLCALC 114 (H) 09/03/2017 1037      Wt Readings from Last 3 Encounters:  01/06/20 160 lb 6.4 oz (72.8 kg)  12/22/19 169 lb (76.7 kg)  11/03/19 169 lb 6.4 oz (76.8 kg)      Other studies Reviewed: Additional studies/ records that were reviewed today include:   Echocardiogram 03/2018: Study Conclusions  - Left ventricle: The cavity size was normal. Wall thickness was   normal. Systolic function was normal. The estimated ejection   fraction was in the range of 60% to 65%. There is akinesis of the   mid-apicalanteroseptal myocardium. Doppler parameters are   consistent with abnormal left ventricular relaxation (grade 1   diastolic dysfunction). - Aortic valve: Trileaflet; mildly calcified leaflets. There was   trivial regurgitation. - Mitral valve: Mildly calcified annulus. There was trivial   regurgitation. - Left atrium: The atrium was at the upper limits of normal in   size. - Right atrium: Central venous pressure (est): 3 mm Hg. - Atrial septum: No defect or patent foramen ovale was identified. - Tricuspid valve: There was physiologic regurgitation. - Pulmonary arteries: Systolic pressure could not be accurately   estimated. - Pericardium, extracardiac: There was no pericardial effusion.  Left heart  catheterization 03/27/18:  Prox RCA lesion is 80% stenosed.  Mid RCA-1 lesion is 80% stenosed.  Mid RCA-2 lesion is 90% stenosed.  Dist RCA lesion is 99% stenosed.  Prox Cx lesion is 80% stenosed.  Ost 2nd Mrg lesion is 70% stenosed.  Ost LAD to Prox LAD lesion is 95% stenosed.  Prox LAD lesion is 80% stenosed.  Ost 1st Diag lesion is 90% stenosed.  Dist LAD lesion is 80% stenosed.   Severe three-vessel coronary obstructive disease with 85% in-stent restenosis in the proximal LAD stent followed by 80% LAD stenosis, 90% first diagonal stenosis and 80% apical LAD stenosis; 80% proximal left circumflex stenosis with 70% circumflex marginal stenosis; and calcified RCA with diffuse disease with stenoses of 80% proximal mid 90% before the acute margin and 99% after the acute margin with extensive collateralization from the very proximal conus proximal branches supplying the distal RCA.  LVEDP 16 mmHg.  RECOMMENDATION: Surgical consultation for CABG revascularization.  High potency statin therapy get LDL less than 70.  The patient will be started on heparin 8 hours post sheath discontinuance.  Continue aspirin.  Will avoid P2 Y 12 inhibition due to need for CABG revascularization.  Myoview 12/22/19:Study Highlights    Nuclear stress EF: 55%.  The left ventricular ejection fraction is normal (55-65%).  Blood pressure demonstrated a normal response to exercise.  There was no ST segment deviation noted during stress.  The study is normal.  This is a low risk study.   Normal resting and stress perfusion. No ischemia or infarction EF 55%    ASSESSMENT AND PLAN:   1. CAD s/p CABG: Patient presented to [facility] 03/26/18 with unstable angina, sent to [facility] for LHC which revealed severe 3-vessel CAD. He underwent CABG 92/42/68 without complications. This included LIMA to LAD, SVG to PDA, SVG to OM2 and cryopreserved SVG to OM1.  - Continue aspirin,  and statin -  Continue carvedilol -  Myoview study normal on 12/22/19.  2. HTN: BP is controlled. On low dose Coreg - Continue carvedilol 3.173m BID  3. Dyslipidemia: on high dose Zocor. [facility]. PAD:  Has bilaterally occluded SFA - felt to not be a candidate for endovascular or surgical repair on LE angiography 07/2016. - Continue aspirin and statin  5. DM type 2: [facility] Continue metformin and glipizide - sugars appear to be well controlled.  6. Marked fatigue. I am concerned that some of his medications are having too much of a sedative effect. He has an appointment with psychiatry soon and will discuss with them.  7. History of tobacco abuse- now quit.   Current medicines are reviewed at length with the patient today.  The patient does not have concerns regarding medicines.  The following changes have been made:  Will stop omeprazole and start pantoprazole; otherwise no medication changes  Labs/ tests ordered today include:   No orders of the defined types were placed in this encounter.    Disposition:   FU with Dr. JMartiniquein one year.  Signed, Marwa Fuhrman JMartinique MD  01/06/2020 10:53 AM

## 2020-01-06 ENCOUNTER — Ambulatory Visit (INDEPENDENT_AMBULATORY_CARE_PROVIDER_SITE_OTHER): Payer: Medicare HMO | Admitting: Cardiology

## 2020-01-06 ENCOUNTER — Other Ambulatory Visit: Payer: Self-pay

## 2020-01-06 ENCOUNTER — Encounter: Payer: Self-pay | Admitting: Cardiology

## 2020-01-06 VITALS — BP 140/78 | HR 70 | Temp 97.0°F | Ht 71.0 in | Wt 160.4 lb

## 2020-01-06 DIAGNOSIS — Z951 Presence of aortocoronary bypass graft: Secondary | ICD-10-CM | POA: Diagnosis not present

## 2020-01-06 DIAGNOSIS — E7800 Pure hypercholesterolemia, unspecified: Secondary | ICD-10-CM | POA: Diagnosis not present

## 2020-01-06 DIAGNOSIS — I2584 Coronary atherosclerosis due to calcified coronary lesion: Secondary | ICD-10-CM

## 2020-01-06 DIAGNOSIS — I10 Essential (primary) hypertension: Secondary | ICD-10-CM

## 2020-01-06 DIAGNOSIS — I739 Peripheral vascular disease, unspecified: Secondary | ICD-10-CM

## 2020-01-06 DIAGNOSIS — R5383 Other fatigue: Secondary | ICD-10-CM

## 2020-01-06 DIAGNOSIS — I2510 Atherosclerotic heart disease of native coronary artery without angina pectoris: Secondary | ICD-10-CM | POA: Diagnosis not present

## 2020-01-06 MED ORDER — NITROGLYCERIN 0.4 MG SL SUBL: 0.4000 mg | SUBLINGUAL_TABLET | SUBLINGUAL | 3 refills | Status: DC | PRN

## 2020-01-11 DIAGNOSIS — I739 Peripheral vascular disease, unspecified: Secondary | ICD-10-CM | POA: Diagnosis not present

## 2020-01-11 DIAGNOSIS — E1159 Type 2 diabetes mellitus with other circulatory complications: Secondary | ICD-10-CM | POA: Diagnosis not present

## 2020-01-11 DIAGNOSIS — Z7689 Persons encountering health services in other specified circumstances: Secondary | ICD-10-CM | POA: Diagnosis not present

## 2020-01-11 DIAGNOSIS — F418 Other specified anxiety disorders: Secondary | ICD-10-CM | POA: Diagnosis not present

## 2020-01-11 DIAGNOSIS — Z6822 Body mass index (BMI) 22.0-22.9, adult: Secondary | ICD-10-CM | POA: Diagnosis not present

## 2020-01-11 DIAGNOSIS — E1169 Type 2 diabetes mellitus with other specified complication: Secondary | ICD-10-CM | POA: Diagnosis not present

## 2020-01-11 DIAGNOSIS — J449 Chronic obstructive pulmonary disease, unspecified: Secondary | ICD-10-CM | POA: Diagnosis not present

## 2020-01-11 DIAGNOSIS — E782 Mixed hyperlipidemia: Secondary | ICD-10-CM | POA: Diagnosis not present

## 2020-01-11 DIAGNOSIS — K51919 Ulcerative colitis, unspecified with unspecified complications: Secondary | ICD-10-CM | POA: Diagnosis not present

## 2020-01-12 ENCOUNTER — Ambulatory Visit (HOSPITAL_COMMUNITY): Payer: Medicare HMO

## 2020-01-18 DIAGNOSIS — I25810 Atherosclerosis of coronary artery bypass graft(s) without angina pectoris: Secondary | ICD-10-CM | POA: Diagnosis not present

## 2020-01-18 DIAGNOSIS — Z1331 Encounter for screening for depression: Secondary | ICD-10-CM | POA: Diagnosis not present

## 2020-01-18 DIAGNOSIS — F418 Other specified anxiety disorders: Secondary | ICD-10-CM | POA: Diagnosis not present

## 2020-01-18 DIAGNOSIS — Z6822 Body mass index (BMI) 22.0-22.9, adult: Secondary | ICD-10-CM | POA: Diagnosis not present

## 2020-01-18 DIAGNOSIS — E1159 Type 2 diabetes mellitus with other circulatory complications: Secondary | ICD-10-CM | POA: Diagnosis not present

## 2020-01-18 DIAGNOSIS — I739 Peripheral vascular disease, unspecified: Secondary | ICD-10-CM | POA: Diagnosis not present

## 2020-01-18 DIAGNOSIS — Z1389 Encounter for screening for other disorder: Secondary | ICD-10-CM | POA: Diagnosis not present

## 2020-01-18 DIAGNOSIS — K51919 Ulcerative colitis, unspecified with unspecified complications: Secondary | ICD-10-CM | POA: Diagnosis not present

## 2020-01-20 ENCOUNTER — Ambulatory Visit (HOSPITAL_COMMUNITY)
Admission: RE | Admit: 2020-01-20 | Discharge: 2020-01-20 | Disposition: A | Discharge status: Home / Self Care | Payer: Medicare HMO | Source: Ambulatory Visit | Attending: Cardiovascular Disease | Admitting: Cardiovascular Disease

## 2020-01-20 ENCOUNTER — Telehealth: Payer: Self-pay | Admitting: Cardiology

## 2020-01-20 ENCOUNTER — Other Ambulatory Visit: Payer: Self-pay

## 2020-01-20 DIAGNOSIS — E7800 Pure hypercholesterolemia, unspecified: Secondary | ICD-10-CM | POA: Insufficient documentation

## 2020-01-20 DIAGNOSIS — I2584 Coronary atherosclerosis due to calcified coronary lesion: Secondary | ICD-10-CM | POA: Diagnosis not present

## 2020-01-20 DIAGNOSIS — I10 Essential (primary) hypertension: Secondary | ICD-10-CM

## 2020-01-20 DIAGNOSIS — I739 Peripheral vascular disease, unspecified: Secondary | ICD-10-CM | POA: Diagnosis not present

## 2020-01-20 DIAGNOSIS — I2510 Atherosclerotic heart disease of native coronary artery without angina pectoris: Secondary | ICD-10-CM

## 2020-01-20 DIAGNOSIS — R072 Precordial pain: Secondary | ICD-10-CM | POA: Diagnosis not present

## 2020-01-20 DIAGNOSIS — Z951 Presence of aortocoronary bypass graft: Secondary | ICD-10-CM | POA: Diagnosis not present

## 2020-01-20 DIAGNOSIS — E1151 Type 2 diabetes mellitus with diabetic peripheral angiopathy without gangrene: Secondary | ICD-10-CM | POA: Insufficient documentation

## 2020-01-20 NOTE — Telephone Encounter (Signed)
[place] wife that goes by "[name]" is returning Cheryl's call. Please advise.

## 2020-01-20 NOTE — Telephone Encounter (Signed)
Pts wife [name] on [place] advised pts vascular US results and verbalized understanding... copy sent to pts PCP Dr. Geni Bers.

## 2020-01-25 DIAGNOSIS — E1159 Type 2 diabetes mellitus with other circulatory complications: Secondary | ICD-10-CM | POA: Diagnosis not present

## 2020-01-25 DIAGNOSIS — E1122 Type 2 diabetes mellitus with diabetic chronic kidney disease: Secondary | ICD-10-CM | POA: Diagnosis not present

## 2020-02-01 DIAGNOSIS — J449 Chronic obstructive pulmonary disease, unspecified: Secondary | ICD-10-CM | POA: Diagnosis not present

## 2020-02-01 DIAGNOSIS — K51919 Ulcerative colitis, unspecified with unspecified complications: Secondary | ICD-10-CM | POA: Diagnosis not present

## 2020-02-01 DIAGNOSIS — Z7689 Persons encountering health services in other specified circumstances: Secondary | ICD-10-CM | POA: Diagnosis not present

## 2020-02-01 DIAGNOSIS — N39498 Other specified urinary incontinence: Secondary | ICD-10-CM | POA: Diagnosis not present

## 2020-02-01 DIAGNOSIS — Z6823 Body mass index (BMI) 23.0-23.9, adult: Secondary | ICD-10-CM | POA: Diagnosis not present

## 2020-02-01 DIAGNOSIS — I739 Peripheral vascular disease, unspecified: Secondary | ICD-10-CM | POA: Diagnosis not present

## 2020-02-01 DIAGNOSIS — E1169 Type 2 diabetes mellitus with other specified complication: Secondary | ICD-10-CM | POA: Diagnosis not present

## 2020-02-01 DIAGNOSIS — E1159 Type 2 diabetes mellitus with other circulatory complications: Secondary | ICD-10-CM | POA: Diagnosis not present

## 2020-02-15 DIAGNOSIS — I10 Essential (primary) hypertension: Secondary | ICD-10-CM | POA: Diagnosis not present

## 2020-03-16 ENCOUNTER — Ambulatory Visit: Payer: Medicare HMO | Admitting: Urology

## 2020-07-28 ENCOUNTER — Telehealth: Payer: Self-pay | Admitting: Family Medicine

## 2020-07-28 NOTE — Telephone Encounter (Signed)
Tried calling patient to  schedule Medicare Annual Wellness Visit (AWV) either virtually or in office.  No answre    AWV-I PER [place] 04/23/09  please schedule at anytime

## 2020-08-25 DIAGNOSIS — H52209 Unspecified astigmatism, unspecified eye: Secondary | ICD-10-CM | POA: Diagnosis not present

## 2020-08-25 DIAGNOSIS — H5203 Hypermetropia, bilateral: Secondary | ICD-10-CM | POA: Diagnosis not present

## 2020-08-25 DIAGNOSIS — H524 Presbyopia: Secondary | ICD-10-CM | POA: Diagnosis not present

## 2021-01-30 DIAGNOSIS — I2510 Atherosclerotic heart disease of native coronary artery without angina pectoris: Secondary | ICD-10-CM | POA: Insufficient documentation

## 2021-07-14 LAB — HM COLONOSCOPY

## 2021-10-11 ENCOUNTER — Ambulatory Visit: Payer: Medicare HMO | Admitting: Family Medicine

## 2021-10-26 ENCOUNTER — Ambulatory Visit: Payer: Medicare HMO | Admitting: Family Medicine

## 2021-11-16 ENCOUNTER — Ambulatory Visit (INDEPENDENT_AMBULATORY_CARE_PROVIDER_SITE_OTHER): Payer: Medicare HMO | Admitting: Family Medicine

## 2021-11-16 ENCOUNTER — Encounter: Payer: Self-pay | Admitting: Family Medicine

## 2021-11-16 VITALS — BP 177/73 | HR 63 | Temp 98.2°F | Wt 172.2 lb

## 2021-11-16 DIAGNOSIS — I2510 Atherosclerotic heart disease of native coronary artery without angina pectoris: Secondary | ICD-10-CM | POA: Diagnosis not present

## 2021-11-16 DIAGNOSIS — K5000 Crohn's disease of small intestine without complications: Secondary | ICD-10-CM | POA: Diagnosis not present

## 2021-11-16 DIAGNOSIS — F39 Unspecified mood [affective] disorder: Secondary | ICD-10-CM | POA: Diagnosis not present

## 2021-11-16 DIAGNOSIS — I10 Essential (primary) hypertension: Secondary | ICD-10-CM | POA: Diagnosis not present

## 2021-11-16 DIAGNOSIS — E1151 Type 2 diabetes mellitus with diabetic peripheral angiopathy without gangrene: Secondary | ICD-10-CM

## 2021-11-16 DIAGNOSIS — N1832 Chronic kidney disease, stage 3b: Secondary | ICD-10-CM

## 2021-11-16 DIAGNOSIS — Z130 Encounter for screening for diseases of the blood and blood-forming organs and certain disorders involving the immune mechanism: Secondary | ICD-10-CM | POA: Diagnosis not present

## 2021-11-16 DIAGNOSIS — E7800 Pure hypercholesterolemia, unspecified: Secondary | ICD-10-CM | POA: Diagnosis not present

## 2021-11-16 MED ORDER — DIPHENOXYLATE-ATROPINE 2.5-0.025 MG PO TABS: 1.0000 | ORAL_TABLET | Freq: Four times a day (QID) | ORAL | 0 refills | Status: DC | PRN

## 2021-11-16 NOTE — Patient Instructions (Addendum)
Labs today.  I will get records from [place].  I am going to arrange referrals to Psychiatry and GI.  Take care  Dr. Lacinda Axon

## 2021-11-17 DIAGNOSIS — Z8546 Personal history of malignant neoplasm of prostate: Secondary | ICD-10-CM | POA: Insufficient documentation

## 2021-11-17 DIAGNOSIS — N1831 Chronic kidney disease, stage 3a: Secondary | ICD-10-CM | POA: Insufficient documentation

## 2021-11-17 DIAGNOSIS — F32A Depression, unspecified: Secondary | ICD-10-CM | POA: Insufficient documentation

## 2021-11-17 DIAGNOSIS — F39 Unspecified mood [affective] disorder: Secondary | ICD-10-CM | POA: Insufficient documentation

## 2021-11-17 DIAGNOSIS — N1832 Chronic kidney disease, stage 3b: Secondary | ICD-10-CM | POA: Insufficient documentation

## 2021-11-17 LAB — CBC
Hematocrit: 35.2 % — ABNORMAL LOW (ref 37.5–51.0)
Hemoglobin: 12.1 g/dL — ABNORMAL LOW (ref 13.0–17.7)
MCH: 32.4 pg (ref 26.6–33.0)
MCHC: 34.4 g/dL (ref 31.5–35.7)
MCV: 94 fL (ref 79–97)
Platelets: 203 10*3/uL (ref 150–450)
RBC: 3.73 x10E6/uL — ABNORMAL LOW (ref 4.14–5.80)
RDW: 12.2 % (ref 11.6–15.4)
WBC: 13.1 10*3/uL — ABNORMAL HIGH (ref 3.4–10.8)

## 2021-11-17 LAB — VITAMIN B12: Vitamin B-12: 1920 pg/mL — ABNORMAL HIGH (ref 232–1245)

## 2021-11-17 LAB — CMP14+EGFR
ALT: 12 IU/L (ref 0–44)
AST: 13 IU/L (ref 0–40)
Albumin/Globulin Ratio: 1.5 (ref 1.2–2.2)
Albumin: 4.2 g/dL (ref 3.8–4.8)
Alkaline Phosphatase: 108 IU/L (ref 44–121)
BUN/Creatinine Ratio: 13 (ref 10–24)
BUN: 22 mg/dL (ref 8–27)
Bilirubin Total: 0.2 mg/dL (ref 0.0–1.2)
CO2: 19 mmol/L — ABNORMAL LOW (ref 20–29)
Calcium: 9.3 mg/dL (ref 8.6–10.2)
Chloride: 107 mmol/L — ABNORMAL HIGH (ref 96–106)
Creatinine, Ser: 1.68 mg/dL — ABNORMAL HIGH (ref 0.76–1.27)
Globulin, Total: 2.8 g/dL (ref 1.5–4.5)
Glucose: 91 mg/dL (ref 70–99)
Potassium: 4 mmol/L (ref 3.5–5.2)
Sodium: 144 mmol/L (ref 134–144)
Total Protein: 7 g/dL (ref 6.0–8.5)
eGFR: 42 mL/min/{1.73_m2} — ABNORMAL LOW (ref >59)

## 2021-11-17 LAB — LIPID PANEL
Chol/HDL Ratio: 3.5 ratio (ref 0.0–5.0)
Cholesterol, Total: 163 mg/dL (ref 100–199)
HDL: 47 mg/dL (ref >39)
LDL Chol Calc (NIH): 91 mg/dL (ref 0–99)
Triglycerides: 145 mg/dL (ref 0–149)
VLDL Cholesterol Cal: 25 mg/dL (ref 5–40)

## 2021-11-17 LAB — HEMOGLOBIN A1C
Est. average glucose Bld gHb Est-mCnc: 131 mg/dL
Hgb A1c MFr Bld: 6.2 % — ABNORMAL HIGH (ref 4.8–5.6)

## 2021-11-17 LAB — MICROALBUMIN / CREATININE URINE RATIO
Creatinine, Urine: 139.2 mg/dL
Microalb/Creat Ratio: 44 mg/g creat — ABNORMAL HIGH (ref 0–29)
Microalbumin, Urine: 61 ug/mL

## 2021-11-17 MED ORDER — AMLODIPINE BESYLATE 5 MG PO TABS: 5.0000 mg | ORAL_TABLET | Freq: Every day | ORAL | 3 refills | Status: DC

## 2021-11-17 MED ORDER — ROSUVASTATIN CALCIUM 20 MG PO TABS: 20.0000 mg | ORAL_TABLET | Freq: Every day | ORAL | 3 refills | Status: DC

## 2021-11-17 NOTE — Assessment & Plan Note (Signed)
Asymptomatic currently.  BP elevated today. May benefit from switch from Propranolol to Metoprolol. Awaiting records.

## 2021-11-17 NOTE — Assessment & Plan Note (Signed)
Continue mesalamine. Lomotil as needed. Referring to GI.

## 2021-11-17 NOTE — Assessment & Plan Note (Signed)
Awaiting records. Referring to psychiatry.

## 2021-11-17 NOTE — Assessment & Plan Note (Signed)
A1C has returned at goal. Patient's GFR is 42. Okay to continue metformin at 500 mg BID with close monitoring of renal function.

## 2021-11-17 NOTE — Assessment & Plan Note (Signed)
LDL not at goal.  Recommending stopping Simvastatin and starting Crestor.

## 2021-11-17 NOTE — Assessment & Plan Note (Signed)
Labs have returned.  Creatinine elevated.  Continue losartan. Continue propranolol for now. Adding amlodipine.

## 2021-11-17 NOTE — Progress Notes (Signed)
Subjective:  Patient ID: [name], male    DOB: [birth year]  Age: 76 y.o. MRN: [id number]  CC: Chief Complaint  Patient presents with   Establish Care    HPI:  76 year old male with an extensive PMH presents to establish care.   His care has been through the [place] predominantly. Has been seen at our office 3 years ago.  Has CAD and has had CABG. Is on Aspirin, Propranolol and Simvastatin. Needs labs. Denies chest pain.   HTN is uncontrolled. Patients BP elevated today. Unsure of compliance. Is currently on Propranolol (unsure why) and Losartan.   Crohn's disease is managed by GI at the [place]. He reports that he has had a recent colonoscopy. He is on Mesalamine.  Has trouble with diarrhea and incontinence.   Unsure of control regarding his diabetes.  Needs labs. Is currently taking Metformin 500 mg BID as well as Glipizide 5 mg daily.   Patient has ongoing issues regarding his mood and memory. Seeing psychiatry at the [place]. Unhappy. Little interest in doing things. Sleeps most of the time. Has some memory difficulty as well. Is on Risperidone, Zoloft, Remeron, and Aricept.   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/2018   Crohn's disease ([place]) 03/26/2018   PAD (peripheral artery disease) ([place]) 06/13/2016   CAD (coronary artery disease) 03/21/2013   Type 2 diabetes mellitus ([place]) 06/02/2007   Hypercholesterolemia 06/02/2007   Essential hypertension 06/02/2007    Social Hx   Social History   Socioeconomic History   Marital status: Married    Spouse name: Not on file   Number of children: Not on file   Years of education: 1   Highest education level: Not on file  Occupational History   Occupation: Dealer  Tobacco Use   Smoking status: Former    Types: Cigarettes    Quit date: 02/19/2018    Years since  quitting: 3.7   Smokeless tobacco: Never  Vaping Use   Vaping Use: Never used  Substance and Sexual Activity   Alcohol use: No   Drug use: No   Sexual activity: Not on file  Other Topics Concern   Not on file  Social History Narrative   Not on file   Social Determinants of Health   Financial Resource Strain: Not on file  Food Insecurity: Not on file  Transportation Needs: Not on file  Physical Activity: Not on file  Stress: Not on file  Social Connections: Not on file    Review of Systems Per HPI  Objective:  BP (!) 177/73   Pulse 63   Temp 98.2 F (36.8 C)   Wt 172 lb 3.2 oz (78.1 kg)   SpO2 98%   BMI 24.02 kg/m      11/16/2021    3:38 PM 01/06/2020   10:20 AM 12/22/2019    9:47 AM  BP/Weight  Systolic BP 176 160   Diastolic BP 73 78   Wt. (Lbs) 172.2 160.4 169  BMI 24.02 kg/m2 22.37 kg/m2 23.57 kg/m2    Physical Exam Vitals and nursing note reviewed.  Constitutional:      General: He is not in acute distress. HENT:     Head: Normocephalic.  Eyes:     General:        Right eye: No  discharge.        Left eye: No discharge.     Conjunctiva/sclera: Conjunctivae normal.  Cardiovascular:     Rate and Rhythm: Normal rate and regular rhythm.  Pulmonary:     Effort: Pulmonary effort is normal.     Breath sounds: Normal breath sounds. No wheezing or rales.  Abdominal:     Palpations: Abdomen is soft.     Tenderness: There is no abdominal tenderness.  Neurological:     Mental Status: He is alert.  Psychiatric:     Comments: Depressed mood. Tearful.      Lab Results  Component Value Date   WBC 13.1 (H) 11/16/2021   HGB 12.1 (L) 11/16/2021   HCT 35.2 (L) 11/16/2021   PLT 203 11/16/2021   GLUCOSE 91 11/16/2021   CHOL 163 11/16/2021   TRIG 145 11/16/2021   HDL 47 11/16/2021   LDLCALC 91 11/16/2021   ALT 12 11/16/2021   AST 13 11/16/2021   NA 144 11/16/2021   K 4.0 11/16/2021   CL 107 (H) 11/16/2021   CREATININE 1.68 (H) 11/16/2021   BUN 22  11/16/2021   CO2 19 (L) 11/16/2021   TSH 1.707 03/28/2018   INR 1.76 04/01/2018   HGBA1C 6.2 (H) 11/16/2021     Assessment & Plan:   Problem List Items Addressed This Visit       Cardiovascular and Mediastinum   CAD (coronary artery disease)    Asymptomatic currently.  BP elevated today. May benefit from switch from Propranolol to Metoprolol. Awaiting records.       Relevant Medications   losartan (COZAAR) 100 MG tablet   propranolol (INDERAL) 40 MG tablet   amLODipine (NORVASC) 5 MG tablet   rosuvastatin (CRESTOR) 20 MG tablet   Essential hypertension - Primary    Labs have returned.  Creatinine elevated.  Continue losartan. Continue propranolol for now. Adding amlodipine.       Relevant Medications   losartan (COZAAR) 100 MG tablet   propranolol (INDERAL) 40 MG tablet   amLODipine (NORVASC) 5 MG tablet   rosuvastatin (CRESTOR) 20 MG tablet   Other Relevant Orders   CMP14+EGFR (Completed)     Endocrine   Type 2 diabetes mellitus (HCC)    A1C has returned at goal. Patient's GFR is 42. Okay to continue metformin at 500 mg BID with close monitoring of renal function.      Relevant Medications   losartan (COZAAR) 100 MG tablet   glipiZIDE (GLUCOTROL) 5 MG tablet   rosuvastatin (CRESTOR) 20 MG tablet   Other Relevant Orders   Hemoglobin A1c (Completed)   Microalbumin / creatinine urine ratio (Completed)     Genitourinary   Stage 3b chronic kidney disease ([place])    Referring to Nephrology.        Other   Crohn's disease ([place])    Continue mesalamine. Lomotil as needed. Referring to GI.      Relevant Orders   Ambulatory referral to Gastroenterology   Hypercholesterolemia    LDL not at goal.  Recommending stopping Simvastatin and starting Crestor.       Relevant Medications   losartan (COZAAR) 100 MG tablet   propranolol (INDERAL) 40 MG tablet   amLODipine (NORVASC) 5 MG tablet   rosuvastatin (CRESTOR) 20 MG tablet   Other Relevant Orders   Lipid  panel (Completed)   Mood disorder ([place])    Awaiting records. Referring to psychiatry.      Relevant Orders   Vitamin B12 (Completed)  Ambulatory referral to Psychiatry   Other Visit Diagnoses     Screening for iron deficiency anemia       Relevant Orders   CBC (Completed)       Meds ordered this encounter  Medications   diphenoxylate-atropine (LOMOTIL) 2.5-0.025 MG tablet    Sig: Take 1 tablet by mouth 4 (four) times daily as needed for diarrhea or loose stools.    Dispense:  30 tablet    Refill:  0   amLODipine (NORVASC) 5 MG tablet    Sig: Take 1 tablet (5 mg total) by mouth daily.    Dispense:  90 tablet    Refill:  3   rosuvastatin (CRESTOR) 20 MG tablet    Sig: Take 1 tablet (20 mg total) by mouth daily.    Dispense:  90 tablet    Refill:  3    Follow-up:  Return in about 3 months (around 02/16/2022).  [place]

## 2021-11-17 NOTE — Assessment & Plan Note (Signed)
Referring to Nephrology.

## 2021-11-20 ENCOUNTER — Encounter (INDEPENDENT_AMBULATORY_CARE_PROVIDER_SITE_OTHER): Payer: Self-pay | Admitting: *Deleted

## 2021-11-21 NOTE — Addendum Note (Signed)
Addended by: Dairl Ponder on: 11/21/2021 04:44 PM   Modules accepted: Orders

## 2021-11-23 ENCOUNTER — Telehealth: Payer: Self-pay | Admitting: Family Medicine

## 2021-11-23 NOTE — Telephone Encounter (Signed)
Spoke with wife(DPR) and clarified medications. Wife verbalized understanding.

## 2021-11-23 NOTE — Telephone Encounter (Signed)
Nurse--Can you please call back patient wife about his medication changes from provider. He didn't which ones to stop. [phone]

## 2021-12-08 ENCOUNTER — Ambulatory Visit (INDEPENDENT_AMBULATORY_CARE_PROVIDER_SITE_OTHER): Payer: Medicare HMO | Admitting: Family Medicine

## 2021-12-08 ENCOUNTER — Telehealth: Payer: Self-pay

## 2021-12-08 VITALS — BP 160/64 | HR 54 | Temp 98.4°F | Ht 71.0 in | Wt 169.0 lb

## 2021-12-08 DIAGNOSIS — R6881 Early satiety: Secondary | ICD-10-CM | POA: Diagnosis not present

## 2021-12-08 DIAGNOSIS — R140 Abdominal distension (gaseous): Secondary | ICD-10-CM | POA: Diagnosis not present

## 2021-12-08 MED ORDER — DONEPEZIL HCL 5 MG PO TABS: 5.0000 mg | ORAL_TABLET | Freq: Every day | ORAL | Status: DC

## 2021-12-08 NOTE — Patient Instructions (Signed)
Stop the Metformin.  Discuss stopping the Donepezil with his doctor at the [facility].  I would recommend discontinuation.  Xray today.  Follow up in 1 month.

## 2021-12-08 NOTE — Telephone Encounter (Signed)
Left message for patient to return the call for additional details and recommendations.  Pt has not heard anything about his referrals , provide nephrology specialist information on return call.

## 2021-12-10 DIAGNOSIS — R6881 Early satiety: Secondary | ICD-10-CM | POA: Insufficient documentation

## 2021-12-10 DIAGNOSIS — R140 Abdominal distension (gaseous): Secondary | ICD-10-CM | POA: Insufficient documentation

## 2021-12-10 NOTE — Progress Notes (Signed)
Subjective:  Patient ID: [name], male    DOB: 1945/11/30  Age: 76 y.o. MRN: [id number]  CC: Chief Complaint  Patient presents with   Nausea    diarrhea   early satiety    HPI:  76 year old male with CAD s/p CABG, HTN, PAD, DM-2, CKD, Crohn's disease, HLD presents for evaluation of the above.  Ongoing GI issues. Awaiting appt with GI. Reports abdominal bloating, constipation, diarrhea, early satiety (feels full after just a few bites). No reports of hematochezia or melena.   Patient Active Problem List   Diagnosis Date Noted   Abdominal bloating 12/10/2021   Early satiety 12/10/2021   History of prostate cancer 11/17/2021   Mood disorder ([place]) 11/17/2021   Stage 3b chronic kidney disease ([place]) 11/17/2021   S/P CABG x 4 04/01/2018   Crohn's disease ([place]) 03/26/2018   PAD (peripheral artery disease) ([place]) 06/13/2016   CAD (coronary artery disease) 03/21/2013   Type 2 diabetes mellitus ([place]) 06/02/2007   Hypercholesterolemia 06/02/2007   Essential hypertension 06/02/2007    Social Hx   Social History   Socioeconomic History   Marital status: Married    Spouse name: Not on file   Number of children: Not on file   Years of education: 1   Highest education level: Not on file  Occupational History   Occupation: Dealer  Tobacco Use   Smoking status: Former    Types: Cigarettes    Quit date: 02/19/2018    Years since quitting: 3.8   Smokeless tobacco: Never  Vaping Use   Vaping Use: Never used  Substance and Sexual Activity   Alcohol use: No   Drug use: No   Sexual activity: Not on file  Other Topics Concern   Not on file  Social History Narrative   Not on file   Social Determinants of Health   Financial Resource Strain: Not on file  Food Insecurity: Not on file  Transportation Needs: Not on file  Physical Activity: Not on file  Stress: Not on file  Social Connections: Not on file    Review of Systems Per HPI  Objective:  BP (!)  160/64   Pulse (!) 54   Temp 98.4 F (36.9 C)   Ht 5' 11"  (1.803 m)   Wt 169 lb (76.7 kg)   SpO2 100%   BMI 23.57 kg/m      12/08/2021    9:27 AM 11/16/2021    3:38 PM 01/06/2020   10:20 AM  BP/Weight  Systolic BP 588 325 498  Diastolic BP 64 73 78  Wt. (Lbs) 169 172.2 160.4  BMI 23.57 kg/m2 24.02 kg/m2 22.37 kg/m2    Physical Exam Vitals and nursing note reviewed.  Constitutional:      General: He is not in acute distress.    Appearance: Normal appearance.  HENT:     Head: Normocephalic and atraumatic.  Eyes:     Conjunctiva/sclera: Conjunctivae normal.  Cardiovascular:     Rate and Rhythm: Regular rhythm. Bradycardia present.  Pulmonary:     Effort: Pulmonary effort is normal.     Breath sounds: Normal breath sounds. No wheezing or rales.  Abdominal:     General: There is no distension.     Palpations: Abdomen is soft.     Tenderness: There is no abdominal tenderness.  Neurological:     Mental Status: He is alert.    Lab Results  Component Value Date   WBC 13.1 (H) 11/16/2021  HGB 12.1 (L) 11/16/2021   HCT 35.2 (L) 11/16/2021   PLT 203 11/16/2021   GLUCOSE 91 11/16/2021   CHOL 163 11/16/2021   TRIG 145 11/16/2021   HDL 47 11/16/2021   LDLCALC 91 11/16/2021   ALT 12 11/16/2021   AST 13 11/16/2021   NA 144 11/16/2021   K 4.0 11/16/2021   CL 107 (H) 11/16/2021   CREATININE 1.68 (H) 11/16/2021   BUN 22 11/16/2021   CO2 19 (L) 11/16/2021   TSH 1.707 03/28/2018   INR 1.76 04/01/2018   HGBA1C 6.2 (H) 11/16/2021     Assessment & Plan:   Problem List Items Addressed This Visit       Other   Abdominal bloating   Relevant Orders   DG Abd 1 View   Early satiety - Primary    Has appt with GI. Will need EGD. Stopping Metformin (this may be contributing to his GI issues). Advised to discuss cessation of Aricept with physician at the [facility]. KUB today.        Meds ordered this encounter  Medications   donepezil (ARICEPT) 5 MG tablet    Sig: Take 1  tablet (5 mg total) by mouth at bedtime.    Dispense:  90 tablet    Follow-up:  Return in about 1 month (around 01/08/2022).  [place]

## 2021-12-10 NOTE — Assessment & Plan Note (Signed)
Has appt with GI. Will need EGD. Stopping Metformin (this may be contributing to his GI issues). Advised to discuss cessation of Aricept with physician at the [facility]. KUB today.

## 2021-12-14 ENCOUNTER — Ambulatory Visit (HOSPITAL_COMMUNITY)
Admission: RE | Admit: 2021-12-14 | Discharge: 2021-12-14 | Disposition: A | Discharge status: Home / Self Care | Payer: Medicare HMO | Source: Ambulatory Visit | Attending: Family Medicine | Admitting: Family Medicine

## 2021-12-14 DIAGNOSIS — R140 Abdominal distension (gaseous): Secondary | ICD-10-CM | POA: Diagnosis not present

## 2021-12-14 DIAGNOSIS — I700 Atherosclerosis of aorta: Secondary | ICD-10-CM | POA: Diagnosis not present

## 2022-01-08 ENCOUNTER — Ambulatory Visit: Payer: Medicare HMO | Admitting: Family Medicine

## 2022-01-08 ENCOUNTER — Encounter (HOSPITAL_COMMUNITY): Payer: Self-pay

## 2022-01-08 ENCOUNTER — Other Ambulatory Visit: Payer: Self-pay

## 2022-01-08 ENCOUNTER — Emergency Department (HOSPITAL_COMMUNITY)
Admission: EM | Admit: 2022-01-08 | Discharge: 2022-01-08 | Discharge status: Against Medical Advice | Payer: No Typology Code available for payment source | Attending: Emergency Medicine | Admitting: Emergency Medicine

## 2022-01-08 DIAGNOSIS — R441 Visual hallucinations: Secondary | ICD-10-CM | POA: Insufficient documentation

## 2022-01-08 DIAGNOSIS — Z5321 Procedure and treatment not carried out due to patient leaving prior to being seen by health care provider: Secondary | ICD-10-CM | POA: Insufficient documentation

## 2022-01-08 NOTE — ED Triage Notes (Signed)
Pt presents to ED with complaints of visual hallucinations, denies SI/HI. Pt also decreased appetite x 2 months.

## 2022-01-09 ENCOUNTER — Telehealth: Payer: Self-pay | Admitting: Family Medicine

## 2022-01-09 ENCOUNTER — Emergency Department (HOSPITAL_COMMUNITY): Payer: No Typology Code available for payment source

## 2022-01-09 ENCOUNTER — Other Ambulatory Visit: Payer: Self-pay

## 2022-01-09 ENCOUNTER — Ambulatory Visit: Payer: Medicare HMO | Admitting: Nurse Practitioner

## 2022-01-09 ENCOUNTER — Encounter (HOSPITAL_COMMUNITY): Payer: Self-pay

## 2022-01-09 ENCOUNTER — Inpatient Hospital Stay (HOSPITAL_COMMUNITY)
Admission: EM | Admit: 2022-01-09 | Discharge: 2022-01-11 | DRG: 638 | Disposition: A | Discharge status: Home Health | Payer: No Typology Code available for payment source | Attending: Family Medicine | Admitting: Family Medicine

## 2022-01-09 VITALS — BP 106/64 | Ht 71.0 in | Wt 165.0 lb

## 2022-01-09 DIAGNOSIS — R400 Somnolence: Secondary | ICD-10-CM

## 2022-01-09 DIAGNOSIS — N179 Acute kidney failure, unspecified: Secondary | ICD-10-CM | POA: Diagnosis present

## 2022-01-09 DIAGNOSIS — E785 Hyperlipidemia, unspecified: Secondary | ICD-10-CM | POA: Diagnosis present

## 2022-01-09 DIAGNOSIS — I440 Atrioventricular block, first degree: Secondary | ICD-10-CM | POA: Diagnosis present

## 2022-01-09 DIAGNOSIS — Z8546 Personal history of malignant neoplasm of prostate: Secondary | ICD-10-CM | POA: Diagnosis not present

## 2022-01-09 DIAGNOSIS — E860 Dehydration: Secondary | ICD-10-CM | POA: Diagnosis present

## 2022-01-09 DIAGNOSIS — R739 Hyperglycemia, unspecified: Secondary | ICD-10-CM | POA: Diagnosis not present

## 2022-01-09 DIAGNOSIS — Z8249 Family history of ischemic heart disease and other diseases of the circulatory system: Secondary | ICD-10-CM

## 2022-01-09 DIAGNOSIS — Z888 Allergy status to other drugs, medicaments and biological substances status: Secondary | ICD-10-CM | POA: Diagnosis not present

## 2022-01-09 DIAGNOSIS — E1122 Type 2 diabetes mellitus with diabetic chronic kidney disease: Secondary | ICD-10-CM | POA: Diagnosis present

## 2022-01-09 DIAGNOSIS — I959 Hypotension, unspecified: Secondary | ICD-10-CM | POA: Diagnosis present

## 2022-01-09 DIAGNOSIS — F32A Depression, unspecified: Secondary | ICD-10-CM | POA: Diagnosis present

## 2022-01-09 DIAGNOSIS — Z7984 Long term (current) use of oral hypoglycemic drugs: Secondary | ICD-10-CM

## 2022-01-09 DIAGNOSIS — E119 Type 2 diabetes mellitus without complications: Secondary | ICD-10-CM

## 2022-01-09 DIAGNOSIS — Z87891 Personal history of nicotine dependence: Secondary | ICD-10-CM

## 2022-01-09 DIAGNOSIS — N1832 Chronic kidney disease, stage 3b: Secondary | ICD-10-CM | POA: Diagnosis present

## 2022-01-09 DIAGNOSIS — E1165 Type 2 diabetes mellitus with hyperglycemia: Secondary | ICD-10-CM | POA: Diagnosis present

## 2022-01-09 DIAGNOSIS — H548 Legal blindness, as defined in USA: Secondary | ICD-10-CM | POA: Diagnosis present

## 2022-01-09 DIAGNOSIS — Z7982 Long term (current) use of aspirin: Secondary | ICD-10-CM

## 2022-01-09 DIAGNOSIS — K5090 Crohn's disease, unspecified, without complications: Secondary | ICD-10-CM | POA: Diagnosis present

## 2022-01-09 DIAGNOSIS — Z87442 Personal history of urinary calculi: Secondary | ICD-10-CM | POA: Diagnosis not present

## 2022-01-09 DIAGNOSIS — D631 Anemia in chronic kidney disease: Secondary | ICD-10-CM | POA: Diagnosis present

## 2022-01-09 DIAGNOSIS — R9431 Abnormal electrocardiogram [ECG] [EKG]: Secondary | ICD-10-CM | POA: Diagnosis not present

## 2022-01-09 DIAGNOSIS — Z885 Allergy status to narcotic agent status: Secondary | ICD-10-CM

## 2022-01-09 DIAGNOSIS — R0789 Other chest pain: Secondary | ICD-10-CM | POA: Diagnosis present

## 2022-01-09 DIAGNOSIS — Z955 Presence of coronary angioplasty implant and graft: Secondary | ICD-10-CM | POA: Diagnosis not present

## 2022-01-09 DIAGNOSIS — E1151 Type 2 diabetes mellitus with diabetic peripheral angiopathy without gangrene: Secondary | ICD-10-CM | POA: Diagnosis present

## 2022-01-09 DIAGNOSIS — Z800 Family history of malignant neoplasm of digestive organs: Secondary | ICD-10-CM

## 2022-01-09 DIAGNOSIS — I10 Essential (primary) hypertension: Secondary | ICD-10-CM | POA: Diagnosis present

## 2022-01-09 DIAGNOSIS — I2510 Atherosclerotic heart disease of native coronary artery without angina pectoris: Secondary | ICD-10-CM | POA: Diagnosis present

## 2022-01-09 DIAGNOSIS — Z951 Presence of aortocoronary bypass graft: Secondary | ICD-10-CM | POA: Diagnosis not present

## 2022-01-09 DIAGNOSIS — R0602 Shortness of breath: Secondary | ICD-10-CM | POA: Diagnosis not present

## 2022-01-09 DIAGNOSIS — I129 Hypertensive chronic kidney disease with stage 1 through stage 4 chronic kidney disease, or unspecified chronic kidney disease: Secondary | ICD-10-CM | POA: Diagnosis present

## 2022-01-09 DIAGNOSIS — F419 Anxiety disorder, unspecified: Secondary | ICD-10-CM | POA: Diagnosis present

## 2022-01-09 DIAGNOSIS — Z79899 Other long term (current) drug therapy: Secondary | ICD-10-CM

## 2022-01-09 LAB — BASIC METABOLIC PANEL
Anion gap: 12 (ref 5–15)
Anion gap: 7 (ref 5–15)
BUN: 47 mg/dL — ABNORMAL HIGH (ref 8–23)
BUN: 40 mg/dL — ABNORMAL HIGH (ref 8–23)
CO2: 18 mmol/L — ABNORMAL LOW (ref 22–32)
CO2: 22 mmol/L (ref 22–32)
Calcium: 9.4 mg/dL (ref 8.9–10.3)
Calcium: 9.4 mg/dL (ref 8.9–10.3)
Chloride: 101 mmol/L (ref 98–111)
Chloride: 109 mmol/L (ref 98–111)
Creatinine, Ser: 2.29 mg/dL — ABNORMAL HIGH (ref 0.61–1.24)
Creatinine, Ser: 1.82 mg/dL — ABNORMAL HIGH (ref 0.61–1.24)
GFR, Estimated: 29 mL/min — ABNORMAL LOW (ref >60)
GFR, Estimated: 38 mL/min — ABNORMAL LOW (ref >60)
Glucose, Bld: 673 mg/dL (ref 70–99)
Glucose, Bld: 128 mg/dL — ABNORMAL HIGH (ref 70–99)
Potassium: 4.4 mmol/L (ref 3.5–5.1)
Potassium: 3.4 mmol/L — ABNORMAL LOW (ref 3.5–5.1)
Sodium: 131 mmol/L — ABNORMAL LOW (ref 135–145)
Sodium: 138 mmol/L (ref 135–145)

## 2022-01-09 LAB — CBG MONITORING, ED
Glucose-Capillary: 577 mg/dL (ref 70–99)
Glucose-Capillary: 531 mg/dL (ref 70–99)
Glucose-Capillary: 505 mg/dL (ref 70–99)

## 2022-01-09 LAB — URINALYSIS, ROUTINE W REFLEX MICROSCOPIC
Bacteria, UA: NONE SEEN
Bilirubin Urine: NEGATIVE
Glucose, UA: >=500 mg/dL — AB
Hgb urine dipstick: NEGATIVE
Ketones, ur: NEGATIVE mg/dL
Leukocytes,Ua: NEGATIVE
Nitrite: NEGATIVE
Protein, ur: 30 mg/dL — AB
Specific Gravity, Urine: 1.025 (ref 1.005–1.030)
pH: 5 (ref 5.0–8.0)

## 2022-01-09 LAB — POCT URINALYSIS DIP (CLINITEK)
Glucose, UA: >=1000 mg/dL — AB
Spec Grav, UA: 1.015 (ref 1.010–1.025)
pH, UA: 6 (ref 5.0–8.0)

## 2022-01-09 LAB — CBC
HCT: 36.4 % — ABNORMAL LOW (ref 39.0–52.0)
Hemoglobin: 12.3 g/dL — ABNORMAL LOW (ref 13.0–17.0)
MCH: 31.7 pg (ref 26.0–34.0)
MCHC: 33.8 g/dL (ref 30.0–36.0)
MCV: 93.8 fL (ref 80.0–100.0)
Platelets: 213 10*3/uL (ref 150–400)
RBC: 3.88 MIL/uL — ABNORMAL LOW (ref 4.22–5.81)
RDW: 12.5 % (ref 11.5–15.5)
WBC: 12 10*3/uL — ABNORMAL HIGH (ref 4.0–10.5)
nRBC: 0 % (ref 0.0–0.2)

## 2022-01-09 LAB — BLOOD GAS, VENOUS
Acid-base deficit: 6.1 mmol/L — ABNORMAL HIGH (ref 0.0–2.0)
Bicarbonate: 20.2 mmol/L (ref 20.0–28.0)
Drawn by: 44828
O2 Saturation: 41.3 %
Patient temperature: 36.4
pCO2, Ven: 41 mmHg — ABNORMAL LOW (ref 44–60)
pH, Ven: 7.3 (ref 7.25–7.43)
pO2, Ven: <31 mmHg — CL (ref 32–45)

## 2022-01-09 LAB — GLUCOSE, CAPILLARY
Glucose-Capillary: 298 mg/dL — ABNORMAL HIGH (ref 70–99)
Glucose-Capillary: 178 mg/dL — ABNORMAL HIGH (ref 70–99)
Glucose-Capillary: 152 mg/dL — ABNORMAL HIGH (ref 70–99)
Glucose-Capillary: 132 mg/dL — ABNORMAL HIGH (ref 70–99)
Glucose-Capillary: 156 mg/dL — ABNORMAL HIGH (ref 70–99)

## 2022-01-09 LAB — BETA-HYDROXYBUTYRIC ACID: Beta-Hydroxybutyric Acid: 0.56 mmol/L — ABNORMAL HIGH (ref 0.05–0.27)

## 2022-01-09 LAB — GLUCOSE, POCT (MANUAL RESULT ENTRY)

## 2022-01-09 LAB — HEMOGLOBIN A1C
Hgb A1c MFr Bld: 11.3 % — ABNORMAL HIGH (ref 4.8–5.6)
Mean Plasma Glucose: 277.61 mg/dL

## 2022-01-09 MED ORDER — DONEPEZIL HCL 5 MG PO TABS
5.0000 mg | ORAL_TABLET | Freq: Every day | ORAL | Status: DC
  Administered 2022-01-09 – 2022-01-10 (×2): 5 mg via ORAL
  Filled 2022-01-09 (×2): qty 1

## 2022-01-09 MED ORDER — HEPARIN SODIUM (PORCINE) 5000 UNIT/ML IJ SOLN
5000.0000 [IU] | Freq: Three times a day (TID) | INTRAMUSCULAR | Status: DC
  Administered 2022-01-09 – 2022-01-11 (×5): 5000 [IU] via SUBCUTANEOUS
  Filled 2022-01-09 (×5): qty 1

## 2022-01-09 MED ORDER — POLYETHYLENE GLYCOL 3350 17 G PO PACK: 17.0000 g | PACK | Freq: Every day | ORAL | Status: DC | PRN

## 2022-01-09 MED ORDER — LACTATED RINGERS IV BOLUS
1000.0000 mL | Freq: Once | INTRAVENOUS | Status: AC
  Administered 2022-01-09: 1000 mL via INTRAVENOUS

## 2022-01-09 MED ORDER — INSULIN REGULAR(HUMAN) IN NACL 100-0.9 UT/100ML-% IV SOLN
INTRAVENOUS | Status: DC
  Administered 2022-01-09: 9.5 [IU]/h via INTRAVENOUS
  Filled 2022-01-09: qty 100

## 2022-01-09 MED ORDER — ACETAMINOPHEN 325 MG PO TABS: 650.0000 mg | ORAL_TABLET | Freq: Four times a day (QID) | ORAL | Status: DC | PRN

## 2022-01-09 MED ORDER — ASPIRIN 81 MG PO TBEC
81.0000 mg | DELAYED_RELEASE_TABLET | Freq: Every day | ORAL | Status: DC
  Administered 2022-01-10 – 2022-01-11 (×2): 81 mg via ORAL
  Filled 2022-01-09 (×2): qty 1

## 2022-01-09 MED ORDER — LACTATED RINGERS IV SOLN: INTRAVENOUS | Status: DC

## 2022-01-09 MED ORDER — DOCUSATE SODIUM 100 MG PO CAPS
100.0000 mg | ORAL_CAPSULE | Freq: Two times a day (BID) | ORAL | Status: DC
  Administered 2022-01-09 – 2022-01-10 (×2): 100 mg via ORAL
  Filled 2022-01-09 (×2): qty 1

## 2022-01-09 MED ORDER — ACETAMINOPHEN 650 MG RE SUPP: 650.0000 mg | Freq: Four times a day (QID) | RECTAL | Status: DC | PRN

## 2022-01-09 MED ORDER — ONDANSETRON HCL 4 MG PO TABS: 4.0000 mg | ORAL_TABLET | Freq: Four times a day (QID) | ORAL | Status: DC | PRN

## 2022-01-09 MED ORDER — DEXTROSE 50 % IV SOLN: 0.0000 mL | INTRAVENOUS | Status: DC | PRN

## 2022-01-09 MED ORDER — LACTATED RINGERS IV SOLN: INTRAVENOUS | Status: AC

## 2022-01-09 MED ORDER — INSULIN ASPART 100 UNIT/ML IJ SOLN
0.0000 [IU] | INTRAMUSCULAR | Status: DC
  Administered 2022-01-10: 3 [IU] via SUBCUTANEOUS
  Administered 2022-01-10: 2 [IU] via SUBCUTANEOUS
  Administered 2022-01-10: 1 [IU] via SUBCUTANEOUS
  Administered 2022-01-10: 5 [IU] via SUBCUTANEOUS
  Administered 2022-01-10: 3 [IU] via SUBCUTANEOUS
  Administered 2022-01-10 – 2022-01-11 (×2): 2 [IU] via SUBCUTANEOUS
  Administered 2022-01-11: 5 [IU] via SUBCUTANEOUS
  Administered 2022-01-11: 1 [IU] via SUBCUTANEOUS

## 2022-01-09 MED ORDER — ONDANSETRON HCL 4 MG/2ML IJ SOLN: 4.0000 mg | Freq: Four times a day (QID) | INTRAMUSCULAR | Status: DC | PRN

## 2022-01-09 MED ORDER — AMLODIPINE BESYLATE 5 MG PO TABS: 5.0000 mg | ORAL_TABLET | Freq: Every day | ORAL | Status: DC

## 2022-01-09 MED ORDER — POTASSIUM CHLORIDE CRYS ER 20 MEQ PO TBCR
40.0000 meq | EXTENDED_RELEASE_TABLET | ORAL | Status: AC
  Administered 2022-01-09 – 2022-01-10 (×2): 40 meq via ORAL
  Filled 2022-01-09 (×2): qty 2

## 2022-01-09 NOTE — Telephone Encounter (Signed)
Patient 's sugar is running 577 he not on his metformin right now. Wife states last visit he was taken off of it.Please advise

## 2022-01-09 NOTE — H&P (Signed)
History and Physical    [name] YBO:[id number] DOB: [birth year] DOA: 01/09/2022  PCP: Coral Spikes, DO   Patient coming from: Home  I have personally briefly reviewed patient's old medical records in [place]  Chief Complaint: High blood sugars  HPI: [name] is a 76 y.o. male with medical history significant for DM, CKD 3, CABG, HTN, Crohn's, CAD.  Patient presented to the ED with complaints of feeling tired over the past few days.  He went to the outpatient provider's office and blood sugar was checked and it was reading high to referred to the ED. Patient reports increased thirst, increased urination.  No chest pain, no difficulty breathing, no cough, no purulent urination.  No fevers. Patient was previously on metformin, but this was discontinued a month ago, by the outpatient provider, they are not sure why.  He is on glipizide.  ED Course: Heart rate 49-61.  Temperature 96.1.  Blood pressure systolic 27-782.  Blood glucose 673.  Serum bicarb 18, anion gap of 12.  Creatinine elevated at 2.29.  WBC 12.  Beta hydroxybutyrate 0.56.  Chest x-ray clear.  Hospitalist admit for hyperglycemia.  Review of Systems: As per HPI all other systems reviewed and negative.  Past Medical History:  Diagnosis Date   Adenocarcinoma of prostate ([place])    Anxiety    CAD (coronary artery disease)    a. s/p prior stenting of LAD in 1996 and angioplasty alone in 1999 to LAD by review of prior notes.    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    Crohn's disease ([place])    CTS (carpal tunnel syndrome)    Mild   Depression    Diabetes mellitus without complication (HCC)    Type 2   Hyperlipidemia    Hypertension    Kidney stone    Optic neuritis     Past Surgical History:  Procedure Laterality Date   COLONOSCOPY     CORONARY ARTERY BYPASS GRAFT N/A 04/01/2018   Procedure: CORONARY ARTERY BYPASS GRAFTING (CABG) TIMES FOUR: (LIMA to LAD, CRYO VEIN to OM1, SVG to OM2, and SVG to  PDA) with PARTIAL EVH/OPEN from RIGHT and LEFT GREATER SAPHENOUS VEIN and LEFT INTERNAL MAMMARY ARTERY;  Surgeon: [name], MD;  Location: [facility];  Service: Open Heart Surgery;  Laterality: N/A;   KNEE CARTILAGE SURGERY Right    LEFT HEART CATH AND CORONARY ANGIOGRAPHY N/A 03/27/2018   Procedure: LEFT HEART CATH AND CORONARY ANGIOGRAPHY;  Surgeon: [name], MD;  Location: [facility];  Service: Cardiovascular;  Laterality: N/A;   LOWER EXTREMITY ANGIOGRAPHY N/A 08/13/2016   Procedure: Lower Extremity Angiography;  Surgeon: [name], MD;  Location: [facility];  Service: Cardiovascular;  Laterality: N/A;   NASAL SINUS SURGERY     PROSTATE SURGERY     TEE WITHOUT CARDIOVERSION N/A 04/01/2018   Procedure: TRANSESOPHAGEAL ECHOCARDIOGRAM (TEE);  Surgeon: [name], [name], MD;  Location: [facility];  Service: Open Heart Surgery;  Laterality: N/A;     reports that he quit smoking about 3 years ago. His smoking use included cigarettes. He has never used smokeless tobacco. He reports that he does not drink alcohol and does not use drugs.  Allergies  Allergen Reactions   Flomax [Tamsulosin Hcl]     UNSPECIFIED REACTION to HIGH DOSE    Morphine And Related     UNSPECIFIED REACTION     Family History  Problem Relation Age of Onset  Hypertension Mother    Colon cancer Mother    Heart attack Father    Coronary artery disease Father    Hypertension Sister    Crohn's disease Brother    Prior to Admission medications   Medication Sig Start Date End Date Taking? Authorizing Provider  albuterol (PROVENTIL HFA;VENTOLIN HFA) 108 (90 Base) MCG/ACT inhaler Inhale 2 puffs into the lungs every 6 (six) hours as needed for wheezing. 06/04/17   [name], MD  amLODipine (NORVASC) 5 MG tablet Take 1 tablet (5 mg total) by mouth daily. 11/17/21   Coral Spikes, DO  ascorbic acid (VITAMIN C) 500 MG tablet Take 500 mg by mouth 2 (two) times daily.    [provider]   aspirin EC 81 MG EC tablet Take 1 tablet (81 mg total) by mouth daily. 04/10/18   Nani Skillern, PA-C  Cholecalciferol 25 MCG (1000 UT) CHEW Chew 25 Units by mouth daily.    [provider]  diphenoxylate-atropine (LOMOTIL) 2.5-0.025 MG tablet Take 1 tablet by mouth 4 (four) times daily as needed for diarrhea or loose stools. 11/16/21   Coral Spikes, DO  docusate sodium (COLACE) 100 MG capsule Take 100 mg by mouth 2 (two) times daily.    [provider]  donepezil (ARICEPT) 5 MG tablet Take 1 tablet (5 mg total) by mouth at bedtime. 12/08/21   Coral Spikes, DO  glipiZIDE (GLUCOTROL) 5 MG tablet Take 5 mg by mouth daily before breakfast.    [provider]  losartan (COZAAR) 100 MG tablet Take 100 mg by mouth daily.    [provider]  mesalamine (LIALDA) 1.2 g EC tablet Take 1.2 g by mouth 2 (two) times daily.    [provider]  mirtazapine (REMERON) 7.5 MG tablet Take 7.5 mg by mouth at bedtime.    [provider]  omeprazole (PRILOSEC) 20 MG capsule Take 20 mg by mouth daily.    [provider]  propranolol (INDERAL) 40 MG tablet Take 40 mg by mouth daily.    [provider]  risperiDONE (RISPERDAL) 0.5 MG tablet Take 0.5 mg by mouth at bedtime. Take 1/2 tablets 1-2 times daily as needed for anxiety.    [provider]  rosuvastatin (CRESTOR) 20 MG tablet Take 1 tablet (20 mg total) by mouth daily. 11/17/21   Coral Spikes, DO  sertraline (ZOLOFT) 100 MG tablet Take 100 mg by mouth daily. 1/2 tablet once a day    [provider]    Physical Exam: Vitals:   01/09/22 1400 01/09/22 1430 01/09/22 1534 01/09/22 1550  BP: 96/69 104/72 111/62 107/66  Pulse: (!) 51 (!) 49 (!) 54 (!) 58  Resp: 16 14 (!) 21 15  Temp:      TempSrc:      SpO2: 97% 98% 98% 99%  Weight:      Height:       Constitutional: Appears generally weak, calm, comfortable Vitals:   01/09/22 1400 01/09/22 1430 01/09/22 1534  01/09/22 1550  BP: 96/69 104/72 111/62 107/66  Pulse: (!) 51 (!) 49 (!) 54 (!) 58  Resp: 16 14 (!) 21 15  Temp:      TempSrc:      SpO2: 97% 98% 98% 99%  Weight:      Height:       Eyes: PERRL, lids and conjunctivae normal ENMT: Mucous membranes are moist.   Neck: normal, supple, no masses, no thyromegaly Respiratory: clear to auscultation  bilaterally, no wheezing, no crackles.  Cardiovascular: Regular rate and rhythm, no murmurs / rubs / gallops.  Abdomen: no tenderness, no masses palpated. No hepatosplenomegaly. Bowel sounds positive.  Musculoskeletal: no clubbing / cyanosis. No joint deformity upper and lower extremities.  Skin: no rashes, lesions, ulcers. No induration Neurologic: No apparent cranial nerve abnormality, 4+/5 strength in all extremities,  Psychiatric: Normal judgment and insight. Alert and oriented x 3. Normal mood.   Labs on Admission: I have personally reviewed following labs and imaging studies  CBC: Recent Labs  Lab 01/09/22 1304  WBC 12.0*  HGB 12.3*  HCT 36.4*  MCV 93.8  PLT 706   Basic Metabolic Panel: Recent Labs  Lab 01/09/22 1304  NA 131*  K 4.4  CL 101  CO2 18*  GLUCOSE 673*  BUN 47*  CREATININE 2.29*  CALCIUM 9.4   CBG: Recent Labs  Lab 01/09/22 1234  GLUCAP 577*   Urine analysis:    Component Value Date/Time   COLORURINE YELLOW 01/09/2022 [place] 01/09/2022 1233   LABSPEC 1.025 01/09/2022 1233   PHURINE 5.0 01/09/2022 1233   GLUCOSEU >=500 (A) 01/09/2022 1233   HGBUR NEGATIVE 01/09/2022 1233   BILIRUBINUR NEGATIVE 01/09/2022 1233   KETONESUR NEGATIVE 01/09/2022 1233   PROTEINUR 30 (A) 01/09/2022 1233   UROBILINOGEN 0.2 12/20/2008 1931   NITRITE NEGATIVE 01/09/2022 1233   LEUKOCYTESUR NEGATIVE 01/09/2022 1233    Radiological Exams on Admission: DG Chest 2 View  Result Date: 01/09/2022 CLINICAL DATA:  Shortness of breath EXAM: CHEST - 2 VIEW COMPARISON:  Radiograph 05/14/2018 FINDINGS: Unchanged  cardiomediastinal silhouette with prior median sternotomy and postsurgical changes of CABG. There is no focal airspace consolidation. No pleural effusion or pneumothorax. Thoracic spondylosis. No acute osseous abnormality. IMPRESSION: No evidence of acute cardiopulmonary disease. Electronically Signed   By: Maurine Simmering M.D.   On: 01/09/2022 15:20    EKG: Independently reviewed.  Sinus bradycardia with 49.  QTc 444.  Old first-degree heart block.  PR interval 240.    Assessment/Plan Principal Problem:   Hyperglycemia Active Problems:   AKI (acute kidney injury) ([place])   Type 2 diabetes mellitus (HCC)   Essential hypertension   S/P CABG x 4  Assessment and Plan: * Hyperglycemia Hyperglycemia type II diabetic.  Blood sugar 673.  Metformin discontinued about a month ago.  Currently on glipizide for diabetes.  Not in DKA, serum bicarb 18, last bicarb 2 months ago was 19 probably his baseline 2/2 CKD.  Anion gap normal at 12.  BHB 0.56.  Chest x-ray clear.  ABG shows pH of 7.3. -Insulin drip - Hgb A1c -Monitor BMET -1 L bolus LR given, add second bolus -Continue LR at 125 cc/h -Diabetes coordinator consult  AKI (acute kidney injury) ([place]) AKI on CKD stage IIIb.  Creatinine 2.29, baseline 1.3-1.6.  Likely due to dehydration from hyperglycemia in setting of ARB use. -Hydrate -Hold losartan  S/P CABG x 4 No chest pain.  EKG shows old first-degree AV block, without ST or T wave abnormalities. -Resume Crestor, propranolol, aspirin. -Hold losartan with AKI  Essential hypertension Stable.  Heart rate 49-54 on propranolol.  Med list, he is on Norvasc and propranolol. -Will await med reconciliation.   DVT prophylaxis: Heparin Code Status: Full Family Communication: Spouse at bedside Disposition Plan: 1- 2 days Consults called: None Admission status: Inpt stepdown I certify that at the point of admission it is my clinical judgment that the patient will require inpatient hospital care  spanning beyond 2 midnights from the point of admission due to high intensity of service, high risk for further deterioration and high frequency of surveillance required.    Author: Bethena Roys, MD 01/09/2022 5:50 PM  For on call review [email or web address].

## 2022-01-09 NOTE — Assessment & Plan Note (Signed)
No chest pain.  EKG shows old first-degree AV block, without ST or T wave abnormalities. -Resume Crestor, propranolol, aspirin. -Hold losartan with AKI

## 2022-01-09 NOTE — Assessment & Plan Note (Addendum)
Stable.  Heart rate 49-54 on propranolol.  Med list, he is on Norvasc and propranolol. -Will await med reconciliation.

## 2022-01-09 NOTE — ED Notes (Signed)
Date and time results received: 01/09/22 4:43 PM    Test: PO2V Critical Value: less than 31   Name of Provider Notified: Dr. Denton Brick  Orders Received? Or Actions Taken?: see orders

## 2022-01-09 NOTE — ED Triage Notes (Addendum)
Pt presents to ED with complaints of CBG reading high starting today. Wife states he has been drinking tea today and he normally doesn't. Pt c/o tiredness, denies vomiting.

## 2022-01-09 NOTE — Assessment & Plan Note (Addendum)
AKI on CKD stage IIIb.  Creatinine 2.29, baseline 1.3-1.6.  Likely due to dehydration from hyperglycemia in setting of ARB use. -Hydrate -Hold losartan

## 2022-01-09 NOTE — Assessment & Plan Note (Addendum)
Hyperglycemia type II diabetic.  Blood sugar 673.  Metformin discontinued about a month ago.  Currently on glipizide for diabetes.  Not in DKA, serum bicarb 18, last bicarb 2 months ago was 19 probably his baseline 2/2 CKD.  Anion gap normal at 12.  BHB 0.56.  Chest x-ray clear.  ABG shows pH of 7.3. -Insulin drip - Hgb A1c -Monitor BMET -1 L bolus LR given, add second bolus -Continue LR at 125 cc/h -Diabetes coordinator consult

## 2022-01-09 NOTE — ED Notes (Signed)
HR in low to mid 50s--MD made aware

## 2022-01-09 NOTE — ED Provider Notes (Signed)
[facility] EMERGENCY DEPARTMENT Provider Note   CSN: [id number] Arrival date & time: 01/09/22  1217     History  Chief Complaint  Patient presents with   Hyperglycemia    [name] is a 76 y.o. male with T2DM, HTN, HLD, PAD, CAD status post CABG x4, stage III chronic kidney disease, history of prostate cancer, mood disorder presents with hyperglycemia.   Patient presents with wife who provides additional history.  Patient reports high blood sugar this morning in the 570s on home glucose check.  No history of sugars that high.  Wife reports that he was recently taken off one of his oral blood sugar medications, she is not sure why.  Patient reports that he feels well, only intermittently short of breath, denies any lightheadedness, fever/chills, recent illnesses, chest pain, palpitations, abdominal pain, nausea vomiting diarrhea constipation, urinary symptoms, leg swelling.  He does report that he has been very thirsty lately and feels dehydrated. He does not take insulin.  Per chart review, metformin was stopped on 12/08/2021 as they thought it might be contributing to GI issues including abdominal bloating constipation early satiety.   Hyperglycemia      Home Medications Prior to Admission medications   Medication Sig Start Date End Date Taking? Authorizing Provider  albuterol (PROVENTIL HFA;VENTOLIN HFA) 108 (90 Base) MCG/ACT inhaler Inhale 2 puffs into the lungs every 6 (six) hours as needed for wheezing. 06/04/17   [name], MD  amLODipine (NORVASC) 5 MG tablet Take 1 tablet (5 mg total) by mouth daily. 11/17/21   Coral Spikes, DO  ascorbic acid (VITAMIN C) 500 MG tablet Take 500 mg by mouth 2 (two) times daily.    [provider]  aspirin EC 81 MG EC tablet Take 1 tablet (81 mg total) by mouth daily. 04/10/18   Nani Skillern, PA-C  Cholecalciferol 25 MCG (1000 UT) CHEW Chew 25 Units by mouth daily.    [provider]   diphenoxylate-atropine (LOMOTIL) 2.5-0.025 MG tablet Take 1 tablet by mouth 4 (four) times daily as needed for diarrhea or loose stools. 11/16/21   Coral Spikes, DO  docusate sodium (COLACE) 100 MG capsule Take 100 mg by mouth 2 (two) times daily.    [provider]  donepezil (ARICEPT) 5 MG tablet Take 1 tablet (5 mg total) by mouth at bedtime. 12/08/21   Coral Spikes, DO  glipiZIDE (GLUCOTROL) 5 MG tablet Take 5 mg by mouth daily before breakfast.    [provider]  losartan (COZAAR) 100 MG tablet Take 100 mg by mouth daily.    [provider]  mesalamine (LIALDA) 1.2 g EC tablet Take 1.2 g by mouth 2 (two) times daily.    [provider]  mirtazapine (REMERON) 7.5 MG tablet Take 7.5 mg by mouth at bedtime.    [provider]  omeprazole (PRILOSEC) 20 MG capsule Take 20 mg by mouth daily.    [provider]  propranolol (INDERAL) 40 MG tablet Take 40 mg by mouth daily.    [provider]  risperiDONE (RISPERDAL) 0.5 MG tablet Take 0.5 mg by mouth at bedtime. Take 1/2 tablets 1-2 times daily as needed for anxiety.    [provider]  rosuvastatin (CRESTOR) 20 MG tablet Take 1 tablet (20 mg total) by mouth daily. 11/17/21   Coral Spikes, DO  sertraline (ZOLOFT) 100 MG tablet Take 100 mg by mouth daily. 1/2 tablet once a day    [provider]      Allergies    Flomax [tamsulosin hcl] and Morphine and related    Review of Systems   Review of Systems Review of systems negative for f/c.  A 10 point review of systems was performed and is negative unless otherwise reported in HPI.  Physical Exam Updated Vital Signs BP 104/72   Pulse (!) 49   Temp (!) 96.1 F (35.6 C) (Temporal)   Resp 14   Ht 5' 11"  (1.803 m)   Wt 74.8 kg   SpO2 98%   BMI 23.01 kg/m  Physical Exam General: Normal appearing male, lying in bed.  HEENT: PERRLA, Sclera anicteric, MMM, trachea midline. Cardiology: RRR, no  murmurs/rubs/gallops. BL radial and DP pulses equal bilaterally.  Resp: Normal respiratory rate and effort. CTAB, no wheezes, rhonchi, crackles.  Abd: Soft, non-tender, non-distended. No rebound tenderness or guarding.  GU: Deferred. MSK: No peripheral edema or signs of trauma. Extremities without deformity or TTP. No cyanosis or clubbing. Skin: warm, dry. No rashes or lesions. Back: No CVA tenderness Neuro: A&Ox4, CNs II-XII grossly intact. MAEs. Sensation grossly intact.  Psych: Normal mood and affect.   ED Results / Procedures / Treatments   Labs (all labs ordered are listed, but only abnormal results are displayed) Labs Reviewed  BASIC METABOLIC PANEL - Abnormal; Notable for the following components:      Result Value   Sodium 131 (*)    CO2 18 (*)    Glucose, Bld 673 (*)    BUN 47 (*)    Creatinine, Ser 2.29 (*)    GFR, Estimated 29 (*)    All other components within normal limits  CBC - Abnormal; Notable for the following components:   WBC 12.0 (*)    RBC 3.88 (*)    Hemoglobin 12.3 (*)    HCT 36.4 (*)    All other components within normal limits  CBG MONITORING, ED - Abnormal; Notable for the following components:   Glucose-Capillary 577 (*)    All other components within normal limits  URINALYSIS, ROUTINE W REFLEX MICROSCOPIC  BETA-HYDROXYBUTYRIC ACID    EKG EKG Interpretation  Date/Time:  Tuesday January 09 2022 15:34:54 EDT Ventricular Rate:  49 PR Interval:  240 QRS Duration: 107 QT Interval:  491 QTC Calculation: 444 R Axis:   -12 Text Interpretation: Sinus bradycardia Prolonged PR interval Low voltage, precordial leads Abnormal R-wave progression, early transition Borderline T abnormalities, lateral leads Baseline wander in lead(s) V6 Confirmed by Noemi Chapel [phone]) on 01/09/2022 3:38:45 PM  Radiology DG Chest 2 View  Result Date: 01/09/2022 CLINICAL DATA:  Shortness of breath EXAM: CHEST - 2 VIEW COMPARISON:  Radiograph 05/14/2018 FINDINGS:  Unchanged cardiomediastinal silhouette with prior median sternotomy and postsurgical changes of CABG. There is no focal airspace consolidation. No pleural effusion or pneumothorax. Thoracic spondylosis. No acute osseous abnormality. IMPRESSION: No evidence of acute cardiopulmonary disease. Electronically Signed   By: Maurine Simmering M.D.   On: 01/09/2022 15:20    Procedures Procedures    Medications Ordered in ED Medications  lactated ringers bolus 1,000 mL (0 mLs Intravenous Stopped 01/09/22 1610)  lactated ringers bolus 1,000 mL (0 mLs Intravenous Stopped 01/09/22 1900)    ED Course/ Medical Decision Making/ A&P                          Medical Decision Making Amount and/or Complexity of Data Reviewed Labs: ordered. Decision-making details documented in ED Course.  Radiology: ordered.  Risk Decision regarding hospitalization.   Patient is overall hemodynamically stable though slightly bradycardic, which has been present in the past.  Patient states he feels overall well, is chronically ill but acutely nontoxic and not acutely ill-appearing.  For patient's blood glucose, consider hyperglycemia, DKA versus HHS.  Patient states he feels overall well except for periodic shortness of breath, consider possible pulmonary edema or pneumonia and will obtain chest x-ray.  Also consider possible tachypnea due to diabetic ketoacidosis.  Will obtain UA to evaluate for ketonuria.  Consider electrolyte abnormalities, renal injury from possible dehydration. Bradycardia was present on previous clinic visits, will obtain EKG.  I have personally reviewed and interpreted all labs and imaging.   Clinical Course as of 01/09/22 1547  Tue Jan 09, 2022  1500 Creatinine(!): 2.29 Less than BL Cr [HN]  1500 Glucose(!!): 673 [HN]  1500 WBC(!): 12.0 [HN]  1500 Hemoglobin(!): 12.3 [HN]  1500 Sodium(!): 131 [HN]  1500 BUN(!): 47 [HN]  1539 Consulted to hospitalist [HN]    Clinical Course User Index [HN] Audley Hose, MD   BUN 47 indicating dehydration.  Sodium 131 down from 144 on 11/16/2021.  WBC 12, hemoglobin 12.3 which is at his baseline.  Chest x-ray without any evidence of acute abnormality.  CO2 18 indicating possible metabolic acidosis.  Pending UA, lactic acid, beta hydroxybutyrate, and VBG. Patient is consulted to hospitalist for c/f early DKA/HHS and possible initiation of insulin or adjusting pts oral meds for patient's diabetes regimen. Patient admitted to hospital for further w/u and management.   Dispo: Admit         Final Clinical Impression(s) / ED Diagnoses Final diagnoses:  Hyperglycemia  Dehydration    Rx / DC Orders ED Discharge Orders     None        This note was created using dictation software, which may contain spelling or grammatical errors.    Audley Hose, MD 01/13/22 1500

## 2022-01-09 NOTE — Progress Notes (Unsigned)
   Subjective:    Patient ID: [name], male    DOB: 1945/12/20, 76 y.o.   MRN: [id number]  HPI  Patient arrives to be seen for weakness. Patient is not eating and sleeps all the time and is having mental issues per wife. Wife states the patient doesn't have the will to do anything and is barely living. Patient went to ER yesterday and waited 3 hours but left without being seen. Review of Systems     Objective:   Physical Exam        Assessment & Plan:

## 2022-01-10 ENCOUNTER — Inpatient Hospital Stay (HOSPITAL_COMMUNITY): Payer: No Typology Code available for payment source

## 2022-01-10 ENCOUNTER — Encounter: Payer: Self-pay | Admitting: Nurse Practitioner

## 2022-01-10 DIAGNOSIS — R9431 Abnormal electrocardiogram [ECG] [EKG]: Secondary | ICD-10-CM

## 2022-01-10 DIAGNOSIS — R739 Hyperglycemia, unspecified: Secondary | ICD-10-CM | POA: Diagnosis not present

## 2022-01-10 LAB — GLUCOSE, CAPILLARY
Glucose-Capillary: 144 mg/dL — ABNORMAL HIGH (ref 70–99)
Glucose-Capillary: 161 mg/dL — ABNORMAL HIGH (ref 70–99)
Glucose-Capillary: 175 mg/dL — ABNORMAL HIGH (ref 70–99)
Glucose-Capillary: 207 mg/dL — ABNORMAL HIGH (ref 70–99)
Glucose-Capillary: 223 mg/dL — ABNORMAL HIGH (ref 70–99)
Glucose-Capillary: 278 mg/dL — ABNORMAL HIGH (ref 70–99)

## 2022-01-10 LAB — BASIC METABOLIC PANEL
Anion gap: 7 (ref 5–15)
BUN: 32 mg/dL — ABNORMAL HIGH (ref 8–23)
CO2: 20 mmol/L — ABNORMAL LOW (ref 22–32)
Calcium: 9 mg/dL (ref 8.9–10.3)
Chloride: 110 mmol/L (ref 98–111)
Creatinine, Ser: 1.55 mg/dL — ABNORMAL HIGH (ref 0.61–1.24)
GFR, Estimated: 46 mL/min — ABNORMAL LOW (ref >60)
Glucose, Bld: 186 mg/dL — ABNORMAL HIGH (ref 70–99)
Potassium: 3.5 mmol/L (ref 3.5–5.1)
Sodium: 137 mmol/L (ref 135–145)

## 2022-01-10 LAB — ECHOCARDIOGRAM COMPLETE
AR max vel: 2.56 cm2
AV Area VTI: 2.69 cm2
AV Area mean vel: 2.53 cm2
AV Mean grad: 5 mmHg
AV Peak grad: 8.6 mmHg
Ao pk vel: 1.47 m/s
Area-P 1/2: 3.15 cm2
Height: 71 in
MV VTI: 2.29 cm2
P 1/2 time: 459 msec
S' Lateral: 3.3 cm
Weight: 2659.63 oz

## 2022-01-10 LAB — CBC
HCT: 31.8 % — ABNORMAL LOW (ref 39.0–52.0)
Hemoglobin: 11.1 g/dL — ABNORMAL LOW (ref 13.0–17.0)
MCH: 31.9 pg (ref 26.0–34.0)
MCHC: 34.9 g/dL (ref 30.0–36.0)
MCV: 91.4 fL (ref 80.0–100.0)
Platelets: 177 10*3/uL (ref 150–400)
RBC: 3.48 MIL/uL — ABNORMAL LOW (ref 4.22–5.81)
RDW: 12.3 % (ref 11.5–15.5)
WBC: 10.2 10*3/uL (ref 4.0–10.5)
nRBC: 0 % (ref 0.0–0.2)

## 2022-01-10 LAB — MRSA NEXT GEN BY PCR, NASAL: MRSA by PCR Next Gen: NOT DETECTED

## 2022-01-10 MED ORDER — LIVING WELL WITH DIABETES BOOK
Freq: Once | Status: AC
  Administered 2022-01-10: 1

## 2022-01-10 MED ORDER — POTASSIUM CHLORIDE CRYS ER 20 MEQ PO TBCR
40.0000 meq | EXTENDED_RELEASE_TABLET | Freq: Once | ORAL | Status: AC
  Administered 2022-01-10: 40 meq via ORAL
  Filled 2022-01-10: qty 2

## 2022-01-10 MED ORDER — ROSUVASTATIN CALCIUM 20 MG PO TABS
20.0000 mg | ORAL_TABLET | Freq: Every day | ORAL | Status: DC
  Administered 2022-01-10 – 2022-01-11 (×2): 20 mg via ORAL
  Filled 2022-01-10 (×2): qty 1

## 2022-01-10 MED ORDER — PANTOPRAZOLE SODIUM 40 MG PO TBEC
40.0000 mg | DELAYED_RELEASE_TABLET | Freq: Every day | ORAL | Status: DC
  Administered 2022-01-10 – 2022-01-11 (×2): 40 mg via ORAL
  Filled 2022-01-10 (×2): qty 1

## 2022-01-10 MED ORDER — INSULIN GLARGINE-YFGN 100 UNIT/ML ~~LOC~~ SOLN
10.0000 [IU] | Freq: Every day | SUBCUTANEOUS | Status: DC
  Administered 2022-01-10 – 2022-01-11 (×2): 10 [IU] via SUBCUTANEOUS
  Filled 2022-01-10 (×3): qty 0.1

## 2022-01-10 MED ORDER — CHLORHEXIDINE GLUCONATE CLOTH 2 % EX PADS
6.0000 | MEDICATED_PAD | Freq: Every day | CUTANEOUS | Status: DC
  Administered 2022-01-10 – 2022-01-11 (×2): 6 via TOPICAL

## 2022-01-10 MED ORDER — SODIUM CHLORIDE 0.9 % IV BOLUS
1000.0000 mL | Freq: Once | INTRAVENOUS | Status: AC
  Administered 2022-01-10: 1000 mL via INTRAVENOUS

## 2022-01-10 MED ORDER — INSULIN STARTER KIT- PEN NEEDLES (ENGLISH)
1.0000 | Freq: Once | Status: AC
  Administered 2022-01-10: 1
  Filled 2022-01-10: qty 1

## 2022-01-10 NOTE — Progress Notes (Signed)
PROGRESS NOTE   [name], is a 76 y.o. male, DOB - 1945-05-23, OFB:[id number]  Admit date - 01/09/2022   Admitting Physician Ejiroghene Arlyce Dice, MD  Outpatient Primary MD for the patient is Coral Spikes, DO  LOS - 1  Chief Complaint  Patient presents with   Hyperglycemia        Brief Narrative:  76 y.o. male with medical history significant for DM, CKD 3, CABG, HTN, Crohn's, CAD admitted on 01/09/22 with severe hyperglycemia without frank DKA    -Assessment and Plan:  1)Dehydration/Hypotension---systolic BP was in 70 to 78E today -BP improved significantly after IV fluids, however systolic blood pressure improved to 130s over 67 sitting but dropped to 104/70 standing--- Orthostatic VS for the past 24 hrs (Last 3 readings):  BP- Lying Pulse- Lying BP- Sitting Pulse- Sitting BP- Standing at 0 minutes Pulse- Standing at 0 minutes BP- Standing at 3 minutes Pulse- Standing at 3 minutes  01/10/22 1120 -- -- -- -- -- -- 110/76 79  01/10/22 1117 -- -- -- -- 104/70 86 -- --  01/10/22 1115 -- -- 136/67 75 -- -- -- --  01/10/22 1113 116/68 72 -- -- -- -- -- --   -Stop isosorbide, stop losartan, stop amlodipine, stop propranolol -Get echo -Continue IV fluids -Check a.m. cortisol levels  2)Uncontrolled Diabetes mellitus type 2 with severe hyperglycemia--- did not meet DKA criteria on admission -PTA patient was on glipizide monotherapy, metformin was recently discontinued due to GI side effects -Off IV insulin at this time -A1c 11.3 reflecting uncontrolled DM with hyperglycemia PTA -Anticipate patient may need Lantus insulin once daily along with Amaryl 2 mg twice a day with meals -Diabetic education requested -Patient is legally blind so he may have to teach his wife how to do insulin shots for him -Await education regarding diabetes and insulin management/injections  3)H/o HTN--currently with hypotension, -Stop isosorbide, stop losartan, stop amlodipine, stop  propranolol  4)AKI----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 -   creatinine on admission=2.29  ,  baseline creatinine = 1.5 to 1.6    ,  creatinine is now=1.55  , -Renal function improved with IV fluids -Losartan is on hold -- renally adjust medications, avoid nephrotoxic agents / dehydration  / hypotension  5)CAD--status post CABG, episode of chest discomfort EKG is reassuring,  --continue Crestor and aspirin -Isosorbide and beta-blocker on hold due to hypotension -Echo pending  6) chronic anemia--- suspect related to underlying CKD -Hgb currently close to baseline -No obvious bleeding concerns =-H&H may drop further due to hemodilution/IV fluids  Disposition/Need for in-Hospital Stay- patient unable to be discharged at this time due to ---dehydration, hypotension and severe hyperglycemia--- requiring IV fluids further work-up on insulin adjustment along with diabetic education  Status is: Inpatient   Disposition: The patient is from: Home              Anticipated d/c is to: Home              Anticipated d/c date is: 1 day              Patient currently is not medically stable to d/c. Barriers: Not Clinically Stable-   Code Status :  -  Code Status: Full Code   Family Communication:    Discussed with wife   DVT Prophylaxis  :   - SCDs   heparin injection 5,000 Units Start: 01/09/22 2200   Lab Results  Component Value Date   PLT 177 01/10/2022  Inpatient Medications  Scheduled Meds:  aspirin EC  81 mg Oral Daily   Chlorhexidine Gluconate Cloth  6 each Topical Daily   donepezil  5 mg Oral QHS   heparin  5,000 Units Subcutaneous Q8H   insulin aspart  0-9 Units Subcutaneous Q4H   insulin glargine-yfgn  10 Units Subcutaneous Daily   pantoprazole  40 mg Oral Daily   potassium chloride  40 mEq Oral Once   rosuvastatin  20 mg Oral Daily   Continuous Infusions:  lactated ringers 125 mL/hr at 01/10/22 1145   PRN Meds:.acetaminophen **OR** acetaminophen, dextrose,  ondansetron **OR** ondansetron (ZOFRAN) IV   Anti-infectives (From admission, onward)    None       Subjective: Teena Dunk today has no fevers, no emesis,   -No further chest pains at this time -Complains of fatigue and generalized weakness-   Objective: Vitals:   01/10/22 1117 01/10/22 1119 01/10/22 1120 01/10/22 1200  BP: 104/70  110/76 103/74  Pulse: 79  80 (!) 57  Resp: (!) 28  (!) 21 16  Temp:  97.6 F (36.4 C)    TempSrc:  Oral    SpO2: 98%  99% 97%  Weight:      Height:        Intake/Output Summary (Last 24 hours) at 01/10/2022 1219 Last data filed at 01/10/2022 1145 Gross per 24 hour  Intake 2909.38 ml  Output 1025 ml  Net 1884.38 ml   Filed Weights   01/09/22 1230 01/09/22 1723 01/10/22 0500  Weight: 74.8 kg 73.4 kg 75.4 kg    Physical Exam  Gen:- Awake Alert,  in no apparent distress  - [place].AT, No sclera icterus Ears---HOH Eyes--- legally blind Neck-Supple Neck,No JVD,.  Lungs-  CTAB , fair symmetrical air movement CV- S1, S2 normal, regular , CABG scar Abd-  +ve B.Sounds, Abd Soft, No tenderness,    Extremity/Skin:- No  edema, pedal pulses present  Psych-affect is appropriate, oriented x3 Neuro-no new focal deficits, no tremors  Data Reviewed: I have personally reviewed following labs and imaging studies  CBC: Recent Labs  Lab 01/09/22 1304 01/10/22 0252  WBC 12.0* 10.2  HGB 12.3* 11.1*  HCT 36.4* 31.8*  MCV 93.8 91.4  PLT 213 709   Basic Metabolic Panel: Recent Labs  Lab 01/09/22 1304 01/09/22 1941 01/10/22 0252  NA 131* 138 137  K 4.4 3.4* 3.5  CL 101 109 110  CO2 18* 22 20*  GLUCOSE 673* 128* 186*  BUN 47* 40* 32*  CREATININE 2.29* 1.82* 1.55*  CALCIUM 9.4 9.4 9.0   GFR: Estimated Creatinine Clearance: 43.9 mL/min (A) (by C-G formula based on SCr of 1.55 mg/dL (H)).  HbA1C: Recent Labs    01/09/22 1558  HGBA1C 11.3*   Radiology Studies: DG Chest 2 View  Result Date: 01/09/2022 CLINICAL DATA:  Shortness  of breath EXAM: CHEST - 2 VIEW COMPARISON:  Radiograph 05/14/2018 FINDINGS: Unchanged cardiomediastinal silhouette with prior median sternotomy and postsurgical changes of CABG. There is no focal airspace consolidation. No pleural effusion or pneumothorax. Thoracic spondylosis. No acute osseous abnormality. IMPRESSION: No evidence of acute cardiopulmonary disease. Electronically Signed   By: Maurine Simmering M.D.   On: 01/09/2022 15:20     Scheduled Meds:  aspirin EC  81 mg Oral Daily   Chlorhexidine Gluconate Cloth  6 each Topical Daily   donepezil  5 mg Oral QHS   heparin  5,000 Units Subcutaneous Q8H   insulin aspart  0-9 Units Subcutaneous Q4H  insulin glargine-yfgn  10 Units Subcutaneous Daily   pantoprazole  40 mg Oral Daily   potassium chloride  40 mEq Oral Once   rosuvastatin  20 mg Oral Daily   Continuous Infusions:  lactated ringers 125 mL/hr at 01/10/22 1145     LOS: 1 day    Roxan Hockey M.D on 01/10/2022 at 12:19 PM  Go to [email or web address] - for contact info  [facility] - Office  [phone]  If 7PM-7AM, please contact night-coverage [email or web address] 01/10/2022, 12:19 PM

## 2022-01-10 NOTE — Progress Notes (Signed)
*  PRELIMINARY RESULTS* Echocardiogram 2D Echocardiogram has been performed.  Elpidio Anis 01/10/2022, 3:07 PM

## 2022-01-10 NOTE — Progress Notes (Signed)
Patient is agreeable to [facility] services. [name] is accepting of patient's insurance. Referral made to [facility] with [facility]. RW ordered via [employer].  Bintou Lafata, Clydene Pugh, LCSW

## 2022-01-10 NOTE — Inpatient Diabetes Management (Addendum)
Inpatient Diabetes Program Recommendations  AACE/ADA: New Consensus Statement on Inpatient Glycemic Control   Target Ranges:  Prepandial:   less than 140 mg/dL      Peak postprandial:   less than 180 mg/dL (1-2 hours)      Critically ill patients:  140 - 180 mg/dL    Latest Reference Range & Units 01/09/22 19:22 01/09/22 19:43 01/09/22 20:44 01/09/22 21:41 01/10/22 00:27  Glucose-Capillary 70 - 99 mg/dL 178 (H) 152 (H) 132 (H) 156 (H) 175 (H)    Latest Reference Range & Units 01/09/22 12:34 01/09/22 16:00 01/09/22 16:38 01/09/22 17:57  Glucose-Capillary 70 - 99 mg/dL 577 (HH) 531 (HH) 505 (HH) 298 (H)    Latest Reference Range & Units 01/09/22 13:04  CO2 22 - 32 mmol/L 18 (L)  Glucose 70 - 99 mg/dL 673 (HH)  BUN 8 - 23 mg/dL 47 (H)  Creatinine 0.61 - 1.24 mg/dL 2.29 (H)  Calcium 8.9 - 10.3 mg/dL 9.4  Anion gap 5 - 15  12    Latest Reference Range & Units 01/09/22 14:39  Beta-Hydroxybutyric Acid 0.05 - 0.27 mmol/L 0.56 (H)    Latest Reference Range & Units 01/09/22 15:58  Hemoglobin A1C 4.8 - 5.6 % 11.3 (H)   Review of Glycemic Control  Diabetes history: DM2 Outpatient Diabetes medications: Glipizide 5 mg QAM; was on Metformin 500 mg BID (stopped by PCP on 12/08/21 due to GI issues) Current orders for Inpatient glycemic control: Novolog 0-9 units Q4H  Inpatient Diabetes Program Recommendations:    Insulin: Patient was initially ordered IV insulin which has been transitioned to SQ insulin. No basal insulin ordered at transition.  HbgA1C: A1C 11.3% on 01/09/22 indicating an average glucose of 278 mg/dl over the past 2-3 months.  Outpatient DM: If patient is discharged new to insulin, please provide Rx for Lantus SoloStar insulin pens [phone]) and pen needles [phone]).  NOTE: Patient admitted with hyperglycemia (glucose 673 mg/dl at 13:04 on 01/09/22) and AKI. IV insulin was used initially for hyperglycemia and stopped at 21:43 on 01/09/22. No basal insulin given when IV insulin  stopped. Most current glucose 186 mg/dl on labs at 2:52 am today. In reviewing chart noted patient seen PCP for early satiety on 12/08/21 and per office note, "Reports abdominal bloating, constipation, diarrhea, early satiety.  Stopping Metformin (this may be contributing to his GI issues)."  Will plan to speak with patient today.  Addendum 01/10/22@15 :00-Spoke with patient and wife at bedside regarding DM and insulin. Patient reports that he can not hear well (hard of hearing) and he can not see well (legally blind). Provided education mainly to patient's wife. She reports that patient is not checking glucose very often (1 or 2 times per week). She confirms that patient is taking Glipizide and that he stopped Metformin a few weeks ago because PCP had him stop it. She states that she has several siblings and other family members with DM but she is unsure what glucose should be or what an A1C is. Discussed A1C results (11.3% on 01/09/22) and explained that current A1C indicates an average glucose of 278 mg/dl over the past 2-3 months. Discussed glucose and A1C goals. Discussed importance of checking CBGs and maintaining good CBG control to prevent long-term and short-term complications. Explained how hyperglycemia leads to damage within blood vessels which lead to the common complications seen with uncontrolled diabetes. Stressed to the patient the importance of improving glycemic control to prevent further complications from uncontrolled diabetes. Discussed impact of nutrition, exercise,  stress, sickness, and medications on diabetes control.  Discussed carbohydrates, carbohydrate goals per day and meal, along with portion sizes. Patient's wife reports that over the past 3 weeks that patient has been drinking a lot of sweet tea but he usually drinks mainly water and he does not like sweets. He usually eats 3 times per day (boiled eggs/toast for breakfast, sandwich for lunch, and whatever he wants for supper she will  fix/cook for him).  Asked that patient eliminate sugary beverages completely.  Encouraged patient and wife to check glucose 3-4 times per day and to keep a log book of glucose readings and DM medication taken which patient will need to take to doctor appointments. Explained how the doctor can use the log book to continue to make adjustments with DM medications if needed. Patient's wife states that patient goes to the [place] at times but she just called recently to get him an appointment and they can not see him until May 2024. Therefore, she plans to work with local PCP in [place] for follow up.  Discussed basal insulin and how it works. Explained that patient will likely be discharged on insulin. Patient stated that he did not think him or his wife could give the insulin (him because he can not see and he states that wife shakes too much). Patient's wife reports that she is right handed and her left hand shakes at times. Patient is willing to allow his wife to try to give him insulin. Educated patient's wife on insulin pen use at home. Reviewed all steps of insulin pen including attachment of needle, 2-unit air shot, dialing up dose, giving injection, removing needle, disposal of sharps, storage of unused insulin, disposal of insulin etc. Patient's wife able to provide successful return demonstration. Reviewed hypoglycemia along with treatment. Encouraged patient's wife to reach out to PCP if patient has any issues at all with hypoglycemia as DM medications may need to be adjusted. Patient's wife reports that patient is not very mobile at home; he gets up from bed and goes to chair and sits in chair all day an only gets up to go to the restroom. She reports that patient has been less mobile since open heart surgery 5 years ago. She also reports that patient "hollers at her" when she sticks his finger to check glucose so she is concerned that he will holler out when she has to give him insulin injections. Patient's  wife reports that she is very aggravated with patient because he will not get up and move and he gets upset if she has to leave the  house and go anywhere. She would like to see if patient qualifies for any type of rehab or have physical therapy at home. Informed patient that I would let TOC know about request.  Patient verbalized understanding of information discussed and reports no further questions at this time related to diabetes.  Thanks, Barnie Alderman, RN, MSN, [place] Diabetes Coordinator Inpatient Diabetes Program [phone] (Team Pager from 8am to [place])

## 2022-01-11 DIAGNOSIS — E119 Type 2 diabetes mellitus without complications: Secondary | ICD-10-CM

## 2022-01-11 LAB — GLUCOSE, CAPILLARY
Glucose-Capillary: 104 mg/dL — ABNORMAL HIGH (ref 70–99)
Glucose-Capillary: 186 mg/dL — ABNORMAL HIGH (ref 70–99)
Glucose-Capillary: 267 mg/dL — ABNORMAL HIGH (ref 70–99)

## 2022-01-11 LAB — BASIC METABOLIC PANEL
Anion gap: 5 (ref 5–15)
BUN: 19 mg/dL (ref 8–23)
CO2: 22 mmol/L (ref 22–32)
Calcium: 9 mg/dL (ref 8.9–10.3)
Chloride: 110 mmol/L (ref 98–111)
Creatinine, Ser: 1.36 mg/dL — ABNORMAL HIGH (ref 0.61–1.24)
GFR, Estimated: 54 mL/min — ABNORMAL LOW (ref >60)
Glucose, Bld: 133 mg/dL — ABNORMAL HIGH (ref 70–99)
Potassium: 4 mmol/L (ref 3.5–5.1)
Sodium: 137 mmol/L (ref 135–145)

## 2022-01-11 LAB — CORTISOL-AM, BLOOD: Cortisol - AM: 7.9 ug/dL (ref 6.7–22.6)

## 2022-01-11 MED ORDER — LANTUS SOLOSTAR 100 UNIT/ML ~~LOC~~ SOPN: 10.0000 [IU] | PEN_INJECTOR | Freq: Every day | SUBCUTANEOUS | 11 refills | Status: DC

## 2022-01-11 MED ORDER — SIMVASTATIN 80 MG PO TABS: 40.0000 mg | ORAL_TABLET | Freq: Every day | ORAL | 3 refills | Status: DC

## 2022-01-11 MED ORDER — RISPERIDONE 0.5 MG PO TABS: 0.5000 mg | ORAL_TABLET | Freq: Every day | ORAL | 3 refills | Status: DC

## 2022-01-11 MED ORDER — OMEPRAZOLE 20 MG PO CPDR: 20.0000 mg | DELAYED_RELEASE_CAPSULE | Freq: Every day | ORAL | 5 refills | Status: DC

## 2022-01-11 MED ORDER — GLIMEPIRIDE 2 MG PO TABS: 2.0000 mg | ORAL_TABLET | Freq: Two times a day (BID) | ORAL | 4 refills | Status: AC

## 2022-01-11 MED ORDER — GLIMEPIRIDE 2 MG PO TABS: 2.0000 mg | ORAL_TABLET | Freq: Two times a day (BID) | ORAL | 4 refills | Status: DC

## 2022-01-11 MED ORDER — INSULIN PEN NEEDLE 32G X 4 MM MISC: 1.0000 | Freq: Every day | 5 refills | Status: DC

## 2022-01-11 MED ORDER — PROPRANOLOL HCL 40 MG PO TABS: 40.0000 mg | ORAL_TABLET | Freq: Two times a day (BID) | ORAL | 5 refills | Status: DC

## 2022-01-11 MED ORDER — MIRTAZAPINE 7.5 MG PO TABS: 7.5000 mg | ORAL_TABLET | Freq: Every day | ORAL | 5 refills | Status: DC

## 2022-01-11 MED ORDER — ASPIRIN 81 MG PO TBEC: 81.0000 mg | DELAYED_RELEASE_TABLET | Freq: Every day | ORAL | 12 refills | Status: DC

## 2022-01-11 MED ORDER — ALBUTEROL SULFATE HFA 108 (90 BASE) MCG/ACT IN AERS: 2.0000 | INHALATION_SPRAY | RESPIRATORY_TRACT | 2 refills | Status: DC | PRN

## 2022-01-11 MED ORDER — SERTRALINE HCL 50 MG PO TABS: 50.0000 mg | ORAL_TABLET | Freq: Every day | ORAL | 3 refills | Status: DC

## 2022-01-11 NOTE — Discharge Instructions (Signed)
1)Please note that there has been several changes to your medications 2) your blood pressure medications have been adjusted as follows:-----please stop amlodipine/Norvasc please stop losartan, okay to continue propanolol 3) your diabetic medications have been adjusted as follows-please stop metformin and glipizide, please take Lantus insulin 10 units at bedtime and please take Amaryl/glimepiride 2 mg twice daily with breakfast and supper  4) repeat CBC and repeat BMP blood test on Monday, 01/15/2022 advised 5) follow-up with your primary care physician within a week for recheck and reevaluation advised 6) please keep a diary of your glucose/fingerstick checks--- and take this diary with you to your primary care physician when you follow-up in a week or so 7)Avoid ibuprofen/Advil/Aleve/Motrin/Goody Powders/Naproxen/BC powders/Meloxicam/Diclofenac/Indomethacin and other Nonsteroidal anti-inflammatory medications as these will make you more likely to bleed and can cause stomach ulcers, can also cause Kidney problems.

## 2022-01-11 NOTE — Progress Notes (Signed)
01/11/22-message sent to [facility]

## 2022-01-11 NOTE — Discharge Summary (Signed)
[name], is a 76 y.o. male  DOB Sep 27, 1945  MRN [id number].  Admission date:  01/09/2022  Admitting Physician  Bethena Roys, MD  Discharge Date:  01/11/2022   Primary MD  Coral Spikes, DO  Recommendations for primary care physician for things to follow:   1)Please note that there has been several changes to your medications 2) your blood pressure medications have been adjusted as follows:-----please stop amlodipine/Norvasc please stop losartan, okay to continue propanolol 3) your diabetic medications have been adjusted as follows-please stop metformin and glipizide, please take Lantus insulin 10 units at bedtime and please take Amaryl/glimepiride 2 mg twice daily with breakfast and supper  4) repeat CBC and repeat BMP blood test on Monday, 01/15/2022 advised 5) follow-up with your primary care physician within a week for recheck and reevaluation advised 6) please keep a diary of your glucose/fingerstick checks--- and take this diary with you to your primary care physician when you follow-up in a week or so 7)Avoid ibuprofen/Advil/Aleve/Motrin/Goody Powders/Naproxen/BC powders/Meloxicam/Diclofenac/Indomethacin and other Nonsteroidal anti-inflammatory medications as these will make you more likely to bleed and can cause stomach ulcers, can also cause Kidney problems.   Admission Diagnosis  Dehydration [E86.0] Hyperglycemia [R73.9]   Discharge Diagnosis  Dehydration [E86.0] Hyperglycemia [R73.9]  ***  Principal Problem:   Hyperglycemia Active Problems:   AKI (acute kidney injury) ([place])   Type 2 diabetes mellitus ([place])   Essential hypertension   S/P CABG x 4      Past Medical History:  Diagnosis Date   Adenocarcinoma of prostate ([place])    Anxiety    CAD (coronary artery disease)    a. s/p prior stenting of LAD in 1996 and angioplasty alone in 1999 to LAD by review of prior  notes.    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    Crohn's disease ([place])    CTS (carpal tunnel syndrome)    Mild   Depression    Diabetes mellitus without complication (H[facility])    Type 2   Hyperlipidemia    Hypertension    Kidney stone    Optic neuritis     Past Surgical History:  Procedure Laterality Date   COLONOSCOPY     CORONARY ARTERY BYPASS GRAFT N/A 04/01/2018   Procedure: CORONARY ARTERY BYPASS GRAFTING (CABG) TIMES FOUR: (LIMA to LAD, CRYO VEIN to OM1, SVG to OM2, and SVG to PDA) with PARTIAL EVH/OPEN from RIGHT and LEFT GREATER SAPHENOUS VEIN and LEFT INTERNAL MAMMARY ARTERY;  Surgeon: [name], MD;  Location: [facility];  Service: Open Heart Surgery;  Laterality: N/A;   KNEE CARTILAGE SURGERY Right    LEFT HEART CATH AND CORONARY ANGIOGRAPHY N/A 03/27/2018   Procedure: LEFT HEART CATH AND CORONARY ANGIOGRAPHY;  Surgeon: [name], MD;  Location: [facility];  Service: Cardiovascular;  Laterality: N/A;   LOWER EXTREMITY ANGIOGRAPHY N/A 08/13/2016   Procedure: Lower Extremity Angiography;  Surgeon: [name], MD;  Location: [facility];  Service: Cardiovascular;  Laterality: N/A;  NASAL SINUS SURGERY     PROSTATE SURGERY     TEE WITHOUT CARDIOVERSION N/A 04/01/2018   Procedure: TRANSESOPHAGEAL ECHOCARDIOGRAM (TEE);  Surgeon: [name], [name], MD;  Location: [facility];  Service: Open Heart Surgery;  Laterality: N/A;       HPI  from the history and physical done on the day of admission:   Chief Complaint: High blood sugars   HPI: [name] is a 76 y.o. male with medical history significant for DM, CKD 3, CABG, HTN, Crohn's, CAD.  Patient presented to the ED with complaints of feeling tired over the past few days.  He went to the outpatient provider's office and blood sugar was checked and it was reading high to referred to the ED. Patient reports increased thirst, increased urination.  No chest pain, no difficulty breathing, no cough, no purulent  urination.  No fevers. Patient was previously on metformin, but this was discontinued a month ago, by the outpatient provider, they are not sure why.  He is on glipizide.   ED Course: Heart rate 49-61.  Temperature 96.1.  Blood pressure systolic 16-109.  Blood glucose 673.  Serum bicarb 18, anion gap of 12.  Creatinine elevated at 2.29.  WBC 12.  Beta hydroxybutyrate 0.56.  Chest x-ray clear.   Hospitalist admit for hyperglycemia.   Review of Systems: As per HPI all other systems reviewed and negative.     Hospital Course:    Brief Narrative:  76 y.o. male with medical history significant for DM, CKD 3, CABG, HTN, Crohn's, CAD admitted on 01/09/22 with severe hyperglycemia without frank DKA     -Assessment and Plan:   1)Dehydration/Hypotension---systolic BP was in 70 to 60A today -BP improved significantly after IV fluids, however systolic blood pressure improved to 130s over 67 sitting but dropped to 104/70 standing--- Stop isosorbide, stop losartan, stop amlodipine, stop propranolol -Get echo -Continue IV fluids -Check a.m. cortisol levels   2)Uncontrolled Diabetes mellitus type 2 with severe hyperglycemia--- did not meet DKA criteria on admission -PTA patient was on glipizide monotherapy, metformin was recently discontinued due to GI side effects -Off IV insulin at this time -A1c 11.3 reflecting uncontrolled DM with hyperglycemia PTA -Anticipate patient may need Lantus insulin once daily along with Amaryl 2 mg twice a day with meals -Diabetic education requested -Patient is legally blind so he may have to teach his wife how to do insulin shots for him -Await education regarding diabetes and insulin management/injections   3)H/o HTN--currently with hypotension, -Stop isosorbide, stop losartan, stop amlodipine, stop propranolol   4)AKI----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 -   creatinine on admission=2.29  ,  baseline creatinine = 1.5 to 1.6    ,  creatinine is now=1.55   , -Renal function improved with IV fluids -Losartan is on hold -- renally adjust medications, avoid nephrotoxic agents / dehydration  / hypotension   5)CAD--status post CABG, episode of chest discomfort EKG is reassuring,  --continue Crestor and aspirin -Isosorbide and beta-blocker on hold due to hypotension -Echo pending   6) chronic anemia--- suspect related to underlying CKD -Hgb currently close to baseline -No obvious bleeding concerns =-H&H may drop further due to hemodilution/IV fluids     Disposition: The patient is from: Home              Anticipated d/c is to: Home              Code Status :  -  Code Status: Full  Code    Family Communication:    Discussed with wife    Discharge Condition: ***  Follow UP   Follow-up Information     Coral Spikes, DO. Schedule an appointment as soon as possible for a visit on 01/15/2022.   Specialty: Family Medicine Why: Repeat CBC and BMP blood test Contact information: [place] [address] [phone]         Martinique, Peter M, MD .   Specialty: Cardiology Contact information: [address] [place] [address] [phone]                  Consults obtained - ***  Diet and Activity recommendation:  As advised  Discharge Instructions    **** Discharge Instructions     Call MD for:  difficulty breathing, headache or visual disturbances   Complete by: As directed    Call MD for:  persistant dizziness or light-headedness   Complete by: As directed    Call MD for:  persistant nausea and vomiting   Complete by: As directed    Call MD for:  temperature >100.4   Complete by: As directed    Diet - low sodium heart healthy   Complete by: As directed    Diet Carb Modified   Complete by: As directed    Discharge instructions   Complete by: As directed    1)Please note that there has been several changes to your medications 2) your blood pressure medications have been  adjusted as follows:-----please stop amlodipine/Norvasc please stop losartan, okay to continue propanolol 3) your diabetic medications have been adjusted as follows-please stop metformin and glipizide, please take Lantus insulin 10 units at bedtime and please take Amaryl/glimepiride 2 mg twice daily with breakfast and supper  4) repeat CBC and repeat BMP blood test on Monday, 01/15/2022 advised 5) follow-up with your primary care physician within a week for recheck and reevaluation advised 6) please keep a diary of your glucose/fingerstick checks--- and take this diary with you to your primary care physician when you follow-up in a week or so 7)Avoid ibuprofen/Advil/Aleve/Motrin/Goody Powders/Naproxen/BC powders/Meloxicam/Diclofenac/Indomethacin and other Nonsteroidal anti-inflammatory medications as these will make you more likely to bleed and can cause stomach ulcers, can also cause Kidney problems.   Increase activity slowly   Complete by: As directed          Discharge Medications     Allergies as of 01/11/2022       Reactions   Flomax [tamsulosin Hcl]    UNSPECIFIED REACTION to HIGH DOSE   Morphine And Related    UNSPECIFIED REACTION         Medication List     STOP taking these medications    amLODipine 5 MG tablet Commonly known as: NORVASC   docusate sodium 100 MG capsule Commonly known as: COLACE   glipiZIDE 5 MG tablet Commonly known as: GLUCOTROL   losartan 100 MG tablet Commonly known as: COZAAR   metFORMIN 500 MG tablet Commonly known as: GLUCOPHAGE   rosuvastatin 20 MG tablet Commonly known as: Crestor       TAKE these medications    albuterol 108 (90 Base) MCG/ACT inhaler Commonly known as: VENTOLIN HFA Inhale 2 puffs into the lungs every 4 (four) hours as needed for wheezing or shortness of breath (cough). What changed:  when to take this reasons to take this   ascorbic acid 500 MG tablet Commonly known as: VITAMIN C Take 1,000 mg  by mouth  daily.   aspirin EC 81 MG tablet Take 1 tablet (81 mg total) by mouth daily with breakfast. What changed: when to take this   Cholecalciferol 25 MCG (1000 UT) Chew Chew 25 Units by mouth daily.   diphenoxylate-atropine 2.5-0.025 MG tablet Commonly known as: Lomotil Take 1 tablet by mouth 4 (four) times daily as needed for diarrhea or loose stools. What changed: when to take this   donepezil 5 MG tablet Commonly known as: ARICEPT Take 1 tablet (5 mg total) by mouth at bedtime.   glimepiride 2 MG tablet Commonly known as: Amaryl Take 1 tablet (2 mg total) by mouth 2 (two) times daily with a meal.   Insulin Pen Needle 32G X 4 MM Misc 1 Needle by Does not apply route at bedtime. For insulin administration   ISOSORBIDE PO Take 5 mg by mouth daily.   Lantus SoloStar 100 UNIT/ML Solostar Pen Generic drug: insulin glargine Inject 10 Units into the skin at bedtime.   mesalamine 1.2 g EC tablet Commonly known as: LIALDA Take 1.2 g by mouth 2 (two) times daily.   mirtazapine 7.5 MG tablet Commonly known as: REMERON Take 1 tablet (7.5 mg total) by mouth at bedtime.   omeprazole 20 MG capsule Commonly known as: PRILOSEC Take 1 capsule (20 mg total) by mouth daily.   propranolol 40 MG tablet Commonly known as: INDERAL Take 1 tablet (40 mg total) by mouth 2 (two) times daily.   risperiDONE 0.5 MG tablet Commonly known as: RISPERDAL Take 1 tablet (0.5 mg total) by mouth at bedtime. What changed: additional instructions   sertraline 50 MG tablet Commonly known as: Zoloft Take 1 tablet (50 mg total) by mouth daily. What changed:  medication strength how much to take   simvastatin 80 MG tablet Commonly known as: ZOCOR Take 0.5 tablets (40 mg total) by mouth daily.               Durable Medical Equipment  (From admission, onward)           Start     Ordered   01/10/22 1455  For home use only DME 4 wheeled rolling walker with seat  Once       Comments:  Generalized weakness and deconditioning, and uncontrolled diabetes with concerns about medication compliance  Question:  Patient needs a walker to treat with the following condition  Answer:  Weakness   01/10/22 1454            Major procedures and Radiology Reports - PLEASE review detailed and final reports for all details, in brief -   ***  ECHOCARDIOGRAM COMPLETE  Result Date: 01/10/2022    ECHOCARDIOGRAM REPORT   Patient Name:   [name] Date of Exam: 01/10/2022 Medical Rec #:  [id number]             Height:       71.0 in Accession #:    [id number]            Weight:       166.2 lb Date of Birth:  [birth year]            BSA:          1.949 m Patient Age:    32 years              BP:           103/74 mmHg Patient Gender: M  HR:           66 bpm. Exam Location:  [facility] Procedure: 2D Echo, Cardiac Doppler and Color Doppler Indications:    Abnormal ECG  History:        Patient has prior history of Echocardiogram examinations, most                 recent 03/26/2018. CAD, Prior CABG, PAD; Risk                 Factors:Hypertension, Diabetes and Current Smoker.  Sonographer:    Wenda Low Referring Phys: [place]  1. Left ventricular ejection fraction, by estimation, is 60 to 65%. The left ventricle has normal function. The left ventricle has no regional wall motion abnormalities. There is mild left ventricular hypertrophy. Left ventricular diastolic parameters are consistent with Grade I diastolic dysfunction (impaired relaxation).  2. Right ventricular systolic function is normal. The right ventricular size is mildly enlarged.  3. The mitral valve is normal in structure. No evidence of mitral valve regurgitation. No evidence of mitral stenosis.  4. The aortic valve is calcified. There is moderate calcification of the aortic valve. There is mild thickening of the aortic valve. Aortic valve regurgitation is mild. Aortic valve  sclerosis/calcification is present, without any evidence of aortic stenosis. Aortic valve mean gradient measures 5.0 mmHg. Aortic valve Vmax measures 1.47 m/s.  5. The inferior vena cava is normal in size with greater than 50% respiratory variability, suggesting right atrial pressure of 3 mmHg. FINDINGS  Left Ventricle: Left ventricular ejection fraction, by estimation, is 60 to 65%. The left ventricle has normal function. The left ventricle has no regional wall motion abnormalities. The left ventricular internal cavity size was normal in size. There is  mild left ventricular hypertrophy. Left ventricular diastolic parameters are consistent with Grade I diastolic dysfunction (impaired relaxation). Right Ventricle: The right ventricular size is mildly enlarged. No increase in right ventricular wall thickness. Right ventricular systolic function is normal. Left Atrium: Left atrial size was normal in size. Right Atrium: Right atrial size was normal in size. Pericardium: There is no evidence of pericardial effusion. Mitral Valve: The mitral valve is normal in structure. No evidence of mitral valve regurgitation. No evidence of mitral valve stenosis. MV peak gradient, 5.1 mmHg. The mean mitral valve gradient is 2.0 mmHg. Tricuspid Valve: The tricuspid valve is normal in structure. Tricuspid valve regurgitation is not demonstrated. No evidence of tricuspid stenosis. Aortic Valve: The aortic valve is calcified. There is moderate calcification of the aortic valve. There is mild thickening of the aortic valve. Aortic valve regurgitation is mild. Aortic regurgitation PHT measures 459 msec. Aortic valve sclerosis/calcification is present, without any evidence of aortic stenosis. Aortic valve mean gradient measures 5.0 mmHg. Aortic valve peak gradient measures 8.6 mmHg. Aortic valve area, by VTI measures 2.69 cm. Pulmonic Valve: The pulmonic valve was normal in structure. Pulmonic valve regurgitation is not visualized. No  evidence of pulmonic stenosis. Aorta: The aortic root is normal in size and structure. Venous: The inferior vena cava is normal in size with greater than 50% respiratory variability, suggesting right atrial pressure of 3 mmHg. IAS/Shunts: No atrial level shunt detected by color flow Doppler.  LEFT VENTRICLE PLAX 2D LVIDd:         4.90 cm   Diastology LVIDs:         3.30 cm   LV e' medial:    6.31 cm/s LV PW:  0.90 cm   LV E/e' medial:  13.6 LV IVS:        1.20 cm   LV e' lateral:   11.00 cm/s LVOT diam:     2.00 cm   LV E/e' lateral: 7.8 LV SV:         96 LV SV Index:   49 LVOT Area:     3.14 cm  RIGHT VENTRICLE RV Basal diam:  4.00 cm RV Mid diam:    3.50 cm RV S prime:     6.53 cm/s LEFT ATRIUM           Index        RIGHT ATRIUM           Index LA diam:      3.70 cm 1.90 cm/m   RA Area:     12.70 cm LA Vol (A2C): 31.6 ml 16.21 ml/m  RA Volume:   23.70 ml  12.16 ml/m LA Vol (A4C): 53.9 ml 27.65 ml/m  AORTIC VALVE                     PULMONIC VALVE AV Area (Vmax):    2.56 cm      PV Vmax:       1.33 m/s AV Area (Vmean):   2.53 cm      PV Peak grad:  7.1 mmHg AV Area (VTI):     2.69 cm AV Vmax:           147.00 cm/s AV Vmean:          102.000 cm/s AV VTI:            0.358 m AV Peak Grad:      8.6 mmHg AV Mean Grad:      5.0 mmHg LVOT Vmax:         120.00 cm/s LVOT Vmean:        82.300 cm/s LVOT VTI:          0.306 m LVOT/AV VTI ratio: 0.85 AI PHT:            459 msec  AORTA Ao Root diam: 3.10 cm Ao Asc diam:  3.10 cm MITRAL VALVE MV Area (PHT): 3.15 cm     SHUNTS MV Area VTI:   2.29 cm     Systemic VTI:  0.31 m MV Peak grad:  5.1 mmHg     Systemic Diam: 2.00 cm MV Mean grad:  2.0 mmHg MV Vmax:       1.13 m/s MV Vmean:      69.8 cm/s MV Decel Time: 241 msec MV E velocity: 86.10 cm/s MV A velocity: 102.00 cm/s MV E/A ratio:  0.84 Candee Furbish MD Electronically signed by Candee Furbish MD Signature Date/Time: 01/10/2022/3:18:11 PM    Final    DG Chest 2 View  Result Date: 01/09/2022 CLINICAL DATA:   Shortness of breath EXAM: CHEST - 2 VIEW COMPARISON:  Radiograph 05/14/2018 FINDINGS: Unchanged cardiomediastinal silhouette with prior median sternotomy and postsurgical changes of CABG. There is no focal airspace consolidation. No pleural effusion or pneumothorax. Thoracic spondylosis. No acute osseous abnormality. IMPRESSION: No evidence of acute cardiopulmonary disease. Electronically Signed   By: Maurine Simmering M.D.   On: 01/09/2022 15:20   DG Abd 1 View  Result Date: 12/14/2021 CLINICAL DATA:  Bloating EXAM: ABDOMEN - 1 VIEW COMPARISON:  None Available. FINDINGS: Bowel gas pattern is unremarkable. No significant stool burden. Prostate radiation seeds. Atherosclerotic calcification. IMPRESSION: Negative. Electronically  Signed   By: Macy Mis M.D.   On: 12/14/2021 11:45    Micro Results   *** Recent Results (from the past 240 hour(s))  MRSA Next Gen by PCR, Nasal     Status: None   Collection Time: 01/10/22 11:26 AM   Specimen: Nasal Mucosa; Nasal Swab  Result Value Ref Range Status   MRSA by PCR Next Gen NOT DETECTED NOT DETECTED Final    Comment: (NOTE) The GeneXpert MRSA Assay (FDA approved for NASAL specimens only), is one component of a comprehensive MRSA colonization surveillance program. It is not intended to diagnose MRSA infection nor to guide or monitor treatment for MRSA infections. Test performance is not FDA approved in patients less than 17 years old. Performed at [facility], [address], [place], [place] [address]     Today   Subjective    [name] today has no ***          Patient has been seen and examined prior to discharge   Objective   Blood pressure 118/76, pulse 93, temperature 98.2 F (36.8 C), temperature source Oral, resp. rate (!) 25, height 5' 11"  (1.803 m), weight 75.4 kg, SpO2 98 %.   Intake/Output Summary (Last 24 hours) at 01/11/2022 1350 Last data filed at 01/11/2022 0945 Gross per 24 hour  Intake --  Output 1550 ml  Net  -1550 ml    Exam Gen:- Awake Alert, no acute distress *** HEENT:- .AT, No sclera icterus Neck-Supple Neck,No JVD,.  Lungs-  CTAB , good air movement bilaterally CV- S1, S2 normal, regular Abd-  +ve B.Sounds, Abd Soft, No tenderness,    Extremity/Skin:- No  edema,   good pulses Psych-affect is appropriate, oriented x3 Neuro-no new focal deficits, no tremors ***   Data Review   CBC w Diff:  Lab Results  Component Value Date   WBC 10.2 01/10/2022   HGB 11.1 (L) 01/10/2022   HGB 12.1 (L) 11/16/2021   HCT 31.8 (L) 01/10/2022   HCT 35.2 (L) 11/16/2021   PLT 177 01/10/2022   PLT 203 11/16/2021   LYMPHOPCT 19 08/08/2016   MONOPCT 7 08/08/2016   EOSPCT 1 08/08/2016   BASOPCT 0 08/08/2016    CMP:  Lab Results  Component Value Date   NA 137 01/11/2022   NA 144 11/16/2021   K 4.0 01/11/2022   CL 110 01/11/2022   CO2 22 01/11/2022   BUN 19 01/11/2022   BUN 22 11/16/2021   CREATININE 1.36 (H) 01/11/2022   CREATININE 1.21 (H) 08/08/2016   PROT 7.0 11/16/2021   ALBUMIN 4.2 11/16/2021   BILITOT 0.2 11/16/2021   ALKPHOS 108 11/16/2021   AST 13 11/16/2021   ALT 12 11/16/2021  .  Total Discharge time is about 33 minutes  Roxan Hockey M.D on 01/11/2022 at 1:50 PM  Go to [email or web address] -  for contact info  [facility] - Office  [phone]

## 2022-01-12 ENCOUNTER — Telehealth: Payer: Self-pay | Admitting: *Deleted

## 2022-01-12 LAB — GLUCOSE, CAPILLARY: Glucose-Capillary: 139 mg/dL — ABNORMAL HIGH (ref 70–99)

## 2022-01-12 NOTE — Patient Outreach (Signed)
  Care Coordination [facility] Note Transition Care Management Unsuccessful Follow-up Telephone Call  Date of discharge and from where:  01/11/22 from [facility]  Attempts:  1st Attempt  Reason for unsuccessful TCM follow-up call:  Left voice message  Chong Sicilian, BSN, RN-BC RN Care Coordinator [place]: [phone] Main #: [phone]

## 2022-01-15 ENCOUNTER — Telehealth: Payer: Self-pay | Admitting: *Deleted

## 2022-01-15 NOTE — Patient Outreach (Signed)
  Care Coordination [facility] Note Transition Care Management Unsuccessful Follow-up Telephone Call  Date of discharge and from where:  01/11/22 from [facility]  Attempts:  3rd Attempt  Reason for unsuccessful TCM follow-up call:  No answer/busy. Hung up after answering. Did not answer second call.   Has PCP f/u scheduled for 01/19/22 at 11:00  Chong Sicilian, BSN, RN-BC [place]: [phone] Main #: [phone]

## 2022-01-16 ENCOUNTER — Telehealth: Payer: Self-pay | Admitting: *Deleted

## 2022-01-16 ENCOUNTER — Encounter: Payer: Self-pay | Admitting: *Deleted

## 2022-01-16 NOTE — Patient Outreach (Signed)
  Care Coordination [facility] Note Transition Care Management Follow-up Telephone Call Date of discharge and from where: 01/11/22 from [facility] How have you been since you were released from the hospital? Per wife, "He's doing ok. His blood sugar has been running a little high though" Any questions or concerns? Yes. Patient has a PCP at the [facility] in [place] as well as Dr Lacinda Axon at [place]. They would like to drop the [facility] [place] PCP but think it may affect his ability to get his medications through them for free. They would at least like to decrease the frequency of visits from every 3 months to every 6-12 months. Collaborated with Nat Christen, LCSW and Joellyn Quails, RN. Discussed possibility of switching from [place] to [facility], which is closer. Will add to complex case discussion sheet for follow-up.  Items Reviewed: Did the pt receive and understand the discharge instructions provided? Yes  Medications obtained and verified? Yes . Extensively reviewed all medications and reconciled home meds with discharge meds. Patient is holding Metformin, losartan, and amlodipine as directed. He is taking 25 units of lantus at bedtime instead of the 10 units on med list. Isosorbide is on med list but he does not have this at home. It's listed as a historical medication without any Rx hx. PCP notified of discrepancies by staff message. Other? Yes . Home blood pressure and blood sugar readings. Encouraged to continue checking both BID and PRN until he sees PCP on 9/29. Advised to take log to appt for review. Home blood sugar readings have ranged from 100-300. He is watching is sugar and carbohydrate intake. Blood pressure is WNL. Advised to continue to avoid NSAIDs. Any new allergies since your discharge? No  Dietary orders reviewed? Yes Do you have support at home? Yes   Home Care and Equipment/Supplies: Were home health services ordered? yes If so, what is the name of the agency? [facility]  Has the agency set  up a time to come to the patient's home? yes Were any new equipment or medical supplies ordered?  No What is the name of the medical supply agency? N/a Were you able to get the supplies/equipment? not applicable Do you have any questions related to the use of the equipment or supplies? No  Functional Questionnaire: (I = Independent and D = Dependent) ADLs: I  Bathing/Dressing- I  Meal Prep- I  Eating- I  Maintaining continence- I  Transferring/Ambulation- I  Managing Meds- D  Follow up appointments reviewed:  PCP Hospital f/u appt confirmed? Yes  Scheduled to see Dr Lacinda Axon on 01/19/22 @ 11:00. [facility] f/u appt confirmed? No . Needs cardiology appointment. Staff message sent to [place] [facility] to coordinate appointment. Are transportation arrangements needed? No  If their condition worsens, is the pt aware to call PCP or go to the Emergency Dept.? Yes Was the patient provided with contact information for the PCP's office or ED? Yes Was to pt encouraged to call back with questions or concerns? Yes  SDOH assessments and interventions completed:   Yes  Care Coordination Interventions Activated:  Yes   Care Coordination Interventions:  Referred for Care Coordination Services:  RN Care Coordinator Cardiology appointment requested    Encounter Outcome:  Pt. Visit Completed    Chong Sicilian, BSN, RN-BC RN Care Coordinator [facility] Direct Dial: [phone] Main #: [phone]

## 2022-01-17 ENCOUNTER — Telehealth: Payer: Self-pay | Admitting: Family Medicine

## 2022-01-17 NOTE — Telephone Encounter (Signed)
Verbal order given to Tonya at home health.

## 2022-01-17 NOTE — Telephone Encounter (Signed)
Nursing 1 week for diabetes teaching.    Tonya from [facility] states she has 2 voicemail stating this has been approved but the nurse isn't given her name and or title on the voicemail.    Deatra Canter [phone]

## 2022-01-18 ENCOUNTER — Inpatient Hospital Stay: Payer: Medicare HMO | Admitting: Family Medicine

## 2022-01-19 ENCOUNTER — Inpatient Hospital Stay: Payer: Medicare HMO | Admitting: Family Medicine

## 2022-01-22 ENCOUNTER — Ambulatory Visit (INDEPENDENT_AMBULATORY_CARE_PROVIDER_SITE_OTHER): Payer: Medicare HMO | Admitting: Family Medicine

## 2022-01-22 ENCOUNTER — Encounter: Payer: Self-pay | Admitting: Family Medicine

## 2022-01-22 VITALS — BP 118/66 | HR 50 | Temp 97.8°F | Wt 168.2 lb

## 2022-01-22 DIAGNOSIS — Z23 Encounter for immunization: Secondary | ICD-10-CM | POA: Diagnosis not present

## 2022-01-22 DIAGNOSIS — N1832 Chronic kidney disease, stage 3b: Secondary | ICD-10-CM

## 2022-01-22 DIAGNOSIS — E1151 Type 2 diabetes mellitus with diabetic peripheral angiopathy without gangrene: Secondary | ICD-10-CM

## 2022-01-22 MED ORDER — ROSUVASTATIN CALCIUM 20 MG PO TABS: 20.0000 mg | ORAL_TABLET | Freq: Every day | ORAL | 3 refills | Status: DC

## 2022-01-22 NOTE — Assessment & Plan Note (Signed)
Patient was admitted for hyperglycemia and dehydration as well as hypotension.  Improved during admission.  Hospital course was reviewed today.  Patient's blood sugars are improving.  He is doing well on Lantus.  I am not sure how the titration was done but he was discharged on 10 units and is now on 25 units of Lantus.  Advised to continue.  Laboratory studies today to reassess kidney function.  I am also ordering a C-peptide given the abrupt increase in his A1c following discontinuation of metformin.

## 2022-01-22 NOTE — Progress Notes (Signed)
01/22/22-Referrals have been followed up and pt wife has been advised of numbers to call to get scheduled.

## 2022-01-22 NOTE — Patient Instructions (Addendum)
He has an appointment with Dr. Rosina Lowenstein office at 915 on 10/10.   [address], [place], [place] [address]  His medication list is correct.   Call [phone]  to schedule an appt with Psychiatry.  Labs today.  Follow up in 1 month.

## 2022-01-22 NOTE — Progress Notes (Signed)
Subjective:  Patient ID: [name], male    DOB: [birth year]  Age: 76 y.o. MRN: [id number]  CC: Chief Complaint  Patient presents with   Follow-up    Pt arrives for hospital follow up. Pt wife is needing a sheet of what pt can/can not eat(meals and snacks)    HPI:  76 year old male with coronary artery disease, hypertension, type 2 diabetes, chronic kidney disease, Crohn's disease, hyperlipidemia, mood disorder presents for hospital follow-up.  Hospital course including discharge summary reviewed thoroughly.  In summary: Patient presented with feelings of fatigue and somnolence.  He was subsequently referred to the ER.  Patient found to have severe hyperglycemia.  His most recent A1c had been 6.2.  I previously stopped metformin due to GI issues and the fact that his blood sugars were well controlled.  During his hospital course, his A1c was 11.3.  He was treated with IV fluids and insulin.  His medications were changed at the time of discharge.  He was sent home on Lantus as well as Amaryl.  Patient found to be hypotensive.  Treated with IV fluids.  Blood pressures improved and his medications were changed at the time of discharge.  Patient also had acute kidney injury which also improved with IV fluids.  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/2018   Crohn's disease ([place]) 03/26/2018   PAD (peripheral artery disease) ([place]) 06/13/2016   CAD (coronary artery disease) 03/21/2013   Type 2 diabetes mellitus ([place]) 06/02/2007   Hypercholesterolemia 06/02/2007   Essential hypertension 06/02/2007    Social Hx   Social History   Socioeconomic History   Marital status: Married    Spouse name: Not on file   Number of children: Not on file   Years of education: 1    Highest education level: Not on file  Occupational History   Occupation: Dealer  Tobacco Use   Smoking status: Former    Types: Cigarettes    Quit date: 02/19/2018    Years since quitting: 3.9   Smokeless tobacco: Never  Vaping Use   Vaping Use: Never used  Substance and Sexual Activity   Alcohol use: No   Drug use: No   Sexual activity: Not on file  Other Topics Concern   Not on file  Social History Narrative   Not on file   Social Determinants of Health   Financial Resource Strain: Low Risk  (01/16/2022)   Overall Financial Resource Strain (CARDIA)    Difficulty of Paying Living Expenses: Not very hard  Food Insecurity: No Food Insecurity (01/09/2022)   Hunger Vital Sign    Worried About Running Out of Food in the Last Year: Never true    Ran Out of Food in the Last Year: Never true  Transportation Needs: No Transportation Needs (01/16/2022)   PRAPARE - Hydrologist (Medical): No    Lack of Transportation (Non-Medical): No  Physical Activity: Not on file  Stress: Not on file  Social Connections: Not on file    Review of Systems Per HPI  Objective:  BP 118/66   Pulse (!) 50   Temp 97.8 F (36.6 C)   Wt 168 lb 3.2 oz (76.3 kg)   SpO2 98%  BMI 23.46 kg/m      01/22/2022   11:09 AM 01/11/2022    2:00 PM 01/11/2022   12:00 PM  BP/Weight  Systolic BP 161 096 045  Diastolic BP 66 59 82  Wt. (Lbs) 168.2    BMI 23.46 kg/m2      Physical Exam Vitals and nursing note reviewed.  Constitutional:      General: He is not in acute distress.    Appearance: Normal appearance.  HENT:     Head: Normocephalic and atraumatic.  Eyes:     General:        Right eye: No discharge.        Left eye: No discharge.     Conjunctiva/sclera: Conjunctivae normal.  Cardiovascular:     Rate and Rhythm: Normal rate.  Pulmonary:     Effort: Pulmonary effort is normal.     Breath sounds: Normal breath sounds. No wheezing, rhonchi or rales.   Abdominal:     General: There is no distension.     Palpations: Abdomen is soft.     Tenderness: There is no abdominal tenderness.  Neurological:     Mental Status: He is alert.     Lab Results  Component Value Date   WBC 10.2 01/10/2022   HGB 11.1 (L) 01/10/2022   HCT 31.8 (L) 01/10/2022   PLT 177 01/10/2022   GLUCOSE 133 (H) 01/11/2022   CHOL 163 11/16/2021   TRIG 145 11/16/2021   HDL 47 11/16/2021   LDLCALC 91 11/16/2021   ALT 12 11/16/2021   AST 13 11/16/2021   NA 137 01/11/2022   K 4.0 01/11/2022   CL 110 01/11/2022   CREATININE 1.36 (H) 01/11/2022   BUN 19 01/11/2022   CO2 22 01/11/2022   TSH 1.707 03/28/2018   INR 1.76 04/01/2018   HGBA1C 11.3 (H) 01/09/2022     Assessment & Plan:   Problem List Items Addressed This Visit       Endocrine   Type 2 diabetes mellitus ([place]) - Primary    Patient was admitted for hyperglycemia and dehydration as well as hypotension.  Improved during admission.  Hospital course was reviewed today.  Patient's blood sugars are improving.  He is doing well on Lantus.  I am not sure how the titration was done but he was discharged on 10 units and is now on 25 units of Lantus.  Advised to continue.  Laboratory studies today to reassess kidney function.  I am also ordering a C-peptide given the abrupt increase in his A1c following discontinuation of metformin.      Relevant Medications   rosuvastatin (CRESTOR) 20 MG tablet   Other Relevant Orders   C-peptide     Genitourinary   Stage 3b chronic kidney disease ([place])   Relevant Orders   CBC   CMP14+EGFR   Other Visit Diagnoses     Need for vaccination       Relevant Orders   Flu Vaccine QUAD High Dose(Fluad) (Completed)       Meds ordered this encounter  Medications   rosuvastatin (CRESTOR) 20 MG tablet    Sig: Take 1 tablet (20 mg total) by mouth daily.    Dispense:  90 tablet    Refill:  3    Follow-up:  Return in about 1 month (around 02/22/2022).  [place]

## 2022-01-23 ENCOUNTER — Telehealth (HOSPITAL_COMMUNITY): Payer: Self-pay

## 2022-01-23 LAB — CMP14+EGFR
ALT: 25 IU/L (ref 0–44)
AST: 24 IU/L (ref 0–40)
Albumin/Globulin Ratio: 1.6 (ref 1.2–2.2)
Albumin: 4.2 g/dL (ref 3.8–4.8)
Alkaline Phosphatase: 110 IU/L (ref 44–121)
BUN/Creatinine Ratio: 18 (ref 10–24)
BUN: 32 mg/dL — ABNORMAL HIGH (ref 8–27)
Bilirubin Total: 0.3 mg/dL (ref 0.0–1.2)
CO2: 20 mmol/L (ref 20–29)
Calcium: 9.2 mg/dL (ref 8.6–10.2)
Chloride: 106 mmol/L (ref 96–106)
Creatinine, Ser: 1.73 mg/dL — ABNORMAL HIGH (ref 0.76–1.27)
Globulin, Total: 2.6 g/dL (ref 1.5–4.5)
Glucose: 86 mg/dL (ref 70–99)
Potassium: 4.1 mmol/L (ref 3.5–5.2)
Sodium: 141 mmol/L (ref 134–144)
Total Protein: 6.8 g/dL (ref 6.0–8.5)
eGFR: 41 mL/min/{1.73_m2} — ABNORMAL LOW (ref >59)

## 2022-01-23 LAB — CBC
Hematocrit: 35 % — ABNORMAL LOW (ref 37.5–51.0)
Hemoglobin: 11.4 g/dL — ABNORMAL LOW (ref 13.0–17.7)
MCH: 31.9 pg (ref 26.6–33.0)
MCHC: 32.6 g/dL (ref 31.5–35.7)
MCV: 98 fL — ABNORMAL HIGH (ref 79–97)
Platelets: 228 10*3/uL (ref 150–450)
RBC: 3.57 x10E6/uL — ABNORMAL LOW (ref 4.14–5.80)
RDW: 12 % (ref 11.6–15.4)
WBC: 9.6 10*3/uL (ref 3.4–10.8)

## 2022-01-23 LAB — C-PEPTIDE: C-Peptide: 5 ng/mL — ABNORMAL HIGH (ref 1.1–4.4)

## 2022-01-23 NOTE — Telephone Encounter (Signed)
Pt's wife called in to schedule advised of virtual appts she declined stating that pt can not do virtual appts will try other options

## 2022-01-24 ENCOUNTER — Telehealth: Payer: Self-pay | Admitting: *Deleted

## 2022-01-24 NOTE — Patient Instructions (Addendum)
Visit Information  Thank you for taking time to visit with me today. Please don't hesitate to contact me if I can be of assistance to you.   Following are the goals we discussed today:   Goals Addressed               This Visit's Progress     Patient Stated     management of diabetes ([facility]) (pt-stated)   Not on track     Care Coordination Interventions: Counseled on importance of regular laboratory monitoring as prescribed Discussed plans with patient for ongoing care management follow up and provided patient with direct contact information for care management team Review of patient status, including review of consultants reports, relevant laboratory and other test results, and medications completed Screening for signs and symptoms of depression related to chronic disease state  Assessed social determinant of health barriers Reviewed dietary better choices with Mrs Kohles ( Stevia, Sweet potatoes, snacks, serving sizes, food labels, fluids, ICE) Answered questions about meal planning        Our next appointment is by telephone on 02/25/22 at 2:30 pm  Please call the care guide team at [phone] if you need to cancel or reschedule your appointment.   If you are experiencing a Mental Health or [place] or need someone to talk to, please call the Suicide and Crisis Lifeline: 988 call the [place] National Suicide Prevention Lifeline: 6165904201 or TTY: (925)084-3182 TTY (325)518-5804) to talk to a trained counselor call 1-800-273-TALK (toll free, 24 hour hotline) call the [facility]: [phone] call 911   The patient verbalized understanding of instructions, educational materials, and care plan provided today and DECLINED offer to receive copy of patient instructions, educational materials, and care plan.   The patient has been provided with contact information for the care management team and has been advised to call with any health  related questions or concerns.   [place] Paul Hamman, RN, BSN, [place] Coordinator Office number [phone]

## 2022-01-25 ENCOUNTER — Ambulatory Visit: Payer: Self-pay | Admitting: *Deleted

## 2022-01-25 ENCOUNTER — Encounter: Payer: Medicare HMO | Admitting: *Deleted

## 2022-01-25 NOTE — Patient Outreach (Signed)
  Care Coordination   Follow Up Visit Note   01/26/2022 Name: [name] MRN: [id number] DOB: March 01, 1946  [name] is a 76 y.o. year old male who sees Paul Spikes, DO for primary care. I spoke with  [name] by phone today.  What matters to the patients health and wellness today?  Diabetes HgA1c elevated from 6.2 to 11.3 on 01/09/22  was not active taking in sweet tea and sugar free cookies discussed cinnamon  153 cbg lowest is 39  [name] was noted to have difficulty hearing the RN CM at times during the outreach and confirms he does not like talking a lot. Preferring to limit the initial assessment Mobility cane no use lately use when out no falls  He reports he does not want to do anything or go anywhere as he does not feel well after he stopped drinking alcohol.      Goals Addressed               This Visit's Progress     Patient Stated     management of diabetes ([facility]) (pt-stated)   Not on track     Care Coordination Interventions: Counseled on importance of regular laboratory monitoring as prescribed Discussed plans with patient for ongoing care management follow up and provided patient with direct contact information for care management team Review of patient status, including review of consultants reports, relevant laboratory and other test results, and medications completed Screening for signs and symptoms of depression related to chronic disease state  Assessed social determinant of health barriers Reviewed dietary better choices with [name] ( Stevia, Sweet potatoes, snacks, serving sizes, food labels, fluids, ICE) Answered questions about meal planning        SDOH assessments and interventions completed:  Yes  SDOH Interventions Today    Flowsheet Row Most Recent Value  SDOH Interventions   Depression Interventions/Treatment  Medication  Social Connections Interventions Other (Comment)  [attempted to engage  to offer further [facility] resources but unsuccessful]        Care Coordination Interventions Activated:  Yes  Care Coordination Interventions:  Yes, provided   Follow up plan: Follow up call scheduled for 02/26/22 1 pm    Encounter Outcome:  Pt. Visit Completed   [name] L. Lavina Hamman, RN, BSN, [place] Coordinator Office number [phone]

## 2022-01-26 NOTE — Patient Instructions (Signed)
Visit Information  Thank you for taking time to visit with me today. Please don't hesitate to contact me if I can be of assistance to you.   Following are the goals we discussed today:   Goals Addressed               This Visit's Progress     Patient Stated     management of diabetes ([facility]) (pt-stated)   Not on track     Care Coordination Interventions: Counseled on importance of regular laboratory monitoring as prescribed Discussed plans with patient for ongoing care management follow up and provided patient with direct contact information for care management team Review of patient status, including review of consultants reports, relevant laboratory and other test results, and medications completed Screening for signs and symptoms of depression related to chronic disease state  Assessed social determinant of health barriers Reviewed dietary better choices with Mrs Mannis ( Stevia, Sweet potatoes, snacks, serving sizes, food labels, fluids, ICE) Answered questions about meal planning        Our next appointment is by telephone on 02/26/22 at 1 pm   Please call the care guide team at [phone] if you need to cancel or reschedule your appointment.   If you are experiencing a Mental Health or [place] or need someone to talk to, please call the Suicide and Crisis Lifeline: 988 call the [place] National Suicide Prevention Lifeline: 229-518-5305 or TTY: 303 173 4877 TTY 680 526 2081) to talk to a trained counselor call 1-800-273-TALK (toll free, 24 hour hotline) call the [facility]: [phone] call 911   The patient verbalized understanding of instructions, educational materials, and care plan provided today and DECLINED offer to receive copy of patient instructions, educational materials, and care plan.   The patient has been provided with contact information for the care management team and has been advised to call with any health  related questions or concerns.   [place] Lavina Hamman, RN, BSN, [place] Coordinator Office number [phone]

## 2022-01-30 ENCOUNTER — Encounter (INDEPENDENT_AMBULATORY_CARE_PROVIDER_SITE_OTHER): Payer: Self-pay | Admitting: Gastroenterology

## 2022-01-30 ENCOUNTER — Ambulatory Visit (INDEPENDENT_AMBULATORY_CARE_PROVIDER_SITE_OTHER): Payer: Medicare HMO | Admitting: Gastroenterology

## 2022-01-30 VITALS — BP 129/65 | HR 57 | Temp 97.9°F | Ht 71.0 in | Wt 167.1 lb

## 2022-01-30 DIAGNOSIS — K5010 Crohn's disease of large intestine without complications: Secondary | ICD-10-CM | POA: Diagnosis not present

## 2022-01-30 NOTE — Progress Notes (Addendum)
Referring Provider: Coral Spikes, DO Primary Care Physician:  Coral Spikes, DO Primary GI Physician: new  Chief Complaint  Patient presents with   Follow-up    Patient here today for a follow up on crohn's. He says he has some issues with constipation alternates with diarrhea, and fecal incontinence. He is also having issues with gas. Denies any dark or bloody stools. He has occasional abdominal pains.    HPI:   [name] is a 76 y.o. male with past medical history of prostate cancer, anxiety, CAD, Crohn's disease, CTS, depression, DM type 2, HLD, HTN.   Patient presenting today as a new patient for Crohn's disease.   Per chart review, appears to have been diagnosed in 2002, Patient last seen by GI in 2018, Dr. Scarlette Shorts with [facility]. At that time he had been receiving care at the [facility] hospital, having intermittent constipation for which he was taking colace and fiber. Also with intermittent rectal bleeding. No abdominal pain. Previously mildly elevated LFTs. Patient was recommended to have updated colonoscopy, however, it appears he was lost to follow up for this. Advised to continue on Mesalamine   Most recent labs 01/22/22: hgb 11.4 (anemia appears chronic for the past 3 years atleast, LFTs WNL, Creat 1.73  Present: Patient reports that he has had both constipation and diarrhea with fecal incontinence. He states for the past few weeks he has felt better and the diarrhea has mostly resolved.  Constipation has also has improved.  States he may go maybe one day without a BM but this does not happen often, sometimes can drink coffee which will induce a BM. He did take an ex lax after recent hospitalization for constipation and has had no issues since then. He is 1-3 BMs per day he tells me diarrhea resolved though this morning he had somewhat of an "explosive" BM, otherwise stools have been mostly formed. Denies rectal bleeding or melena. He has very occasional abdominal pain  just above the umbilicus. States that pain resolves on its own without precipitating factors. Patient and wife do not think he is currently on any medication for his Crohn's disease? Unsure of the last time he was on therapy for this. Notably Lialda is listed in his chart but they do not think he is taking this. His wife left his updated med list at home. At this times he feels that he is doing well from a GI standpoint.  He was recently was taken off of his metformin due to PCP thinking this was contributing to his diarrhea, but blood sugars went up to 500 and he was restarted on Metformin during recent hospitalization a few weeks ago. Denies any recent antibiotic therapy.   Reports that he had a colonoscopy a few months ago with the [place], patient's wife states that they were told this was "the best his colon had looked."  Is maintained on omeprazole 67m daily, he denies any episodes of heartburn or acid regurgitation. No dysphagia or odynophagia. Denies n/v. Denies changes in appetite or early satiety. Wife does report he lost about 10 pounds since hospital admission. States they are unsure what he needs to be eating. He eats a lot of chicken, veggies and eggs.   Extraintestinal Manifestations: Skin: denies Joints: denies Eyes: denies changes, legally blind  NSAID use: none  Social hx: no etoh or tobacco  Fam hx: no significant family history   KUB: 12/14/21: negative Last Colonoscopy:2009 no evidence of active colitis, states  they had more recent colonoscopy a few months ago   Recommendations:    Past Medical History:  Diagnosis Date   Adenocarcinoma of prostate ([place])    Anxiety    CAD (coronary artery disease)    a. s/p prior stenting of LAD in 1996 and angioplasty alone in 1999 to LAD by review of prior notes.    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    Crohn's disease ([place])    CTS (carpal tunnel syndrome)    Mild   Depression    Diabetes mellitus without complication (HCC)    Type 2    Hyperlipidemia    Hypertension    Kidney stone    Optic neuritis     Past Surgical History:  Procedure Laterality Date   COLONOSCOPY     CORONARY ARTERY BYPASS GRAFT N/A 04/01/2018   Procedure: CORONARY ARTERY BYPASS GRAFTING (CABG) TIMES FOUR: (LIMA to LAD, CRYO VEIN to OM1, SVG to OM2, and SVG to PDA) with PARTIAL EVH/OPEN from RIGHT and LEFT GREATER SAPHENOUS VEIN and LEFT INTERNAL MAMMARY ARTERY;  Surgeon: [name], MD;  Location: [facility];  Service: Open Heart Surgery;  Laterality: N/A;   KNEE CARTILAGE SURGERY Right    LEFT HEART CATH AND CORONARY ANGIOGRAPHY N/A 03/27/2018   Procedure: LEFT HEART CATH AND CORONARY ANGIOGRAPHY;  Surgeon: [name], MD;  Location: [facility];  Service: Cardiovascular;  Laterality: N/A;   LOWER EXTREMITY ANGIOGRAPHY N/A 08/13/2016   Procedure: Lower Extremity Angiography;  Surgeon: [name], MD;  Location: [facility];  Service: Cardiovascular;  Laterality: N/A;   NASAL SINUS SURGERY     PROSTATE SURGERY     TEE WITHOUT CARDIOVERSION N/A 04/01/2018   Procedure: TRANSESOPHAGEAL ECHOCARDIOGRAM (TEE);  Surgeon: [name], [name], MD;  Location: [facility];  Service: Open Heart Surgery;  Laterality: N/A;    Current Outpatient Medications  Medication Sig Dispense Refill   albuterol (VENTOLIN HFA) 108 (90 Base) MCG/ACT inhaler Inhale 2 puffs into the lungs every 4 (four) hours as needed for wheezing or shortness of breath (cough). 18 g 2   ascorbic acid (VITAMIN C) 500 MG tablet Take 1,000 mg by mouth daily.     aspirin EC 81 MG tablet Take 1 tablet (81 mg total) by mouth daily with breakfast. 30 tablet 12   Cholecalciferol 25 MCG (1000 UT) CHEW Chew 25 Units by mouth daily.     donepezil (ARICEPT) 5 MG tablet Take 1 tablet (5 mg total) by mouth at bedtime. 90 tablet    glimepiride (AMARYL) 2 MG tablet Take 1 tablet (2 mg total) by mouth 2 (two) times daily with a meal. 60 tablet 4   insulin glargine (LANTUS SOLOSTAR) 100 UNIT/ML  Solostar Pen Inject 10 Units into the skin at bedtime. 45 mL 11   Insulin Pen Needle 32G X 4 MM MISC 1 Needle by Does not apply route at bedtime. For insulin administration 100 each 5   ISOSORBIDE PO Take 5 mg by mouth daily.     mesalamine (LIALDA) 1.2 g EC tablet Take 1.2 g by mouth 2 (two) times daily.     mirtazapine (REMERON) 7.5 MG tablet Take 1 tablet (7.5 mg total) by mouth at bedtime. 30 tablet 5   omeprazole (PRILOSEC) 20 MG capsule Take 1 capsule (20 mg total) by mouth daily. 30 capsule 5   propranolol (INDERAL) 40 MG tablet Take 1 tablet (40 mg total) by mouth 2 (two) times daily. 60 tablet 5  risperiDONE (RISPERDAL) 0.5 MG tablet Take 1 tablet (0.5 mg total) by mouth at bedtime. 90 tablet 3   rosuvastatin (CRESTOR) 20 MG tablet Take 1 tablet (20 mg total) by mouth daily. 90 tablet 3   sertraline (ZOLOFT) 50 MG tablet Take 1 tablet (50 mg total) by mouth daily. 90 tablet 3   No current facility-administered medications for this visit.    Allergies as of 01/30/2022 - Review Complete 01/30/2022  Allergen Reaction Noted   Flomax [tamsulosin hcl]  07/23/2012   Morphine and related  07/23/2012    Family History  Problem Relation Age of Onset   Hypertension Mother    Colon cancer Mother    Heart attack Father    Coronary artery disease Father    Hypertension Sister    Crohn's disease Brother     Social History   Socioeconomic History   Marital status: Married    Spouse name: Not on file   Number of children: Not on file   Years of education: 1   Highest education level: Not on file  Occupational History   Occupation: Dealer  Tobacco Use   Smoking status: Former    Types: Cigarettes    Quit date: 02/19/2018    Years since quitting: 3.9   Smokeless tobacco: Never  Vaping Use   Vaping Use: Never used  Substance and Sexual Activity   Alcohol use: No   Drug use: No   Sexual activity: Not on file  Other Topics Concern   Not on file  Social History Narrative    Not on file   Social Determinants of Health   Financial Resource Strain: Low Risk  (01/24/2022)   Overall Financial Resource Strain (CARDIA)    Difficulty of Paying Living Expenses: Not hard at all  Food Insecurity: No Food Insecurity (01/24/2022)   Hunger Vital Sign    Worried About Running Out of Food in the Last Year: Never true    [place] in the Last Year: Never true  Transportation Needs: No Transportation Needs (01/24/2022)   PRAPARE - Hydrologist (Medical): No    Lack of Transportation (Non-Medical): No  Physical Activity: Not on file  Stress: No Stress Concern Present (01/24/2022)   [place]    Feeling of Stress : Only a little  Social Connections: Socially Isolated (01/26/2022)   Social Connection and Isolation Panel [NHANES]    Frequency of Communication with Friends and Family: Once a week    Frequency of Social Gatherings with Friends and Family: Once a week    Attends Religious Services: Never    Marine scientist or Organizations: No    Attends Music therapist: Never    Marital Status: Married    Review of systems General: negative for malaise, night sweats, fever, chills, weight loss Neck: Negative for lumps, goiter, pain and significant neck swelling Resp: Negative for cough, wheezing, dyspnea at rest CV: Negative for chest pain, leg swelling, palpitations, orthopnea GI: denies melena, hematochezia, nausea, vomiting, dysphagia, odyonophagia, early satiety or unintentional weight loss. +intermittent diarrhea and constipation +occasional periumbilical abdominal pain MSK: Negative for joint pain or swelling, back pain, and muscle pain. Derm: Negative for itching or rash Psych: Denies depression, anxiety, memory loss, confusion. No homicidal or suicidal ideation.  Heme: Negative for prolonged bleeding, bruising easily, and swollen nodes. Endocrine:  Negative for cold or heat intolerance, polyuria, polydipsia and goiter. Neuro:  negative for tremor, gait imbalance, syncope and seizures. The remainder of the review of systems is noncontributory.  Physical Exam: BP 129/65 (BP Location: Left Arm, Patient Position: Sitting, Cuff Size: Large)   Pulse (!) 57   Temp 97.9 F (36.6 C) (Oral)   Ht 5' 11"  (1.803 m)   Wt 167 lb 1.6 oz (75.8 kg)   BMI 23.31 kg/m  General:   Alert and oriented. No distress noted. Pleasant and cooperative.  Head:  Normocephalic and atraumatic. Eyes:  Conjuctiva clear without scleral icterus. Mouth:  Oral mucosa pink and moist. Good dentition. No lesions. Heart: Normal rate and rhythm, s1 and s2 heart sounds present.  Lungs: Clear lung sounds in all lobes. Respirations equal and unlabored. Abdomen:  +BS, soft, non-tender and non-distended. No rebound or guarding. No HSM or masses noted. Derm: No palmar erythema or jaundice Msk:  Symmetrical without gross deformities. Normal posture. Extremities:  Without edema. Neurologic:  Alert and  oriented x4 Psych:  Alert and cooperative. Normal mood and affect.  Invalid input(s): "6 MONTHS"   ASSESSMENT: [name] is a 77 y.o. male presenting today as a new patient for Crohn's disease.  Last colonoscopy in chart review in 2009 with no active inflammation, patient and wife report more recent colonoscopy done at the [place] a few months ago that was normal. They will work on getting a copy of these results to me for review. Patient reports previously having more diarrhea though he can sometimes have constipation as well, going one day without a BM. Metformin was stopped by PCP for concern this was causing diarrhea, though they note not much improvement in symptoms thereafter, was recently started back on Metformin due to hyperglycemia required admission. Diarrhea has mostly resolved and he is having 1-3 formed BMs per day now. He denies rectal bleeding. Reports some  periumbilical pain on occasion that is not precipitated by anything specific and resolves on its own. Patient and wife did not bring updated med list and do not think he is currently on anything for his Crohn's disease (appears to have previously been on Lialda 2.4g daily). They will also work on getting me a copy of updated med list. As patient feels he is doing well at this time, I will hold off until I receive recent colonoscopy results and med list before making further recommendations. No red flag symptoms. Patient denies melena, hematochezia, nausea, vomiting, dysphagia, odyonophagia, early satiety.    PLAN:  Obtain  colonoscopy records  2. obtain updated current med list  3. Further recommendations to follow   All questions were answered, patient verbalized understanding and is in agreement with plan as outlined above.    Follow Up: 6 months  Gage Treiber L. Alver Sorrow, MSN, APRN, AGNP-C Adult-Gerontology Nurse Practitioner [facility]  I have reviewed the note and agree with the APP's assessment as described in this progress note  Patient has presented a longstanding history of Crohn's colitis per medical chart.  Currently he is not present any complaints although he has been off therapy for an unknown timeframe.  It is unclear if he had some transient obstructive symptoms in the past that have resolved.  He reports having a colonoscopy relatively recently, will request this.  If colonoscopy was unremarkable, we may need to explore his chronic disease further with some cross-sectional abdominal imaging with a CT enterography.  Maylon Peppers, MD Gastroenterology and Hepatology [facility]

## 2022-01-30 NOTE — Patient Instructions (Signed)
It was nice to meet you! Please bring records from recent colonoscopy and current med list for me to review, I will discuss plan moving forward once I have reviewed this information  Follow up 6 months

## 2022-02-16 ENCOUNTER — Ambulatory Visit: Payer: Medicare HMO | Admitting: Family Medicine

## 2022-02-19 ENCOUNTER — Other Ambulatory Visit (HOSPITAL_COMMUNITY): Payer: Self-pay | Admitting: Nephrology

## 2022-02-19 DIAGNOSIS — D1771 Benign lipomatous neoplasm of kidney: Secondary | ICD-10-CM

## 2022-02-19 DIAGNOSIS — E1129 Type 2 diabetes mellitus with other diabetic kidney complication: Secondary | ICD-10-CM

## 2022-02-19 DIAGNOSIS — I739 Peripheral vascular disease, unspecified: Secondary | ICD-10-CM | POA: Diagnosis not present

## 2022-02-19 DIAGNOSIS — N189 Chronic kidney disease, unspecified: Secondary | ICD-10-CM | POA: Diagnosis not present

## 2022-02-19 DIAGNOSIS — D638 Anemia in other chronic diseases classified elsewhere: Secondary | ICD-10-CM

## 2022-02-19 DIAGNOSIS — I2510 Atherosclerotic heart disease of native coronary artery without angina pectoris: Secondary | ICD-10-CM | POA: Diagnosis not present

## 2022-02-19 DIAGNOSIS — K5090 Crohn's disease, unspecified, without complications: Secondary | ICD-10-CM | POA: Diagnosis not present

## 2022-02-19 DIAGNOSIS — I351 Nonrheumatic aortic (valve) insufficiency: Secondary | ICD-10-CM | POA: Diagnosis not present

## 2022-02-19 DIAGNOSIS — R809 Proteinuria, unspecified: Secondary | ICD-10-CM | POA: Diagnosis not present

## 2022-02-19 DIAGNOSIS — I5032 Chronic diastolic (congestive) heart failure: Secondary | ICD-10-CM | POA: Diagnosis not present

## 2022-02-19 DIAGNOSIS — E1122 Type 2 diabetes mellitus with diabetic chronic kidney disease: Secondary | ICD-10-CM

## 2022-02-19 DIAGNOSIS — K760 Fatty (change of) liver, not elsewhere classified: Secondary | ICD-10-CM | POA: Diagnosis not present

## 2022-02-19 DIAGNOSIS — N2581 Secondary hyperparathyroidism of renal origin: Secondary | ICD-10-CM | POA: Diagnosis not present

## 2022-02-19 DIAGNOSIS — E1151 Type 2 diabetes mellitus with diabetic peripheral angiopathy without gangrene: Secondary | ICD-10-CM | POA: Diagnosis not present

## 2022-02-19 DIAGNOSIS — K7460 Unspecified cirrhosis of liver: Secondary | ICD-10-CM | POA: Diagnosis not present

## 2022-02-22 ENCOUNTER — Ambulatory Visit: Payer: Medicare HMO | Admitting: Family Medicine

## 2022-02-26 ENCOUNTER — Ambulatory Visit (INDEPENDENT_AMBULATORY_CARE_PROVIDER_SITE_OTHER): Payer: Medicare HMO | Admitting: Family Medicine

## 2022-02-26 ENCOUNTER — Ambulatory Visit: Payer: Self-pay | Admitting: *Deleted

## 2022-02-26 VITALS — BP 152/65 | HR 64 | Temp 98.2°F | Ht 71.0 in | Wt 168.0 lb

## 2022-02-26 DIAGNOSIS — I5032 Chronic diastolic (congestive) heart failure: Secondary | ICD-10-CM | POA: Diagnosis not present

## 2022-02-26 DIAGNOSIS — Z8679 Personal history of other diseases of the circulatory system: Secondary | ICD-10-CM | POA: Diagnosis not present

## 2022-02-26 DIAGNOSIS — F39 Unspecified mood [affective] disorder: Secondary | ICD-10-CM

## 2022-02-26 DIAGNOSIS — Z1159 Encounter for screening for other viral diseases: Secondary | ICD-10-CM | POA: Diagnosis not present

## 2022-02-26 DIAGNOSIS — Z131 Encounter for screening for diabetes mellitus: Secondary | ICD-10-CM | POA: Diagnosis not present

## 2022-02-26 DIAGNOSIS — R809 Proteinuria, unspecified: Secondary | ICD-10-CM | POA: Diagnosis not present

## 2022-02-26 DIAGNOSIS — E1151 Type 2 diabetes mellitus with diabetic peripheral angiopathy without gangrene: Secondary | ICD-10-CM

## 2022-02-26 DIAGNOSIS — D638 Anemia in other chronic diseases classified elsewhere: Secondary | ICD-10-CM | POA: Diagnosis not present

## 2022-02-26 DIAGNOSIS — K760 Fatty (change of) liver, not elsewhere classified: Secondary | ICD-10-CM | POA: Insufficient documentation

## 2022-02-26 DIAGNOSIS — N189 Chronic kidney disease, unspecified: Secondary | ICD-10-CM | POA: Diagnosis not present

## 2022-02-26 DIAGNOSIS — I351 Nonrheumatic aortic (valve) insufficiency: Secondary | ICD-10-CM | POA: Diagnosis not present

## 2022-02-26 DIAGNOSIS — E1129 Type 2 diabetes mellitus with other diabetic kidney complication: Secondary | ICD-10-CM | POA: Diagnosis not present

## 2022-02-26 DIAGNOSIS — I2510 Atherosclerotic heart disease of native coronary artery without angina pectoris: Secondary | ICD-10-CM | POA: Diagnosis not present

## 2022-02-26 DIAGNOSIS — I739 Peripheral vascular disease, unspecified: Secondary | ICD-10-CM | POA: Diagnosis not present

## 2022-02-26 DIAGNOSIS — Z79899 Other long term (current) drug therapy: Secondary | ICD-10-CM | POA: Diagnosis not present

## 2022-02-26 DIAGNOSIS — E1122 Type 2 diabetes mellitus with diabetic chronic kidney disease: Secondary | ICD-10-CM | POA: Diagnosis not present

## 2022-02-26 MED ORDER — LANTUS SOLOSTAR 100 UNIT/ML ~~LOC~~ SOPN: 20.0000 [IU] | PEN_INJECTOR | Freq: Every day | SUBCUTANEOUS | 3 refills | Status: DC

## 2022-02-26 MED ORDER — SERTRALINE HCL 100 MG PO TABS: 100.0000 mg | ORAL_TABLET | Freq: Every day | ORAL | 3 refills | Status: DC

## 2022-02-26 NOTE — Patient Instructions (Signed)
Medications as prescribed - I have increased the Zoloft and decreased the Insulin to 20 units.  Follow up in 3 months.   Take care  Dr. Lacinda Axon
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
## 2022-02-26 NOTE — Patient Instructions (Addendum)
Visit Information  Thank you for taking time to visit with me today. Please don't hesitate to contact me if I can be of assistance to you.   Following are the goals we discussed today:   Goals Addressed               This Visit's Progress     Patient Stated     management of diabetes [facility]) (pt-stated)   On track     Care Coordination Interventions: Counseled on importance of regular laboratory monitoring as prescribed Discussed plans with patient for ongoing care management follow up and provided patient with direct contact information for care management team Review of patient status, including review of consultants reports, relevant laboratory and other test results, and medications completed Screening for signs and symptoms of depression related to chronic disease state  Assessed social determinant of health barriers To follow up with pcp 02/26/22 pending labs. Confirms cbg values continue to vary day to day but he is doing better with diabetic diet        Our next appointment is by telephone on 03/26/22  at 1 pm  Please call the care guide team at [phone] if you need to cancel or reschedule your appointment.   If you are experiencing a Mental Health or [place] or need someone to talk to, please call the Suicide and Crisis Lifeline: 988 call the [place] National Suicide Prevention Lifeline: 323-716-5908 or TTY: (719)606-2037 TTY 517-321-0966) to talk to a trained counselor call 1-800-273-TALK (toll free, 24 hour hotline) call the [facility]: 385-036-4336 call 911   The patient verbalized understanding of instructions, educational materials, and care plan provided today and DECLINED offer to receive copy of patient instructions, educational materials, and care plan.   The patient has been provided with contact information for the care management team and has been advised to call with any health related questions or concerns.   Kitt Minardi  L. Lavina Hamman, RN, BSN, [place] Coordinator Office number [phone]

## 2022-02-27 NOTE — Progress Notes (Signed)
Subjective:  Patient ID: [name], male    DOB: [birth year]  Age: 76 y.o. MRN: [id number]  CC: Chief Complaint  Patient presents with   Diabetes    Sugars are up and down     HPI:  76 year old male with extensive past medical history presents for follow-up.  Patient's blood sugars have been too tightly controlled as of late.  He has had multiple times where he has had a glucose less than 60.  He only has high readings when he is overzealous with his correction for hypoglycemia.  Needs dose reduction and insulin.  Currently taking 25 units of Lantus at night.  Also on Amaryl 2 mg twice daily.  He is now being followed by GI as well as nephrology.  Wife states that he seems to be quite anxious a bit.  Irritable.  Patient agrees.  He is on multiple psychiatric medications: Respite all, mirtazapine, Zoloft.  He is also on donepezil.  This is all been managed at the [place].  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   Type 2 diabetes mellitus ([place]) 06/02/2007   Hypercholesterolemia 06/02/2007   Essential hypertension 06/02/2007    Social Hx   Social History   Socioeconomic History   Marital status: Married    Spouse name: Not on file   Number of children: Not on file   Years of education: 1   Highest education level: Not on file  Occupational History   Occupation: Dealer  Tobacco Use   Smoking status: Former    Types: Cigarettes    Quit date: 02/19/2018    Years since quitting: 4.0    Smokeless tobacco: Never  Vaping Use   Vaping Use: Never used  Substance and Sexual Activity   Alcohol use: No   Drug use: No   Sexual activity: Not on file  Other Topics Concern   Not on file  Social History Narrative   Not on file   Social Determinants of Health   Financial Resource Strain: Low Risk  (01/24/2022)   Overall Financial Resource Strain (CARDIA)    Difficulty of Paying Living Expenses: Not hard at all  Food Insecurity: No Food Insecurity (01/24/2022)   Hunger Vital Sign    Worried About Running Out of Food in the Last Year: Never true    Ran Out of Food in the Last Year: Never true  Transportation Needs: No Transportation Needs (01/24/2022)   PRAPARE - Hydrologist (Medical): No    Lack of Transportation (Non-Medical): No  Physical Activity: Not on file  Stress: No Stress Concern Present (01/24/2022)   [place]    Feeling of Stress : Only a little  Social Connections: Socially Isolated (01/26/2022)   Social Connection and Isolation Panel [NHANES]    Frequency of Communication with Friends and Family: Once a week    Frequency of Social Gatherings with Friends and Family: Once a week    Attends Religious Services: Never    Active Member  of Clubs or Organizations: No    Attends Archivist Meetings: Never    Marital Status: Married    Review of Systems Per HPI  Objective:  BP (!) 152/65   Pulse 64   Temp 98.2 F (36.8 C)   Ht 5' 11"  (1.803 m)   Wt 168 lb (76.2 kg)   SpO2 97%   BMI 23.43 kg/m      02/26/2022    2:39 PM 02/26/2022    2:29 PM 01/30/2022    8:50 AM  BP/Weight  Systolic BP 330 076 226  Diastolic BP 65 74 65  Wt. (Lbs)  168 167.1  BMI  23.43 kg/m2 23.31 kg/m2    Physical Exam Vitals and nursing note reviewed.  Constitutional:      General: He is not in acute distress.    Appearance: Normal appearance.  HENT:     Head:  Normocephalic and atraumatic.  Cardiovascular:     Rate and Rhythm: Normal rate and regular rhythm.  Pulmonary:     Effort: Pulmonary effort is normal.     Breath sounds: Normal breath sounds. No wheezing or rales.  Abdominal:     General: There is no distension.     Palpations: Abdomen is soft.     Tenderness: There is no abdominal tenderness.  Neurological:     Mental Status: He is alert.  Psychiatric:        Behavior: Behavior normal.     Comments: Flat affect.     Lab Results  Component Value Date   WBC 9.6 01/22/2022   HGB 11.4 (L) 01/22/2022   HCT 35.0 (L) 01/22/2022   PLT 228 01/22/2022   GLUCOSE 86 01/22/2022   CHOL 163 11/16/2021   TRIG 145 11/16/2021   HDL 47 11/16/2021   LDLCALC 91 11/16/2021   ALT 25 01/22/2022   AST 24 01/22/2022   NA 141 01/22/2022   K 4.1 01/22/2022   CL 106 01/22/2022   CREATININE 1.73 (H) 01/22/2022   BUN 32 (H) 01/22/2022   CO2 20 01/22/2022   TSH 1.707 03/28/2018   INR 1.76 04/01/2018   HGBA1C 11.3 (H) 01/09/2022     Assessment & Plan:   Problem List Items Addressed This Visit       Endocrine   Type 2 diabetes mellitus ([place])    Too tightly controlled.  Ongoing hypoglycemia.  Decreasing insulin to 20 units nightly.      Relevant Medications   insulin glargine (LANTUS SOLOSTAR) 100 UNIT/ML Solostar Pen     Other   Mood disorder ([place]) - Primary    Ongoing irritability and anxiety.  Increasing Zoloft.       Meds ordered this encounter  Medications   insulin glargine (LANTUS SOLOSTAR) 100 UNIT/ML Solostar Pen    Sig: Inject 20 Units into the skin at bedtime.    Dispense:  15 mL    Refill:  3   sertraline (ZOLOFT) 100 MG tablet    Sig: Take 1 tablet (100 mg total) by mouth daily.    Dispense:  90 tablet    Refill:  3    Follow-up:  3 months  Dierks DO [place]

## 2022-02-27 NOTE — Assessment & Plan Note (Signed)
Too tightly controlled.  Ongoing hypoglycemia.  Decreasing insulin to 20 units nightly.

## 2022-02-27 NOTE — Assessment & Plan Note (Signed)
Ongoing irritability and anxiety.  Increasing Zoloft.

## 2022-02-28 ENCOUNTER — Ambulatory Visit (HOSPITAL_COMMUNITY)
Admission: RE | Admit: 2022-02-28 | Discharge: 2022-02-28 | Disposition: A | Discharge status: Home / Self Care | Payer: Medicare HMO | Source: Ambulatory Visit | Attending: Nephrology | Admitting: Nephrology

## 2022-02-28 DIAGNOSIS — D638 Anemia in other chronic diseases classified elsewhere: Secondary | ICD-10-CM | POA: Diagnosis not present

## 2022-02-28 DIAGNOSIS — R809 Proteinuria, unspecified: Secondary | ICD-10-CM | POA: Diagnosis not present

## 2022-02-28 DIAGNOSIS — D1771 Benign lipomatous neoplasm of kidney: Secondary | ICD-10-CM | POA: Insufficient documentation

## 2022-02-28 DIAGNOSIS — E1129 Type 2 diabetes mellitus with other diabetic kidney complication: Secondary | ICD-10-CM | POA: Diagnosis not present

## 2022-02-28 DIAGNOSIS — E1122 Type 2 diabetes mellitus with diabetic chronic kidney disease: Secondary | ICD-10-CM | POA: Insufficient documentation

## 2022-02-28 DIAGNOSIS — N289 Disorder of kidney and ureter, unspecified: Secondary | ICD-10-CM | POA: Diagnosis not present

## 2022-03-06 ENCOUNTER — Ambulatory Visit: Payer: Self-pay | Admitting: *Deleted

## 2022-03-06 ENCOUNTER — Telehealth: Payer: Self-pay | Admitting: Family Medicine

## 2022-03-06 NOTE — Patient Instructions (Addendum)
Visit Information  Thank you for taking time to visit with me today. Please don't hesitate to contact me if I can be of assistance to you.   Following are the goals we discussed today:   Goals Addressed               This Visit's Progress     Patient Stated     management of diabetes [facility]) (pt-stated)   Not on track     Care Coordination Interventions: Provided education to patient about basic DM disease process Reviewed medications with patient and discussed importance of medication adherence Discussed plans with patient for ongoing care management follow up and provided patient with direct contact information for care management team Review of patient status, including review of consultants reports, relevant laboratory and other test results, and medications completed Reviewed symptoms of hypoglycemia to monitor for and reviewed his medicines Discussed high protein night snacks, importance Received permission to outreach to his pcp office about his continued hypoglycemia for care advice  Encouragement provided        Our next appointment is by telephone on 03/26/22 at 1 pm  Please call the care guide team at [phone] if you need to cancel or reschedule your appointment.   If you are experiencing a Mental Health or [place] or need someone to talk to, please call the Suicide and Crisis Lifeline: 988 call the [place] National Suicide Prevention Lifeline: 551-045-7943 or TTY: 463-673-7267 TTY 8607277291) to talk to a trained counselor call 1-800-273-TALK (toll free, 24 hour hotline) call the [facility]: 7543637746 call 911   The patient verbalized understanding of instructions, educational materials, and care plan provided today and DECLINED offer to receive copy of patient instructions, educational materials, and care plan.   The patient has been provided with contact information for the care management team and has been advised to  call with any health related questions or concerns.   Dawayne Ohair L. Lavina Hamman, RN, BSN, [place] Coordinator Office number [phone]

## 2022-03-06 NOTE — Telephone Encounter (Signed)
[place], [place], DO  [place], Lavella Lemons, LPN Please advise patient to discontinue Amaryl and cut back on insulin to 15 units.  Previous Messages  ----- Message ----- From: Barbaraann Faster, RN Sent: 03/06/2022   3:05 PM EST To: Coral Spikes, DO Subject: continues to have hypoglycemia episodes sinc*  Good afternoon Finished assessing [name] He continues to report hypoglycemia even after Dr Lacinda Axon decreased his night insulin to 20 mg His wife reports he eats dinner, takes his insuline and immediately goes to bed but has to awaken around midnight are after as the cbg has lowered. He eats candy and.or a cookie and it still is 75-80s upon awakening. He was encouraged to take in protein foods prior to the insulin  Can we have Dr Lacinda Axon to advise if the dosage needs further decreasing?   Thank you! Kimberly L. Lavina Hamman, RN, BSN, [place] Coordinator Office number [phone]    Pt contacted and wrote down instructions. Pt may get wife to call back tomorrow to reiterate instructions

## 2022-03-06 NOTE — Patient Outreach (Signed)
  Care Coordination   Follow Up Visit Note   03/06/2022 Name: [name] MRN: [id number] DOB: Sep 07, 1945  [name] is a 76 y.o. year old male who sees Paul Spikes, DO for primary care. I spoke with  [name] and his wife, [name] by phone today.  What matters to the patients health and wellness today?   Hypoglycemia episodes Now having to eat cookies or candy around midnight. Having lows in mornings.  Reviewed he last office visit. Insulin dose now 20 units at night E\His wife reports the patient eats dinner, takes his dose of insulin. Then go to bed. He wakes up generally around midnight with hypoglycemia symptoms- irritability, nervous, hunger   Goals Addressed               This Visit's Progress     Patient Stated     management of diabetes ([facility]) (pt-stated)   Not on track     Care Coordination Interventions: Provided education to patient about basic DM disease process Reviewed medications with patient and discussed importance of medication adherence Discussed plans with patient for ongoing care management follow up and provided patient with direct contact information for care management team Review of patient status, including review of consultants reports, relevant laboratory and other test results, and medications completed Reviewed symptoms of hypoglycemia to monitor for and reviewed his medicines Discussed high protein night snacks, importance Received permission to outreach to his pcp office about his continued hypoglycemia for care advice  Encouragement provided        SDOH assessments and interventions completed:  No     Care Coordination Interventions Activated:  Yes  Care Coordination Interventions:  Yes, provided   Follow up plan: Follow up call scheduled for 03/26/22    Encounter Outcome:  Pt. Visit Completed   [name] L. Lavina Hamman, RN, BSN, [place] Coordinator Office number [phone]

## 2022-03-07 ENCOUNTER — Telehealth: Payer: Self-pay

## 2022-03-07 MED ORDER — SERTRALINE HCL 100 MG PO TABS: 100.0000 mg | ORAL_TABLET | Freq: Every day | ORAL | 3 refills | Status: DC

## 2022-03-07 NOTE — Telephone Encounter (Signed)
Medication resent to pharmacy. Left message to return call

## 2022-03-07 NOTE — Telephone Encounter (Signed)
Caller name: DEONTRAY HUNNICUTT  On DPR?: Yes  Call back number: [phone] (mobile)  Provider they see: Coral Spikes, DO  Reason for call:Pt called and medication the pharmacy did not receive and [name] is wanting this recent to the pharmacy sertraline (ZOLOFT) 100 MG table [place], [place]

## 2022-03-13 ENCOUNTER — Encounter: Payer: Medicare HMO | Admitting: Nurse Practitioner

## 2022-03-20 ENCOUNTER — Telehealth: Payer: Self-pay | Admitting: Family Medicine

## 2022-03-20 NOTE — Telephone Encounter (Signed)
Patient's wife [name] wanting to know if you have heard anything from the kidney specialist. Because they havent heard any thing from them since there visit in October.

## 2022-03-20 NOTE — Telephone Encounter (Signed)
Pt wife [name] contacted. Wife states they seen kidney specialist is October but has not heard from them since. Advised pt to call [place] and inquire about follow up. Pt wife verbalized understanding

## 2022-03-21 ENCOUNTER — Encounter: Payer: Medicare HMO | Admitting: Nurse Practitioner

## 2022-03-26 ENCOUNTER — Ambulatory Visit: Payer: Self-pay | Admitting: *Deleted

## 2022-03-26 NOTE — Patient Outreach (Signed)
  Care Coordination   03/26/2022 Name: [name] MRN: [id number] DOB: 23-Sep-1945   Care Coordination Outreach Attempts:  An unsuccessful telephone outreach was attempted today to offer the patient information about available care coordination services as a benefit of their health plan.   Follow Up Plan:  Additional outreach attempts will be made to offer the patient care coordination information and services.   Encounter Outcome:  No Answer   Care Coordination Interventions:  No, not indicated      Delorean Knutzen L. Lavina Hamman, RN, BSN, [place] Coordinator Office number [phone]

## 2022-04-03 ENCOUNTER — Ambulatory Visit: Payer: Self-pay | Admitting: *Deleted

## 2022-04-03 NOTE — Patient Instructions (Addendum)
Visit Information  Thank you for taking time to visit with me today. Please don't hesitate to contact me if I can be of assistance to you.   Following are the goals we discussed today:   Goals Addressed               This Visit's Progress     Patient Stated     management of diabetes [name]) (pt-stated)   On track     Care Coordination Interventions: Reviewed medications with patient and discussed importance of medication adherence Discussed plans with patient for ongoing care management follow up and provided patient with direct contact information for care management team  Continue to encourage high protein night snacks Confirmed his insulin was decreased Encouragement provided        Our next appointment is by telephone on 05/22/22 at 1 pm  Please call the care guide team at [phone] if you need to cancel or reschedule your appointment.   If you are experiencing a Mental Health or [place] or need someone to talk to, please call the Suicide and Crisis Lifeline: 988 call the [place] National Suicide Prevention Lifeline: 4321777234 or TTY: (253) 295-2520 TTY 719-109-4033) to talk to a trained counselor call 1-800-273-TALK (toll free, 24 hour hotline) call the [facility]: [phone] call 911   The patient verbalized understanding of instructions, educational materials, and care plan provided today and DECLINED offer to receive copy of patient instructions, educational materials, and care plan.   The patient has been provided with contact information for the care management team and has been advised to call with any health related questions or concerns.   Hermie Reagor L. Lavina Hamman, RN, BSN, [place] Coordinator Office number [phone]

## 2022-04-03 NOTE — Patient Outreach (Signed)
  Care Coordination   Follow Up Visit Note   04/03/2022 Name: [name] MRN: [id number] DOB: Oct 17, 1945  [name] is a 76 y.o. year old male who sees Coral Spikes, DO for primary care. I spoke with  [name] by phone today.  What matters to the patients health and wellness today?  "Doing good' Not many hypoglycemia episodes as his insulin dose was decreased during his 02/26/22 pcp visit. Still need to monitor the intake of sweets  Nephrology visit was scheduled for 04/19/22  Agree to further follow up in 2024    Goals Addressed               This Visit's Progress     Patient Stated     management of diabetes [facility]) (pt-stated)   On track     Care Coordination Interventions: Reviewed medications with patient and discussed importance of medication adherence Discussed plans with patient for ongoing care management follow up and provided patient with direct contact information for care management team  Continue to encourage high protein night snacks Confirmed his insulin was decreased Encouragement provided        SDOH assessments and interventions completed:  No     Care Coordination Interventions:  Yes, provided   Follow up plan: Follow up call scheduled for 05/22/22    Encounter Outcome:  Pt. Visit Completed   Paul Colberg L. Lavina Hamman, RN, BSN, [place] Coordinator Office number [phone]

## 2022-04-05 ENCOUNTER — Encounter: Payer: Self-pay | Admitting: *Deleted

## 2022-04-23 DIAGNOSIS — K219 Gastro-esophageal reflux disease without esophagitis: Secondary | ICD-10-CM | POA: Insufficient documentation

## 2022-04-23 DIAGNOSIS — Z87891 Personal history of nicotine dependence: Secondary | ICD-10-CM | POA: Insufficient documentation

## 2022-04-23 DIAGNOSIS — N2889 Other specified disorders of kidney and ureter: Secondary | ICD-10-CM | POA: Insufficient documentation

## 2022-04-23 DIAGNOSIS — Z955 Presence of coronary angioplasty implant and graft: Secondary | ICD-10-CM | POA: Insufficient documentation

## 2022-04-23 DIAGNOSIS — Z9181 History of falling: Secondary | ICD-10-CM | POA: Insufficient documentation

## 2022-04-23 DIAGNOSIS — Z87442 Personal history of urinary calculi: Secondary | ICD-10-CM | POA: Insufficient documentation

## 2022-04-23 DIAGNOSIS — I129 Hypertensive chronic kidney disease with stage 1 through stage 4 chronic kidney disease, or unspecified chronic kidney disease: Secondary | ICD-10-CM | POA: Insufficient documentation

## 2022-04-23 DIAGNOSIS — Z7984 Long term (current) use of oral hypoglycemic drugs: Secondary | ICD-10-CM | POA: Insufficient documentation

## 2022-04-23 DIAGNOSIS — G5600 Carpal tunnel syndrome, unspecified upper limb: Secondary | ICD-10-CM | POA: Insufficient documentation

## 2022-04-23 DIAGNOSIS — Z951 Presence of aortocoronary bypass graft: Secondary | ICD-10-CM | POA: Insufficient documentation

## 2022-04-23 DIAGNOSIS — Z7982 Long term (current) use of aspirin: Secondary | ICD-10-CM | POA: Insufficient documentation

## 2022-04-23 DIAGNOSIS — I083 Combined rheumatic disorders of mitral, aortic and tricuspid valves: Secondary | ICD-10-CM | POA: Insufficient documentation

## 2022-04-23 DIAGNOSIS — E785 Hyperlipidemia, unspecified: Secondary | ICD-10-CM | POA: Insufficient documentation

## 2022-04-30 DIAGNOSIS — R809 Proteinuria, unspecified: Secondary | ICD-10-CM | POA: Diagnosis not present

## 2022-04-30 DIAGNOSIS — N189 Chronic kidney disease, unspecified: Secondary | ICD-10-CM | POA: Diagnosis not present

## 2022-04-30 DIAGNOSIS — E1122 Type 2 diabetes mellitus with diabetic chronic kidney disease: Secondary | ICD-10-CM | POA: Diagnosis not present

## 2022-04-30 DIAGNOSIS — R778 Other specified abnormalities of plasma proteins: Secondary | ICD-10-CM | POA: Diagnosis not present

## 2022-04-30 DIAGNOSIS — E8722 Chronic metabolic acidosis: Secondary | ICD-10-CM | POA: Diagnosis not present

## 2022-04-30 DIAGNOSIS — N2889 Other specified disorders of kidney and ureter: Secondary | ICD-10-CM | POA: Diagnosis not present

## 2022-04-30 DIAGNOSIS — E1129 Type 2 diabetes mellitus with other diabetic kidney complication: Secondary | ICD-10-CM | POA: Diagnosis not present

## 2022-04-30 DIAGNOSIS — D638 Anemia in other chronic diseases classified elsewhere: Secondary | ICD-10-CM | POA: Diagnosis not present

## 2022-04-30 DIAGNOSIS — R760 Raised antibody titer: Secondary | ICD-10-CM | POA: Diagnosis not present

## 2022-05-01 ENCOUNTER — Other Ambulatory Visit: Payer: Self-pay | Admitting: Nephrology

## 2022-05-01 DIAGNOSIS — E1122 Type 2 diabetes mellitus with diabetic chronic kidney disease: Secondary | ICD-10-CM

## 2022-05-01 DIAGNOSIS — N189 Chronic kidney disease, unspecified: Secondary | ICD-10-CM

## 2022-05-10 DIAGNOSIS — H52223 Regular astigmatism, bilateral: Secondary | ICD-10-CM | POA: Diagnosis not present

## 2022-05-10 DIAGNOSIS — H2513 Age-related nuclear cataract, bilateral: Secondary | ICD-10-CM | POA: Diagnosis not present

## 2022-05-10 DIAGNOSIS — E119 Type 2 diabetes mellitus without complications: Secondary | ICD-10-CM | POA: Diagnosis not present

## 2022-05-10 DIAGNOSIS — Z7984 Long term (current) use of oral hypoglycemic drugs: Secondary | ICD-10-CM | POA: Diagnosis not present

## 2022-05-10 DIAGNOSIS — H524 Presbyopia: Secondary | ICD-10-CM | POA: Diagnosis not present

## 2022-05-10 LAB — HM DIABETES EYE EXAM

## 2022-05-22 ENCOUNTER — Ambulatory Visit: Payer: Self-pay

## 2022-05-22 ENCOUNTER — Other Ambulatory Visit (HOSPITAL_COMMUNITY): Payer: Self-pay | Admitting: Nephrology

## 2022-05-22 DIAGNOSIS — R778 Other specified abnormalities of plasma proteins: Secondary | ICD-10-CM

## 2022-05-22 DIAGNOSIS — E1122 Type 2 diabetes mellitus with diabetic chronic kidney disease: Secondary | ICD-10-CM

## 2022-05-22 DIAGNOSIS — N2889 Other specified disorders of kidney and ureter: Secondary | ICD-10-CM

## 2022-05-22 DIAGNOSIS — R760 Raised antibody titer: Secondary | ICD-10-CM

## 2022-05-22 DIAGNOSIS — R809 Proteinuria, unspecified: Secondary | ICD-10-CM

## 2022-05-22 DIAGNOSIS — D638 Anemia in other chronic diseases classified elsewhere: Secondary | ICD-10-CM

## 2022-05-22 DIAGNOSIS — N189 Chronic kidney disease, unspecified: Secondary | ICD-10-CM

## 2022-05-22 NOTE — Patient Outreach (Signed)
  Care Coordination   Follow Up Visit Note   05/22/2022 Name: [name] MRN: [id number] DOB: October 21, 1945  [name] is a 77 y.o. year old male who sees Coral Spikes, DO for primary care. I spoke with  [name] by phone today.  What matters to the patients health and wellness today?  none    Goals Addressed               This Visit's Progress     management of diabetes ([facility]) (pt-stated)        Care Coordination Interventions: Reviewed medications with patient and discussed importance of medication adherence Discussed plans with patient for ongoing care management follow up and provided patient with direct contact information for care management team  Blood sugar reports around low 100's.  No blood sugars reported.   Continue to encourage high protein night snacks Encouragement provided        SDOH assessments and interventions completed:  Yes     Care Coordination Interventions:  Yes, provided   Follow up plan: Follow up call scheduled for February    Encounter Outcome:  Pt. Visit Completed   Jone Baseman, RN, MSN [place] Management Care Management Coordinator Direct Line [phone]

## 2022-05-22 NOTE — Patient Instructions (Signed)
Visit Information  Thank you for taking time to visit with me today. Please don't hesitate to contact me if I can be of assistance to you.   Following are the goals we discussed today:   Goals Addressed               This Visit's Progress     management of diabetes ([facility]) (pt-stated)        Care Coordination Interventions: Reviewed medications with patient and discussed importance of medication adherence Discussed plans with patient for ongoing care management follow up and provided patient with direct contact information for care management team  Blood sugar reports around low 100's.  No blood sugars reported.   Continue to encourage high protein night snacks Encouragement provided        Our next appointment is by telephone on 06/12/22 at 1pm  Please call the care guide team at [phone] if you need to cancel or reschedule your appointment.   If you are experiencing a Mental Health or [place] or need someone to talk to, please call the Suicide and Crisis Lifeline: 988   The patient verbalized understanding of instructions, educational materials, and care plan provided today and DECLINED offer to receive copy of patient instructions, educational materials, and care plan.   Telephone follow up appointment with care management team member scheduled for: February  [name] J Joby Hershkowitz, RN, MSN [place] Management Care Management Coordinator Direct Line [phone]

## 2022-05-23 ENCOUNTER — Encounter: Payer: Self-pay | Admitting: *Deleted

## 2022-05-25 DIAGNOSIS — N2889 Other specified disorders of kidney and ureter: Secondary | ICD-10-CM | POA: Diagnosis not present

## 2022-05-25 DIAGNOSIS — R778 Other specified abnormalities of plasma proteins: Secondary | ICD-10-CM | POA: Diagnosis not present

## 2022-05-25 DIAGNOSIS — E8722 Chronic metabolic acidosis: Secondary | ICD-10-CM | POA: Diagnosis not present

## 2022-05-25 DIAGNOSIS — N189 Chronic kidney disease, unspecified: Secondary | ICD-10-CM | POA: Diagnosis not present

## 2022-05-25 DIAGNOSIS — R809 Proteinuria, unspecified: Secondary | ICD-10-CM | POA: Diagnosis not present

## 2022-05-25 DIAGNOSIS — E1122 Type 2 diabetes mellitus with diabetic chronic kidney disease: Secondary | ICD-10-CM | POA: Diagnosis not present

## 2022-05-25 DIAGNOSIS — D638 Anemia in other chronic diseases classified elsewhere: Secondary | ICD-10-CM | POA: Diagnosis not present

## 2022-05-25 DIAGNOSIS — E1129 Type 2 diabetes mellitus with other diabetic kidney complication: Secondary | ICD-10-CM | POA: Diagnosis not present

## 2022-05-25 DIAGNOSIS — R760 Raised antibody titer: Secondary | ICD-10-CM | POA: Diagnosis not present

## 2022-06-07 ENCOUNTER — Ambulatory Visit (HOSPITAL_COMMUNITY)
Admission: RE | Admit: 2022-06-07 | Discharge: 2022-06-07 | Disposition: A | Discharge status: Home / Self Care | Payer: Medicare HMO | Source: Ambulatory Visit | Attending: Nephrology | Admitting: Nephrology

## 2022-06-07 DIAGNOSIS — R778 Other specified abnormalities of plasma proteins: Secondary | ICD-10-CM | POA: Diagnosis not present

## 2022-06-07 DIAGNOSIS — N281 Cyst of kidney, acquired: Secondary | ICD-10-CM | POA: Diagnosis not present

## 2022-06-07 DIAGNOSIS — E1122 Type 2 diabetes mellitus with diabetic chronic kidney disease: Secondary | ICD-10-CM | POA: Diagnosis not present

## 2022-06-07 DIAGNOSIS — N2889 Other specified disorders of kidney and ureter: Secondary | ICD-10-CM | POA: Insufficient documentation

## 2022-06-07 DIAGNOSIS — R760 Raised antibody titer: Secondary | ICD-10-CM | POA: Insufficient documentation

## 2022-06-07 DIAGNOSIS — D638 Anemia in other chronic diseases classified elsewhere: Secondary | ICD-10-CM | POA: Diagnosis not present

## 2022-06-07 DIAGNOSIS — N189 Chronic kidney disease, unspecified: Secondary | ICD-10-CM | POA: Insufficient documentation

## 2022-06-07 DIAGNOSIS — R809 Proteinuria, unspecified: Secondary | ICD-10-CM | POA: Insufficient documentation

## 2022-06-07 MED ORDER — GADOBUTROL 1 MMOL/ML IV SOLN
7.5000 mL | Freq: Once | INTRAVENOUS | Status: AC | PRN
  Administered 2022-06-07: 7.5 mL via INTRAVENOUS

## 2022-06-12 ENCOUNTER — Ambulatory Visit: Payer: Self-pay | Admitting: *Deleted

## 2022-06-12 NOTE — Patient Outreach (Addendum)
  Care Coordination   06/12/2022 Name: [name] MRN: [id number] DOB: [birth year]   Care Coordination Outreach Attempts:  An unsuccessful telephone outreach was attempted today to offer the patient information about available care coordination services as a benefit of their health plan.  Line busy x 3  Follow Up Plan:  Additional outreach attempts will be made to offer the patient care coordination information and services.   Encounter Outcome:  No Answer   Care Coordination Interventions:  No, not indicated    Samik Balkcom L. Lavina Hamman, RN, BSN, [place] Coordinator Office number [phone]

## 2022-06-22 ENCOUNTER — Ambulatory Visit: Payer: Self-pay | Admitting: *Deleted

## 2022-06-22 NOTE — Patient Outreach (Signed)
  Care Coordination   06/22/2022 Name: [name] MRN: [id number] DOB: [birth year]   Care Coordination Outreach Attempts:  A second unsuccessful outreach was attempted today to offer the patient with information about available care coordination services as a benefit of their health plan.     Follow Up Plan:  Additional outreach attempts will be made to offer the patient care coordination information and services.   Encounter Outcome:  No Answer   Care Coordination Interventions:  No, not indicated    SIG Yussuf Sawyers L. Lavina Hamman, RN, BSN, [place] Coordinator Office number [phone]

## 2022-06-25 ENCOUNTER — Telehealth: Payer: Self-pay | Admitting: *Deleted

## 2022-06-25 NOTE — Progress Notes (Unsigned)
  Care Coordination Note  06/25/2022 Name: IVO ACKERS MRN: [id number] DOB: 07/15/1945  CAMERRON GARNIER is a 77 y.o. year old male who is a primary care patient of Coral Spikes, DO and is actively engaged with the care management team. I reached out to [name] by phone today to assist with re-scheduling a follow up visit with the RN Case Manager  Follow up plan: Unsuccessful telephone outreach attempt made. A HIPAA compliant phone message was left for the patient providing contact information and requesting a return call.   [place]  Direct Dial: [phone]

## 2022-06-28 NOTE — Progress Notes (Signed)
  Care Coordination Note  06/28/2022 Name: [name] MRN: [id number] DOB: Jun 26, 1945  [name] is a 77 y.o. year old male who is a primary care patient of Coral Spikes, DO and is actively engaged with the care management team. I reached out to [name] by phone today to assist with re-scheduling a follow up visit with the RN Case Manager  Follow up plan: Telephone appointment with care management team member scheduled for:07/10/22  [place]: [phone]

## 2022-07-10 ENCOUNTER — Ambulatory Visit: Payer: Self-pay | Admitting: *Deleted

## 2022-07-10 NOTE — Patient Outreach (Signed)
  Care Coordination   Follow Up Visit Note   07/10/2022 Name: [name] MRN: [id number] DOB: 08-02-1945  [name] is a 77 y.o. year old male who sees Paul Spikes, DO for primary care. I spoke with  [name] and his wife [name] by phone today.  What matters to the patients health and wellness today?  pending medicare AWV, letter mailed on 06/25/22, missing medicines from [employer] pending, hard of hearing with poor use of hearing aids (does not know where they are at,did not wear much) Spoke with wife when couldn't hear 08/02/22 GI appt     Goals Addressed               This Visit's Progress     Patient Stated     management of diabetes ([facility]) (pt-stated)        Care Coordination Interventions: Discussed plans with patient for ongoing care management follow up and provided patient with direct contact information for care management team Encouragement provided         Other     health screening [facility])        Interventions Today    Flowsheet Row Most Recent Value  Chronic Disease   Chronic disease during today's visit Other  [pending medicare AWV, letter mailed on 06/25/22, missing medicines from [employer] pending, hard of hearing with poor use of hearing aids (does not know where they are at,did not wear much) Spoke with wife when couldn't hear 08/02/22 GI appt]  General Interventions   General Interventions Discussed/Reviewed Doctor Visits, General Interventions Reviewed, Durable Medical Equipment (DME)  [Encouraged scheduling of AWV, assessed for any worsening symptoms,  06/25/22 AWV lettter was sent. Hard of hearing/hearing aid use, missing/pending medications from [employer]]  Doctor Visits Discussed/Reviewed PCP, Annual Wellness Visits, Specialist  [pcp, gastroenterology 08/02/22 office visit]  Education Interventions   Education Provided Provided Education  [community/pcp pharmacy assist]  Provided Verbal  Education On [employer]              SDOH assessments and interventions completed:  No     Care Coordination Interventions:  Yes, provided   Follow up plan: Follow up call scheduled for 08/28/22    Encounter Outcome:  Pt. Visit Completed   Greig Altergott L. Lavina Hamman, RN, BSN, [place] Coordinator Office number [phone]

## 2022-07-10 NOTE — Patient Instructions (Signed)
Visit Information  Thank you for taking time to visit with me today. Please don't hesitate to contact me if I can be of assistance to you.   Following are the goals we discussed today:   Goals Addressed               This Visit's Progress     Patient Stated     management of diabetes ([facility]) (pt-stated)        Care Coordination Interventions: Discussed plans with patient for ongoing care management follow up and provided patient with direct contact information for care management team Encouragement provided         Other     health screening [facility])        Interventions Today    Flowsheet Row Most Recent Value  Chronic Disease   Chronic disease during today's visit Other  [pending medicare AWV, letter mailed on 06/25/22, missing medicines from [employer] pending, hard of hearing with poor use of hearing aids (does not know where they are at,did not wear much) Spoke with wife when couldn't hear 08/02/22 GI appt]  General Interventions   General Interventions Discussed/Reviewed Doctor Visits, General Interventions Reviewed, Durable Medical Equipment (DME)  [Encouraged scheduling of AWV, assessed for any worsening symptoms,  06/25/22 AWV lettter was sent. Hard of hearing/hearing aid use, missing/pending medications from [employer]]  Doctor Visits Discussed/Reviewed PCP, Annual Wellness Visits, Specialist  [pcp, gastroenterology 08/02/22 office visit]  Education Interventions   Education Provided Provided Education  [community/pcp pharmacy assist]  Provided Verbal Education On [employer]              Our next appointment is by telephone on 08/28/22 at 1 pm  Please call the care guide team at [phone] if you need to cancel or reschedule your appointment.   If you are experiencing a Mental Health or [place] or need someone to talk to, please call the Suicide and Crisis Lifeline: 988 call the [place] National Suicide Prevention  Lifeline: 401-279-4060 or TTY: 2052228198 TTY 206 625 5976) to talk to a trained counselor call 1-800-273-TALK (toll free, 24 hour hotline) call the [facility]: ([phone] call 911   The patient verbalized understanding of instructions, educational materials, and care plan provided today and DECLINED offer to receive copy of patient instructions, educational materials, and care plan.   The patient has been provided with contact information for the care management team and has been advised to call with any health related questions or concerns.   Gelene Recktenwald L. Lavina Hamman, RN, BSN, [place] Coordinator Office number [phone]

## 2022-07-26 DIAGNOSIS — I5030 Unspecified diastolic (congestive) heart failure: Secondary | ICD-10-CM | POA: Diagnosis not present

## 2022-07-26 DIAGNOSIS — N189 Chronic kidney disease, unspecified: Secondary | ICD-10-CM | POA: Diagnosis not present

## 2022-07-26 DIAGNOSIS — E1122 Type 2 diabetes mellitus with diabetic chronic kidney disease: Secondary | ICD-10-CM | POA: Diagnosis not present

## 2022-07-26 DIAGNOSIS — N2889 Other specified disorders of kidney and ureter: Secondary | ICD-10-CM | POA: Diagnosis not present

## 2022-07-26 DIAGNOSIS — E1129 Type 2 diabetes mellitus with other diabetic kidney complication: Secondary | ICD-10-CM | POA: Diagnosis not present

## 2022-07-26 DIAGNOSIS — K7460 Unspecified cirrhosis of liver: Secondary | ICD-10-CM | POA: Diagnosis not present

## 2022-07-26 DIAGNOSIS — R809 Proteinuria, unspecified: Secondary | ICD-10-CM | POA: Diagnosis not present

## 2022-07-26 DIAGNOSIS — D638 Anemia in other chronic diseases classified elsewhere: Secondary | ICD-10-CM | POA: Diagnosis not present

## 2022-07-26 DIAGNOSIS — N1832 Chronic kidney disease, stage 3b: Secondary | ICD-10-CM | POA: Diagnosis not present

## 2022-07-26 DIAGNOSIS — N2581 Secondary hyperparathyroidism of renal origin: Secondary | ICD-10-CM | POA: Diagnosis not present

## 2022-07-26 DIAGNOSIS — E1151 Type 2 diabetes mellitus with diabetic peripheral angiopathy without gangrene: Secondary | ICD-10-CM | POA: Diagnosis not present

## 2022-07-26 DIAGNOSIS — I110 Hypertensive heart disease with heart failure: Secondary | ICD-10-CM | POA: Diagnosis not present

## 2022-08-02 ENCOUNTER — Ambulatory Visit (INDEPENDENT_AMBULATORY_CARE_PROVIDER_SITE_OTHER): Payer: Medicare HMO | Admitting: Gastroenterology

## 2022-08-27 ENCOUNTER — Encounter: Payer: Medicare HMO | Admitting: Family Medicine

## 2022-08-28 ENCOUNTER — Ambulatory Visit: Payer: Self-pay | Admitting: *Deleted

## 2022-08-28 NOTE — Patient Outreach (Signed)
Care Coordination   Follow Up Visit Note   08/28/2022 Name: [name] MRN: [id number] DOB: [birth year]  [name] is a 77 y.o. year old male who sees Tommie Sams, DO for primary care. I spoke with  wife, [name] for [name] by phone today.  What matters to the patients health and wellness today?  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 small solid renal cell carcinoma - Hx of prostate cancer  Nephrology plans to reassess at a short interval-at around 6 months Wife confirms he has lots of medicines and takes them as prescribed Patient had not been weighed at home daily  Wife states she will weigh patient on 08/29/22 and let RN CM know if he is in the 170's + Wife voiced understanding of the importance of weighing and monitoring for edema/swelling, physical changes daily   Diabetes cbg below 200 per wife HgA1c on 12/09/21 of 11.3 Wife voiced  understanding of signs and symptoms of elevated or low cbg values  Chronic Kidney disease (CKD) Patient and wife decided not to take recommended medicine, sodium bicarb from Dr Wolfgang [place] during the 07/26/22 office visit. "Felt it may harm him" and he "takes too many right now"   Noted cancelled pcp and gastroenterology appointments- Wife stated the patient "said he did not feel like going to them" She states she will rescheduled appointments      Goals Addressed               This Visit's Progress     Patient Stated     COMPLETED: management of diabetes ([facility]) (pt-stated)   Not on track     Care Coordination Interventions:duplicate goal closure      Other     COMPLETED: health screening [facility])   Not on track     Duplicate goal  closure      [facility] care coordination   Not on track      Interventions Today    Flowsheet Row Most Recent Value  Chronic Disease   Chronic disease during today's visit Diabetes, Chronic Kidney Disease/End Stage Renal Disease (ESRD), Other  General Interventions   General Interventions Discussed/Reviewed General Interventions Discussed, General Interventions Reviewed, Labs, Durable Medical Equipment (DME), Doctor Visits  Labs Hgb A1c every 6 months  Doctor Visits Discussed/Reviewed Doctor Visits Reviewed, Doctor Visits Discussed, PCP, Specialist  Durable Medical Equipment (DME) Glucomoter  PCP/Specialist Visits Compliance with follow-up visit  [Discussed the importance of rescheduling the cancelled pcp and gastroenterology appointments]  Exercise Interventions   Exercise Discussed/Reviewed Exercise Reviewed, Physical Activity  Physical Activity Discussed/Reviewed Physical Activity Reviewed  Education Interventions   Education Provided Provided Education  Provided Verbal Education On Labs, Blood Sugar Monitoring, Medication, When to see the doctor  Labs Reviewed Hgb A1c, Kidney Function  Mental Health Interventions   Mental Health Discussed/Reviewed Mental Health Reviewed, Coping Strategies  Nutrition Interventions   Nutrition Discussed/Reviewed Nutrition Reviewed, Fluid intake  Pharmacy Interventions   Pharmacy Dicussed/Reviewed Pharmacy Topics Discussed, Affording Medications  Safety Interventions   Safety Discussed/Reviewed Safety Discussed, Fall Risk              SDOH assessments and interventions completed:  No     Care Coordination Interventions:  Yes, provided   Follow up plan: Follow up call scheduled for 09/11/22    Encounter Outcome:  Pt. Visit Completed   Nicolaos Mitrano L. Noelle Penner, RN, BSN, CCM [facility] Care Management Community Coordinator Office number [phone]

## 2022-08-28 NOTE — Patient Instructions (Addendum)
Visit Information  Thank you for taking time to visit with me today. Please don't hesitate to contact me if I can be of assistance to you.   Following are the goals we discussed today:   Goals Addressed               This Visit's Progress     Patient Stated     COMPLETED: management of diabetes ([facility]) (pt-stated)   Not on track     Care Coordination Interventions:duplicate goal closure      Other     COMPLETED: health screening [facility]   Not on track     Duplicate goal closure      [facility] care coordination   Not on track      Interventions Today    Flowsheet Row Most Recent Value  Chronic Disease   Chronic disease during today's visit Diabetes, Chronic Kidney Disease/End Stage Renal Disease (ESRD), Other  General Interventions   General Interventions Discussed/Reviewed General Interventions Discussed, General Interventions Reviewed, Labs, Durable Medical Equipment (DME), Doctor Visits  Labs Hgb A1c every 6 months  Doctor Visits Discussed/Reviewed Doctor Visits Reviewed, Doctor Visits Discussed, PCP, Specialist  Durable Medical Equipment (DME) Glucomoter  PCP/Specialist Visits Compliance with follow-up visit  [Discussed the importance of rescheduling the cancelled pcp and gastroenterology appointments]  Exercise Interventions   Exercise Discussed/Reviewed Exercise Reviewed, Physical Activity  Physical Activity Discussed/Reviewed Physical Activity Reviewed  Education Interventions   Education Provided Provided Education  Provided Verbal Education On Labs, Blood Sugar Monitoring, Medication, When to see the doctor  Labs Reviewed Hgb A1c, Kidney Function  Mental Health Interventions   Mental Health Discussed/Reviewed Mental Health Reviewed, Coping Strategies  Nutrition Interventions   Nutrition Discussed/Reviewed Nutrition Reviewed, Fluid intake  Pharmacy Interventions   Pharmacy Dicussed/Reviewed Pharmacy Topics Discussed, Affording Medications  Safety Interventions   Safety  Discussed/Reviewed Safety Discussed, Fall Risk              Our next appointment is by telephone on 09/11/22 at 1 pm  Please call the care guide team at [phone] if you need to cancel or reschedule your appointment.   If you are experiencing a Mental Health or Behavioral Health Crisis or need someone to talk to, please call the Suicide and Crisis Lifeline: 988 call the [place] National Suicide Prevention Lifeline: 616-700-3563 or TTY: 332-355-4325 TTY (309) 312-5529) to talk to a trained counselor call 1-800-273-TALK (toll free, 24 hour hotline) call the [facility]: [phone] call 911   The patient verbalized understanding of instructions, educational materials, and care plan provided today and DECLINED offer to receive copy of patient instructions, educational materials, and care plan.   The patient has been provided with contact information for the care management team and has been advised to call with any health related questions or concerns.   Dreon Pineda L. Noelle Penner, RN, BSN, CCM [facility] Care Management Community Coordinator Office number [phone]

## 2022-09-11 ENCOUNTER — Ambulatory Visit: Payer: Self-pay | Admitting: *Deleted

## 2022-09-11 NOTE — Patient Outreach (Signed)
  Care Coordination   Follow Up Visit Note   09/12/2022 Name: YIGIT DELGRANDE MRN: [id number] DOB: 1945/05/10  Chrissie Noa Greeley is a 77 y.o. year old male who sees Tommie Sams, DO for primary care. I spoke with  Joyce Gross, spouse of ELRICO GERRICK by phone today.  What matters to the patients health and wellness today?  Upset stomach no matter what he eats and wakes up always not feeling good Diabetes Last HgA1c= 11.3  She discussed Mr Duayne is taking metformin, stays bloat with and extended abdomen Patient caregiver Joyce Gross discusses her loss of interest in doing things after the death of her sisters She agreed to follow up with some resources offered   Goals Addressed             This Visit's Progress    [facility] care coordination   Not on track    Interventions Today    Flowsheet Row Most Recent Value  Chronic Disease   Chronic disease during today's visit Other  [continuous "upset stomach" no matter what he eats, ? metformin side effects, colitis/crohn symptoms]  General Interventions   General Interventions Discussed/Reviewed Communication with  Labs Hgb A1c every 6 months  [A1c remains elevated 11.3]  Doctor Visits Discussed/Reviewed Doctor Visits Reviewed, PCP  PCP/Specialist Visits Compliance with follow-up visit  Communication with PCP/Specialists  [notes to MDs for update]  Exercise Interventions   Exercise Discussed/Reviewed Exercise Reviewed, Weight Managment  Physical Activity Discussed/Reviewed Physical Activity Reviewed  Weight Management Weight loss  Education Interventions   Education Provided --  [metformin side effects, colitis/crohn symptoms, monitoring of CBGs]  Provided Verbal Education On Nutrition, Blood Sugar Monitoring, Medication  Labs Reviewed Hgb A1c  Mental Health Interventions   Mental Health Discussed/Reviewed --  [caregiver denies need of SW referral but agreed to follow up on mental health medication resource ([facility]) caregiver  support provided]  Nutrition Interventions   Nutrition Discussed/Reviewed Nutrition Reviewed, Portion sizes, Decreasing sugar intake, Carbohydrate meal planning  Pharmacy Interventions   Pharmacy Dicussed/Reviewed Pharmacy Topics Reviewed, Medications and their functions             SDOH assessments and interventions completed:  No     Care Coordination Interventions:  Yes, provided   Follow up plan: Follow up call scheduled for 10/12/22    Encounter Outcome:  Pt. Visit Completed   Lynnette Pote L. Noelle Penner, RN, BSN, CCM [facility] Care Management Community Coordinator Office number [phone]

## 2022-09-12 NOTE — Patient Instructions (Addendum)
Visit Information  Thank you for taking time to visit with me today. Please don't hesitate to contact me if I can be of assistance to you.   Following are the goals we discussed today:   Goals Addressed             This Visit's Progress    [facility] care coordination   Not on track    Interventions Today    Flowsheet Row Most Recent Value  Chronic Disease   Chronic disease during today's visit Other  [continuous "upset stomach" no matter what he eats, ? metformin side effects, colitis/crohn symptoms]  General Interventions   General Interventions Discussed/Reviewed Communication with  Labs Hgb A1c every 6 months  [A1c remains elevated 11.3]  Doctor Visits Discussed/Reviewed Doctor Visits Reviewed, PCP  PCP/Specialist Visits Compliance with follow-up visit  Communication with PCP/Specialists  [notes to MDs for update]  Exercise Interventions   Exercise Discussed/Reviewed Exercise Reviewed, Weight Managment  Physical Activity Discussed/Reviewed Physical Activity Reviewed  Weight Management Weight loss  Education Interventions   Education Provided --  [metformin side effects, colitis/crohn symptoms, monitoring of CBGs]  Provided Verbal Education On Nutrition, Blood Sugar Monitoring, Medication  Labs Reviewed Hgb A1c  Mental Health Interventions   Mental Health Discussed/Reviewed --  [caregiver denies need of SW referral but agreed to follow up on mental health medication resource ([facility]) caregiver support provided]  Nutrition Interventions   Nutrition Discussed/Reviewed Nutrition Reviewed, Portion sizes, Decreasing sugar intake, Carbohydrate meal planning  Pharmacy Interventions   Pharmacy Dicussed/Reviewed Pharmacy Topics Reviewed, Medications and their functions             Our next appointment is by telephone on 10/12/22 at 1 pm  Please call the care guide team at [phone] if you need to cancel or reschedule your appointment.   If you are experiencing a Mental  Health or Behavioral Health Crisis or need someone to talk to, please call the Suicide and Crisis Lifeline: 988 call the [place] National Suicide Prevention Lifeline: 916 761 4114 or TTY: 4500020533 TTY (385) 735-8565) to talk to a trained counselor call 1-800-273-TALK (toll free, 24 hour hotline) call the [facility]: [phone] call 911   The patient verbalized understanding of instructions, educational materials, and care plan provided today and DECLINED offer to receive copy of patient instructions, educational materials, and care plan.   The patient has been provided with contact information for the care management team and has been advised to call with any health related questions or concerns.   Watt Geiler L. Noelle Penner, RN, BSN, CCM [facility] Care Management Community Coordinator Office number [phone]

## 2022-09-13 ENCOUNTER — Ambulatory Visit: Payer: Medicare HMO | Admitting: Family Medicine

## 2022-09-20 ENCOUNTER — Ambulatory Visit: Payer: Medicare HMO | Admitting: Family Medicine

## 2022-09-27 ENCOUNTER — Ambulatory Visit (INDEPENDENT_AMBULATORY_CARE_PROVIDER_SITE_OTHER): Payer: Medicare HMO | Admitting: Family Medicine

## 2022-09-27 VITALS — BP 172/78 | Ht 71.0 in | Wt 164.4 lb

## 2022-09-27 DIAGNOSIS — F39 Unspecified mood [affective] disorder: Secondary | ICD-10-CM | POA: Diagnosis not present

## 2022-09-27 MED ORDER — RISPERIDONE 1 MG PO TABS: 0.5000 mg | ORAL_TABLET | Freq: Two times a day (BID) | ORAL | 1 refills | Status: DC

## 2022-09-27 NOTE — Patient Instructions (Signed)
I have increased the Risperdal.   Follow up in 6 weeks.  Take care  Dr. Adriana Simas

## 2022-09-30 NOTE — Assessment & Plan Note (Signed)
Uncontrolled, worsening.  Patient exhibiting severe depression.  Also having visual hallucinations.  I am placing another referral to psychiatry.  Increasing Risperdal.

## 2022-09-30 NOTE — Progress Notes (Signed)
Subjective:  Patient ID: [name], male    DOB: [birth year]  Age: 77 y.o. MRN: [id number]  CC: Chief Complaint  Patient presents with   Follow-up    No desire to do anything     HPI:  77 year old male with an extensive past medical history presents for follow-up.  Before seeing me, patient was seeing psychiatry at the [place].  He has not seen them in quite some time.  He was not happy with his care.  Patient states that he has little interest in doing things.  He feels down.  Symptoms are severe.  Likely has a component of dementia as he was placed on Aricept by the [place].  He also has a mood disorder.  It is unclear what the formal diagnosis is but he is on several medications.  He is currently taking Risperdal, sertraline, and mirtazapine in addition to the Aricept.  Patient's wife is present today as well.  Patient states that he has visual hallucinations quite often.  He knows that these are not real.  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   Type 2 diabetes mellitus (HCC) 06/02/2007   Hypercholesterolemia 06/02/2007   Essential hypertension 06/02/2007    Social Hx   Social History   Socioeconomic History   Marital status: Married    Spouse name: Not on file   Number of children: Not on file   Years of education: 1   Highest education level: Not on file  Occupational History   Occupation: Curator  Tobacco Use   Smoking status: Former    Types: Cigarettes    Quit date: 02/19/2018    Years since quitting: 4.6    Smokeless tobacco: Never  Vaping Use   Vaping Use: Never used  Substance and Sexual Activity   Alcohol use: No   Drug use: No   Sexual activity: Not on file  Other Topics Concern   Not on file  Social History Narrative   Not on file   Social Determinants of Health   Financial Resource Strain: Low Risk  (01/24/2022)   Overall Financial Resource Strain (CARDIA)    Difficulty of Paying Living Expenses: Not hard at all  Food Insecurity: No Food Insecurity (01/24/2022)   Hunger Vital Sign    Worried About Running Out of Food in the Last Year: Never true    Ran Out of Food in the Last Year: Never true  Transportation Needs: No Transportation Needs (01/24/2022)   PRAPARE - Administrator, Civil Service (Medical): No    Lack of Transportation (Non-Medical): No  Physical Activity: Not on file  Stress: No Stress Concern Present (01/24/2022)   [employer] of Occupational Health - Occupational Stress Questionnaire    Feeling of Stress : Only a little  Social Connections: Socially Isolated (01/26/2022)   Social Connection and Isolation Panel [NHANES]    Frequency of Communication with Friends and Family: Once a week    Frequency of Social Gatherings with Friends and Family: Once a week    Attends Religious Services: Never  Active Member of Clubs or Organizations: No    Attends Banker Meetings: Never    Marital Status: Married    Review of Systems Per HPI  Objective:  BP (!) 172/78   Ht 5\' 11"  (1.803 m)   Wt 164 lb 6.4 oz (74.6 kg)   BMI 22.93 kg/m      09/27/2022    3:33 PM 09/27/2022    3:32 PM 02/26/2022    2:39 PM  BP/Weight  Systolic BP 172 165 152  Diastolic BP 78 75 65  Wt. (Lbs)  164.4   BMI  22.93 kg/m2     Physical Exam Vitals and nursing note reviewed.  Constitutional:      General: He is not in acute distress.    Appearance: Normal appearance.  HENT:     Head: Normocephalic and atraumatic.  Cardiovascular:     Rate and  Rhythm: Normal rate and regular rhythm.  Pulmonary:     Effort: Pulmonary effort is normal.     Breath sounds: Normal breath sounds.  Neurological:     Mental Status: He is alert.  Psychiatric:        Behavior: Behavior normal.     Comments: Depressed mood.     Lab Results  Component Value Date   WBC 9.6 01/22/2022   HGB 11.4 (L) 01/22/2022   HCT 35.0 (L) 01/22/2022   PLT 228 01/22/2022   GLUCOSE 86 01/22/2022   CHOL 163 11/16/2021   TRIG 145 11/16/2021   HDL 47 11/16/2021   LDLCALC 91 11/16/2021   ALT 25 01/22/2022   AST 24 01/22/2022   NA 141 01/22/2022   K 4.1 01/22/2022   CL 106 01/22/2022   CREATININE 1.73 (H) 01/22/2022   BUN 32 (H) 01/22/2022   CO2 20 01/22/2022   TSH 1.707 03/28/2018   INR 1.76 04/01/2018   HGBA1C 11.3 (H) 01/09/2022     Assessment & Plan:   Problem List Items Addressed This Visit       Other   Mood disorder (HCC) - Primary    Uncontrolled, worsening.  Patient exhibiting severe depression.  Also having visual hallucinations.  I am placing another referral to psychiatry.  Increasing Risperdal.      Relevant Orders   Ambulatory referral to Psychiatry    Meds ordered this encounter  Medications   risperiDONE (RISPERDAL) 1 MG tablet    Sig: Take 0.5 tablets (0.5 mg total) by mouth 2 (two) times daily.    Dispense:  180 tablet    Refill:  1    Follow-up:  Return in about 6 weeks (around 11/08/2022).  Everlene Other DO [facility]

## 2022-10-12 ENCOUNTER — Ambulatory Visit: Payer: Self-pay | Admitting: *Deleted

## 2022-10-12 NOTE — Patient Outreach (Addendum)
  Care Coordination   Follow Up Visit Note   10/12/2022 Name: [name] MRN: [id number] DOB: 10/17/1945  [name] is a 77 y.o. year old male who sees Tommie Sams, DO for primary care. I spoke with  [name] by phone today.  What matters to the patients health and wellness today? "Doing pretty good" He is going out to stores with his wife Upset stomach varies  New issue with noted Cbg value drops in the mornings to 70's Today he had 2 cookies after getting up then had his breakfast and his cbg was 156, at lunch it was 153 Discussed voiced understanding of night snacks with his insulin Caregiver wife, [name] is doing well She denies concerns     Goals Addressed             This Visit's Progress    [facility] care coordination   Not on track    Interventions Today    Flowsheet Row Most Recent Value  Chronic Disease   Chronic disease during today's visit Diabetes, Other  [abdominal symptoms]  General Interventions   General Interventions Discussed/Reviewed General Interventions Reviewed, Doctor Visits, Sick Day Rules  Doctor Visits Discussed/Reviewed Doctor Visits Reviewed, PCP, Specialist  PCP/Specialist Visits Compliance with follow-up visit  Education Interventions   Education Provided Provided Education, Provided Printed Education  [diabetic night snack, diet]  Provided Verbal Education On --  [adv dirctives, diabetes hyperglycemia, hypgblycemia]  Mental Health Interventions   Mental Health Discussed/Reviewed Mental Health Reviewed, Coping Strategies  Nutrition Interventions   Nutrition Discussed/Reviewed Nutrition Reviewed, Increasing proteins, Decreasing sugar intake, Supplemental nutrition, Carbohydrate meal planning  Pharmacy Interventions   Pharmacy Dicussed/Reviewed Pharmacy Topics Reviewed, Affording Medications  Advanced Directive Interventions   Advanced Directives Discussed/Reviewed Provided resource for acquiring and  filling out documents, Advanced Directives Discussed             SDOH assessments and interventions completed:  No     Care Coordination Interventions:  Yes, provided   Follow up plan: Follow up call scheduled for 11/13/22    Encounter Outcome:  Pt. Visit Completed   Paul Hoaglin L. Noelle Penner, RN, BSN, CCM [facility] Care Management Community Coordinator Office number [phone]

## 2022-10-12 NOTE — Patient Instructions (Addendum)
Visit Information  Thank you for taking time to visit with me today. Please don't hesitate to contact me if I can be of assistance to you.   Following are the goals we discussed today:   Goals Addressed             This Visit's Progress    [facility] care coordination   Not on track    Interventions Today    Flowsheet Row Most Recent Value  Chronic Disease   Chronic disease during today's visit Diabetes, Other  [abdominal symptoms]  General Interventions   General Interventions Discussed/Reviewed General Interventions Reviewed, Doctor Visits, Sick Day Rules  Doctor Visits Discussed/Reviewed Doctor Visits Reviewed, PCP, Specialist  PCP/Specialist Visits Compliance with follow-up visit  Education Interventions   Education Provided Provided Education, Provided Printed Education  [diabetic night snack, diet]  Provided Verbal Education On --  [adv dirctives, diabetes hyperglycemia, hypgblycemia]  Mental Health Interventions   Mental Health Discussed/Reviewed Mental Health Reviewed, Coping Strategies  Nutrition Interventions   Nutrition Discussed/Reviewed Nutrition Reviewed, Increasing proteins, Decreasing sugar intake, Supplemental nutrition, Carbohydrate meal planning  Pharmacy Interventions   Pharmacy Dicussed/Reviewed Pharmacy Topics Reviewed, Affording Medications  Advanced Directive Interventions   Advanced Directives Discussed/Reviewed Provided resource for acquiring and filling out documents, Advanced Directives Discussed             Our next appointment is by telephone on 11/13/22 at 2:30 pm  Please call the care guide team at [phone] if you need to cancel or reschedule your appointment.   If you are experiencing a Mental Health or Behavioral Health Crisis or need someone to talk to, please call the Suicide and Crisis Lifeline: 988 call the [place] National Suicide Prevention Lifeline: 531-722-5677 or TTY: (934)444-3703 TTY 479-160-3281) to talk to a trained  counselor call 1-800-273-TALK (toll free, 24 hour hotline) call the [facility]: [phone] call 911   The patient verbalized understanding of instructions, educational materials, and care plan provided today and DECLINED offer to receive copy of patient instructions, educational materials, and care plan.   The patient has been provided with contact information for the care management team and has been advised to call with any health related questions or concerns.   Tova Vater L. Noelle Penner, RN, BSN, CCM [facility] Care Management Community Coordinator Office number [phone]

## 2022-11-06 DIAGNOSIS — M79673 Pain in unspecified foot: Secondary | ICD-10-CM | POA: Diagnosis not present

## 2022-11-06 DIAGNOSIS — L84 Corns and callosities: Secondary | ICD-10-CM | POA: Diagnosis not present

## 2022-11-06 DIAGNOSIS — L603 Nail dystrophy: Secondary | ICD-10-CM | POA: Diagnosis not present

## 2022-11-06 DIAGNOSIS — E1142 Type 2 diabetes mellitus with diabetic polyneuropathy: Secondary | ICD-10-CM | POA: Diagnosis not present

## 2022-11-09 ENCOUNTER — Ambulatory Visit (INDEPENDENT_AMBULATORY_CARE_PROVIDER_SITE_OTHER): Payer: Medicare HMO | Admitting: Family Medicine

## 2022-11-09 ENCOUNTER — Encounter: Payer: Self-pay | Admitting: Family Medicine

## 2022-11-09 VITALS — BP 91/58 | HR 61 | Temp 97.2°F | Ht 71.0 in | Wt 173.0 lb

## 2022-11-09 DIAGNOSIS — I10 Essential (primary) hypertension: Secondary | ICD-10-CM | POA: Diagnosis not present

## 2022-11-09 DIAGNOSIS — E1151 Type 2 diabetes mellitus with diabetic peripheral angiopathy without gangrene: Secondary | ICD-10-CM

## 2022-11-09 DIAGNOSIS — E7800 Pure hypercholesterolemia, unspecified: Secondary | ICD-10-CM | POA: Diagnosis not present

## 2022-11-09 DIAGNOSIS — F39 Unspecified mood [affective] disorder: Secondary | ICD-10-CM

## 2022-11-09 DIAGNOSIS — N1832 Chronic kidney disease, stage 3b: Secondary | ICD-10-CM | POA: Diagnosis not present

## 2022-11-09 MED ORDER — DEXCOM G7 SENSOR MISC: 1 refills | Status: DC

## 2022-11-09 MED ORDER — DEXCOM G7 RECEIVER DEVI: 0 refills | Status: DC

## 2022-11-09 NOTE — Patient Instructions (Signed)
Labs today.  Follow up in 1 month.  Take care  Dr. Lacinda Axon

## 2022-11-10 LAB — HEMOGLOBIN A1C
Est. average glucose Bld gHb Est-mCnc: 126 mg/dL
Hgb A1c MFr Bld: 6 % — ABNORMAL HIGH (ref 4.8–5.6)

## 2022-11-10 LAB — MICROALBUMIN / CREATININE URINE RATIO
Creatinine, Urine: 124 mg/dL
Microalb/Creat Ratio: 104 mg/g creat — ABNORMAL HIGH (ref 0–29)
Microalbumin, Urine: 128.8 ug/mL

## 2022-11-10 LAB — CMP14+EGFR
ALT: 12 IU/L (ref 0–44)
AST: 15 IU/L (ref 0–40)
Albumin: 4.1 g/dL (ref 3.8–4.8)
Alkaline Phosphatase: 120 IU/L (ref 44–121)
BUN/Creatinine Ratio: 21 (ref 10–24)
BUN: 46 mg/dL — ABNORMAL HIGH (ref 8–27)
Bilirubin Total: <0.2 mg/dL (ref 0.0–1.2)
CO2: 19 mmol/L — ABNORMAL LOW (ref 20–29)
Calcium: 9.2 mg/dL (ref 8.6–10.2)
Chloride: 108 mmol/L — ABNORMAL HIGH (ref 96–106)
Creatinine, Ser: 2.23 mg/dL — ABNORMAL HIGH (ref 0.76–1.27)
Globulin, Total: 2.7 g/dL (ref 1.5–4.5)
Glucose: 128 mg/dL — ABNORMAL HIGH (ref 70–99)
Potassium: 4.8 mmol/L (ref 3.5–5.2)
Sodium: 142 mmol/L (ref 134–144)
Total Protein: 6.8 g/dL (ref 6.0–8.5)
eGFR: 30 mL/min/{1.73_m2} — ABNORMAL LOW (ref >59)

## 2022-11-10 LAB — CBC
Hematocrit: 34.2 % — ABNORMAL LOW (ref 37.5–51.0)
Hemoglobin: 11.2 g/dL — ABNORMAL LOW (ref 13.0–17.7)
MCH: 31.4 pg (ref 26.6–33.0)
MCHC: 32.7 g/dL (ref 31.5–35.7)
MCV: 96 fL (ref 79–97)
Platelets: 198 10*3/uL (ref 150–450)
RBC: 3.57 x10E6/uL — ABNORMAL LOW (ref 4.14–5.80)
RDW: 12.7 % (ref 11.6–15.4)
WBC: 10.7 10*3/uL (ref 3.4–10.8)

## 2022-11-10 LAB — LIPID PANEL
Chol/HDL Ratio: 2.8 ratio (ref 0.0–5.0)
Cholesterol, Total: 152 mg/dL (ref 100–199)
HDL: 54 mg/dL (ref >39)
LDL Chol Calc (NIH): 85 mg/dL (ref 0–99)
Triglycerides: 64 mg/dL (ref 0–149)
VLDL Cholesterol Cal: 13 mg/dL (ref 5–40)

## 2022-11-12 NOTE — Progress Notes (Signed)
Subjective:  Patient ID: [name], male    DOB: [birth year]  Age: 77 y.o. MRN: [id number]  CC: Chief Complaint  Patient presents with   Diabetes    Requesting CGM to monitor blood sugars better ranging 235 to 65    Hypertension    Follow up, seeing kidney doctor for CKD    HPI:  77 year old male with an extensive past medical history presents for follow-up.  Patient continues to have issues with his mood.  He states that his hallucinations are less but he still has little motivation to do anything.  He is on risperidone, mirtazapine, and Zoloft.  He is also on Aricept.  This was all started at the [place].  I have tried to get him to see a psychiatrist but we have been unsuccessful thus far.  BP on the low side here today.  He is currently only on propranolol.  Once again this was done by the [place].  He is A1c today to assess.  He is currently on insulin therapy only.  Needs lipid panel to assess control of lipids given his significant past medical history.  He is currently on Crestor.  No reports of adverse side effects.  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   Type 2 diabetes mellitus (HCC) 06/02/2007   Hypercholesterolemia 06/02/2007   Essential hypertension 06/02/2007    Social Hx   Social History   Socioeconomic History   Marital status: Married    Spouse name: Not on file   Number of children: Not on file   Years of education: 1   Highest education level: Not on file  Occupational History   Occupation:  Curator  Tobacco Use   Smoking status: Former    Current packs/day: 0.00    Types: Cigarettes    Quit date: 02/19/2018    Years since quitting: 4.7   Smokeless tobacco: Never  Vaping Use   Vaping status: Never Used  Substance and Sexual Activity   Alcohol use: No   Drug use: No   Sexual activity: Not on file  Other Topics Concern   Not on file  Social History Narrative   Not on file   Social Determinants of Health   Financial Resource Strain: Low Risk  (01/24/2022)   Overall Financial Resource Strain (CARDIA)    Difficulty of Paying Living Expenses: Not hard at all  Food Insecurity: No Food Insecurity (01/24/2022)   Hunger Vital Sign    Worried About Running Out of Food in the Last Year: Never true    Ran Out of Food in the Last Year: Never true  Transportation Needs: No Transportation Needs (01/24/2022)   PRAPARE - Administrator, Civil Service (Medical): No    Lack of Transportation (Non-Medical): No  Physical Activity: Not on file  Stress: No Stress Concern Present (01/24/2022)   [employer] of Occupational Health - Occupational Stress Questionnaire    Feeling of Stress : Only a little  Social Connections: Socially Isolated (01/26/2022)   Social Connection and Isolation Panel [NHANES]    Frequency  of Communication with Friends and Family: Once a week    Frequency of Social Gatherings with Friends and Family: Once a week    Attends Religious Services: Never    Diplomatic Services operational officer: No    Attends Engineer, structural: Never    Marital Status: Married    Review of Systems Per HPI  Objective:  BP (!) 91/58   Pulse 61   Temp (!) 97.2 F (36.2 C)   Ht 5\' 11"  (1.803 m)   Wt 173 lb (78.5 kg)   SpO2 97%   BMI 24.13 kg/m      11/09/2022   10:40 AM 09/27/2022    3:33 PM 09/27/2022    3:32 PM  BP/Weight  Systolic BP 91 172 165  Diastolic BP 58 78 75  Wt. (Lbs) 173  164.4  BMI 24.13 kg/m2  22.93 kg/m2    Physical  Exam Vitals and nursing note reviewed.  Constitutional:      General: He is not in acute distress.    Appearance: Normal appearance.  HENT:     Head: Normocephalic and atraumatic.  Cardiovascular:     Rate and Rhythm: Normal rate and regular rhythm.  Pulmonary:     Effort: Pulmonary effort is normal.     Breath sounds: Normal breath sounds.  Neurological:     Mental Status: He is alert.  Psychiatric:     Comments: Flat affect.  Depressed mood.     Lab Results  Component Value Date   WBC 10.7 11/09/2022   HGB 11.2 (L) 11/09/2022   HCT 34.2 (L) 11/09/2022   PLT 198 11/09/2022   GLUCOSE 128 (H) 11/09/2022   CHOL 152 11/09/2022   TRIG 64 11/09/2022   HDL 54 11/09/2022   LDLCALC 85 11/09/2022   ALT 12 11/09/2022   AST 15 11/09/2022   NA 142 11/09/2022   K 4.8 11/09/2022   CL 108 (H) 11/09/2022   CREATININE 2.23 (H) 11/09/2022   BUN 46 (H) 11/09/2022   CO2 19 (L) 11/09/2022   TSH 1.707 03/28/2018   INR 1.76 04/01/2018   HGBA1C 6.0 (H) 11/09/2022     Assessment & Plan:   Problem List Items Addressed This Visit       Cardiovascular and Mediastinum   Essential hypertension    BP on the low side today.  Will continue to monitor.        Endocrine   Type 2 diabetes mellitus (HCC) - Primary    A1c returned at 6.0.  Will decrease Lantus to 18 units.      Relevant Orders   Hemoglobin A1c (Completed)   Microalbumin / creatinine urine ratio (Completed)     Genitourinary   Stage 3b chronic kidney disease (HCC)    Labs returned revealing rising creatinine.  Needs close follow-up with nephrology.      Relevant Orders   CBC (Completed)   CMP14+EGFR (Completed)     Other   Hypercholesterolemia   Relevant Orders   Lipid panel (Completed)   Mood disorder (HCC)    Complex case.  Patient needs to see a psychiatrist.  We are working on this.       Meds ordered this encounter  Medications   Continuous Glucose Receiver (DEXCOM G7 RECEIVER) DEVI    Sig: Use as  directed to monitor blood glucose.    Dispense:  1 each    Refill:  0   Continuous Glucose Sensor (DEXCOM G7 SENSOR) MISC  Sig: Use as directed to monitor blood sugar. Change every 10 days.    Dispense:  3 each    Refill:  1    Follow-up:  Return in about 1 month (around 12/10/2022).  Everlene Other DO [facility]

## 2022-11-12 NOTE — Assessment & Plan Note (Addendum)
A1c returned at 6.0.  Will decrease Lantus to 18 units.

## 2022-11-12 NOTE — Assessment & Plan Note (Signed)
BP on the low side today.  Will continue to monitor.

## 2022-11-12 NOTE — Assessment & Plan Note (Signed)
Labs returned revealing rising creatinine.  Needs close follow-up with nephrology.

## 2022-11-12 NOTE — Assessment & Plan Note (Signed)
Complex case.  Patient needs to see a psychiatrist.  We are working on this.

## 2022-11-13 ENCOUNTER — Telehealth: Payer: Self-pay | Admitting: *Deleted

## 2022-11-13 ENCOUNTER — Telehealth: Payer: Self-pay | Admitting: Pharmacist

## 2022-11-13 ENCOUNTER — Ambulatory Visit: Payer: Self-pay | Admitting: *Deleted

## 2022-11-13 NOTE — Telephone Encounter (Signed)
Tommie Sams, DO  Please advise patient to see nephrology ASAP given labs. Also, needs decrease in insulin to 18 units.

## 2022-11-13 NOTE — Patient Instructions (Addendum)
Visit Information  Thank you for taking time to visit with me today. Please don't hesitate to contact me if I can be of assistance to you.   Following are the goals we discussed today:   Goals Addressed             This Visit's Progress    THN care coordination       Interventions Today    Flowsheet Row Most Recent Value  Chronic Disease   Chronic disease during today's visit Diabetes, Chronic Kidney Disease/End Stage Renal Disease (ESRD)  General Interventions   General Interventions Discussed/Reviewed General Interventions Reviewed, Durable Medical Equipment (DME), Doctor Visits, Labs  Labs Hgb A1c every 6 months  Doctor Visits Discussed/Reviewed Doctor Visits Reviewed, PCP, Specialist  PCP/Specialist Visits Compliance with follow-up visit  [reviewed recent pcp office visit to note encouragement to see someone for his mood]  Exercise Interventions   Exercise Discussed/Reviewed Exercise Reviewed, Weight Managment  Weight Management Weight maintenance  [his weights were reviewed and noted a gain of 9 lbs]  Education Interventions   Education Provided Provided Education  Provided Verbal Education On Mental Health/Coping with Illness, Community Resources  Mental Health Interventions   Mental Health Discussed/Reviewed Mental Health Reviewed, Coping Strategies             Our next appointment is by telephone on 12/11/22 at 3:15 pm  Please call the care guide team at [phone] if you need to cancel or reschedule your appointment.   If you are experiencing a Mental Health or Behavioral Health Crisis or need someone to talk to, please call the Suicide and Crisis Lifeline: 988 call the [place] National Suicide Prevention Lifeline: 365-195-7344 or TTY: 705-784-5536 TTY 431 872 7960) to talk to a trained counselor call 1-800-273-TALK (toll free, 24 hour hotline) call the [facility]: 743-526-4353 call 911   No preference to receive AVS, no internet  The  patient has been provided with contact information for the care management team and has been advised to call with any health related questions or concerns.   Markice Torbert L. Noelle Penner, RN, BSN, CCM [facility] Care Management Community Coordinator Office number [phone]

## 2022-11-13 NOTE — Telephone Encounter (Addendum)
Called and spoke with patient , and left a message for the wife to give [place] a call back to give her the details as well. Patient states has an appt with kidney doctor in 2 days on thursday

## 2022-11-13 NOTE — Telephone Encounter (Unsigned)
PA completed via cover my meds DX: E11.65 Patient is on insulin History of hypoglycemia

## 2022-11-13 NOTE — Patient Outreach (Signed)
  Care Coordination   Follow Up Visit Note   11/13/2022 Name: [name] MRN: [id number] DOB: January 25, 1946  [name] is a 77 y.o. year old male who sees Tommie Sams, DO for primary care. I spoke with  [name] by phone today.  What matters to the patients health and wellness today?  He informs RN CM he does "not like to talk on the phone and my wife is out caring for her brother" "I am just an old man" He requests to have his wife, [name] return a call to RN CM  He was seen by his pcp and counseling/psychiatry has been encouraged without success. Patient is prescribed various mood medications without much improvement. He is a veteran  He confirms he is aware of having increase kidney concerns and was encouraged to see his nephrologist. Pcp staff spoke with his wife today to confirm an upcoming nephrology visit scheduled on 11/15/22  Diabetes States his Doctor would like coordination related to the correct dose of insulin he is being administered.  His recent HgA1c was 6.0 He had some low cbg values in June 2024  His pcp recently lowered the Lantus dose to prevent morning cbg low values.He is noted to be pending a continuous glucose meter for a better monitoring of cbgs   Goals Addressed             This Visit's Progress    THN care coordination       Interventions Today    Flowsheet Row Most Recent Value  Chronic Disease   Chronic disease during today's visit Diabetes, Chronic Kidney Disease/End Stage Renal Disease (ESRD)  General Interventions   General Interventions Discussed/Reviewed General Interventions Reviewed, Durable Medical Equipment (DME), Doctor Visits, Labs  Labs Hgb A1c every 6 months  Doctor Visits Discussed/Reviewed Doctor Visits Reviewed, PCP, Specialist  PCP/Specialist Visits Compliance with follow-up visit  [reviewed recent pcp office visit to note encouragement to see someone for his mood]  Exercise Interventions   Exercise  Discussed/Reviewed Exercise Reviewed, Weight Managment  Weight Management Weight maintenance  [his weights were reviewed and noted a gain of 9 lbs]  Education Interventions   Education Provided Provided Education  Provided Verbal Education On Mental Health/Coping with Illness, Community Resources  Mental Health Interventions   Mental Health Discussed/Reviewed Mental Health Reviewed, Coping Strategies             SDOH assessments and interventions completed:  No     Care Coordination Interventions:  Yes, provided   Follow up plan: Follow up call scheduled for 12/11/22    Encounter Outcome:  Pt. Visit Completed   Ruchy Wildrick L. Noelle Penner, RN, BSN, CCM [facility] Care Management Community Coordinator Office number [phone]

## 2022-11-14 ENCOUNTER — Ambulatory Visit: Payer: Self-pay | Admitting: *Deleted

## 2022-11-14 NOTE — Patient Outreach (Signed)
  Care Coordination   Follow Up Visit Note   11/15/2022 Name: [name] MRN: [id number] DOB: 12/30/1945  [name] is a 77 y.o. year old male who sees Tommie Sams, DO for primary care. I spoke with  [name] wife, [name] by phone today. She left messages for RN CM   What matters to the patients health and wellness today?  Mood changes, increased anxiety  Wife reports Mr Sulkowski was anxious when she arrived home from assisting her brother. She reports he met her at the door to tell her about RN CM's outreach Wife reports patient has psychiatry [facility] benefits with medicine. Wife expresses that the present medicines are not working.  She confirms she will speak with the psychiatrist during he upcoming [facility] medical visit  She encouraged RN CM to only speak with her as needed to decrease the patient's increase anxiety   Goals Addressed             This Visit's Progress    THN care coordination   Not on track    Interventions Today    Flowsheet Row Most Recent Value  Chronic Disease   Chronic disease during today's visit Diabetes, Chronic Kidney Disease/End Stage Renal Disease (ESRD)  General Interventions   General Interventions Discussed/Reviewed General Interventions Reviewed, Durable Medical Equipment (DME), Doctor Visits, Labs  Labs Hgb A1c every 6 months  Doctor Visits Discussed/Reviewed Doctor Visits Reviewed, PCP, Specialist  PCP/Specialist Visits Compliance with follow-up visit  [reviewed recent pcp office visit to note encouragement to see someone for his mood]  Exercise Interventions   Exercise Discussed/Reviewed Exercise Reviewed, Weight Managment  Weight Management Weight maintenance  [his weights were reviewed and noted a gain of 9 lbs]  Education Interventions   Education Provided Provided Education  Provided Verbal Education On Mental Health/Coping with Illness, Community Resources  Mental Health Interventions   Mental Health  Discussed/Reviewed Mental Health Reviewed, Coping Strategies             SDOH assessments and interventions completed:  Yes     11/14/2022   12:41 PM 11/09/2022   10:53 AM 09/27/2022    3:33 PM 02/26/2022    2:36 PM  GAD 7 : Generalized Anxiety Score  Nervous, Anxious, on Edge 3 3 1 3   Control/stop worrying  3 3 3   Worry too much - different things 3 3 3 3   Trouble relaxing 3 2 2 2   Restless 2 2 1 2   Easily annoyed or irritable 3 3 3 2   Afraid - awful might happen 2 3 3 3   Total GAD 7 Score  19 16 18   Anxiety Difficulty Very difficult Very difficult Extremely difficult Very difficult      Care Coordination Interventions:  Yes, provided   Follow up plan: Follow up call scheduled for 12/11/22    Encounter Outcome:  Pt. Visit Completed   Mann Skaggs L. Noelle Penner, RN, BSN, CCM [facility] Care Management Community Coordinator Office number [phone]

## 2022-11-15 DIAGNOSIS — E1122 Type 2 diabetes mellitus with diabetic chronic kidney disease: Secondary | ICD-10-CM | POA: Diagnosis not present

## 2022-11-15 DIAGNOSIS — E1129 Type 2 diabetes mellitus with other diabetic kidney complication: Secondary | ICD-10-CM | POA: Diagnosis not present

## 2022-11-15 DIAGNOSIS — R809 Proteinuria, unspecified: Secondary | ICD-10-CM | POA: Diagnosis not present

## 2022-11-15 DIAGNOSIS — N1832 Chronic kidney disease, stage 3b: Secondary | ICD-10-CM | POA: Diagnosis not present

## 2022-11-15 NOTE — Patient Instructions (Signed)
Visit Information  Thank you for taking time to visit with me today. Please don't hesitate to contact me if I can be of assistance to you.   Following are the goals we discussed today:   Goals Addressed             This Visit's Progress    THN care coordination   Not on track    Interventions Today    Flowsheet Row Most Recent Value  Chronic Disease   Chronic disease during today's visit Diabetes, Chronic Kidney Disease/End Stage Renal Disease (ESRD)  General Interventions   General Interventions Discussed/Reviewed General Interventions Reviewed, Durable Medical Equipment (DME), Doctor Visits, Labs  Labs Hgb A1c every 6 months  Doctor Visits Discussed/Reviewed Doctor Visits Reviewed, PCP, Specialist  PCP/Specialist Visits Compliance with follow-up visit  [reviewed recent pcp office visit to note encouragement to see someone for his mood]  Exercise Interventions   Exercise Discussed/Reviewed Exercise Reviewed, Weight Managment  Weight Management Weight maintenance  [his weights were reviewed and noted a gain of 9 lbs]  Education Interventions   Education Provided Provided Education  Provided Verbal Education On Mental Health/Coping with Illness, Community Resources  Mental Health Interventions   Mental Health Discussed/Reviewed Mental Health Reviewed, Coping Strategies             Our next appointment is by telephone on 12/11/22 at 3:15 pm   Please call the care guide team at [phone] if you need to cancel or reschedule your appointment.   If you are experiencing a Mental Health or Behavioral Health Crisis or need someone to talk to, please call the Suicide and Crisis Lifeline: 988 call the [place] National Suicide Prevention Lifeline: 213-641-0152 or TTY: 250-479-1082 TTY 226-221-7609) to talk to a trained counselor call 1-800-273-TALK (toll free, 24 hour hotline) call the [facility]: [phone] call 911   No access to my chart and no  preference for copy of AVS  The patient has been provided with contact information for the care management team and has been advised to call with any health related questions or concerns.    Day Greb L. Noelle Penner, RN, BSN, CCM [facility] Care Management Community Coordinator Office number [phone]

## 2022-11-19 NOTE — Telephone Encounter (Signed)
Left message to return call 

## 2022-11-21 NOTE — Telephone Encounter (Signed)
Wife(DPR) notified 

## 2022-12-04 ENCOUNTER — Ambulatory Visit (INDEPENDENT_AMBULATORY_CARE_PROVIDER_SITE_OTHER): Payer: Medicare HMO | Admitting: Family Medicine

## 2022-12-04 DIAGNOSIS — F0393 Unspecified dementia, unspecified severity, with mood disturbance: Secondary | ICD-10-CM

## 2022-12-04 DIAGNOSIS — F39 Unspecified mood [affective] disorder: Secondary | ICD-10-CM | POA: Diagnosis not present

## 2022-12-04 MED ORDER — SERTRALINE HCL 100 MG PO TABS: 150.0000 mg | ORAL_TABLET | Freq: Every day | ORAL | 3 refills | Status: DC

## 2022-12-04 NOTE — Patient Instructions (Signed)
Increase in Zoloft.  Stop Remeron in 2 weeks.  Follow up in 1 month.  Referral placed to neurology.

## 2022-12-05 DIAGNOSIS — F0390 Unspecified dementia without behavioral disturbance: Secondary | ICD-10-CM | POA: Insufficient documentation

## 2022-12-05 DIAGNOSIS — F0393 Unspecified dementia, unspecified severity, with mood disturbance: Secondary | ICD-10-CM | POA: Insufficient documentation

## 2022-12-05 NOTE — Assessment & Plan Note (Signed)
Increasing Zoloft. Referral has already been placed to psychiatry.  D/C Remeron in 2 weeks.

## 2022-12-05 NOTE — Progress Notes (Signed)
Subjective:  Patient ID: [name], male    DOB: Feb 04, 1946  Age: 77 y.o. MRN: [id number]  CC: Follow up   HPI:  77 year old male with an extensive past medical history as outlined below presents for follow-up.  Patient very concerned about his blood sugars.  His average blood glucose per his continuous glucometer is 126.  Most recent A1c 6.0.  Has occasional hypoglycemia.  He is on 14 units of Lantus and is on Amaryl 2 mg twice daily.  May need dose reduction of Amaryl in the near future.  Patient continues to have issues with his mood.  Reports lack of interest and motivation.  He was placed on Aricept by the [place]. I have no records regarding dementia.  It is unclear whether dementia preceded his mood disturbance or vice versa. Has visual hallucinations but these have improved with increased dose of Risperdal.   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 12/06/2011   Type 2 diabetes mellitus (HCC) 06/02/2007   Hypercholesterolemia 06/02/2007   Essential hypertension 06/02/2007    Social Hx   Social History   Socioeconomic History   Marital status: Married    Spouse name: Not on file   Number of children: Not on file   Years of education: 1   Highest education level: Not on file  Occupational History   Occupation: Curator  Tobacco Use   Smoking status: Former    Current packs/day: 0.00    Types: Cigarettes    Quit date: 02/19/2018    Years since quitting: 4.7    Smokeless tobacco: Never  Vaping Use   Vaping status: Never Used  Substance and Sexual Activity   Alcohol use: No   Drug use: No   Sexual activity: Not on file  Other Topics Concern   Not on file  Social History Narrative   Not on file   Social Determinants of Health   Financial Resource Strain: Low Risk  (01/24/2022)   Overall Financial Resource Strain (CARDIA)    Difficulty of Paying Living Expenses: Not hard at all  Food Insecurity: No Food Insecurity (01/24/2022)   Hunger Vital Sign    Worried About Running Out of Food in the Last Year: Never true    Ran Out of Food in the Last Year: Never true  Transportation Needs: No Transportation Needs (01/24/2022)   PRAPARE - Administrator, Civil Service (Medical): No    Lack of Transportation (Non-Medical): No  Physical Activity: Not on file  Stress: No Stress Concern Present (01/24/2022)   [employer] of Occupational Health - Occupational Stress Questionnaire    Feeling of Stress : Only a little  Social Connections: Socially Isolated (01/26/2022)   Social Connection and Isolation Panel [NHANES]    Frequency of Communication with Friends and Family: Once a week    Frequency of Social Gatherings with Friends and Family: Once a week    Attends Religious Services: Never    Database administrator or Organizations: No    Attends Club or  Organization Meetings: Never    Marital Status: Married    Review of Systems Per HPI  Objective:  BP (!) 176/61   Pulse 63   Temp 98.1 F (36.7 C)   Ht 5\' 11"  (1.803 m)   Wt 174 lb 6.4 oz (79.1 kg)   SpO2 98%   BMI 24.32 kg/m      12/04/2022    3:11 PM 12/04/2022    2:36 PM 11/09/2022   10:40 AM  BP/Weight  Systolic BP 176 180 91  Diastolic BP 61 55 58  Wt. (Lbs)  174.4 173  BMI  24.32 kg/m2 24.13 kg/m2    Physical Exam Vitals and nursing note reviewed.  Constitutional:      General: He is not in acute distress.    Appearance: Normal appearance.  HENT:     Head:  Normocephalic and atraumatic.  Cardiovascular:     Rate and Rhythm: Normal rate and regular rhythm.  Pulmonary:     Effort: Pulmonary effort is normal. No respiratory distress.     Breath sounds: Normal breath sounds.  Neurological:     Mental Status: He is alert.  Psychiatric:     Comments: Flat affect. Depressed mood.      Lab Results  Component Value Date   WBC 10.7 11/09/2022   HGB 11.2 (L) 11/09/2022   HCT 34.2 (L) 11/09/2022   PLT 198 11/09/2022   GLUCOSE 128 (H) 11/09/2022   CHOL 152 11/09/2022   TRIG 64 11/09/2022   HDL 54 11/09/2022   LDLCALC 85 11/09/2022   ALT 12 11/09/2022   AST 15 11/09/2022   NA 142 11/09/2022   K 4.8 11/09/2022   CL 108 (H) 11/09/2022   CREATININE 2.23 (H) 11/09/2022   BUN 46 (H) 11/09/2022   CO2 19 (L) 11/09/2022   TSH 1.707 03/28/2018   INR 1.76 04/01/2018   HGBA1C 6.0 (H) 11/09/2022     Assessment & Plan:   Problem List Items Addressed This Visit       Nervous and Auditory   Dementia with mood disturbance (HCC)    Increasing Zoloft. Referral has already been placed to psychiatry.  D/C Remeron in 2 weeks.       Relevant Medications   sertraline (ZOLOFT) 100 MG tablet   Other Relevant Orders   Ambulatory referral to Neurology     Other   Mood disorder (HCC)    Increasing Zoloft. Referral has already been placed to psychiatry.  D/C Remeron in 2 weeks.        Meds ordered this encounter  Medications   sertraline (ZOLOFT) 100 MG tablet    Sig: Take 1.5 tablets (150 mg total) by mouth daily.    Dispense:  135 tablet    Refill:  3    Follow-up:  2 weeks.  Everlene Other DO [facility]

## 2022-12-11 ENCOUNTER — Ambulatory Visit: Payer: Self-pay | Admitting: *Deleted

## 2022-12-11 ENCOUNTER — Ambulatory Visit: Payer: Medicare HMO | Admitting: Family Medicine

## 2022-12-11 NOTE — Patient Outreach (Signed)
  Care Coordination   12/11/2022 Name: SUMANTH WOODRING MRN: [id number] DOB: [birth year]   Care Coordination Outreach Attempts:  An unsuccessful telephone outreach was attempted for a scheduled appointment today.  Follow Up Plan:  Additional outreach attempts will be made to offer the patient care coordination information and services.   Encounter Outcome:  No Answer   Care Coordination Interventions:  No, not indicated    Melvin Marmo L. Noelle Penner, RN, BSN, CCM [facility] Care Management Community Coordinator Office number [phone]

## 2022-12-25 ENCOUNTER — Ambulatory Visit: Payer: Self-pay | Admitting: *Deleted

## 2022-12-25 NOTE — Patient Outreach (Signed)
  Care Coordination   12/25/2022 Name: [name] MRN: [id number] DOB: 05/12/1945   Care Coordination Outreach Attempts:  An unsuccessful telephone outreach was attempted for a scheduled appointment today.  Follow Up Plan:  Additional outreach attempts will be made to offer the patient care coordination information and services.   Encounter Outcome:  No Answer   Care Coordination Interventions:  No, not indicated    Zyrus Hetland L. Noelle Penner, RN, BSN, CCM, Care Management Coordinator [phone]

## 2022-12-27 ENCOUNTER — Other Ambulatory Visit: Payer: Self-pay

## 2022-12-27 ENCOUNTER — Telehealth: Payer: Self-pay

## 2022-12-27 MED ORDER — DEXCOM G7 RECEIVER DEVI: 0 refills | Status: DC

## 2022-12-27 NOTE — Telephone Encounter (Signed)
Prescription Request  12/27/2022  LOV: Visit date not found  What is the name of the medication or equipment? Continuous Glucose Receiver (DEXCOM G7 RECEIVER) DEVI   Have you contacted your pharmacy to request a refill? Yes   Which pharmacy would you like this sent to?  [facility]   Patient notified that their request is being sent to the clinical staff for review and that they should receive a response within 2 business days.   Please advise at Mobile [phone] (mobile)

## 2023-01-02 ENCOUNTER — Ambulatory Visit: Payer: Self-pay | Admitting: *Deleted

## 2023-01-02 NOTE — Patient Outreach (Signed)
  Care Coordination   01/02/2023 Name: [name] MRN: [id number] DOB: 06-10-1945   Care Coordination Outreach Attempts:  A second unsuccessful outreach was attempted today to offer the patient with information about available care coordination services.  Follow Up Plan:  Additional outreach attempts will be made to offer the patient care coordination information and services.   Encounter Outcome:  No Answer   Care Coordination Interventions:  No, not indicated    Called wife's home and cell numbers without success   [name] L. Noelle Penner, RN, BSN, CCM, Care Management Coordinator [phone]

## 2023-01-03 ENCOUNTER — Ambulatory Visit: Payer: Medicare HMO | Admitting: Family Medicine

## 2023-01-03 ENCOUNTER — Other Ambulatory Visit: Payer: Self-pay | Admitting: Family Medicine

## 2023-01-21 ENCOUNTER — Ambulatory Visit (INDEPENDENT_AMBULATORY_CARE_PROVIDER_SITE_OTHER): Payer: Medicare HMO | Admitting: Family Medicine

## 2023-01-21 VITALS — BP 115/72 | HR 57 | Temp 97.3°F | Ht 71.0 in | Wt 172.0 lb

## 2023-01-21 DIAGNOSIS — D649 Anemia, unspecified: Secondary | ICD-10-CM

## 2023-01-21 DIAGNOSIS — R0602 Shortness of breath: Secondary | ICD-10-CM | POA: Diagnosis not present

## 2023-01-21 DIAGNOSIS — I2510 Atherosclerotic heart disease of native coronary artery without angina pectoris: Secondary | ICD-10-CM

## 2023-01-21 DIAGNOSIS — R6881 Early satiety: Secondary | ICD-10-CM | POA: Diagnosis not present

## 2023-01-21 DIAGNOSIS — I10 Essential (primary) hypertension: Secondary | ICD-10-CM

## 2023-01-21 DIAGNOSIS — D619 Aplastic anemia, unspecified: Secondary | ICD-10-CM | POA: Diagnosis not present

## 2023-01-21 NOTE — Patient Instructions (Signed)
Labs and xray today.  We will call with results.  Referral placed.  Follow up in 2 weeks.

## 2023-01-22 DIAGNOSIS — R0602 Shortness of breath: Secondary | ICD-10-CM | POA: Insufficient documentation

## 2023-01-22 DIAGNOSIS — R6881 Early satiety: Secondary | ICD-10-CM | POA: Insufficient documentation

## 2023-01-22 LAB — CBC
Hematocrit: 38.2 % (ref 37.5–51.0)
Hemoglobin: 12.3 g/dL — ABNORMAL LOW (ref 13.0–17.7)
MCH: 31.3 pg (ref 26.6–33.0)
MCHC: 32.2 g/dL (ref 31.5–35.7)
MCV: 97 fL (ref 79–97)
Platelets: 205 10*3/uL (ref 150–450)
RBC: 3.93 x10E6/uL — ABNORMAL LOW (ref 4.14–5.80)
RDW: 12.9 % (ref 11.6–15.4)
WBC: 12.4 10*3/uL — ABNORMAL HIGH (ref 3.4–10.8)

## 2023-01-22 LAB — CMP14+EGFR
ALT: 13 [IU]/L (ref 0–44)
AST: 18 [IU]/L (ref 0–40)
Albumin: 4.3 g/dL (ref 3.8–4.8)
Alkaline Phosphatase: 120 [IU]/L (ref 44–121)
BUN/Creatinine Ratio: 21 (ref 10–24)
BUN: 42 mg/dL — ABNORMAL HIGH (ref 8–27)
Bilirubin Total: 0.3 mg/dL (ref 0.0–1.2)
CO2: 19 mmol/L — ABNORMAL LOW (ref 20–29)
Calcium: 9.3 mg/dL (ref 8.6–10.2)
Chloride: 109 mmol/L — ABNORMAL HIGH (ref 96–106)
Creatinine, Ser: 2.04 mg/dL — ABNORMAL HIGH (ref 0.76–1.27)
Globulin, Total: 2.8 g/dL (ref 1.5–4.5)
Glucose: 106 mg/dL — ABNORMAL HIGH (ref 70–99)
Potassium: 4.6 mmol/L (ref 3.5–5.2)
Sodium: 143 mmol/L (ref 134–144)
Total Protein: 7.1 g/dL (ref 6.0–8.5)
eGFR: 33 mL/min/{1.73_m2} — ABNORMAL LOW (ref >59)

## 2023-01-22 LAB — IRON,TIBC AND FERRITIN PANEL
Ferritin: 107 ng/mL (ref 30–400)
Iron Saturation: 11 % — ABNORMAL LOW (ref 15–55)
Iron: 34 ug/dL — ABNORMAL LOW (ref 38–169)
Total Iron Binding Capacity: 297 ug/dL (ref 250–450)
UIBC: 263 ug/dL (ref 111–343)

## 2023-01-22 LAB — BRAIN NATRIURETIC PEPTIDE: BNP: 94.1 pg/mL (ref 0.0–100.0)

## 2023-01-22 LAB — FOLATE: Folate: 6.6 ng/mL (ref >3.0)

## 2023-01-22 LAB — VITAMIN B12: Vitamin B-12: >2000 pg/mL — ABNORMAL HIGH (ref 232–1245)

## 2023-01-22 NOTE — Assessment & Plan Note (Addendum)
Etiology unclear at this time.  Arranging for further evaluation with chest x-ray and laboratory studies including BNP.  Likely multifactorial but concern primarily for cardiac issues given coronary artery disease and prior stenting as well as CABG. referring to cardiology.

## 2023-01-22 NOTE — Assessment & Plan Note (Signed)
Hypertension - stable.

## 2023-01-22 NOTE — Assessment & Plan Note (Signed)
I reached out to gastroenterology.  They are going to see him very soon.  Awaiting labs.  No significant weight loss.

## 2023-01-22 NOTE — Progress Notes (Signed)
Subjective:  Patient ID: [name], male    DOB: 05/28/1945  Age: 77 y.o. MRN: [id number]  CC: Chief Complaint  Patient presents with   Shortness of Breath    With any activity , fatigue  Chest congestion    early satiety and nausea    Takes about 1 hr to finish meat due to nausea    HPI:  77 year old male with coronary disease, hypertension, Crohn's disease, type 2 diabetes, hyperlipidemia presents with the above complaints.  Patient reports ongoing shortness of breath for the past 1 to 2 weeks.  Occurs intermittently.  Does happen more so with exertion and with lying down at night.  He also reports that he feels full when eating.  He eats a small amount and feels very full and nauseous.  It takes him a long time to eat a meal.  He reports some associated abdominal pain at times.  He continues to have ongoing fatigue and low energy.  He is followed at the [place].  Unsure the last time he saw cardiology.  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 12/06/2011   Type 2 diabetes mellitus (HCC) 06/02/2007   Hypercholesterolemia 06/02/2007   Essential hypertension 06/02/2007    Social Hx   Social History   Socioeconomic History   Marital status: Married    Spouse name: Not on file   Number of children: Not on file   Years of education: 1   Highest education level: Not on file  Occupational History   Occupation:  Curator  Tobacco Use   Smoking status: Former    Current packs/day: 0.00    Types: Cigarettes    Quit date: 02/19/2018    Years since quitting: 4.9   Smokeless tobacco: Never  Vaping Use   Vaping status: Never Used  Substance and Sexual Activity   Alcohol use: No   Drug use: No   Sexual activity: Not on file  Other Topics Concern   Not on file  Social History Narrative   Not on file   Social Determinants of Health   Financial Resource Strain: Low Risk  (01/24/2022)   Overall Financial Resource Strain (CARDIA)    Difficulty of Paying Living Expenses: Not hard at all  Food Insecurity: No Food Insecurity (01/24/2022)   Hunger Vital Sign    Worried About Running Out of Food in the Last Year: Never true    Ran Out of Food in the Last Year: Never true  Transportation Needs: No Transportation Needs (01/24/2022)   PRAPARE - Administrator, Civil Service (Medical): No    Lack of Transportation (Non-Medical): No  Physical Activity: Not on file  Stress: No Stress Concern Present (01/24/2022)   [employer] of Occupational Health - Occupational Stress Questionnaire    Feeling of Stress : Only a little  Social Connections: Socially Isolated (01/26/2022)   Social Connection and Isolation Panel [NHANES]    Frequency of Communication with Friends and  Family: Once a week    Frequency of Social Gatherings with Friends and Family: Once a week    Attends Religious Services: Never    Diplomatic Services operational officer: No    Attends Engineer, structural: Never    Marital Status: Married    Review of Systems Per HPI  Objective:  BP 115/72   Pulse (!) 57   Temp (!) 97.3 F (36.3 C)   Ht 5\' 11"  (1.803 m)   Wt 172 lb (78 kg)   SpO2 99%   BMI 23.99 kg/m      01/21/2023    2:35 PM 12/04/2022    3:11 PM 12/04/2022    2:36 PM  BP/Weight  Systolic BP 115 176 180  Diastolic BP 72 61 55  Wt. (Lbs) 172  174.4  BMI 23.99 kg/m2  24.32 kg/m2    Physical  Exam Vitals and nursing note reviewed.  Constitutional:      General: He is not in acute distress.    Appearance: Normal appearance.  HENT:     Head: Normocephalic and atraumatic.  Eyes:     General:        Right eye: No discharge.        Left eye: No discharge.     Conjunctiva/sclera: Conjunctivae normal.  Cardiovascular:     Rate and Rhythm: Normal rate and regular rhythm.  Pulmonary:     Effort: Pulmonary effort is normal.     Breath sounds: No wheezing or rales.  Abdominal:     Palpations: Abdomen is soft.     Tenderness: There is no abdominal tenderness. There is no guarding or rebound.  Neurological:     Mental Status: He is alert.  Psychiatric:     Comments: Flat affect.  Depressed mood.     Lab Results  Component Value Date   WBC 12.4 (H) 01/21/2023   HGB 12.3 (L) 01/21/2023   HCT 38.2 01/21/2023   PLT 205 01/21/2023   GLUCOSE 106 (H) 01/21/2023   CHOL 152 11/09/2022   TRIG 64 11/09/2022   HDL 54 11/09/2022   LDLCALC 85 11/09/2022   ALT 13 01/21/2023   AST 18 01/21/2023   NA 143 01/21/2023   K 4.6 01/21/2023   CL 109 (H) 01/21/2023   CREATININE 2.04 (H) 01/21/2023   BUN 42 (H) 01/21/2023   CO2 19 (L) 01/21/2023   TSH 1.707 03/28/2018   INR 1.76 04/01/2018   HGBA1C 6.0 (H) 11/09/2022     Assessment & Plan:   Problem List Items Addressed This Visit       Cardiovascular and Mediastinum   CAD (coronary artery disease)   Relevant Orders   Ambulatory referral to Cardiology   Essential hypertension    Hypertension stable.      Relevant Orders   CMP14+EGFR (Completed)     Other   SOB (shortness of breath) - Primary    Etiology unclear at this time.  Arranging for further evaluation with chest x-ray and laboratory studies including BNP.  Likely multifactorial but concern primarily for cardiac issues given coronary artery disease and prior stenting as well as CABG. referring to cardiology.      Relevant Orders   DG Chest 2 View   Brain  natriuretic peptide (Completed)   Ambulatory referral to Cardiology   Early satiety    I reached out to gastroenterology.  They are going to see him very soon.  Awaiting labs.  No significant weight loss.  Other Visit Diagnoses     Anemia, unspecified type       Relevant Orders   CBC (Completed)   Iron, TIBC and Ferritin Panel (Completed)   Folate (Completed)   Vitamin B12 (Completed)       Follow-up:  Pending work up  [employer] DO [facility]

## 2023-01-23 ENCOUNTER — Ambulatory Visit: Payer: Self-pay | Admitting: *Deleted

## 2023-01-23 NOTE — Patient Instructions (Signed)
Visit Information  Thank you for taking time to visit with me today. Please don't hesitate to contact me if I can be of assistance to you.   Following are the goals we discussed today:   Goals Addressed             This Visit's Progress    manage hypertension, diabetes crohn's care coordination   On track    Interventions Today    Flowsheet Row Most Recent Value  Chronic Disease   Chronic disease during today's visit Diabetes, Other  [abdominal discomfort, decrease appetite]  General Interventions   General Interventions Discussed/Reviewed General Interventions Reviewed, Labs, Doctor Visits  Labs --  [iron value of <38]  Doctor Visits Discussed/Reviewed Doctor Visits Reviewed, PCP, Specialist  PCP/Specialist Visits Compliance with follow-up visit  Exercise Interventions   Exercise Discussed/Reviewed Exercise Reviewed, Physical Activity  [Mrs Nogueira states that the patient does not move a lot He sits most days (history of dementia)]  Education Interventions   Education Provided --  [encouraged fluids, reviewed drinks patient may like sparkling ICE vs soda]  Provided Verbal Education On Nutrition  Mental Health Interventions   Mental Health Discussed/Reviewed Mental Health Reviewed, Coping Strategies, Anxiety, Grief and Loss  [the patient has been sad recently related to the passing of his wife's brother.  Spouse reports he gets anxious at intervals]  Nutrition Interventions   Nutrition Discussed/Reviewed Nutrition Reviewed, Decreasing sugar intake, Increasing proteins, Fluid intake, Supplemental nutrition  [Encouraged fluid and protein intake]  Pharmacy Interventions   Pharmacy Dicussed/Reviewed Pharmacy Topics Reviewed, Affording Medications  Safety Interventions   Safety Discussed/Reviewed Safety Reviewed, Fall Risk  [no recent falls]             Our next appointment is by telephone on 01/27/23 at 1015  Please call the care guide team at [phone] if you need to  cancel or reschedule your appointment.   If you are experiencing a Mental Health or Behavioral Health Crisis or need someone to talk to, please call the Suicide and Crisis Lifeline: 988 call the [place] National Suicide Prevention Lifeline: 518-749-9500 or TTY: 510-110-5064 TTY 480-395-1569) to talk to a trained counselor call 1-800-273-TALK (toll free, 24 hour hotline) call the [facility]: [phone] call 911   No computer access, no preference for copy of AVS      The patient has been provided with contact information for the care management team and has been advised to call with any health related questions or concerns.   Kerry-Anne Mezo L. Noelle Penner, RN, BSN, CCM, Care Management Coordinator [phone]

## 2023-01-23 NOTE — Patient Outreach (Signed)
Care Coordination   Follow Up Visit Note   01/23/2023 Name: [name] MRN: [id number] DOB: June 30, 1945  [name] is a 77 y.o. year old male who sees Tommie Sams, DO for primary care. I spoke [name], spouse of  [name] by phone today.  What matters to the patients health and wellness today?  Continues with stomach discomfort Medical history of Crohn's, shortness of breath, increasing sleeping  Doing okay per spouse  Gastrointestinal  stomach symptoms have returned. Spouse reports abdominal distension and small torso. This is related to genes and decrease mobility per spouse. He continues to take his medicine as ordered    Wife/caregiver is doing well- she recently loss her brother She reports they are coping well  Shortness of breath when walking & eating.  Hypertension- managed  Sleeps a lot or sits in his chair iron on 01/21/23 at 34 v 38 or higher  Decreased appetite and fluid intake only taking in zero soda will not drink teas or water Spouse will try sparking ICE and water drinks   Goals Addressed             This Visit's Progress    manage hypertension, diabetes crohn's care coordination   On track    Interventions Today    Flowsheet Row Most Recent Value  Chronic Disease   Chronic disease during today's visit Diabetes, Other  [abdominal discomfort, decrease appetite]  General Interventions   General Interventions Discussed/Reviewed General Interventions Reviewed, Labs, Doctor Visits  Labs --  [iron value of <38]  Doctor Visits Discussed/Reviewed Doctor Visits Reviewed, PCP, Specialist  PCP/Specialist Visits Compliance with follow-up visit  Exercise Interventions   Exercise Discussed/Reviewed Exercise Reviewed, Physical Activity  [Mrs Blaisdell states that the patient does not move a lot He sits most days (history of dementia)]  Education Interventions   Education Provided --  [encouraged fluids, reviewed drinks patient may  like sparkling ICE vs soda]  Provided Verbal Education On Nutrition  Mental Health Interventions   Mental Health Discussed/Reviewed Mental Health Reviewed, Coping Strategies, Anxiety, Grief and Loss  [the patient has been sad recently related to the passing of his wife's brother.  Spouse reports he gets anxious at intervals]  Nutrition Interventions   Nutrition Discussed/Reviewed Nutrition Reviewed, Decreasing sugar intake, Increasing proteins, Fluid intake, Supplemental nutrition  [Encouraged fluid and protein intake]  Pharmacy Interventions   Pharmacy Dicussed/Reviewed Pharmacy Topics Reviewed, Affording Medications  Safety Interventions   Safety Discussed/Reviewed Safety Reviewed, Fall Risk  [no recent falls]             SDOH assessments and interventions completed:  No     Care Coordination Interventions:  Yes, provided   Follow up plan: Follow up call scheduled for 02/27/23    Encounter Outcome:  Patient Visit Completed   Cala Bradford L. Noelle Penner, RN, BSN, CCM, Care Management Coordinator [phone]

## 2023-01-28 ENCOUNTER — Ambulatory Visit (INDEPENDENT_AMBULATORY_CARE_PROVIDER_SITE_OTHER): Payer: Medicare HMO | Admitting: Gastroenterology

## 2023-02-04 ENCOUNTER — Ambulatory Visit: Payer: Medicare HMO | Admitting: Family Medicine

## 2023-02-21 ENCOUNTER — Ambulatory Visit: Payer: Medicare HMO | Admitting: Family Medicine

## 2023-02-27 ENCOUNTER — Ambulatory Visit: Payer: Self-pay | Admitting: *Deleted

## 2023-02-27 DIAGNOSIS — F1021 Alcohol dependence, in remission: Secondary | ICD-10-CM | POA: Insufficient documentation

## 2023-02-27 DIAGNOSIS — H526 Other disorders of refraction: Secondary | ICD-10-CM | POA: Insufficient documentation

## 2023-02-27 DIAGNOSIS — Z122 Encounter for screening for malignant neoplasm of respiratory organs: Secondary | ICD-10-CM | POA: Insufficient documentation

## 2023-02-27 DIAGNOSIS — Q667 Congenital pes cavus, unspecified foot: Secondary | ICD-10-CM | POA: Insufficient documentation

## 2023-02-27 DIAGNOSIS — M7740 Metatarsalgia, unspecified foot: Secondary | ICD-10-CM | POA: Insufficient documentation

## 2023-02-27 DIAGNOSIS — H9190 Unspecified hearing loss, unspecified ear: Secondary | ICD-10-CM | POA: Insufficient documentation

## 2023-02-27 DIAGNOSIS — Z77098 Contact with and (suspected) exposure to other hazardous, chiefly nonmedicinal, chemicals: Secondary | ICD-10-CM | POA: Insufficient documentation

## 2023-02-27 DIAGNOSIS — F17220 Nicotine dependence, chewing tobacco, uncomplicated: Secondary | ICD-10-CM | POA: Insufficient documentation

## 2023-02-27 DIAGNOSIS — I779 Disorder of arteries and arterioles, unspecified: Secondary | ICD-10-CM | POA: Insufficient documentation

## 2023-02-27 DIAGNOSIS — G4710 Hypersomnia, unspecified: Secondary | ICD-10-CM | POA: Insufficient documentation

## 2023-02-27 DIAGNOSIS — G4733 Obstructive sleep apnea (adult) (pediatric): Secondary | ICD-10-CM | POA: Insufficient documentation

## 2023-02-27 NOTE — Patient Outreach (Signed)
  Care Coordination   Follow Up Visit Note   02/27/2023 Name: [name] MRN: [id number] DOB: 10-05-1945  [name] is a 77 y.o. year old male who sees Paul Sams, DO for primary care. I spoke with  [name] by phone today.  What matters to the patients health and wellness today?  Follow up office visit via telephone, [employer] transportation options    Goals Addressed             This Visit's Progress    manage hypertension, diabetes crohn's, Transportation (02/21/23 pcp) care coordination   Not on track    Interventions Today    Flowsheet Row Most Recent Value  Chronic Disease   Chronic disease during today's visit Hypertension (HTN), Other  [wife requesting a telephone follow up visit with pcp as wife with injury, not driving, patient will not ride with anyone else needs more crestor]  General Interventions   General Interventions Discussed/Reviewed General Interventions Reviewed, Doctor Visits, [facility], Communication with  [Reviewed with wife the available transportation options (Insurance benefit, county benefit) for patient None of these preferred by patient Ony comfortable with riding with wife]  Doctor Visits Discussed/Reviewed Doctor Visits Reviewed, PCP  [name] 02/21/23 2 week follow up visit after wife unable to transport him to visit Wife has an injury Patient only rides with wife]  PCP/Specialist Visits Contact provider for referral to  [facility] provider for referral to PCP  [clinical note message sent to pcp/staff to request a telephone follow visit]  Communication with PCP/Specialists  Education Interventions   Education Provided Provided Education  Provided Verbal Education On [employer], Other, Water engineer options]  Mental Health Interventions   Mental Health Discussed/Reviewed Mental Health Reviewed, Coping Strategies  Pharmacy Interventions   Pharmacy Dicussed/Reviewed Pharmacy  Topics Reviewed, Medications and their functions  [needing more crestor per wife]             SDOH assessments and interventions completed:  Yes  SDOH Interventions Today    Flowsheet Row Most Recent Value  SDOH Interventions   Transportation Interventions Payor Benefit, Patient Resources (Friends/Family), Patient Declined        Care Coordination Interventions:  Yes, provided   Follow up plan: Follow up call scheduled for 03/29/23    Encounter Outcome:  Patient Visit Completed   Paul Bradford L. Noelle Penner, RN, BSN, [facility]  VBCI Care Management Coordinator  [phone]  Fax: [phone]

## 2023-02-27 NOTE — Patient Instructions (Signed)
Visit Information  Thank you for taking time to visit with me today. Please don't hesitate to contact me if I can be of assistance to you.   Following are the goals we discussed today:   Goals Addressed             This Visit's Progress    manage hypertension, diabetes crohn's, Transportation (02/21/23 pcp) care coordination   Not on track    Interventions Today    Flowsheet Row Most Recent Value  Chronic Disease   Chronic disease during today's visit Hypertension (HTN), Other  [wife requesting a telephone follow up visit with pcp as wife with injury, not driving, patient will not ride with anyone else needs more crestor]  General Interventions   General Interventions Discussed/Reviewed General Interventions Reviewed, Doctor Visits, [facility], Communication with  [Reviewed with wife the available transportation options (Insurance benefit, county benefit) for patient None of these preferred by patient Ony comfortable with riding with wife]  Doctor Visits Discussed/Reviewed Doctor Visits Reviewed, PCP  [facility] 02/21/23 2 week follow up visit after wife unable to transport him to visit Wife has an injury Patient only rides with wife]  PCP/Specialist Visits Contact provider for referral to  [facility] provider for referral to PCP  [clinical note message sent to pcp/staff to request a telephone follow visit]  Communication with PCP/Specialists  Education Interventions   Education Provided Provided Education  Provided Verbal Education On [employer], Other, Community Resources  [transportation options]  Mental Health Interventions   Mental Health Discussed/Reviewed Mental Health Reviewed, Coping Strategies  Pharmacy Interventions   Pharmacy Dicussed/Reviewed Pharmacy Topics Reviewed, Medications and their functions  [needing more crestor per wife]             Our next appointment is by telephone on 03/29/23 at 1000  Please call the care guide team at [phone] if you  need to cancel or reschedule your appointment.   If you are experiencing a Mental Health or Behavioral Health Crisis or need someone to talk to, please call the Suicide and Crisis Lifeline: 988 call the [place] National Suicide Prevention Lifeline: 989-250-0217 or TTY: 858-113-5464 TTY 8315418030) to talk to a trained counselor call 1-800-273-TALK (toll free, 24 hour hotline) call the [facility]: [phone] call 911   No computer access, no preference for copy of AVS      The patient has been provided with contact information for the care management team and has been advised to call with any health related questions or concerns.   Lavaya Defreitas L. Noelle Penner, RN, BSN, [facility]  VBCI Care Management Coordinator  [phone]  Fax: [phone]

## 2023-02-28 ENCOUNTER — Other Ambulatory Visit: Payer: Self-pay | Admitting: Family Medicine

## 2023-02-28 MED ORDER — ROSUVASTATIN CALCIUM 20 MG PO TABS: 20.0000 mg | ORAL_TABLET | Freq: Every day | ORAL | 3 refills | Status: DC

## 2023-03-05 ENCOUNTER — Ambulatory Visit (INDEPENDENT_AMBULATORY_CARE_PROVIDER_SITE_OTHER): Payer: Medicare HMO | Admitting: Gastroenterology

## 2023-03-20 ENCOUNTER — Ambulatory Visit: Payer: Medicare HMO | Admitting: Internal Medicine

## 2023-03-28 ENCOUNTER — Emergency Department (HOSPITAL_COMMUNITY): Payer: No Typology Code available for payment source

## 2023-03-28 ENCOUNTER — Encounter (HOSPITAL_COMMUNITY): Payer: Self-pay

## 2023-03-28 ENCOUNTER — Other Ambulatory Visit: Payer: Self-pay

## 2023-03-28 ENCOUNTER — Inpatient Hospital Stay (HOSPITAL_COMMUNITY)
Admission: EM | Admit: 2023-03-28 | Discharge: 2023-03-30 | DRG: 683 | Disposition: A | Discharge status: Home / Self Care | Payer: No Typology Code available for payment source | Attending: Family Medicine | Admitting: Family Medicine

## 2023-03-28 DIAGNOSIS — K520 Gastroenteritis and colitis due to radiation: Secondary | ICD-10-CM | POA: Insufficient documentation

## 2023-03-28 DIAGNOSIS — E1122 Type 2 diabetes mellitus with diabetic chronic kidney disease: Secondary | ICD-10-CM | POA: Diagnosis present

## 2023-03-28 DIAGNOSIS — K5730 Diverticulosis of large intestine without perforation or abscess without bleeding: Secondary | ICD-10-CM | POA: Diagnosis not present

## 2023-03-28 DIAGNOSIS — R059 Cough, unspecified: Secondary | ICD-10-CM | POA: Diagnosis not present

## 2023-03-28 DIAGNOSIS — N2889 Other specified disorders of kidney and ureter: Secondary | ICD-10-CM | POA: Diagnosis not present

## 2023-03-28 DIAGNOSIS — E11649 Type 2 diabetes mellitus with hypoglycemia without coma: Secondary | ICD-10-CM | POA: Diagnosis not present

## 2023-03-28 DIAGNOSIS — R112 Nausea with vomiting, unspecified: Secondary | ICD-10-CM | POA: Diagnosis not present

## 2023-03-28 DIAGNOSIS — Z955 Presence of coronary angioplasty implant and graft: Secondary | ICD-10-CM

## 2023-03-28 DIAGNOSIS — Z8249 Family history of ischemic heart disease and other diseases of the circulatory system: Secondary | ICD-10-CM

## 2023-03-28 DIAGNOSIS — Z8546 Personal history of malignant neoplasm of prostate: Secondary | ICD-10-CM

## 2023-03-28 DIAGNOSIS — E860 Dehydration: Secondary | ICD-10-CM | POA: Diagnosis present

## 2023-03-28 DIAGNOSIS — I129 Hypertensive chronic kidney disease with stage 1 through stage 4 chronic kidney disease, or unspecified chronic kidney disease: Secondary | ICD-10-CM | POA: Diagnosis present

## 2023-03-28 DIAGNOSIS — N179 Acute kidney failure, unspecified: Principal | ICD-10-CM | POA: Diagnosis present

## 2023-03-28 DIAGNOSIS — Z794 Long term (current) use of insulin: Secondary | ICD-10-CM

## 2023-03-28 DIAGNOSIS — E1165 Type 2 diabetes mellitus with hyperglycemia: Secondary | ICD-10-CM | POA: Diagnosis present

## 2023-03-28 DIAGNOSIS — D649 Anemia, unspecified: Secondary | ICD-10-CM | POA: Diagnosis present

## 2023-03-28 DIAGNOSIS — I2510 Atherosclerotic heart disease of native coronary artery without angina pectoris: Secondary | ICD-10-CM | POA: Diagnosis present

## 2023-03-28 DIAGNOSIS — K5090 Crohn's disease, unspecified, without complications: Secondary | ICD-10-CM | POA: Diagnosis not present

## 2023-03-28 DIAGNOSIS — Z7984 Long term (current) use of oral hypoglycemic drugs: Secondary | ICD-10-CM

## 2023-03-28 DIAGNOSIS — I700 Atherosclerosis of aorta: Secondary | ICD-10-CM | POA: Diagnosis present

## 2023-03-28 DIAGNOSIS — K5010 Crohn's disease of large intestine without complications: Secondary | ICD-10-CM | POA: Diagnosis not present

## 2023-03-28 DIAGNOSIS — I10 Essential (primary) hypertension: Secondary | ICD-10-CM | POA: Diagnosis present

## 2023-03-28 DIAGNOSIS — Z79899 Other long term (current) drug therapy: Secondary | ICD-10-CM

## 2023-03-28 DIAGNOSIS — N1831 Chronic kidney disease, stage 3a: Secondary | ICD-10-CM | POA: Diagnosis present

## 2023-03-28 DIAGNOSIS — R7989 Other specified abnormal findings of blood chemistry: Secondary | ICD-10-CM

## 2023-03-28 DIAGNOSIS — R531 Weakness: Secondary | ICD-10-CM | POA: Diagnosis not present

## 2023-03-28 DIAGNOSIS — E785 Hyperlipidemia, unspecified: Secondary | ICD-10-CM | POA: Diagnosis not present

## 2023-03-28 DIAGNOSIS — Z885 Allergy status to narcotic agent status: Secondary | ICD-10-CM

## 2023-03-28 DIAGNOSIS — Z87891 Personal history of nicotine dependence: Secondary | ICD-10-CM

## 2023-03-28 DIAGNOSIS — Z951 Presence of aortocoronary bypass graft: Secondary | ICD-10-CM

## 2023-03-28 DIAGNOSIS — R109 Unspecified abdominal pain: Secondary | ICD-10-CM | POA: Diagnosis not present

## 2023-03-28 DIAGNOSIS — Z7982 Long term (current) use of aspirin: Secondary | ICD-10-CM

## 2023-03-28 DIAGNOSIS — E162 Hypoglycemia, unspecified: Secondary | ICD-10-CM

## 2023-03-28 DIAGNOSIS — N189 Chronic kidney disease, unspecified: Secondary | ICD-10-CM

## 2023-03-28 DIAGNOSIS — Z888 Allergy status to other drugs, medicaments and biological substances status: Secondary | ICD-10-CM

## 2023-03-28 DIAGNOSIS — R110 Nausea: Secondary | ICD-10-CM | POA: Diagnosis not present

## 2023-03-28 DIAGNOSIS — K219 Gastro-esophageal reflux disease without esophagitis: Secondary | ICD-10-CM | POA: Diagnosis not present

## 2023-03-28 DIAGNOSIS — F32A Depression, unspecified: Secondary | ICD-10-CM | POA: Insufficient documentation

## 2023-03-28 DIAGNOSIS — I440 Atrioventricular block, first degree: Secondary | ICD-10-CM | POA: Diagnosis present

## 2023-03-28 LAB — URINALYSIS, ROUTINE W REFLEX MICROSCOPIC
Bilirubin Urine: NEGATIVE
Glucose, UA: NEGATIVE mg/dL
Hgb urine dipstick: NEGATIVE
Ketones, ur: NEGATIVE mg/dL
Leukocytes,Ua: NEGATIVE
Nitrite: NEGATIVE
Protein, ur: 100 mg/dL — AB
Specific Gravity, Urine: 1.014 (ref 1.005–1.030)
pH: 5 (ref 5.0–8.0)

## 2023-03-28 LAB — CBC WITH DIFFERENTIAL/PLATELET
Abs Immature Granulocytes: 0.03 10*3/uL (ref 0.00–0.07)
Basophils Absolute: 0 10*3/uL (ref 0.0–0.1)
Basophils Relative: 0 %
Eosinophils Absolute: 0.1 10*3/uL (ref 0.0–0.5)
Eosinophils Relative: 1 %
HCT: 35.1 % — ABNORMAL LOW (ref 39.0–52.0)
Hemoglobin: 10.9 g/dL — ABNORMAL LOW (ref 13.0–17.0)
Immature Granulocytes: 0 %
Lymphocytes Relative: 10 %
Lymphs Abs: 1 10*3/uL (ref 0.7–4.0)
MCH: 30.8 pg (ref 26.0–34.0)
MCHC: 31.1 g/dL (ref 30.0–36.0)
MCV: 99.2 fL (ref 80.0–100.0)
Monocytes Absolute: 0.7 10*3/uL (ref 0.1–1.0)
Monocytes Relative: 7 %
Neutro Abs: 8.4 10*3/uL — ABNORMAL HIGH (ref 1.7–7.7)
Neutrophils Relative %: 82 %
Platelets: 176 10*3/uL (ref 150–400)
RBC: 3.54 MIL/uL — ABNORMAL LOW (ref 4.22–5.81)
RDW: 13.4 % (ref 11.5–15.5)
WBC: 10.3 10*3/uL (ref 4.0–10.5)
nRBC: 0 % (ref 0.0–0.2)

## 2023-03-28 LAB — COMPREHENSIVE METABOLIC PANEL
ALT: 22 U/L (ref 0–44)
AST: 19 U/L (ref 15–41)
Albumin: 3.5 g/dL (ref 3.5–5.0)
Alkaline Phosphatase: 96 U/L (ref 38–126)
Anion gap: 12 (ref 5–15)
BUN: 43 mg/dL — ABNORMAL HIGH (ref 8–23)
CO2: 16 mmol/L — ABNORMAL LOW (ref 22–32)
Calcium: 9.2 mg/dL (ref 8.9–10.3)
Chloride: 109 mmol/L (ref 98–111)
Creatinine, Ser: 3.03 mg/dL — ABNORMAL HIGH (ref 0.61–1.24)
GFR, Estimated: 20 mL/min — ABNORMAL LOW (ref >60)
Glucose, Bld: 111 mg/dL — ABNORMAL HIGH (ref 70–99)
Potassium: 3.8 mmol/L (ref 3.5–5.1)
Sodium: 137 mmol/L (ref 135–145)
Total Bilirubin: 0.4 mg/dL (ref <1.2)
Total Protein: 7.3 g/dL (ref 6.5–8.1)

## 2023-03-28 LAB — LACTIC ACID, PLASMA: Lactic Acid, Venous: 1.7 mmol/L (ref 0.5–1.9)

## 2023-03-28 LAB — BLOOD GAS, VENOUS
Acid-base deficit: 8.8 mmol/L — ABNORMAL HIGH (ref 0.0–2.0)
Bicarbonate: 18.4 mmol/L — ABNORMAL LOW (ref 20.0–28.0)
Drawn by: 7213
O2 Saturation: 23.9 %
Patient temperature: 36.6
pCO2, Ven: 43 mm[Hg] — ABNORMAL LOW (ref 44–60)
pH, Ven: 7.24 — ABNORMAL LOW (ref 7.25–7.43)
pO2, Ven: <31 mm[Hg] — CL (ref 32–45)

## 2023-03-28 LAB — TROPONIN I (HIGH SENSITIVITY)
Troponin I (High Sensitivity): 280 ng/L (ref <18)
Troponin I (High Sensitivity): 199 ng/L (ref <18)

## 2023-03-28 LAB — CBG MONITORING, ED: Glucose-Capillary: 96 mg/dL (ref 70–99)

## 2023-03-28 MED ORDER — FAMOTIDINE IN NACL 20-0.9 MG/50ML-% IV SOLN
20.0000 mg | Freq: Once | INTRAVENOUS | Status: AC
  Administered 2023-03-28: 20 mg via INTRAVENOUS
  Filled 2023-03-28: qty 50

## 2023-03-28 MED ORDER — DONEPEZIL HCL 5 MG PO TABS
5.0000 mg | ORAL_TABLET | Freq: Every day | ORAL | Status: DC
  Administered 2023-03-29 (×2): 5 mg via ORAL
  Filled 2023-03-28: qty 1

## 2023-03-28 MED ORDER — SERTRALINE HCL 50 MG PO TABS
150.0000 mg | ORAL_TABLET | Freq: Every day | ORAL | Status: DC
  Administered 2023-03-29 – 2023-03-30 (×2): 150 mg via ORAL
  Filled 2023-03-28 (×2): qty 3

## 2023-03-28 MED ORDER — ONDANSETRON HCL 4 MG PO TABS: 4.0000 mg | ORAL_TABLET | Freq: Four times a day (QID) | ORAL | Status: DC | PRN

## 2023-03-28 MED ORDER — TRAZODONE HCL 50 MG PO TABS
25.0000 mg | ORAL_TABLET | Freq: Every evening | ORAL | Status: DC | PRN
  Administered 2023-03-29: 25 mg via ORAL
  Filled 2023-03-28: qty 1

## 2023-03-28 MED ORDER — MIRTAZAPINE 15 MG PO TABS
7.5000 mg | ORAL_TABLET | Freq: Every day | ORAL | Status: DC
  Administered 2023-03-29 (×2): 7.5 mg via ORAL
  Filled 2023-03-28: qty 1

## 2023-03-28 MED ORDER — ROSUVASTATIN CALCIUM 20 MG PO TABS
20.0000 mg | ORAL_TABLET | Freq: Every day | ORAL | Status: DC
  Administered 2023-03-29 – 2023-03-30 (×2): 20 mg via ORAL
  Filled 2023-03-28 (×2): qty 1

## 2023-03-28 MED ORDER — MAGNESIUM HYDROXIDE 400 MG/5ML PO SUSP
30.0000 mL | Freq: Every day | ORAL | Status: DC | PRN
Start: 2023-03-28 — End: 2023-03-29

## 2023-03-28 MED ORDER — ACETAMINOPHEN 325 MG PO TABS
650.0000 mg | ORAL_TABLET | Freq: Four times a day (QID) | ORAL | Status: DC | PRN
  Filled 2023-03-28: qty 2

## 2023-03-28 MED ORDER — ALBUTEROL SULFATE (2.5 MG/3ML) 0.083% IN NEBU: 2.5000 mg | INHALATION_SOLUTION | RESPIRATORY_TRACT | Status: DC | PRN

## 2023-03-28 MED ORDER — ONDANSETRON HCL 4 MG/2ML IJ SOLN: 4.0000 mg | Freq: Four times a day (QID) | INTRAMUSCULAR | Status: DC | PRN

## 2023-03-28 MED ORDER — PROPRANOLOL HCL 20 MG PO TABS
40.0000 mg | ORAL_TABLET | Freq: Two times a day (BID) | ORAL | Status: DC
  Administered 2023-03-29 – 2023-03-30 (×3): 40 mg via ORAL
  Filled 2023-03-28 (×3): qty 2

## 2023-03-28 MED ORDER — ENOXAPARIN SODIUM 30 MG/0.3ML IJ SOSY
30.0000 mg | PREFILLED_SYRINGE | INTRAMUSCULAR | Status: DC
  Administered 2023-03-29 – 2023-03-30 (×2): 30 mg via SUBCUTANEOUS
  Filled 2023-03-28 (×2): qty 0.3

## 2023-03-28 MED ORDER — RISPERIDONE 0.5 MG PO TABS
0.5000 mg | ORAL_TABLET | Freq: Two times a day (BID) | ORAL | Status: DC
  Administered 2023-03-29 – 2023-03-30 (×4): 0.5 mg via ORAL
  Filled 2023-03-28: qty 1
  Filled 2023-03-28: qty 2
  Filled 2023-03-28 (×2): qty 1

## 2023-03-28 MED ORDER — VITAMIN D 25 MCG (1000 UNIT) PO TABS
1000.0000 [IU] | ORAL_TABLET | Freq: Every day | ORAL | Status: DC
  Administered 2023-03-29 – 2023-03-30 (×2): 1000 [IU] via ORAL
  Filled 2023-03-28 (×2): qty 1

## 2023-03-28 MED ORDER — ALBUTEROL SULFATE HFA 108 (90 BASE) MCG/ACT IN AERS: 2.0000 | INHALATION_SPRAY | RESPIRATORY_TRACT | Status: DC | PRN

## 2023-03-28 MED ORDER — SODIUM CHLORIDE 0.9 % IV SOLN: INTRAVENOUS | Status: AC

## 2023-03-28 MED ORDER — ACETAMINOPHEN 650 MG RE SUPP: 650.0000 mg | Freq: Four times a day (QID) | RECTAL | Status: DC | PRN

## 2023-03-28 MED ORDER — SODIUM CHLORIDE 0.9 % IV BOLUS
1000.0000 mL | Freq: Once | INTRAVENOUS | Status: AC
  Administered 2023-03-28: 1000 mL via INTRAVENOUS

## 2023-03-28 MED ORDER — ONDANSETRON HCL 4 MG/2ML IJ SOLN
4.0000 mg | Freq: Once | INTRAMUSCULAR | Status: AC
  Administered 2023-03-28: 4 mg via INTRAVENOUS
  Filled 2023-03-28: qty 2

## 2023-03-28 MED ORDER — MESALAMINE 1.2 G PO TBEC
1.2000 g | DELAYED_RELEASE_TABLET | Freq: Two times a day (BID) | ORAL | Status: DC
  Administered 2023-03-29 – 2023-03-30 (×3): 1.2 g via ORAL
  Filled 2023-03-28 (×7): qty 1

## 2023-03-28 NOTE — H&P (Incomplete)
[place]   PATIENT NAME: [name]    MR#:  [id number]  DATE OF BIRTH:  February 14, 1946  DATE OF ADMISSION:  03/28/2023  PRIMARY CARE PHYSICIAN: Tommie Sams, DO   Patient is coming from: Home  REQUESTING/REFERRING PHYSICIAN: Meridee Score, MD  CHIEF COMPLAINT:   Chief Complaint  Patient presents with  . Nausea    HISTORY OF PRESENT ILLNESS:  JCION PROHASKA is a 77 y.o. male with medical history significant for ***  ED Course: *** EKG as reviewed by me : *** Imaging: *** PAST MEDICAL HISTORY:   Past Medical History:  Diagnosis Date  . Adenocarcinoma of prostate (HCC)   . Anxiety   . CAD (coronary artery disease)    a. s/p prior stenting of LAD in 1996 and angioplasty alone in 1999 to LAD by review of prior notes.   . 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 Crohn's disease (HCC)   . CTS (carpal tunnel syndrome)    Mild  . Depression   . Diabetes mellitus without complication (HCC)    Type 2  . Hyperlipidemia   . Hypertension   . Kidney stone   . Optic neuritis     PAST SURGICAL HISTORY:   Past Surgical History:  Procedure Laterality Date  . COLONOSCOPY    . CORONARY ARTERY BYPASS GRAFT N/A 04/01/2018   Procedure: CORONARY ARTERY BYPASS GRAFTING (CABG) TIMES FOUR: (LIMA to LAD, CRYO VEIN to OM1, SVG to OM2, and SVG to PDA) with PARTIAL EVH/OPEN from RIGHT and LEFT GREATER SAPHENOUS VEIN and LEFT INTERNAL MAMMARY ARTERY;  Surgeon: Kerin Perna, MD;  Location: [facility] OR;  Service: Open Heart Surgery;  Laterality: N/A;  . KNEE CARTILAGE SURGERY Right   . LEFT HEART CATH AND CORONARY ANGIOGRAPHY N/A 03/27/2018   Procedure: LEFT HEART CATH AND CORONARY ANGIOGRAPHY;  Surgeon: Lennette Bihari, MD;  Location: [facility] INVASIVE CV LAB;  Service: Cardiovascular;  Laterality: N/A;  . LOWER EXTREMITY ANGIOGRAPHY N/A 08/13/2016   Procedure: Lower Extremity Angiography;  Surgeon: Runell Gess, MD;  Location: [facility] INVASIVE CV LAB;  Service: Cardiovascular;   Laterality: N/A;  . NASAL SINUS SURGERY    . PROSTATE SURGERY    . TEE WITHOUT CARDIOVERSION N/A 04/01/2018   Procedure: TRANSESOPHAGEAL ECHOCARDIOGRAM (TEE);  Surgeon: Donata Clay, Theron Arista, MD;  Location: [facility] OR;  Service: Open Heart Surgery;  Laterality: N/A;    SOCIAL HISTORY:   Social History   Tobacco Use  . Smoking status: Former    Current packs/day: 0.00    Types: Cigarettes    Quit date: 02/19/2018    Years since quitting: 5.1  . Smokeless tobacco: Never  Substance Use Topics  . Alcohol use: No    FAMILY HISTORY:   Family History  Problem Relation Age of Onset  . Hypertension Mother   . Colon cancer Mother   . Heart attack Father   . Coronary artery disease Father   . Hypertension Sister   . Crohn's disease Brother     DRUG ALLERGIES:   Allergies  Allergen Reactions  . Buprenorphine Other (See Comments)  . Buprenorphine Hcl Other (See Comments)  . Flomax [Tamsulosin Hcl]     UNSPECIFIED REACTION to HIGH DOSE   . Morphine And Codeine     UNSPECIFIED REACTION     REVIEW OF SYSTEMS:   ROS As per history of present illness. All pertinent systems were reviewed above. Constitutional, HEENT, cardiovascular, respiratory, GI, GU, musculoskeletal, neuro,  psychiatric, endocrine, integumentary and hematologic systems were reviewed and are otherwise negative/unremarkable except for positive findings mentioned above in the HPI.   MEDICATIONS AT HOME:   Prior to Admission medications   Medication Sig Start Date End Date Taking? Authorizing Provider  albuterol (VENTOLIN HFA) 108 (90 Base) MCG/ACT inhaler Inhale 2 puffs into the lungs every 4 (four) hours as needed for wheezing or shortness of breath (cough). 01/11/22   Shon Hale, MD  ascorbic acid (VITAMIN C) 500 MG tablet Take 1,000 mg by mouth daily.    [provider]  aspirin EC 81 MG tablet Take 1 tablet (81 mg total) by mouth daily with breakfast. 01/11/22   Shon Hale, MD  Cholecalciferol 25  MCG (1000 UT) CHEW Chew 25 Units by mouth daily.    [provider]  donepezil (ARICEPT) 5 MG tablet Take 1 tablet (5 mg total) by mouth at bedtime. 12/08/21   Tommie Sams, DO  glimepiride (AMARYL) 2 MG tablet Take 1 tablet (2 mg total) by mouth 2 (two) times daily with a meal. 01/11/22 01/11/23  Emokpae, Courage, MD  insulin glargine (LANTUS SOLOSTAR) 100 UNIT/ML Solostar Pen Inject 20 Units into the skin at bedtime. Patient taking differently: Inject 14 Units into the skin at bedtime. 02/26/22   Tommie Sams, DO  Insulin Pen Needle 32G X 4 MM MISC 1 Needle by Does not apply route at bedtime. For insulin administration 01/11/22   Shon Hale, MD  ISOSORBIDE PO Take 5 mg by mouth daily.    [provider]  mesalamine (LIALDA) 1.2 g EC tablet Take 1.2 g by mouth 2 (two) times daily.    [provider]  mirtazapine (REMERON) 7.5 MG tablet Take 1 tablet (7.5 mg total) by mouth at bedtime. 01/11/22   Shon Hale, MD  omeprazole (PRILOSEC) 20 MG capsule Take 1 capsule (20 mg total) by mouth daily. 01/11/22   Shon Hale, MD  propranolol (INDERAL) 40 MG tablet Take 1 tablet (40 mg total) by mouth 2 (two) times daily. 01/11/22   Shon Hale, MD  risperiDONE (RISPERDAL) 1 MG tablet Take 0.5 tablets (0.5 mg total) by mouth 2 (two) times daily. 09/27/22   Tommie Sams, DO  rosuvastatin (CRESTOR) 20 MG tablet Take 1 tablet (20 mg total) by mouth daily. 02/28/23   Tommie Sams, DO  sertraline (ZOLOFT) 100 MG tablet Take 1.5 tablets (150 mg total) by mouth daily. 12/04/22   Tommie Sams, DO      VITAL SIGNS:  Blood pressure (!) 166/72, pulse 84, temperature 97.6 F (36.4 C), temperature source Oral, resp. rate 18, height 5\' 11"  (1.803 m), weight 77.1 kg, SpO2 97%.  PHYSICAL EXAMINATION:  Physical Exam  GENERAL:  77 y.o.-year-old patient lying in the bed with no acute distress.  EYES: Pupils equal, round, reactive to light and accommodation. No scleral icterus.  Extraocular muscles intact.  HEENT: Head atraumatic, normocephalic. Oropharynx and nasopharynx clear.  NECK:  Supple, no jugular venous distention. No thyroid enlargement, no tenderness.  LUNGS: Normal breath sounds bilaterally, no wheezing, rales,rhonchi or crepitation. No use of accessory muscles of respiration.  CARDIOVASCULAR: Regular rate and rhythm, S1, S2 normal. No murmurs, rubs, or gallops.  ABDOMEN: Soft, nondistended, nontender. Bowel sounds present. No organomegaly or mass.  EXTREMITIES: No pedal edema, cyanosis, or clubbing.  NEUROLOGIC: Cranial nerves II through XII are intact. Muscle strength 5/5 in all extremities. Sensation intact. Gait not checked.  PSYCHIATRIC: The patient is alert and oriented x  3.  Normal affect and good eye contact. SKIN: No obvious rash, lesion, or ulcer.   LABORATORY PANEL:   CBC Recent Labs  Lab 03/28/23 1534  WBC 10.3  HGB 10.9*  HCT 35.1*  PLT 176   ------------------------------------------------------------------------------------------------------------------  Chemistries  Recent Labs  Lab 03/28/23 1534  NA 137  K 3.8  CL 109  CO2 16*  GLUCOSE 111*  BUN 43*  CREATININE 3.03*  CALCIUM 9.2  AST 19  ALT 22  ALKPHOS 96  BILITOT 0.4   ------------------------------------------------------------------------------------------------------------------  Cardiac Enzymes No results for input(s): "TROPONINI" in the last 168 hours. ------------------------------------------------------------------------------------------------------------------  RADIOLOGY:  CT ABDOMEN PELVIS WO CONTRAST  Result Date: 03/28/2023 CLINICAL DATA:  Abdominal pain.  Nausea. EXAM: CT ABDOMEN AND PELVIS WITHOUT CONTRAST TECHNIQUE: Multidetector CT imaging of the abdomen and pelvis was performed following the standard protocol without IV contrast. RADIATION DOSE REDUCTION: This exam was performed according to the departmental dose-optimization program which  includes automated exposure control, adjustment of the mA and/or kV according to patient size and/or use of iterative reconstruction technique. COMPARISON:  MRI abdomen 06/08/2022. FINDINGS: Lower chest: No acute abnormality. Hepatobiliary: No focal liver abnormality is seen. No gallstones, gallbladder wall thickening, or biliary dilatation. Pancreas: Unremarkable. No pancreatic ductal dilatation or surrounding inflammatory changes. Spleen: Normal in size without focal abnormality. Adrenals/Urinary Tract: There is mild left renal atrophy and scarring. There are prominent vascular calcifications in both kidneys. No definitive renal calculi seen. There is a rounded area in the inferior pole the right kidney measuring 14 mm with peripheral calcification which appears unchanged in size from prior MRI. There is no hydronephrosis. The adrenal glands and bladder are within normal limits. Stomach/Bowel: Stomach is within normal limits. Appendix is not seen. No evidence of bowel wall thickening, distention, or inflammatory changes. There is sigmoid colon diverticulosis. Vascular/Lymphatic: Aortic atherosclerosis. No enlarged abdominal or pelvic lymph nodes. Reproductive: Prostate radiotherapy seeds are present Other: There are small fat containing inguinal hernias. There is no ascites. Musculoskeletal: Degenerative changes affect the spine. Sternotomy wires are present. IMPRESSION: 1. No acute localizing process in the abdomen or pelvis. 2. Stable right renal lesion with peripheral calcification. 3. Sigmoid colon diverticulosis. 4. Aortic atherosclerosis. Aortic Atherosclerosis (ICD10-I70.0). Electronically Signed   By: Darliss Cheney M.D.   On: 03/28/2023 22:57   DG Chest Port 1 View  Result Date: 03/28/2023 CLINICAL DATA:  Cough and weakness EXAM: PORTABLE CHEST 1 VIEW COMPARISON:  01/09/2022 FINDINGS: Generous left ventricular contour. Prior CABG. There is no edema, consolidation, effusion, or pneumothorax. IMPRESSION:  No evidence of active disease. Electronically Signed   By: Tiburcio Pea M.D.   On: 03/28/2023 20:17      IMPRESSION AND PLAN:  Assessment and Plan: No notes have been filed under this hospital service. Service: Hospitalist      DVT prophylaxis: Lovenox***  Advanced Care Planning:  Code Status: full code***  Family Communication:  The plan of care was discussed in details with the patient (and family). I answered all questions. The patient agreed to proceed with the above mentioned plan. Further management will depend upon hospital course. Disposition Plan: Back to previous home environment Consults called: none***  All the records are reviewed and case discussed with ED provider.  Status is: Observation {Observation:23811}   At the time of the admission, it appears that the appropriate admission status for this patient is inpatient.  This is judged to be reasonable and necessary in order to provide the required intensity of service to  ensure the patient's safety given the presenting symptoms, physical exam findings and initial radiographic and laboratory data in the context of comorbid conditions.  The patient requires inpatient status due to high intensity of service, high risk of further deterioration and high frequency of surveillance required.  I certify that at the time of admission, it is my clinical judgment that the patient will require inpatient hospital care extending more than 2 midnights.                            Dispo: The patient is from: Home              Anticipated d/c is to: Home              Patient currently is not medically stable to d/c.              Difficult to place patient: No  Hannah Beat M.D on 03/28/2023 at 11:58 PM  [facility]   From 7 PM-7 AM, contact night-coverage [email or web address]  CC: Primary care physician; Tommie Sams, DO

## 2023-03-28 NOTE — ED Provider Notes (Signed)
[place] EMERGENCY DEPARTMENT AT [facility] Provider Note   CSN: [id number] Arrival date & time: 03/28/23  1448     History  Chief Complaint  Patient presents with   Nausea    [name] is a 77 y.o. male.  He has a history of CKD, diabetes, hypertension, dementia.  He said for a week or 2 he has had nausea and a few episodes of vomiting.  Some loose stools.  No abdominal pain or fever.  Some cough.  Feeling generally weak and dizzy.  Blood sugars have been dropping into the 30s so he has been chewing on candy but has no appetite to eat food.  Has continued to take his diabetes medications.  The history is provided by the patient.  Emesis Severity:  Moderate Duration:  1 week Timing:  Intermittent Progression:  Unchanged Chronicity:  New Relieved by:  None tried Worsened by:  Nothing Ineffective treatments:  None tried Associated symptoms: cough and diarrhea   Associated symptoms: no abdominal pain and no fever        Home Medications Prior to Admission medications   Medication Sig Start Date End Date Taking? Authorizing Provider  albuterol (VENTOLIN HFA) 108 (90 Base) MCG/ACT inhaler Inhale 2 puffs into the lungs every 4 (four) hours as needed for wheezing or shortness of breath (cough). 01/11/22   Shon Hale, MD  ascorbic acid (VITAMIN C) 500 MG tablet Take 1,000 mg by mouth daily.    [provider]  aspirin EC 81 MG tablet Take 1 tablet (81 mg total) by mouth daily with breakfast. 01/11/22   Shon Hale, MD  Cholecalciferol 25 MCG (1000 UT) CHEW Chew 25 Units by mouth daily.    [provider]  donepezil (ARICEPT) 5 MG tablet Take 1 tablet (5 mg total) by mouth at bedtime. 12/08/21   Tommie Sams, DO  glimepiride (AMARYL) 2 MG tablet Take 1 tablet (2 mg total) by mouth 2 (two) times daily with a meal. 01/11/22 01/11/23  Emokpae, Courage, MD  insulin glargine (LANTUS SOLOSTAR) 100 UNIT/ML Solostar Pen Inject 20 Units into  the skin at bedtime. Patient taking differently: Inject 14 Units into the skin at bedtime. 02/26/22   Tommie Sams, DO  Insulin Pen Needle 32G X 4 MM MISC 1 Needle by Does not apply route at bedtime. For insulin administration 01/11/22   Shon Hale, MD  ISOSORBIDE PO Take 5 mg by mouth daily.    [provider]  mesalamine (LIALDA) 1.2 g EC tablet Take 1.2 g by mouth 2 (two) times daily.    [provider]  mirtazapine (REMERON) 7.5 MG tablet Take 1 tablet (7.5 mg total) by mouth at bedtime. 01/11/22   Shon Hale, MD  omeprazole (PRILOSEC) 20 MG capsule Take 1 capsule (20 mg total) by mouth daily. 01/11/22   Shon Hale, MD  propranolol (INDERAL) 40 MG tablet Take 1 tablet (40 mg total) by mouth 2 (two) times daily. 01/11/22   Shon Hale, MD  risperiDONE (RISPERDAL) 1 MG tablet Take 0.5 tablets (0.5 mg total) by mouth 2 (two) times daily. 09/27/22   Tommie Sams, DO  rosuvastatin (CRESTOR) 20 MG tablet Take 1 tablet (20 mg total) by mouth daily. 02/28/23   Tommie Sams, DO  sertraline (ZOLOFT) 100 MG tablet Take 1.5 tablets (150 mg total) by mouth daily. 12/04/22   Tommie Sams, DO      Allergies    Buprenorphine, Buprenorphine hcl, Flomax [tamsulosin  hcl], and Morphine and codeine    Review of Systems   Review of Systems  Constitutional:  Positive for fatigue. Negative for fever.  Respiratory:  Positive for cough.   Cardiovascular:  Negative for chest pain.  Gastrointestinal:  Positive for diarrhea and vomiting. Negative for abdominal pain.  Genitourinary:  Negative for dysuria.    Physical Exam Updated Vital Signs BP (!) 157/81 (BP Location: Right Arm)   Pulse 87   Temp 97.9 F (36.6 C)   Resp 18   Ht 5\' 11"  (1.803 m)   Wt 77.1 kg   SpO2 100%   BMI 23.71 kg/m  Physical Exam Vitals and nursing note reviewed.  Constitutional:      General: He is not in acute distress.    Appearance: Normal appearance. He is well-developed.  HENT:      Head: Normocephalic and atraumatic.  Eyes:     Conjunctiva/sclera: Conjunctivae normal.  Cardiovascular:     Rate and Rhythm: Normal rate and regular rhythm.     Heart sounds: No murmur heard. Pulmonary:     Effort: Pulmonary effort is normal. No respiratory distress.     Breath sounds: Normal breath sounds.  Abdominal:     Palpations: Abdomen is soft.     Tenderness: There is no abdominal tenderness. There is no guarding or rebound.  Musculoskeletal:        General: No deformity.     Cervical back: Neck supple.  Skin:    General: Skin is warm and dry.     Capillary Refill: Capillary refill takes less than 2 seconds.  Neurological:     General: No focal deficit present.     Mental Status: He is alert.     ED Results / Procedures / Treatments   Labs (all labs ordered are listed, but only abnormal results are displayed) Labs Reviewed  CBC WITH DIFFERENTIAL/PLATELET - Abnormal; Notable for the following components:      Result Value   RBC 3.54 (*)    Hemoglobin 10.9 (*)    HCT 35.1 (*)    Neutro Abs 8.4 (*)    All other components within normal limits  COMPREHENSIVE METABOLIC PANEL - Abnormal; Notable for the following components:   CO2 16 (*)    Glucose, Bld 111 (*)    BUN 43 (*)    Creatinine, Ser 3.03 (*)    GFR, Estimated 20 (*)    All other components within normal limits  BLOOD GAS, VENOUS - Abnormal; Notable for the following components:   pH, Ven 7.24 (*)    pCO2, Ven 43 (*)    pO2, Ven <31 (*)    Bicarbonate 18.4 (*)    Acid-base deficit 8.8 (*)    All other components within normal limits  URINALYSIS, ROUTINE W REFLEX MICROSCOPIC - Abnormal; Notable for the following components:   APPearance HAZY (*)    Protein, ur 100 (*)    Bacteria, UA RARE (*)    All other components within normal limits  BASIC METABOLIC PANEL - Abnormal; Notable for the following components:   CO2 18 (*)    Glucose, Bld 59 (*)    BUN 44 (*)    Creatinine, Ser 2.86 (*)    Calcium  8.5 (*)    GFR, Estimated 22 (*)    All other components within normal limits  CBC - Abnormal; Notable for the following components:   RBC 2.94 (*)    Hemoglobin 9.6 (*)    HCT  29.2 (*)    All other components within normal limits  TROPONIN I (HIGH SENSITIVITY) - Abnormal; Notable for the following components:   Troponin I (High Sensitivity) 280 (*)    All other components within normal limits  TROPONIN I (HIGH SENSITIVITY) - Abnormal; Notable for the following components:   Troponin I (High Sensitivity) 199 (*)    All other components within normal limits  LACTIC ACID, PLASMA  CBG MONITORING, ED    EKG EKG Interpretation Date/Time:  Thursday March 28 2023 14:56:47 EST Ventricular Rate:  86 PR Interval:  216 QRS Duration:  82 QT Interval:  392 QTC Calculation: 469 R Axis:   -16  Text Interpretation: Sinus rhythm with 1st degree A-V block Left ventricular hypertrophy with repolarization abnormality ( R in aVL ) Abnormal ECG When compared with ECG of 10-Jan-2022 10:55, T wave inversion now evident in Lateral leads Confirmed by Meridee Score [phone]) on 03/28/2023 2:58:01 PM  Radiology CT ABDOMEN PELVIS WO CONTRAST  Result Date: 03/28/2023 CLINICAL DATA:  Abdominal pain.  Nausea. EXAM: CT ABDOMEN AND PELVIS WITHOUT CONTRAST TECHNIQUE: Multidetector CT imaging of the abdomen and pelvis was performed following the standard protocol without IV contrast. RADIATION DOSE REDUCTION: This exam was performed according to the departmental dose-optimization program which includes automated exposure control, adjustment of the mA and/or kV according to patient size and/or use of iterative reconstruction technique. COMPARISON:  MRI abdomen 06/08/2022. FINDINGS: Lower chest: No acute abnormality. Hepatobiliary: No focal liver abnormality is seen. No gallstones, gallbladder wall thickening, or biliary dilatation. Pancreas: Unremarkable. No pancreatic ductal dilatation or surrounding inflammatory  changes. Spleen: Normal in size without focal abnormality. Adrenals/Urinary Tract: There is mild left renal atrophy and scarring. There are prominent vascular calcifications in both kidneys. No definitive renal calculi seen. There is a rounded area in the inferior pole the right kidney measuring 14 mm with peripheral calcification which appears unchanged in size from prior MRI. There is no hydronephrosis. The adrenal glands and bladder are within normal limits. Stomach/Bowel: Stomach is within normal limits. Appendix is not seen. No evidence of bowel wall thickening, distention, or inflammatory changes. There is sigmoid colon diverticulosis. Vascular/Lymphatic: Aortic atherosclerosis. No enlarged abdominal or pelvic lymph nodes. Reproductive: Prostate radiotherapy seeds are present Other: There are small fat containing inguinal hernias. There is no ascites. Musculoskeletal: Degenerative changes affect the spine. Sternotomy wires are present. IMPRESSION: 1. No acute localizing process in the abdomen or pelvis. 2. Stable right renal lesion with peripheral calcification. 3. Sigmoid colon diverticulosis. 4. Aortic atherosclerosis. Aortic Atherosclerosis (ICD10-I70.0). Electronically Signed   By: Darliss Cheney M.D.   On: 03/28/2023 22:57   DG Chest Port 1 View  Result Date: 03/28/2023 CLINICAL DATA:  Cough and weakness EXAM: PORTABLE CHEST 1 VIEW COMPARISON:  01/09/2022 FINDINGS: Generous left ventricular contour. Prior CABG. There is no edema, consolidation, effusion, or pneumothorax. IMPRESSION: No evidence of active disease. Electronically Signed   By: Tiburcio Pea M.D.   On: 03/28/2023 20:17    Procedures Procedures    Medications Ordered in ED Medications  propranolol (INDERAL) tablet 40 mg (has no administration in time range)  rosuvastatin (CRESTOR) tablet 20 mg (has no administration in time range)  donepezil (ARICEPT) tablet 5 mg (has no administration in time range)  mirtazapine (REMERON)  tablet 7.5 mg (has no administration in time range)  risperiDONE (RISPERDAL) tablet 0.5 mg (0.5 mg Oral Given 03/29/23 0012)  sertraline (ZOLOFT) tablet 150 mg (has no administration in time range)  mesalamine (LIALDA)  EC tablet 1.2 g (has no administration in time range)  Cholecalciferol CHEW 25 Units (has no administration in time range)  enoxaparin (LOVENOX) injection 30 mg (has no administration in time range)  0.9 %  sodium chloride infusion ( Intravenous Infusion Verify 03/29/23 0443)  acetaminophen (TYLENOL) tablet 650 mg (has no administration in time range)    Or  acetaminophen (TYLENOL) suppository 650 mg (has no administration in time range)  traZODone (DESYREL) tablet 25 mg (has no administration in time range)  magnesium hydroxide (MILK OF MAGNESIA) suspension 30 mL (has no administration in time range)  ondansetron (ZOFRAN) tablet 4 mg (has no administration in time range)    Or  ondansetron (ZOFRAN) injection 4 mg (has no administration in time range)  albuterol (PROVENTIL) (2.5 MG/3ML) 0.083% nebulizer solution 2.5 mg (has no administration in time range)  sodium chloride 0.9 % bolus 1,000 mL (0 mLs Intravenous Stopped 03/28/23 2217)  ondansetron (ZOFRAN) injection 4 mg (4 mg Intravenous Given 03/28/23 1936)  famotidine (PEPCID) IVPB 20 mg premix (0 mg Intravenous Stopped 03/28/23 2021)  propranolol (INDERAL) tablet 40 mg (40 mg Oral Given 03/29/23 0012)    ED Course/ Medical Decision Making/ A&P Clinical Course as of 03/29/23 0920  Thu Mar 28, 2023  2020 Chest x-ray does not show any acute infiltrate.  Awaiting radiology reading. [MB]  2021 Patient's lab work showing a low bicarb and elevated creatinine from his baseline.  Hemoglobin slightly down from baseline.  Will put him in for a noncontrast CT. [MB]  2208 Discussed with with cardiology Dr. Michae Kava.  He felt with the AKI and no chest pain and the troponin falling that the patient would not need transfer to [facility] for  cardiology input at this time. [MB]  2329 Discussed with [facility] hospitalist Dr. Arville Care who will evaluate patient for admission [MB]    Clinical Course User Index [MB] Terrilee Files, MD                                 Medical Decision Making Amount and/or Complexity of Data Reviewed Labs: ordered. Radiology: ordered. ECG/medicine tests: ordered.  Risk Prescription drug management. Decision regarding hospitalization.   This patient complains of nausea poor appetite, low blood sugars, general weakness; this involves an extensive number of treatment Options and is a complaint that carries with it a high risk of complications and morbidity. The differential includes viral syndrome, gastritis, intra-abdominal process, dehydration, AKI, metabolic derangement, ACS  I ordered, reviewed and interpreted labs, which included CBC with normal white count, hemoglobin slightly down from priors, VBG with slight acidosis, chemistries with low bicarb elevated creatinine, troponins elevated need to be trended, urinalysis without clear signs of infection, lactate normal I ordered medication IV fluids nausea medication Pepcid and reviewed PMP when indicated. I ordered imaging studies which included chest x-ray and CT abdomen and pelvis and I independently    visualized and interpreted imaging which showed no acute findings Previous records obtained and reviewed in epic including recent PCP notes I consulted cardiology Dr. Michae Kava and [facility] hospitalist Dr. Arville Care and discussed lab and imaging findings and discussed disposition.  Cardiac monitoring reviewed, sinus rhythm Social determinants considered, patient with social isolation and depression Critical Interventions: None  After the interventions stated above, I reevaluated the patient and found patient to be resting comfortably hemodynamically stable Admission and further testing considered, patient would benefit from mission the hospital for  continued  hydration and correction of AKI along with establishing that he is eating enough food for his diabetic regiment.  Patient in agreement with plan for admission         Final Clinical Impression(s) / ED Diagnoses Final diagnoses:  AKI (acute kidney injury) (HCC)  Nausea and vomiting, unspecified vomiting type  Hypoglycemia  Elevated troponin    Rx / DC Orders ED Discharge Orders     None         Terrilee Files, MD 03/29/23 [phone]

## 2023-03-28 NOTE — H&P (Addendum)
[place]   PATIENT NAME: [name]    MR#:  [id number]  DATE OF BIRTH:  [birth year]  DATE OF ADMISSION:  03/28/2023  PRIMARY CARE PHYSICIAN: Tommie Sams, DO   Patient is coming from: Home  REQUESTING/REFERRING PHYSICIAN: Meridee Score, MD  CHIEF COMPLAINT:   Chief Complaint  Patient presents with   Nausea    HISTORY OF PRESENT ILLNESS:  [name] is a 77 y.o. Caucasian male with medical history significant for coronary artery disease status post PCI and stents, Crohn's disease, anxiety, depression, type 2 diabetes mellitus, hypertension and dyslipidemia, who presented to the emergency room with acute onset of recurrent nausea and vomiting for the last 1 to 2 weeks.  He admitted to occasional loose stools.  No abdominal pain or melena or bright red bleeding per rectum.  No fever or chills.  He has been having mild cough without wheezing or dyspnea.  He has been feeling generally weak and dizzy.  His blood glucose has been dropping to the 30s.  He has been showing uncaring the [place] has not been having much appetite lately.  He continued to take his diabetic medications.  No dysuria, oliguria or hematuria or flank pain.    ED Course: When the patient came to the ER, BP was 198/81 with respiratory rate of 23 with otherwise normal vital signs.  Blood gas revealed pH of 7.24 and HCO3 18.4 and CMP revealed a BUN of 43 with a creatinine of 3.03 compared to 42/2.04 on 01/21/2023.  High sensitive troponin I was 280 and later 199.  CBC showed anemia with hemoglobin 10.9 and 35.1 down from 12.3 and 38.2.  Lactic acid was 1.7.  UA was unremarkable. EKG as reviewed by me :  EKG showed normal sinus rhythm with a rate of 86 with first-degree AV block and LVH with repolarization abnormality and T wave inversion laterally. Imaging: Portable chest x-ray showed no acute cardiopulmonary disease.  The patient was given 4 mg of IV Zofran and 20 mg of IV Pepcid as well as 1 L  bolus of IV normal saline.  The case was discussed with cardiology at [facility] and the patient was thought to have troponin leak and that this is not likely cardiac given lack of chest pain.  He will be admitted to a medical telemetry observation bed for further evaluation and management. PAST MEDICAL HISTORY:   Past Medical History:  Diagnosis Date   Adenocarcinoma of prostate (HCC)    Anxiety    CAD (coronary artery disease)    a. s/p prior stenting of LAD in 1996 and angioplasty alone in 1999 to LAD by review of prior notes.    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    Crohn's disease (HCC)    CTS (carpal tunnel syndrome)    Mild   Depression    Diabetes mellitus without complication (HCC)    Type 2   Hyperlipidemia    Hypertension    Kidney stone    Optic neuritis     PAST SURGICAL HISTORY:   Past Surgical History:  Procedure Laterality Date   COLONOSCOPY     CORONARY ARTERY BYPASS GRAFT N/A 04/01/2018   Procedure: CORONARY ARTERY BYPASS GRAFTING (CABG) TIMES FOUR: (LIMA to LAD, CRYO VEIN to OM1, SVG to OM2, and SVG to PDA) with PARTIAL EVH/OPEN from RIGHT and LEFT GREATER SAPHENOUS VEIN and LEFT INTERNAL MAMMARY ARTERY;  Surgeon: Kerin Perna, MD;  Location: [facility] OR;  Service: Open  Heart Surgery;  Laterality: N/A;   KNEE CARTILAGE SURGERY Right    LEFT HEART CATH AND CORONARY ANGIOGRAPHY N/A 03/27/2018   Procedure: LEFT HEART CATH AND CORONARY ANGIOGRAPHY;  Surgeon: Lennette Bihari, MD;  Location: [facility] INVASIVE CV LAB;  Service: Cardiovascular;  Laterality: N/A;   LOWER EXTREMITY ANGIOGRAPHY N/A 08/13/2016   Procedure: Lower Extremity Angiography;  Surgeon: Runell Gess, MD;  Location: [facility] INVASIVE CV LAB;  Service: Cardiovascular;  Laterality: N/A;   NASAL SINUS SURGERY     PROSTATE SURGERY     TEE WITHOUT CARDIOVERSION N/A 04/01/2018   Procedure: TRANSESOPHAGEAL ECHOCARDIOGRAM (TEE);  Surgeon: Donata Clay, Theron Arista, MD;  Location: [facility] OR;  Service: Open Heart Surgery;  Laterality: N/A;     SOCIAL HISTORY:   Social History   Tobacco Use   Smoking status: Former    Current packs/day: 0.00    Types: Cigarettes    Quit date: 02/19/2018    Years since quitting: 5.1   Smokeless tobacco: Never  Substance Use Topics   Alcohol use: No    FAMILY HISTORY:   Family History  Problem Relation Age of Onset   Hypertension Mother    Colon cancer Mother    Heart attack Father    Coronary artery disease Father    Hypertension Sister    Crohn's disease Brother     DRUG ALLERGIES:   Allergies  Allergen Reactions   Buprenorphine Other (See Comments)   Buprenorphine Hcl Other (See Comments)   Flomax [Tamsulosin Hcl]     UNSPECIFIED REACTION to HIGH DOSE    Morphine And Codeine     UNSPECIFIED REACTION     REVIEW OF SYSTEMS:   ROS As per history of present illness. All pertinent systems were reviewed above. Constitutional, HEENT, cardiovascular, respiratory, GI, GU, musculoskeletal, neuro, psychiatric, endocrine, integumentary and hematologic systems were reviewed and are otherwise negative/unremarkable except for positive findings mentioned above in the HPI.   MEDICATIONS AT HOME:   Prior to Admission medications   Medication Sig Start Date End Date Taking? Authorizing Provider  albuterol (VENTOLIN HFA) 108 (90 Base) MCG/ACT inhaler Inhale 2 puffs into the lungs every 4 (four) hours as needed for wheezing or shortness of breath (cough). 01/11/22   Shon Hale, MD  ascorbic acid (VITAMIN C) 500 MG tablet Take 1,000 mg by mouth daily.    [provider]  aspirin EC 81 MG tablet Take 1 tablet (81 mg total) by mouth daily with breakfast. 01/11/22   Shon Hale, MD  Cholecalciferol 25 MCG (1000 UT) CHEW Chew 25 Units by mouth daily.    [provider]  donepezil (ARICEPT) 5 MG tablet Take 1 tablet (5 mg total) by mouth at bedtime. 12/08/21   Tommie Sams, DO  glimepiride (AMARYL) 2 MG tablet Take 1 tablet (2 mg total) by mouth 2 (two) times  daily with a meal. 01/11/22 01/11/23  Emokpae, Courage, MD  insulin glargine (LANTUS SOLOSTAR) 100 UNIT/ML Solostar Pen Inject 20 Units into the skin at bedtime. Patient taking differently: Inject 14 Units into the skin at bedtime. 02/26/22   Tommie Sams, DO  Insulin Pen Needle 32G X 4 MM MISC 1 Needle by Does not apply route at bedtime. For insulin administration 01/11/22   Shon Hale, MD  ISOSORBIDE PO Take 5 mg by mouth daily.    [provider]  mesalamine (LIALDA) 1.2 g EC tablet Take 1.2 g by mouth 2 (two) times daily.    [provider]  mirtazapine (REMERON) 7.5 MG tablet Take 1 tablet (7.5 mg total) by mouth at bedtime. 01/11/22   Shon Hale, MD  omeprazole (PRILOSEC) 20 MG capsule Take 1 capsule (20 mg total) by mouth daily. 01/11/22   Shon Hale, MD  propranolol (INDERAL) 40 MG tablet Take 1 tablet (40 mg total) by mouth 2 (two) times daily. 01/11/22   Shon Hale, MD  risperiDONE (RISPERDAL) 1 MG tablet Take 0.5 tablets (0.5 mg total) by mouth 2 (two) times daily. 09/27/22   Tommie Sams, DO  rosuvastatin (CRESTOR) 20 MG tablet Take 1 tablet (20 mg total) by mouth daily. 02/28/23   Tommie Sams, DO  sertraline (ZOLOFT) 100 MG tablet Take 1.5 tablets (150 mg total) by mouth daily. 12/04/22   Tommie Sams, DO      VITAL SIGNS:  Blood pressure (!) 169/71, pulse 61, temperature 97.8 F (36.6 C), temperature source Oral, resp. rate 19, height 5\' 11"  (1.803 m), weight 77.1 kg, SpO2 96%.  PHYSICAL EXAMINATION:  Physical Exam  GENERAL:  77 y.o.-year-old Caucasian male patient lying in the bed with no acute distress.  EYES: Pupils equal, round, reactive to light and accommodation. No scleral icterus. Extraocular muscles intact.  HEENT: Head atraumatic, normocephalic. Oropharynx and nasopharynx clear.  NECK:  Supple, no jugular venous distention. No thyroid enlargement, no tenderness.  LUNGS: Normal breath sounds bilaterally, no wheezing, rales,rhonchi  or crepitation. No use of accessory muscles of respiration.  CARDIOVASCULAR: Regular rate and rhythm, S1, S2 normal. No murmurs, rubs, or gallops.  ABDOMEN: Soft, nondistended, nontender. Bowel sounds present. No organomegaly or mass.  EXTREMITIES: No pedal edema, cyanosis, or clubbing.  NEUROLOGIC: Cranial nerves II through XII are intact. Muscle strength 5/5 in all extremities. Sensation intact. Gait not checked.  PSYCHIATRIC: The patient is alert and oriented x 3.  Normal affect and good eye contact. SKIN: No obvious rash, lesion, or ulcer.   LABORATORY PANEL:   CBC Recent Labs  Lab 03/28/23 1534  WBC 10.3  HGB 10.9*  HCT 35.1*  PLT 176   ------------------------------------------------------------------------------------------------------------------  Chemistries  Recent Labs  Lab 03/28/23 1534  NA 137  K 3.8  CL 109  CO2 16*  GLUCOSE 111*  BUN 43*  CREATININE 3.03*  CALCIUM 9.2  AST 19  ALT 22  ALKPHOS 96  BILITOT 0.4   ------------------------------------------------------------------------------------------------------------------  Cardiac Enzymes No results for input(s): "TROPONINI" in the last 168 hours. ------------------------------------------------------------------------------------------------------------------  RADIOLOGY:  CT ABDOMEN PELVIS WO CONTRAST  Result Date: 03/28/2023 CLINICAL DATA:  Abdominal pain.  Nausea. EXAM: CT ABDOMEN AND PELVIS WITHOUT CONTRAST TECHNIQUE: Multidetector CT imaging of the abdomen and pelvis was performed following the standard protocol without IV contrast. RADIATION DOSE REDUCTION: This exam was performed according to the departmental dose-optimization program which includes automated exposure control, adjustment of the mA and/or kV according to patient size and/or use of iterative reconstruction technique. COMPARISON:  MRI abdomen 06/08/2022. FINDINGS: Lower chest: No acute abnormality. Hepatobiliary: No focal liver  abnormality is seen. No gallstones, gallbladder wall thickening, or biliary dilatation. Pancreas: Unremarkable. No pancreatic ductal dilatation or surrounding inflammatory changes. Spleen: Normal in size without focal abnormality. Adrenals/Urinary Tract: There is mild left renal atrophy and scarring. There are prominent vascular calcifications in both kidneys. No definitive renal calculi seen. There is a rounded area in the inferior pole the right kidney measuring 14 mm with peripheral calcification which appears unchanged in size from prior MRI. There is no hydronephrosis. The adrenal glands and bladder are within normal  limits. Stomach/Bowel: Stomach is within normal limits. Appendix is not seen. No evidence of bowel wall thickening, distention, or inflammatory changes. There is sigmoid colon diverticulosis. Vascular/Lymphatic: Aortic atherosclerosis. No enlarged abdominal or pelvic lymph nodes. Reproductive: Prostate radiotherapy seeds are present Other: There are small fat containing inguinal hernias. There is no ascites. Musculoskeletal: Degenerative changes affect the spine. Sternotomy wires are present. IMPRESSION: 1. No acute localizing process in the abdomen or pelvis. 2. Stable right renal lesion with peripheral calcification. 3. Sigmoid colon diverticulosis. 4. Aortic atherosclerosis. Aortic Atherosclerosis (ICD10-I70.0). Electronically Signed   By: Darliss Cheney M.D.   On: 03/28/2023 22:57   DG Chest Port 1 View  Result Date: 03/28/2023 CLINICAL DATA:  Cough and weakness EXAM: PORTABLE CHEST 1 VIEW COMPARISON:  01/09/2022 FINDINGS: Generous left ventricular contour. Prior CABG. There is no edema, consolidation, effusion, or pneumothorax. IMPRESSION: No evidence of active disease. Electronically Signed   By: Tiburcio Pea M.D.   On: 03/28/2023 20:17      IMPRESSION AND PLAN:  Assessment and Plan: * Acute kidney injury superimposed on chronic kidney disease (HCC) - This is superimposed on  stage IIIa chronic kidney disease. - It is likely prerenal secondary to volume depletion and dehydration due to recurrent nausea and vomiting - The patient was admitted to a medical telemetry observation bed. - We will continue hydration with IV normal saline. - Will follow BMP. - We will avoid nephrotoxins.  Uncontrolled type 2 diabetes mellitus with hypoglycemia, with long-term current use of insulin (HCC) - The patient will be placed on supplemental coverage with NovoLog. - Will hold off basal coverage and glipizide given hypoglycemia. - Will also hold off metformin for now given his AKI.  Elevated troponin - This is likely demand ischemia/troponin leak. - We will follow serial troponins.  It has been trending down. - Cardiology consult will be obtained. - Dr. Diona Browner can be notified in AM.  Essential hypertension - Will continue amlodipine.  Dyslipidemia - We will continue statin therapy.  GERD without esophagitis - Continue PPI therapy.  Depression - We will continue Zoloft, trazodone, Remeron and Risperdal.   DVT prophylaxis: Lovenox. Advanced Care Planning:  Code Status: full code. Family Communication:  The plan of care was discussed in details with the patient (and family). I answered all questions. The patient agreed to proceed with the above mentioned plan. Further management will depend upon hospital course. Disposition Plan: Back to previous home environment Consults called: none. All the records are reviewed and case discussed with ED provider.  Status is: Observation  I certify that at the time of admission, it is my clinical judgment that the patient will require  hospital care extending less than 2 midnights.                            Dispo: The patient is from: Home              Anticipated d/c is to: Home              Patient currently is not medically stable to d/c.              Difficult to place patient: No  Hannah Beat M.D on 03/29/2023 at 1:25  AM  [facility]   From 7 PM-7 AM, contact night-coverage [email or web address]  CC: Primary care physician; Tommie Sams, DO

## 2023-03-28 NOTE — ED Notes (Signed)
Pt very HOH Stated he does not like to wear his hearing aides since they cause him more anxiety  BGL in triage 96

## 2023-03-28 NOTE — ED Triage Notes (Signed)
Nausea Since before Thanksgiving Pt denies ABD pain Denies vomiting and diarrhea Stated nausea comes and goes and complains of BGL's going up and down.  BLG range at home 35-240  EMS BGL 93  Pt complains of weakness and anxiety in triage

## 2023-03-29 ENCOUNTER — Other Ambulatory Visit (HOSPITAL_COMMUNITY): Payer: Self-pay | Admitting: *Deleted

## 2023-03-29 ENCOUNTER — Observation Stay (HOSPITAL_COMMUNITY): Payer: No Typology Code available for payment source

## 2023-03-29 ENCOUNTER — Ambulatory Visit: Payer: Self-pay | Admitting: *Deleted

## 2023-03-29 ENCOUNTER — Encounter (HOSPITAL_COMMUNITY): Payer: Self-pay | Admitting: Family Medicine

## 2023-03-29 DIAGNOSIS — I25119 Atherosclerotic heart disease of native coronary artery with unspecified angina pectoris: Secondary | ICD-10-CM | POA: Diagnosis not present

## 2023-03-29 DIAGNOSIS — R7989 Other specified abnormal findings of blood chemistry: Secondary | ICD-10-CM | POA: Diagnosis not present

## 2023-03-29 DIAGNOSIS — N179 Acute kidney failure, unspecified: Principal | ICD-10-CM | POA: Diagnosis present

## 2023-03-29 DIAGNOSIS — I700 Atherosclerosis of aorta: Secondary | ICD-10-CM | POA: Diagnosis present

## 2023-03-29 DIAGNOSIS — K219 Gastro-esophageal reflux disease without esophagitis: Secondary | ICD-10-CM | POA: Diagnosis present

## 2023-03-29 DIAGNOSIS — R112 Nausea with vomiting, unspecified: Secondary | ICD-10-CM | POA: Diagnosis present

## 2023-03-29 DIAGNOSIS — I2510 Atherosclerotic heart disease of native coronary artery without angina pectoris: Secondary | ICD-10-CM | POA: Diagnosis present

## 2023-03-29 DIAGNOSIS — I440 Atrioventricular block, first degree: Secondary | ICD-10-CM | POA: Diagnosis present

## 2023-03-29 DIAGNOSIS — I2489 Other forms of acute ischemic heart disease: Secondary | ICD-10-CM

## 2023-03-29 DIAGNOSIS — E785 Hyperlipidemia, unspecified: Secondary | ICD-10-CM | POA: Diagnosis present

## 2023-03-29 DIAGNOSIS — Z87891 Personal history of nicotine dependence: Secondary | ICD-10-CM | POA: Diagnosis not present

## 2023-03-29 DIAGNOSIS — F32A Depression, unspecified: Secondary | ICD-10-CM | POA: Diagnosis present

## 2023-03-29 DIAGNOSIS — Z8249 Family history of ischemic heart disease and other diseases of the circulatory system: Secondary | ICD-10-CM | POA: Diagnosis not present

## 2023-03-29 DIAGNOSIS — E1122 Type 2 diabetes mellitus with diabetic chronic kidney disease: Secondary | ICD-10-CM | POA: Diagnosis present

## 2023-03-29 DIAGNOSIS — Z951 Presence of aortocoronary bypass graft: Secondary | ICD-10-CM | POA: Diagnosis not present

## 2023-03-29 DIAGNOSIS — Z794 Long term (current) use of insulin: Secondary | ICD-10-CM

## 2023-03-29 DIAGNOSIS — I129 Hypertensive chronic kidney disease with stage 1 through stage 4 chronic kidney disease, or unspecified chronic kidney disease: Secondary | ICD-10-CM | POA: Diagnosis present

## 2023-03-29 DIAGNOSIS — Z79899 Other long term (current) drug therapy: Secondary | ICD-10-CM | POA: Diagnosis not present

## 2023-03-29 DIAGNOSIS — D649 Anemia, unspecified: Secondary | ICD-10-CM | POA: Diagnosis present

## 2023-03-29 DIAGNOSIS — N1831 Chronic kidney disease, stage 3a: Secondary | ICD-10-CM | POA: Diagnosis present

## 2023-03-29 DIAGNOSIS — Z8546 Personal history of malignant neoplasm of prostate: Secondary | ICD-10-CM | POA: Diagnosis not present

## 2023-03-29 DIAGNOSIS — E860 Dehydration: Secondary | ICD-10-CM | POA: Diagnosis present

## 2023-03-29 DIAGNOSIS — Z7984 Long term (current) use of oral hypoglycemic drugs: Secondary | ICD-10-CM | POA: Diagnosis not present

## 2023-03-29 DIAGNOSIS — E11649 Type 2 diabetes mellitus with hypoglycemia without coma: Secondary | ICD-10-CM | POA: Diagnosis present

## 2023-03-29 DIAGNOSIS — N189 Chronic kidney disease, unspecified: Secondary | ICD-10-CM | POA: Diagnosis not present

## 2023-03-29 DIAGNOSIS — E1165 Type 2 diabetes mellitus with hyperglycemia: Secondary | ICD-10-CM | POA: Diagnosis present

## 2023-03-29 DIAGNOSIS — Z955 Presence of coronary angioplasty implant and graft: Secondary | ICD-10-CM | POA: Diagnosis not present

## 2023-03-29 DIAGNOSIS — K5090 Crohn's disease, unspecified, without complications: Secondary | ICD-10-CM | POA: Diagnosis present

## 2023-03-29 DIAGNOSIS — Z7982 Long term (current) use of aspirin: Secondary | ICD-10-CM | POA: Diagnosis not present

## 2023-03-29 LAB — BASIC METABOLIC PANEL
Anion gap: 9 (ref 5–15)
BUN: 44 mg/dL — ABNORMAL HIGH (ref 8–23)
CO2: 18 mmol/L — ABNORMAL LOW (ref 22–32)
Calcium: 8.5 mg/dL — ABNORMAL LOW (ref 8.9–10.3)
Chloride: 111 mmol/L (ref 98–111)
Creatinine, Ser: 2.86 mg/dL — ABNORMAL HIGH (ref 0.61–1.24)
GFR, Estimated: 22 mL/min — ABNORMAL LOW (ref >60)
Glucose, Bld: 59 mg/dL — ABNORMAL LOW (ref 70–99)
Potassium: 4.1 mmol/L (ref 3.5–5.1)
Sodium: 138 mmol/L (ref 135–145)

## 2023-03-29 LAB — ECHOCARDIOGRAM COMPLETE
Area-P 1/2: 3.99 cm2
Height: 71 in
P 1/2 time: 490 ms
S' Lateral: 2.8 cm
Weight: 2691.38 [oz_av]

## 2023-03-29 LAB — GLUCOSE, CAPILLARY
Glucose-Capillary: 83 mg/dL (ref 70–99)
Glucose-Capillary: 126 mg/dL — ABNORMAL HIGH (ref 70–99)
Glucose-Capillary: 86 mg/dL (ref 70–99)

## 2023-03-29 LAB — CBC
HCT: 29.2 % — ABNORMAL LOW (ref 39.0–52.0)
Hemoglobin: 9.6 g/dL — ABNORMAL LOW (ref 13.0–17.0)
MCH: 32.7 pg (ref 26.0–34.0)
MCHC: 32.9 g/dL (ref 30.0–36.0)
MCV: 99.3 fL (ref 80.0–100.0)
Platelets: 150 10*3/uL (ref 150–400)
RBC: 2.94 MIL/uL — ABNORMAL LOW (ref 4.22–5.81)
RDW: 13.4 % (ref 11.5–15.5)
WBC: 9.4 10*3/uL (ref 4.0–10.5)
nRBC: 0 % (ref 0.0–0.2)

## 2023-03-29 LAB — HEMOGLOBIN A1C
Hgb A1c MFr Bld: 5.4 % (ref 4.8–5.6)
Mean Plasma Glucose: 108.28 mg/dL

## 2023-03-29 MED ORDER — ASPIRIN 81 MG PO TBEC
81.0000 mg | DELAYED_RELEASE_TABLET | Freq: Every day | ORAL | Status: DC
  Administered 2023-03-29 – 2023-03-30 (×2): 81 mg via ORAL
  Filled 2023-03-29 (×2): qty 1

## 2023-03-29 MED ORDER — HALOPERIDOL LACTATE 5 MG/ML IJ SOLN
2.0000 mg | Freq: Four times a day (QID) | INTRAMUSCULAR | Status: DC | PRN
  Administered 2023-03-29: 2 mg via INTRAVENOUS
  Filled 2023-03-29: qty 1

## 2023-03-29 MED ORDER — PANTOPRAZOLE SODIUM 40 MG PO TBEC
40.0000 mg | DELAYED_RELEASE_TABLET | Freq: Every day | ORAL | Status: DC
  Administered 2023-03-29 – 2023-03-30 (×2): 40 mg via ORAL
  Filled 2023-03-29 (×2): qty 1

## 2023-03-29 MED ORDER — PROPRANOLOL HCL 10 MG PO TABS
40.0000 mg | ORAL_TABLET | Freq: Once | ORAL | Status: AC
  Administered 2023-03-29: 40 mg via ORAL
  Filled 2023-03-29: qty 4

## 2023-03-29 MED ORDER — ALPRAZOLAM 0.5 MG PO TABS
0.5000 mg | ORAL_TABLET | Freq: Three times a day (TID) | ORAL | Status: DC | PRN
  Administered 2023-03-29: 0.5 mg via ORAL
  Filled 2023-03-29: qty 1

## 2023-03-29 MED ORDER — INSULIN ASPART 100 UNIT/ML IJ SOLN: 0.0000 [IU] | Freq: Three times a day (TID) | INTRAMUSCULAR | Status: DC

## 2023-03-29 NOTE — ED Notes (Signed)
ED TO INPATIENT HANDOFF REPORT  ED Nurse Name and Phone #:  Joneen Roach RN   S Name/Age/Gender [name] 77 y.o. male Room/Bed: APA11/APA11  Code Status   Code Status: Full Code  Home/SNF/Other Home Patient oriented to: self, place, time, and situation Is this baseline? Yes   Triage Complete: Triage complete  Chief Complaint AKI (acute kidney injury) (HCC) [N17.9]  Triage Note Nausea Since before Thanksgiving Pt denies ABD pain Denies vomiting and diarrhea Stated nausea comes and goes and complains of BGL's going up and down.  BLG range at home 35-240  EMS BGL 93  Pt complains of weakness and anxiety in triage    Allergies Allergies  Allergen Reactions   Buprenorphine Other (See Comments)   Buprenorphine Hcl Other (See Comments)   Flomax [Tamsulosin Hcl]     UNSPECIFIED REACTION to HIGH DOSE    Morphine And Codeine     UNSPECIFIED REACTION     Level of Care/Admitting Diagnosis ED Disposition     ED Disposition  Admit   Condition  --   Comment  Hospital Area: [facility] [[id number]]  Level of Care: Telemetry [5]  Covid Evaluation: Asymptomatic - no recent exposure (last 10 days) testing not required  Diagnosis: AKI (acute kidney injury) [facility]) [[id number]]  Admitting Physician: Hannah Beat [[id number]]  Attending Physician: Hannah Beat [[id number]]          B Medical/Surgery History Past Medical History:  Diagnosis Date   Adenocarcinoma of prostate (HCC)    Anxiety    CAD (coronary artery disease)    a. s/p prior stenting of LAD in 1996 and angioplasty alone in 1999 to LAD by review of prior notes.    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    Crohn's disease (HCC)    CTS (carpal tunnel syndrome)    Mild   Depression    Diabetes mellitus without complication (HCC)    Type 2   Hyperlipidemia    Hypertension    Kidney stone    Optic neuritis    Past Surgical History:  Procedure Laterality Date   COLONOSCOPY     CORONARY ARTERY BYPASS GRAFT  N/A 04/01/2018   Procedure: CORONARY ARTERY BYPASS GRAFTING (CABG) TIMES FOUR: (LIMA to LAD, CRYO VEIN to OM1, SVG to OM2, and SVG to PDA) with PARTIAL EVH/OPEN from RIGHT and LEFT GREATER SAPHENOUS VEIN and LEFT INTERNAL MAMMARY ARTERY;  Surgeon: Kerin Perna, MD;  Location: [facility] OR;  Service: Open Heart Surgery;  Laterality: N/A;   KNEE CARTILAGE SURGERY Right    LEFT HEART CATH AND CORONARY ANGIOGRAPHY N/A 03/27/2018   Procedure: LEFT HEART CATH AND CORONARY ANGIOGRAPHY;  Surgeon: Lennette Bihari, MD;  Location: [facility] INVASIVE CV LAB;  Service: Cardiovascular;  Laterality: N/A;   LOWER EXTREMITY ANGIOGRAPHY N/A 08/13/2016   Procedure: Lower Extremity Angiography;  Surgeon: Runell Gess, MD;  Location: [facility] INVASIVE CV LAB;  Service: Cardiovascular;  Laterality: N/A;   NASAL SINUS SURGERY     PROSTATE SURGERY     TEE WITHOUT CARDIOVERSION N/A 04/01/2018   Procedure: TRANSESOPHAGEAL ECHOCARDIOGRAM (TEE);  Surgeon: Donata Clay, Theron Arista, MD;  Location: [facility] OR;  Service: Open Heart Surgery;  Laterality: N/A;     A IV Location/Drains/Wounds Patient Lines/Drains/Airways Status     Active Line/Drains/Airways     Name Placement date Placement time Site Days   Peripheral IV 03/28/23 22 G Anterior;Right Forearm 03/28/23  1930  Forearm  1  Intake/Output Last 24 hours  Intake/Output Summary (Last 24 hours) at 03/29/2023 0037 Last data filed at 03/28/2023 2217 Gross per 24 hour  Intake 1042.38 ml  Output --  Net 1042.38 ml    Labs/Imaging Results for orders placed or performed during the hospital encounter of 03/28/23 (from the past 48 hour(s))  POC CBG, ED     Status: None   Collection Time: 03/28/23  2:53 PM  Result Value Ref Range   Glucose-Capillary 96 70 - 99 mg/dL    Comment: Glucose reference range applies only to samples taken after fasting for at least 8 hours.  CBC with Differential     Status: Abnormal   Collection Time: 03/28/23  3:34 PM  Result Value Ref Range    WBC 10.3 4.0 - 10.5 K/uL   RBC 3.54 (L) 4.22 - 5.81 MIL/uL   Hemoglobin 10.9 (L) 13.0 - 17.0 g/dL   HCT 86.5 (L) 78.4 - 69.6 %   MCV 99.2 80.0 - 100.0 fL   MCH 30.8 26.0 - 34.0 pg   MCHC 31.1 30.0 - 36.0 g/dL   RDW 29.5 28.4 - 13.2 %   Platelets 176 150 - 400 K/uL   nRBC 0.0 0.0 - 0.2 %   Neutrophils Relative % 82 %   Neutro Abs 8.4 (H) 1.7 - 7.7 K/uL   Lymphocytes Relative 10 %   Lymphs Abs 1.0 0.7 - 4.0 K/uL   Monocytes Relative 7 %   Monocytes Absolute 0.7 0.1 - 1.0 K/uL   Eosinophils Relative 1 %   Eosinophils Absolute 0.1 0.0 - 0.5 K/uL   Basophils Relative 0 %   Basophils Absolute 0.0 0.0 - 0.1 K/uL   Immature Granulocytes 0 %   Abs Immature Granulocytes 0.03 0.00 - 0.07 K/uL    Comment: Performed at [facility], [address], [place], [place] [address]  Comprehensive metabolic panel     Status: Abnormal   Collection Time: 03/28/23  3:34 PM  Result Value Ref Range   Sodium 137 135 - 145 mmol/L   Potassium 3.8 3.5 - 5.1 mmol/L   Chloride 109 98 - 111 mmol/L   CO2 16 (L) 22 - 32 mmol/L   Glucose, Bld 111 (H) 70 - 99 mg/dL    Comment: Glucose reference range applies only to samples taken after fasting for at least 8 hours.   BUN 43 (H) 8 - 23 mg/dL   Creatinine, Ser 2.72 (H) 0.61 - 1.24 mg/dL   Calcium 9.2 8.9 - 53.6 mg/dL   Total Protein 7.3 6.5 - 8.1 g/dL   Albumin 3.5 3.5 - 5.0 g/dL   AST 19 15 - 41 U/L   ALT 22 0 - 44 U/L   Alkaline Phosphatase 96 38 - 126 U/L   Total Bilirubin 0.4 <1.2 mg/dL   GFR, Estimated 20 (L) >60 mL/min    Comment: (NOTE) Calculated using the CKD-EPI Creatinine Equation (2021)    Anion gap 12 5 - 15    Comment: Performed at [facility], [address] [place], [place] [address]  Blood gas, venous (at [facility] and [facility])     Status: Abnormal   Collection Time: 03/28/23  3:34 PM  Result Value Ref Range   pH, Ven 7.24 (L) 7.25 - 7.43   pCO2, Ven 43 (L) 44 - 60 mmHg   pO2, Ven <31 (LL) 32 - 45 mmHg    Comment: CRITICAL RESULT CALLED TO, READ  BACK BY AND VERIFIED WITH: FOWLER,D ON 03/28/23 AT  1552 BY LOY,C    Bicarbonate 18.4 (L) 20.0 - 28.0 mmol/L   Acid-base deficit 8.8 (H) 0.0 - 2.0 mmol/L   O2 Saturation 23.9 %   Patient temperature 36.6    Collection site LEFT ANTECUBITAL    Drawn by [id number]     Comment: Performed at [facility], [address], [place], [place] [address]  Lactic acid, plasma     Status: None   Collection Time: 03/28/23  7:20 PM  Result Value Ref Range   Lactic Acid, Venous 1.7 0.5 - 1.9 mmol/L    Comment: Performed at [facility], [address] [place], [place] [address]  Troponin I (High Sensitivity)     Status: Abnormal   Collection Time: 03/28/23  7:20 PM  Result Value Ref Range   Troponin I (High Sensitivity) 280 (HH) <18 ng/L    Comment: CRITICAL RESULT CALLED TO, READ BACK BY AND VERIFIED WITH JASPER,H ON 03/28/23 AT 2025 BY LOY,C (NOTE) Elevated high sensitivity troponin I (hsTnI) values and significant  changes across serial measurements may suggest ACS but many other  chronic and acute conditions are known to elevate hsTnI results.  Refer to the "Links" section for chest pain algorithms and additional  guidance. Performed at [facility], [address], [place], [place] [address]   Urinalysis, Routine w reflex microscopic -Urine, Clean Catch     Status: Abnormal   Collection Time: 03/28/23  8:18 PM  Result Value Ref Range   Color, Urine YELLOW YELLOW   APPearance HAZY (A) CLEAR   Specific Gravity, Urine 1.014 1.005 - 1.030   pH 5.0 5.0 - 8.0   Glucose, UA NEGATIVE NEGATIVE mg/dL   Hgb urine dipstick NEGATIVE NEGATIVE   Bilirubin Urine NEGATIVE NEGATIVE   Ketones, ur NEGATIVE NEGATIVE mg/dL   Protein, ur 696 (A) NEGATIVE mg/dL   Nitrite NEGATIVE NEGATIVE   Leukocytes,Ua NEGATIVE NEGATIVE   RBC / HPF 0-5 0 - 5 RBC/hpf   WBC, UA 0-5 0 - 5 WBC/hpf   Bacteria, UA RARE (A) NONE SEEN   Squamous Epithelial / HPF 0-5 0 - 5 /HPF   Mucus PRESENT    Hyaline Casts, UA PRESENT     Comment:  Performed at [facility], [address], [place], [place] [address]  Troponin I (High Sensitivity)     Status: Abnormal   Collection Time: 03/28/23  9:04 PM  Result Value Ref Range   Troponin I (High Sensitivity) 199 (HH) <18 ng/L    Comment: CRITICAL RESULT CALLED TO, READ BACK BY AND VERIFIED WITH E. Daegon Deiss AT 2148 ON 12.05.24 BY ADGER J DELTA CHECK NOTED (NOTE) Elevated high sensitivity troponin I (hsTnI) values and significant  changes across serial measurements may suggest ACS but many other  chronic and acute conditions are known to elevate hsTnI results.  Refer to the "Links" section for chest pain algorithms and additional  guidance. Performed at [facility], [address] [place], [place] [address]    CT ABDOMEN PELVIS WO CONTRAST  Result Date: 03/28/2023 CLINICAL DATA:  Abdominal pain.  Nausea. EXAM: CT ABDOMEN AND PELVIS WITHOUT CONTRAST TECHNIQUE: Multidetector CT imaging of the abdomen and pelvis was performed following the standard protocol without IV contrast. RADIATION DOSE REDUCTION: This exam was performed according to the departmental dose-optimization program which includes automated exposure control, adjustment of the mA and/or kV according to patient size and/or use of iterative reconstruction technique. COMPARISON:  MRI abdomen 06/08/2022. FINDINGS: Lower chest: No acute abnormality. Hepatobiliary: No focal  liver abnormality is seen. No gallstones, gallbladder wall thickening, or biliary dilatation. Pancreas: Unremarkable. No pancreatic ductal dilatation or surrounding inflammatory changes. Spleen: Normal in size without focal abnormality. Adrenals/Urinary Tract: There is mild left renal atrophy and scarring. There are prominent vascular calcifications in both kidneys. No definitive renal calculi seen. There is a rounded area in the inferior pole the right kidney measuring 14 mm with peripheral calcification which appears unchanged in size from prior MRI. There is no  hydronephrosis. The adrenal glands and bladder are within normal limits. Stomach/Bowel: Stomach is within normal limits. Appendix is not seen. No evidence of bowel wall thickening, distention, or inflammatory changes. There is sigmoid colon diverticulosis. Vascular/Lymphatic: Aortic atherosclerosis. No enlarged abdominal or pelvic lymph nodes. Reproductive: Prostate radiotherapy seeds are present Other: There are small fat containing inguinal hernias. There is no ascites. Musculoskeletal: Degenerative changes affect the spine. Sternotomy wires are present. IMPRESSION: 1. No acute localizing process in the abdomen or pelvis. 2. Stable right renal lesion with peripheral calcification. 3. Sigmoid colon diverticulosis. 4. Aortic atherosclerosis. Aortic Atherosclerosis (ICD10-I70.0). Electronically Signed   By: Darliss Cheney M.D.   On: 03/28/2023 22:57   DG Chest Port 1 View  Result Date: 03/28/2023 CLINICAL DATA:  Cough and weakness EXAM: PORTABLE CHEST 1 VIEW COMPARISON:  01/09/2022 FINDINGS: Generous left ventricular contour. Prior CABG. There is no edema, consolidation, effusion, or pneumothorax. IMPRESSION: No evidence of active disease. Electronically Signed   By: Tiburcio Pea M.D.   On: 03/28/2023 20:17    Pending Labs Unresulted Labs (From admission, onward)     Start     Ordered   03/29/23 0500  Basic metabolic panel  Tomorrow morning,   R        03/28/23 2352   03/29/23 0500  CBC  Tomorrow morning,   R        03/28/23 2352            Vitals/Pain Today's Vitals   03/29/23 0012 03/29/23 0016 03/29/23 0020 03/29/23 0030  BP: (!) 169/71     Pulse: 90  82 84  Resp:   (!) 23 (!) 21  Temp:  97.8 F (36.6 C)    TempSrc:  Oral    SpO2:   98% 99%  Weight:      Height:      PainSc:        Isolation Precautions No active isolations  Medications Medications  propranolol (INDERAL) tablet 40 mg (has no administration in time range)  rosuvastatin (CRESTOR) tablet 20 mg (has no  administration in time range)  donepezil (ARICEPT) tablet 5 mg (has no administration in time range)  mirtazapine (REMERON) tablet 7.5 mg (has no administration in time range)  risperiDONE (RISPERDAL) tablet 0.5 mg (0.5 mg Oral Given 03/29/23 0012)  sertraline (ZOLOFT) tablet 150 mg (has no administration in time range)  mesalamine (LIALDA) EC tablet 1.2 g (has no administration in time range)  Cholecalciferol CHEW 25 Units (has no administration in time range)  enoxaparin (LOVENOX) injection 30 mg (has no administration in time range)  0.9 %  sodium chloride infusion ( Intravenous New Bag/Given 03/29/23 0015)  acetaminophen (TYLENOL) tablet 650 mg (has no administration in time range)    Or  acetaminophen (TYLENOL) suppository 650 mg (has no administration in time range)  traZODone (DESYREL) tablet 25 mg (has no administration in time range)  magnesium hydroxide (MILK OF MAGNESIA) suspension 30 mL (has no administration in time range)  ondansetron (ZOFRAN) tablet 4 mg (  has no administration in time range)    Or  ondansetron (ZOFRAN) injection 4 mg (has no administration in time range)  albuterol (PROVENTIL) (2.5 MG/3ML) 0.083% nebulizer solution 2.5 mg (has no administration in time range)  sodium chloride 0.9 % bolus 1,000 mL (0 mLs Intravenous Stopped 03/28/23 2217)  ondansetron (ZOFRAN) injection 4 mg (4 mg Intravenous Given 03/28/23 1936)  famotidine (PEPCID) IVPB 20 mg premix (0 mg Intravenous Stopped 03/28/23 2021)  propranolol (INDERAL) tablet 40 mg (40 mg Oral Given 03/29/23 0012)    Mobility walks with person assist     Focused Assessments Cardiac Assessment Handoff:  Cardiac Rhythm: Normal sinus rhythm Lab Results  Component Value Date   TROPONINI 0.03 (HH) 03/26/2018    Does the Patient currently have chest pain? No    R Recommendations: See Admitting Provider Note  Report given to:   Additional Notes:

## 2023-03-29 NOTE — Progress Notes (Addendum)
PROGRESS NOTE    Patient: [name]                            PCP: Tommie Sams, DO                    DOB: 05-02-1945            DOA: 03/28/2023 MWN:[id number]             DOS: 03/29/2023, 2:32 PM   LOS: 0 days   Date of Service: The patient was seen and examined on 03/29/2023  Subjective:   The patient was seen and examined this morning. Hemodynamically stable. Denies any chest pain or shortness of breath, complaining of generalized weaknesses. No further nausea or vomiting overnight No issues overnight .  Brief Narrative:   [name] is a 77 y.o. Caucasian male with medical history significant for coronary artery disease status post PCI and stents, Crohn's disease, anxiety, depression, type 2 diabetes mellitus, hypertension and dyslipidemia, who presented to the emergency room with acute onset of recurrent nausea and vomiting for the last 1 to 2 weeks.  He admitted to occasional loose stools.  No abdominal pain or melena or bright red bleeding per rectum.  No fever or chills.  He has been having mild cough without wheezing or dyspnea.  He has been feeling generally weak and dizzy.  His blood glucose has been dropping to the 30s.  He has been showing uncaring the [place] has not been having much appetite lately.  He continued to take his diabetic medications.  No dysuria, oliguria or hematuria or flank pain.    ED Course: BP was 198/81, RR of 23 with otherwise normal vital signs.  Blood gas revealed pH of 7.24 and HCO3 18.4 and CMP revealed a BUN of 43 with a creatinine of 3.03 compared to 42/2.04 on 01/21/2023.  High sensitive troponin I was 280 and later 199.  CBC showed anemia with hemoglobin 10.9 and 35.1 down from 12.3 and 38.2.  Lactic acid was 1.7.  UA was unremarkable. EKG as reviewed by me :  EKG showed normal sinus rhythm with a rate of 86 with first-degree AV block and LVH with repolarization abnormality and T wave inversion laterally. Imaging: Portable chest  x-ray showed no acute cardiopulmonary disease.   The patient was given 4 mg of IV Zofran and 20 mg of IV Pepcid as well as 1 L bolus of IV normal saline.  The case was discussed with cardiology at [facility] and the patient was thought to have troponin leak and that this is not likely cardiac given lack of chest pain.    Assessment & Plan:   Principal Problem:   Acute kidney injury superimposed on chronic kidney disease (HCC) Active Problems:   Uncontrolled type 2 diabetes mellitus with hypoglycemia, with long-term current use of insulin (HCC)   Essential hypertension   Elevated troponin   Dyslipidemia   Depression   GERD without esophagitis   Assessment and Plan:  * Acute kidney injury superimposed on chronic kidney disease stage IIIA  -Exacerbated by dehydration, volume depletion, due to nausea and vomiting  - We will continue hydration with IV normal saline. - Will follow BMP. - We will avoid nephrotoxins.   Uncontrolled type 2 diabetes mellitus with hypoglycemia, with long-term current use of insulin (HCC) - Will check CBG q. ACHS, SSI coverage NovoLog - Will hold off basal  coverage and glipizide given hypoglycemia. - Will also hold off metformin for now given his AKI.   Elevated troponin - Denies any chest pain,  likely demand ischemia/troponin leak. - Troponin 280>> 199 -trending down - Cardiology consulted, following - Dr. Diona Browner  -2D echocardiogram:  EJF 55-60%, grade II diastolic dysfunction    Essential hypertension - Continue amlodipine.   Dyslipidemia - We will continue statin    GERD without esophagitis - Continue PPI therapy.   Depression - We will continue Zoloft, trazodone, Remeron and Risperdal.      -------------------------------------------------------------------------------------------------------------------------------------------- Nutritional status:  The patient's BMI is: Body mass index is 23.46 kg/m. I agree with the assessment and  plan as outlined below: Nutrition Status:          --------------------------------------------------------------------------------------------------------------------------------------------  DVT prophylaxis:  enoxaparin (LOVENOX) injection 30 mg Start: 03/29/23 1000   Code Status:   Code Status: Full Code  Family Communication: No family member present at bedside- attempt will be made to update daily  -Advance care planning has been discussed.   Admission status:   Status is: Observation The patient remains OBS appropriate and will d/c before 2 midnights.   Disposition: From  - home             Planning for discharge in 1-2 days: to   Procedures:   No admission procedures for hospital encounter.   Antimicrobials:  Anti-infectives (From admission, onward)    None        Medication:   aspirin EC  81 mg Oral Daily   cholecalciferol  1,000 Units Oral Daily   donepezil  5 mg Oral QHS   enoxaparin (LOVENOX) injection  30 mg Subcutaneous Q24H   insulin aspart  0-6 Units Subcutaneous TID WC   mesalamine  1.2 g Oral BID WC   mirtazapine  7.5 mg Oral QHS   pantoprazole  40 mg Oral Daily   propranolol  40 mg Oral BID   risperiDONE  0.5 mg Oral BID   rosuvastatin  20 mg Oral Daily   sertraline  150 mg Oral Daily    acetaminophen **OR** acetaminophen, albuterol, ondansetron **OR** ondansetron (ZOFRAN) IV, traZODone   Objective:   Vitals:   03/29/23 0143 03/29/23 0603 03/29/23 1224 03/29/23 1403  BP: 115/76 (!) 142/59 127/66 117/70  Pulse: 70 66 66 (!) 54  Resp: 16 16    Temp: 97.7 F (36.5 C) 98.1 F (36.7 C)  97.6 F (36.4 C)  TempSrc: Oral Oral  Oral  SpO2: 97% 100%  92%  Weight: 76.3 kg     Height: 5\' 11"  (1.803 m)       Intake/Output Summary (Last 24 hours) at 03/29/2023 1432 Last data filed at 03/29/2023 1308 Gross per 24 hour  Intake 1722.44 ml  Output 400 ml  Net 1322.44 ml   Filed Weights   03/28/23 1453 03/29/23 0143  Weight: 77.1 kg  76.3 kg     Physical examination:   Constitution:  Alert, cooperative, no distress,  Appears calm and comfortable  Psychiatric:   Normal and stable mood and affect, cognition intact,   HEENT:        Normocephalic, PERRL, otherwise with in Normal limits  Chest:         Chest symmetric Cardio vascular:  S1/S2, RRR, No murmure, No Rubs or Gallops  pulmonary: Clear to auscultation bilaterally, respirations unlabored, negative wheezes / crackles Abdomen: Soft, non-tender, non-distended, bowel sounds,no masses, no organomegaly Muscular skeletal: Limited exam - in bed, able to  move all 4 extremities,   Neuro: CNII-XII intact. , normal motor and sensation, reflexes intact  Extremities: No pitting edema lower extremities, +2 pulses  Skin: Dry, warm to touch, negative for any Rashes, No open wounds Wounds: per nursing documentation   ------------------------------------------------------------------------------------------------------------------------------------------    LABs:     Latest Ref Rng & Units 03/29/2023    4:10 AM 03/28/2023    3:34 PM 01/21/2023    3:12 PM  CBC  WBC 4.0 - 10.5 K/uL 9.4  10.3  12.4   Hemoglobin 13.0 - 17.0 g/dL 9.6  40.9  81.1   Hematocrit 39.0 - 52.0 % 29.2  35.1  38.2   Platelets 150 - 400 K/uL 150  176  205       Latest Ref Rng & Units 03/29/2023    4:10 AM 03/28/2023    3:34 PM 01/21/2023    3:12 PM  CMP  Glucose 70 - 99 mg/dL 59  914  782   BUN 8 - 23 mg/dL 44  43  42   Creatinine 0.61 - 1.24 mg/dL 9.56  2.13  0.86   Sodium 135 - 145 mmol/L 138  137  143   Potassium 3.5 - 5.1 mmol/L 4.1  3.8  4.6   Chloride 98 - 111 mmol/L 111  109  109   CO2 22 - 32 mmol/L 18  16  19    Calcium 8.9 - 10.3 mg/dL 8.5  9.2  9.3   Total Protein 6.5 - 8.1 g/dL  7.3  7.1   Total Bilirubin <1.2 mg/dL  0.4  0.3   Alkaline Phos 38 - 126 U/L  96  120   AST 15 - 41 U/L  19  18   ALT 0 - 44 U/L  22  13        Micro Results No results found for this or any previous  visit (from the past 240 hour(s)).  Radiology Reports ECHOCARDIOGRAM COMPLETE  Result Date: 03/29/2023    ECHOCARDIOGRAM REPORT   Patient Name:   [name] Date of Exam: 03/29/2023 Medical Rec #:  [id number]             Height:       71.0 in Accession #:    [id number]            Weight:       168.2 lb Date of Birth:  11/29/1945            BSA:          1.959 m Patient Age:    77 years              BP:           142/59 mmHg Patient Gender: M                     HR:           72 bpm. [facility] Procedure: 2D Echo, Cardiac Doppler and Color Doppler Indications:    Elevated Troponin  History:        Patient has prior history of Echocardiogram examinations, most                 recent 01/10/2022. CAD, Prior CABG; Risk Factors:Hypertension,                 Diabetes and Current Smoker.  Sonographer:    Celesta Gentile RCS Referring Phys: [id number] Ellsworth Lennox  IMPRESSIONS  1. Left ventricular ejection fraction, by estimation, is 55 to 60%. The left ventricle has normal function. Left ventricular endocardial border not optimally defined to evaluate regional wall motion. There is mild asymmetric left ventricular hypertrophy  of the basal segment. Left ventricular diastolic parameters are consistent with Grade II diastolic dysfunction (pseudonormalization).  2. Right ventricular systolic function is normal. The right ventricular size is normal. Tricuspid regurgitation signal is inadequate for assessing PA pressure.  3. Left atrial size was mild to moderately dilated.  4. The mitral valve is degenerative. Trivial mitral valve regurgitation.  5. The aortic valve is tricuspid. There is mild calcification of the aortic valve. Aortic valve regurgitation is mild. Aortic valve sclerosis/calcification is present, without any evidence of aortic stenosis. Aortic regurgitation PHT measures 490 msec.  6. The inferior vena cava is normal in size with greater than 50% respiratory variability, suggesting  right atrial pressure of 3 mmHg. Comparison(s): Prior images reviewed side by side. LVEF remains normal range at 55-60%. FINDINGS  Left Ventricle: Left ventricular ejection fraction, by estimation, is 55 to 60%. The left ventricle has normal function. Left ventricular endocardial border not optimally defined to evaluate regional wall motion. The left ventricular internal cavity size was normal in size. There is mild asymmetric left ventricular hypertrophy of the basal segment. Left ventricular diastolic parameters are consistent with Grade II diastolic dysfunction (pseudonormalization). Right Ventricle: The right ventricular size is normal. No increase in right ventricular wall thickness. Right ventricular systolic function is normal. Tricuspid regurgitation signal is inadequate for assessing PA pressure. Left Atrium: Left atrial size was mild to moderately dilated. Right Atrium: Right atrial size was normal in size. Pericardium: There is no evidence of pericardial effusion. Mitral Valve: The mitral valve is degenerative in appearance. There is mild calcification of the mitral valve leaflet(s). Mild mitral annular calcification. Trivial mitral valve regurgitation. Tricuspid Valve: The tricuspid valve is grossly normal. Tricuspid valve regurgitation is trivial. Aortic Valve: The aortic valve is tricuspid. There is mild calcification of the aortic valve. Aortic valve regurgitation is mild. Aortic regurgitation PHT measures 490 msec. Aortic valve sclerosis/calcification is present, without any evidence of aortic stenosis. Pulmonic Valve: The pulmonic valve was grossly normal. Pulmonic valve regurgitation is trivial. Aorta: The aortic root is normal in size and structure. Venous: The inferior vena cava is normal in size with greater than 50% respiratory variability, suggesting right atrial pressure of 3 mmHg. IAS/Shunts: No atrial level shunt detected by color flow Doppler.  LEFT VENTRICLE PLAX 2D LVIDd:         4.20 cm    Diastology LVIDs:         2.80 cm   LV e' medial:    6.96 cm/s LV PW:         0.90 cm   LV E/e' medial:  18.5 LV IVS:        1.10 cm   LV e' lateral:   7.62 cm/s LVOT diam:     1.90 cm   LV E/e' lateral: 16.9 LV SV:         80 LV SV Index:   41 LVOT Area:     2.84 cm  RIGHT VENTRICLE RV S prime:     10.90 cm/s TAPSE (M-mode): 1.8 cm LEFT ATRIUM             Index        RIGHT ATRIUM           Index LA diam:  3.10 cm 1.58 cm/m   RA Area:     14.40 cm LA Vol (A2C):   94.4 ml 48.19 ml/m  RA Volume:   30.40 ml  15.52 ml/m LA Vol (A4C):   64.9 ml 33.13 ml/m LA Biplane Vol: 79.6 ml 40.63 ml/m  AORTIC VALVE LVOT Vmax:   130.00 cm/s LVOT Vmean:  88.300 cm/s LVOT VTI:    0.282 m AI PHT:      490 msec  AORTA Ao Root diam: 3.10 cm MITRAL VALVE MV Area (PHT): 3.99 cm     SHUNTS MV Decel Time: 190 msec     Systemic VTI:  0.28 m MV E velocity: 129.00 cm/s  Systemic Diam: 1.90 cm MV A velocity: 121.00 cm/s MV E/A ratio:  1.07 Nona Dell MD Electronically signed by Nona Dell MD Signature Date/Time: 03/29/2023/11:02:11 AM    Final    CT ABDOMEN PELVIS WO CONTRAST  Result Date: 03/28/2023 CLINICAL DATA:  Abdominal pain.  Nausea. EXAM: CT ABDOMEN AND PELVIS WITHOUT CONTRAST TECHNIQUE: Multidetector CT imaging of the abdomen and pelvis was performed following the standard protocol without IV contrast. RADIATION DOSE REDUCTION: This exam was performed according to the departmental dose-optimization program which includes automated exposure control, adjustment of the mA and/or kV according to patient size and/or use of iterative reconstruction technique. COMPARISON:  MRI abdomen 06/08/2022. FINDINGS: Lower chest: No acute abnormality. Hepatobiliary: No focal liver abnormality is seen. No gallstones, gallbladder wall thickening, or biliary dilatation. Pancreas: Unremarkable. No pancreatic ductal dilatation or surrounding inflammatory changes. Spleen: Normal in size without focal abnormality. Adrenals/Urinary  Tract: There is mild left renal atrophy and scarring. There are prominent vascular calcifications in both kidneys. No definitive renal calculi seen. There is a rounded area in the inferior pole the right kidney measuring 14 mm with peripheral calcification which appears unchanged in size from prior MRI. There is no hydronephrosis. The adrenal glands and bladder are within normal limits. Stomach/Bowel: Stomach is within normal limits. Appendix is not seen. No evidence of bowel wall thickening, distention, or inflammatory changes. There is sigmoid colon diverticulosis. Vascular/Lymphatic: Aortic atherosclerosis. No enlarged abdominal or pelvic lymph nodes. Reproductive: Prostate radiotherapy seeds are present Other: There are small fat containing inguinal hernias. There is no ascites. Musculoskeletal: Degenerative changes affect the spine. Sternotomy wires are present. IMPRESSION: 1. No acute localizing process in the abdomen or pelvis. 2. Stable right renal lesion with peripheral calcification. 3. Sigmoid colon diverticulosis. 4. Aortic atherosclerosis. Aortic Atherosclerosis (ICD10-I70.0). Electronically Signed   By: Darliss Cheney M.D.   On: 03/28/2023 22:57   DG Chest Port 1 View  Result Date: 03/28/2023 CLINICAL DATA:  Cough and weakness EXAM: PORTABLE CHEST 1 VIEW COMPARISON:  01/09/2022 FINDINGS: Generous left ventricular contour. Prior CABG. There is no edema, consolidation, effusion, or pneumothorax. IMPRESSION: No evidence of active disease. Electronically Signed   By: Tiburcio Pea M.D.   On: 03/28/2023 20:17    SIGNED: Kendell Bane, MD, FHM. FAAFP. [facility] - [facility] Time spent - 35 min.  In seeing, evaluating and examining the patient. Reviewing medical records, labs, drawn plan of care. [facility],  Pager (please use [email or web address] to page/ text) Please use Epic Secure Chat for non-urgent communication (7AM-7PM)  If 7PM-7AM, please contact  night-coverage [email or web address], 03/29/2023, 2:32 PM

## 2023-03-29 NOTE — Assessment & Plan Note (Addendum)
-   This is superimposed on stage IIIa chronic kidney disease. - It is likely prerenal secondary to volume depletion and dehydration due to recurrent nausea and vomiting - The patient was admitted to a medical telemetry observation bed. - We will continue hydration with IV normal saline. - Will follow BMP. - We will avoid nephrotoxins.

## 2023-03-29 NOTE — Assessment & Plan Note (Addendum)
-   The patient will be placed on supplemental coverage with NovoLog. - Will hold off basal coverage and glipizide given hypoglycemia. - Will also hold off metformin for now given his AKI.

## 2023-03-29 NOTE — Assessment & Plan Note (Signed)
-   We will continue statin therapy. 

## 2023-03-29 NOTE — Assessment & Plan Note (Signed)
-   The patient be placed on supplemental coverage with NovoLog. - We will hold off - We will resume Amaryl

## 2023-03-29 NOTE — Assessment & Plan Note (Signed)
Continue PPI therapy. 

## 2023-03-29 NOTE — Consult Note (Addendum)
Cardiology Consultation   Patient ID: TALTON ROEDL MRN: [id number]; DOB: 25-Apr-1945  Admit date: 03/28/2023 Date of Consult: 03/29/2023  PCP:  Tommie Sams, DO   [facility] Providers Cardiologist:  Peter Swaziland, MD        Patient Profile:   [name] is a 77 y.o. male with a hx of CAD (s/p prior interventions to LAD, s/p CABG in 03/2018 with LIMA-LAD, Cryo vein - OM1, SVG-OM2 and SVG-PDA), PAD (known bilateral SFA occlusions), HTN, HLD, Type 2 DM, Stage 3 CKD,  prostate cancer and GERD who is being seen 03/29/2023 for the evaluation of elevated troponin values at the request of Dr. Arville Care.  History of Present Illness:   Mr. Lastinger was last examined by Dr. Swaziland in 12/2019 and reported sleeping a lot during the day at that time and was not very active. He was continued on ASA, statin and Coreg.  Was informed to follow-up in a year but has not been evaluated by [facility] since. [facility].  He presented to [facility] ED on 03/28/2023 for evaluation of nausea and weakness for the past week. Also reported having episodes of hypoglycemia with glucose in the 30's at times.  In talking with the patient today, he reports a variety of issues over the past few weeks. Says that his food intake has been limited as he has nausea and vomiting with bland foods, even wheat toast. He feels like this becomes stuck and then he regurgitates the food. He denies a history of GERD but this is in his past medical history and he was on Prilosec by review of medications prior to admission. Says that he has occasional discomfort in his chest when this occurs but nothing resembling his prior cardiac pain. Breathing has been stable with no specific orthopnea, PND or lower extremity edema. Says he is mostly sedentary at the house and does not go out frequently. Says that he has been evaluated by Neurology at the [facility] and is unsure of an  exact diagnosis but has been told he is "not totally crazy but not normal either". By review of his medication list, he is on Aricept.   Initial labs showed WBC 10.3, Hgb 10.9, platelets 176, Na+ 137, K+ 3.8 and creatinine 3.03 (2.0 - 2.2 earlier this year). Lactic acid normal at 1.7. Initial and repeat Hs Troponin elevated at 280 and 199. EKG shows NSR, HR 88 with LVH and ST depression along Leads I and AVL.  CXR with no active disease. CT Abdomen showed no acute abnormalities. Was noted to have sigmoid colon diverticulosis without acute abnormalities.  Repeat labs today show that his kidney function is improving with creatinine down to 2.86.  Hemoglobin has further declined to 9.6.  Past Medical History:  Diagnosis Date   Adenocarcinoma of prostate (HCC)    Anxiety    CAD (coronary artery disease)    a. s/p prior stenting of LAD in 1996 and angioplasty alone in 1999 to LAD by review of prior notes. b. 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    Crohn's disease (HCC)    CTS (carpal tunnel syndrome)    Mild   Depression    Diabetes mellitus without complication (HCC)    Type 2   Hyperlipidemia    Hypertension    Kidney stone    Optic neuritis  Past Surgical History:  Procedure Laterality Date   COLONOSCOPY     CORONARY ARTERY BYPASS GRAFT N/A 04/01/2018   Procedure: CORONARY ARTERY BYPASS GRAFTING (CABG) TIMES FOUR: (LIMA to LAD, CRYO VEIN to OM1, SVG to OM2, and SVG to PDA) with PARTIAL EVH/OPEN from RIGHT and LEFT GREATER SAPHENOUS VEIN and LEFT INTERNAL MAMMARY ARTERY;  Surgeon: Kerin Perna, MD;  Location: [facility] OR;  Service: Open Heart Surgery;  Laterality: N/A;   KNEE CARTILAGE SURGERY Right    LEFT HEART CATH AND CORONARY ANGIOGRAPHY N/A 03/27/2018   Procedure: LEFT HEART CATH AND CORONARY ANGIOGRAPHY;  Surgeon: Lennette Bihari, MD;  Location: [facility] INVASIVE CV LAB;  Service: Cardiovascular;  Laterality: N/A;   LOWER  EXTREMITY ANGIOGRAPHY N/A 08/13/2016   Procedure: Lower Extremity Angiography;  Surgeon: Runell Gess, MD;  Location: [facility] INVASIVE CV LAB;  Service: Cardiovascular;  Laterality: N/A;   NASAL SINUS SURGERY     PROSTATE SURGERY     TEE WITHOUT CARDIOVERSION N/A 04/01/2018   Procedure: TRANSESOPHAGEAL ECHOCARDIOGRAM (TEE);  Surgeon: Donata Clay, Theron Arista, MD;  Location: [facility] OR;  Service: Open Heart Surgery;  Laterality: N/A;     Home Medications:  Prior to Admission medications   Medication Sig Start Date End Date Taking? Authorizing Provider  albuterol (VENTOLIN HFA) 108 (90 Base) MCG/ACT inhaler Inhale 2 puffs into the lungs every 4 (four) hours as needed for wheezing or shortness of breath (cough). 01/11/22   Shon Hale, MD  ascorbic acid (VITAMIN C) 500 MG tablet Take 1,000 mg by mouth daily.    [provider]  aspirin EC 81 MG tablet Take 1 tablet (81 mg total) by mouth daily with breakfast. 01/11/22   Shon Hale, MD  Cholecalciferol 25 MCG (1000 UT) CHEW Chew 25 Units by mouth daily.    [provider]  donepezil (ARICEPT) 5 MG tablet Take 1 tablet (5 mg total) by mouth at bedtime. 12/08/21   Tommie Sams, DO  glimepiride (AMARYL) 2 MG tablet Take 1 tablet (2 mg total) by mouth 2 (two) times daily with a meal. 01/11/22 01/11/23  Emokpae, Courage, MD  insulin glargine (LANTUS SOLOSTAR) 100 UNIT/ML Solostar Pen Inject 20 Units into the skin at bedtime. Patient taking differently: Inject 14 Units into the skin at bedtime. 02/26/22   Tommie Sams, DO  Insulin Pen Needle 32G X 4 MM MISC 1 Needle by Does not apply route at bedtime. For insulin administration 01/11/22   Shon Hale, MD  ISOSORBIDE PO Take 5 mg by mouth daily.    [provider]  mesalamine (LIALDA) 1.2 g EC tablet Take 1.2 g by mouth 2 (two) times daily.    [provider]  mirtazapine (REMERON) 7.5 MG tablet Take 1 tablet (7.5 mg total) by mouth at bedtime. 01/11/22   Shon Hale,  MD  omeprazole (PRILOSEC) 20 MG capsule Take 1 capsule (20 mg total) by mouth daily. 01/11/22   Shon Hale, MD  propranolol (INDERAL) 40 MG tablet Take 1 tablet (40 mg total) by mouth 2 (two) times daily. 01/11/22   Shon Hale, MD  risperiDONE (RISPERDAL) 1 MG tablet Take 0.5 tablets (0.5 mg total) by mouth 2 (two) times daily. 09/27/22   Tommie Sams, DO  rosuvastatin (CRESTOR) 20 MG tablet Take 1 tablet (20 mg total) by mouth daily. 02/28/23   Tommie Sams, DO  sertraline (ZOLOFT) 100 MG tablet Take 1.5 tablets (150 mg total) by mouth daily. 12/04/22   Tommie Sams,  DO    Inpatient Medications: Scheduled Meds:  aspirin EC  81 mg Oral Daily   Cholecalciferol  25 Units Oral Daily   donepezil  5 mg Oral QHS   enoxaparin (LOVENOX) injection  30 mg Subcutaneous Q24H   mesalamine  1.2 g Oral BID   mirtazapine  7.5 mg Oral QHS   pantoprazole  40 mg Oral Daily   propranolol  40 mg Oral BID   risperiDONE  0.5 mg Oral BID   rosuvastatin  20 mg Oral Daily   sertraline  150 mg Oral Daily   Continuous Infusions:  sodium chloride 100 mL/hr at 03/29/23 0443   PRN Meds: acetaminophen **OR** acetaminophen, albuterol, magnesium hydroxide, ondansetron **OR** ondansetron (ZOFRAN) IV, traZODone  Allergies:    Allergies  Allergen Reactions   Buprenorphine Other (See Comments)   Buprenorphine Hcl Other (See Comments)   Flomax [Tamsulosin Hcl]     UNSPECIFIED REACTION to HIGH DOSE    Morphine And Codeine     UNSPECIFIED REACTION     Social History:   Social History   Socioeconomic History   Marital status: Married    Spouse name: Not on file   Number of children: Not on file   Years of education: 1   Highest education level: Not on file  Occupational History   Occupation: Curator  Tobacco Use   Smoking status: Former    Current packs/day: 0.00    Types: Cigarettes    Quit date: 02/19/2018    Years since quitting: 5.1   Smokeless tobacco: Never  Vaping Use   Vaping  status: Never Used  Substance and Sexual Activity   Alcohol use: No   Drug use: No   Sexual activity: Not on file  Other Topics Concern   Not on file  Social History Narrative   Not on file    Family History:    Family History  Problem Relation Age of Onset   Hypertension Mother    Colon cancer Mother    Heart attack Father    Coronary artery disease Father    Hypertension Sister    Crohn's disease Brother      ROS:  Please see the history of present illness. All other ROS reviewed and negative.     Physical Exam/Data:   Vitals:   03/29/23 0115 03/29/23 0125 03/29/23 0143 03/29/23 0603  BP:  (!) 143/83 115/76 (!) 142/59  Pulse: 61 67 70 66  Resp: 19 20 16 16   Temp:   97.7 F (36.5 C) 98.1 F (36.7 C)  TempSrc:   Oral Oral  SpO2: 96% 98% 97% 100%  Weight:   76.3 kg   Height:   5\' 11"  (1.803 m)     Intake/Output Summary (Last 24 hours) at 03/29/2023 0944 Last data filed at 03/29/2023 8119 Gross per 24 hour  Intake 1722.44 ml  Output 400 ml  Net 1322.44 ml      03/29/2023    1:43 AM 03/28/2023    2:53 PM 01/21/2023    2:35 PM  Last 3 Weights  Weight (lbs) 168 lb 3.4 oz 170 lb 172 lb  Weight (kg) 76.3 kg 77.111 kg 78.019 kg     Body mass index is 23.46 kg/m.  General:  Well nourished, well developed male appearing in no acute distress HEENT: normal Neck: no JVD Vascular: No carotid bruits; Distal pulses 2+ bilaterally Cardiac:  normal S1, S2; RRR; no murmur  Lungs:  clear to auscultation bilaterally, no wheezing, rhonchi  or rales  Abd: soft, nontender, no hepatomegaly  Ext: no pitting edema Musculoskeletal:  No deformities, BUE and BLE strength normal and equal Skin: warm and dry  Neuro:  CNs 2-12 intact, no focal abnormalities noted Psych:  Normal affect   EKG:  The EKG was personally reviewed and demonstrates:  NSR, HR 88 with LVH and ST depression along Leads I and AVL.  Telemetry:  Telemetry was personally reviewed and demonstrates: NSR, HR in  60's to 70's.   Relevant CV Studies:  NST: 11/2019 Nuclear stress EF: 55%. The left ventricular ejection fraction is normal (55-65%). Blood pressure demonstrated a normal response to exercise. There was no ST segment deviation noted during stress. The study is normal. This is a low risk study.   Normal resting and stress perfusion. No ischemia or infarction EF 55%  Echocardiogram: 12/2021 IMPRESSIONS    1. Left ventricular ejection fraction, by estimation, is 60 to 65%. The  left ventricle has normal function. The left ventricle has no regional  wall motion abnormalities. There is mild left ventricular hypertrophy.  Left ventricular diastolic parameters  are consistent with Grade I diastolic dysfunction (impaired relaxation).   2. Right ventricular systolic function is normal. The right ventricular  size is mildly enlarged.   3. The mitral valve is normal in structure. No evidence of mitral valve  regurgitation. No evidence of mitral stenosis.   4. The aortic valve is calcified. There is moderate calcification of the  aortic valve. There is mild thickening of the aortic valve. Aortic valve  regurgitation is mild. Aortic valve sclerosis/calcification is present,  without any evidence of aortic  stenosis. Aortic valve mean gradient measures 5.0 mmHg. Aortic valve Vmax  measures 1.47 m/s.   5. The inferior vena cava is normal in size with greater than 50%  respiratory variability, suggesting right atrial pressure of 3 mmHg.   Laboratory Data:  High Sensitivity Troponin:   Recent Labs  Lab 03/28/23 1920 03/28/23 2104  TROPONINIHS 280* 199*     Chemistry Recent Labs  Lab 03/28/23 1534 03/29/23 0410  NA 137 138  K 3.8 4.1  CL 109 111  CO2 16* 18*  GLUCOSE 111* 59*  BUN 43* 44*  CREATININE 3.03* 2.86*  CALCIUM 9.2 8.5*  GFRNONAA 20* 22*  ANIONGAP 12 9    Recent Labs  Lab 03/28/23 1534  PROT 7.3  ALBUMIN 3.5  AST 19  ALT 22  ALKPHOS 96  BILITOT 0.4    Hematology Recent Labs  Lab 03/28/23 1534 03/29/23 0410  WBC 10.3 9.4  RBC 3.54* 2.94*  HGB 10.9* 9.6*  HCT 35.1* 29.2*  MCV 99.2 99.3  MCH 30.8 32.7  MCHC 31.1 32.9  RDW 13.4 13.4  PLT 176 150    Radiology/Studies:  CT ABDOMEN PELVIS WO CONTRAST  Result Date: 03/28/2023 CLINICAL DATA:  Abdominal pain.  Nausea. EXAM: CT ABDOMEN AND PELVIS WITHOUT CONTRAST TECHNIQUE: Multidetector CT imaging of the abdomen and pelvis was performed following the standard protocol without IV contrast. RADIATION DOSE REDUCTION: This exam was performed according to the departmental dose-optimization program which includes automated exposure control, adjustment of the mA and/or kV according to patient size and/or use of iterative reconstruction technique. COMPARISON:  MRI abdomen 06/08/2022. FINDINGS: Lower chest: No acute abnormality. Hepatobiliary: No focal liver abnormality is seen. No gallstones, gallbladder wall thickening, or biliary dilatation. Pancreas: Unremarkable. No pancreatic ductal dilatation or surrounding inflammatory changes. Spleen: Normal in size without focal abnormality. Adrenals/Urinary Tract: There is mild left  renal atrophy and scarring. There are prominent vascular calcifications in both kidneys. No definitive renal calculi seen. There is a rounded area in the inferior pole the right kidney measuring 14 mm with peripheral calcification which appears unchanged in size from prior MRI. There is no hydronephrosis. The adrenal glands and bladder are within normal limits. Stomach/Bowel: Stomach is within normal limits. Appendix is not seen. No evidence of bowel wall thickening, distention, or inflammatory changes. There is sigmoid colon diverticulosis. Vascular/Lymphatic: Aortic atherosclerosis. No enlarged abdominal or pelvic lymph nodes. Reproductive: Prostate radiotherapy seeds are present Other: There are small fat containing inguinal hernias. There is no ascites. Musculoskeletal: Degenerative  changes affect the spine. Sternotomy wires are present. IMPRESSION: 1. No acute localizing process in the abdomen or pelvis. 2. Stable right renal lesion with peripheral calcification. 3. Sigmoid colon diverticulosis. 4. Aortic atherosclerosis. Aortic Atherosclerosis (ICD10-I70.0). Electronically Signed   By: Darliss Cheney M.D.   On: 03/28/2023 22:57   DG Chest Port 1 View  Result Date: 03/28/2023 CLINICAL DATA:  Cough and weakness EXAM: PORTABLE CHEST 1 VIEW COMPARISON:  01/09/2022 FINDINGS: Generous left ventricular contour. Prior CABG. There is no edema, consolidation, effusion, or pneumothorax. IMPRESSION: No evidence of active disease. Electronically Signed   By: Tiburcio Pea M.D.   On: 03/28/2023 20:17     Assessment and Plan:   1. Elevated Troponin Values - Presented for evaluation of worsening weakness, nausea and vomiting for the past few weeks. Reports having some abdominal discomfort in the setting of vomiting but no recent exertional chest pain or symptoms resembling his prior angina. - Hs troponin values were mildly elevated at 280 and 199 on admission with EKG showing ST depression along leads I and aVL. I did order an echocardiogram this morning and this has been performed with official results pending. If this is reassuring, would not anticipate further ischemic evaluation at this time as this is likely due to demand ischemia in the setting of his AKI and anemia. Would have a high threshold for cardiac catheterization given his high risk of contrast-induced nephropathy in the setting of CKD. Continue ASA and statin therapy.   2.  CAD - He is s/p CABG in 03/2018 with LIMA-LAD, Cryo vein - OM1, SVG-OM2 and SVG-PDA.  Enzymes have been mildly elevated as discussed above with echocardiogram results pending. - Continue ASA 81 mg daily, Propranolol 40 mg twice daily and Crestor 20 mg daily.  3. HTN - BP is at 142/59 on most recent check. He is due to receive his morning medications,  including Propranolol 40 mg twice daily. Continue to follow with this.   4. Acute on Chronic Stage 3 CKD - Baseline creatinine 2.0 - 2.2. Elevated at 3.03 on admission and improving to 2.86 today. He remains on IV fluids. Further management per the admitting team.  5. Nausea/Vomiting - He reports having episodes of nausea and vomiting for the past several weeks and is unaware of any specific food triggers as he has been trying to consume a bland diet and reports this occurs even with wheat toast. - CT Abdomen on admission showed diverticulosis but no acute abnormalities. Further workup per the admitting team.   6. Anemia - Hemoglobin at 10.9 on admission and down to 9.6 today. Likely due to receiving IV fluids and in the setting of CKD. Further workup per the admitting team.  For questions or updates, please contact [facility] Please consult [email or web address] for contact info under    Signed, Lennart Pall  Iran Ouch, PA-C  03/29/2023 9:44 AM   Attending note:  Patient seen and examined.  I reviewed his records and discussed the case with Ms. Patrick Jupiter, I agree with her above findings.  Cardiology consulted due to finding of mildly abnormal high-sensitivity troponin I levels obtained in the setting of weakness with recurring nausea and emesis but no chest pain.  States that he has not felt well for the last few weeks, presents with evidence of volume contraction and acute kidney injury as well.  He does have known multivessel CAD status post CABG in 2019 as described above.  Has been on medical therapy at baseline without obvious increasing angina.  On examination this morning he appears comfortable, states that he feels weak, cannot ambulate well due to this.  He is afebrile, heart rate in the 60s in sinus rhythm by telemetry, systolic running 295A to 160s.  Lungs are clear with decreased breath sounds.  Cardiac exam with RRR and no gallop or rub.  No peripheral edema.  Pertinent lab  work includes potassium 4.1, creatinine down to 2.86 from 3.03, high-sensitivity troponin I levels of 280 and 199, hemoglobin 9.6.  ECG shows sinus rhythm with prolonged PR interval and LVH with repolarization changes.  CT abdomen and pelvis did not reveal any acute process.  He does have aortic atherosclerosis.  Chest x-ray shows no acute process.  I reviewed his echocardiogram done today which shows normal LVEF at 55 to 60%, moderate diastolic dysfunction, no major valvular abnormalities.  Mild elevation in high-sensitivity troponin I most consistent with demand ischemia, not ACS.  He has had no recent chest pain and his ECG shows no acute ST segment changes.  Would recommend continuing medical therapy from the perspective of heart disease including aspirin, Inderal, and Crestor.  No indication for further ischemic testing at this time.  Jonelle Sidle, M.D., F.A.C.C.

## 2023-03-29 NOTE — Assessment & Plan Note (Signed)
-   This is likely demand ischemia/troponin leak. - We will follow serial troponins.  It has been trending down. - Cardiology consult will be obtained. - Dr. Diona Browner can be notified in AM.

## 2023-03-29 NOTE — Progress Notes (Signed)
*  PRELIMINARY RESULTS* Echocardiogram 2D Echocardiogram has been performed.  Stacey Drain 03/29/2023, 9:34 AM

## 2023-03-29 NOTE — Patient Outreach (Signed)
  Care Coordination   Follow Up Visit Note   03/29/2023 Name: GRAYLEN POLLETT MRN: [id number] DOB: 11-Feb-1946  Chrissie Noa Chronister is a 77 y.o. year old male who sees Tommie Sams, DO for primary care. I spoke with  [name] by phone today.  What matters to the patients health and wellness today?  Sick since his cardiac bypass 7 yrs ago, Shortness of breath with moving and abdominal symptoms  Wife at home not driving yet after her hip injury  She reports the patient is stubborn, sits in his recliner most of day,  Not able to eat a sandwich without his stomach hurts Wife brought him a metal detector encourage him to go outside - no success  Diabetes- His wife reports his cbg values have various a lot at  home from the 50's to the 200's  Confirms "working with" his pcp plus the [employer] administration ([place])   Patient has a nephrologist, Wolfgang Phoenix, missed last visit- last seen in April 2024 Patient has been referred to cardiology & gastroenterology   Goals Addressed   None     SDOH assessments and interventions completed:  No{THN Tip this will not be part of the note when signed-REQUIRED REPORT FIELD DO NOT DELETE (Optional):27901}     Care Coordination Interventions:  Yes, provided {THN Tip this will not be part of the note when signed-REQUIRED REPORT FIELD DO NOT DELETE (Optional):27901}  Follow up plan: Follow up call scheduled for 04/12/23    Encounter Outcome:  Patient Visit Completed {THN Tip this will not be part of the note when signed-REQUIRED REPORT FIELD DO NOT DELETE (Optional):27901}  Ahlijah Raia L. Noelle Penner, RN, BSN, [facility]  VBCI Care Management Coordinator  [phone]  Fax: [phone]

## 2023-03-29 NOTE — Patient Instructions (Signed)
Visit Information  Thank you for taking time to visit with me today. Please don't hesitate to contact me if I can be of assistance to you.   Following are the goals we discussed today:   Goals Addressed   None     Our next appointment is by telephone on 04/12/23 at 1045  Please call the care guide team at [phone] if you need to cancel or reschedule your appointment.   If you are experiencing a Mental Health or Behavioral Health Crisis or need someone to talk to, please call the Suicide and Crisis Lifeline: 988 call the [place] National Suicide Prevention Lifeline: (701)055-8985 or TTY: 434-506-2746 TTY 7160536615) to talk to a trained counselor call 1-800-273-TALK (toll free, 24 hour hotline) call the [facility]: [phone] call 911    No computer access, no preference for copy of AVS   The patient has been provided with contact information for the care management team and has been advised to call with any health related questions or concerns.   Sahian Kerney L. Noelle Penner, RN, BSN, [facility]  VBCI Care Management Coordinator  [phone]  Fax: [phone]

## 2023-03-29 NOTE — Plan of Care (Signed)
  Problem: Education: Goal: Knowledge of General Education information will improve Description: Including pain rating scale, medication(s)/side effects and non-pharmacologic comfort measures Outcome: Progressing   Problem: Clinical Measurements: Goal: Ability to maintain clinical measurements within normal limits will improve Outcome: Progressing   Problem: Activity: Goal: Risk for activity intolerance will decrease Outcome: Progressing   Problem: Nutrition: Goal: Adequate nutrition will be maintained Outcome: Progressing   Problem: Coping: Goal: Level of anxiety will decrease Outcome: Progressing   Problem: Pain Management: Goal: General experience of comfort will improve Outcome: Progressing   Problem: Safety: Goal: Ability to remain free from injury will improve Outcome: Progressing   Problem: Skin Integrity: Goal: Risk for impaired skin integrity will decrease Outcome: Progressing

## 2023-03-29 NOTE — Assessment & Plan Note (Addendum)
-   We will continue Zoloft, trazodone, Remeron and Risperdal.

## 2023-03-29 NOTE — Progress Notes (Signed)
   03/29/23 1337  TOC Brief Assessment  Insurance and Status Reviewed  Patient has primary care physician Yes  Home environment has been reviewed from home  Prior level of function: independent  Prior/Current Home Services No current home services  Social Determinants of Health Reivew SDOH reviewed no interventions necessary  Readmission risk has been reviewed Yes  Transition of care needs no transition of care needs at this time    Pt admitted from home. [facility] notification of [facility] completed. Reference number is [id number].  Transition of Care Department [facility]) has reviewed patient and no TOC needs have been identified at this time. We will continue to monitor patient advancement through interdisciplinary progression rounds. If new patient transition needs arise, please place a TOC consult.

## 2023-03-29 NOTE — Hospital Course (Addendum)
[name] is a 77 y.o. Caucasian male with medical history significant for coronary artery disease status post PCI and stents, Crohn's disease, anxiety, depression, type 2 diabetes mellitus, hypertension and dyslipidemia, who presented to the emergency room with acute onset of recurrent nausea and vomiting for the last 1 to 2 weeks.  He admitted to occasional loose stools.  No abdominal pain or melena or bright red bleeding per rectum.  No fever or chills.  He has been having mild cough without wheezing or dyspnea.  He has been feeling generally weak and dizzy.  His blood glucose has been dropping to the 30s.  He has been showing uncaring the [place] has not been having much appetite lately.  He continued to take his diabetic medications.  No dysuria, oliguria or hematuria or flank pain.    ED Course: BP was 198/81, RR of 23 with otherwise normal vital signs.  Blood gas revealed pH of 7.24 and HCO3 18.4 and CMP revealed a BUN of 43 with a creatinine of 3.03 compared to 42/2.04 on 01/21/2023.  High sensitive troponin I was 280 and later 199.  CBC showed anemia with hemoglobin 10.9 and 35.1 down from 12.3 and 38.2.  Lactic acid was 1.7.  UA was unremarkable. EKG as reviewed by me :  EKG showed normal sinus rhythm with a rate of 86 with first-degree AV block and LVH with repolarization abnormality and T wave inversion laterally. Imaging: Portable chest x-ray showed no acute cardiopulmonary disease.   The patient was given 4 mg of IV Zofran and 20 mg of IV Pepcid as well as 1 L bolus of IV normal saline.  The case was discussed with cardiology at [facility] and the patient was thought to have troponin leak and that this is not likely cardiac given lack of chest pain.    Assessment & Plan:   Principal Problem:   Acute kidney injury superimposed on chronic kidney disease (HCC) Active Problems:   Uncontrolled type 2 diabetes mellitus with hypoglycemia, with long-term current use of insulin (HCC)   Essential  hypertension   Elevated troponin   Dyslipidemia   Depression   GERD without esophagitis   Assessment and Plan:  * Acute kidney injury superimposed on chronic kidney disease stage IIIA  -Exacerbated by dehydration, volume depletion, due to nausea and vomiting  - We will continue hydration with IV normal saline. - Will follow BMP. - We will avoid nephrotoxins.   Uncontrolled type 2 diabetes mellitus with hypoglycemia, with long-term current use of insulin (HCC) - Will check CBG q. ACHS, SSI coverage NovoLog - Will hold off basal coverage and glipizide given hypoglycemia. - Will also hold off metformin for now given his AKI.   Elevated troponin - Denies any chest pain,  likely demand ischemia/troponin leak. - Troponin 280>> 199 -trending down - Cardiology consulted, following - Dr. Diona Browner  -2D echocardiogram:  EJF 55-60%, grade II diastolic dysfunction    Essential hypertension - Continue amlodipine.   Dyslipidemia - We will continue statin    GERD without esophagitis - Continue PPI therapy.   Depression - We will continue Zoloft, trazodone, Remeron and Risperdal.

## 2023-03-29 NOTE — Assessment & Plan Note (Signed)
Will continue amlodipine.

## 2023-03-30 DIAGNOSIS — N179 Acute kidney failure, unspecified: Secondary | ICD-10-CM | POA: Diagnosis not present

## 2023-03-30 DIAGNOSIS — N189 Chronic kidney disease, unspecified: Secondary | ICD-10-CM | POA: Diagnosis not present

## 2023-03-30 LAB — BASIC METABOLIC PANEL
Anion gap: 8 (ref 5–15)
BUN: 36 mg/dL — ABNORMAL HIGH (ref 8–23)
CO2: 17 mmol/L — ABNORMAL LOW (ref 22–32)
Calcium: 8.6 mg/dL — ABNORMAL LOW (ref 8.9–10.3)
Chloride: 115 mmol/L — ABNORMAL HIGH (ref 98–111)
Creatinine, Ser: 2.71 mg/dL — ABNORMAL HIGH (ref 0.61–1.24)
GFR, Estimated: 23 mL/min — ABNORMAL LOW (ref >60)
Glucose, Bld: 131 mg/dL — ABNORMAL HIGH (ref 70–99)
Potassium: 4.6 mmol/L (ref 3.5–5.1)
Sodium: 140 mmol/L (ref 135–145)

## 2023-03-30 LAB — GLUCOSE, CAPILLARY
Glucose-Capillary: 118 mg/dL — ABNORMAL HIGH (ref 70–99)
Glucose-Capillary: 147 mg/dL — ABNORMAL HIGH (ref 70–99)

## 2023-03-30 MED ORDER — LOSARTAN POTASSIUM 25 MG PO TABS: 25.0000 mg | ORAL_TABLET | Freq: Every day | ORAL | 11 refills | Status: DC

## 2023-03-30 NOTE — Evaluation (Signed)
Physical Therapy Evaluation Patient Details Name: [name] MRN: [id number] DOB: 06/02/1945 Today's Date: 03/30/2023  History of Present Illness  [name] is a 77 y.o. Caucasian male with medical history significant for coronary artery disease status post PCI and stents, Crohn's disease, anxiety, depression, type 2 diabetes mellitus, hypertension and dyslipidemia, who presented to the emergency room with acute onset of recurrent nausea and vomiting for the last 1 to 2 weeks.  He admitted to occasional loose stools.  No abdominal pain or melena or bright red bleeding per rectum.  No fever or chills.  He has been having mild cough without wheezing or dyspnea.  He has been feeling generally weak and dizzy.  His blood glucose has been dropping to the 30s.  He has been showing uncaring the [place] has not been having much appetite lately.  He continued to take his diabetic medications.  No dysuria, oliguria or hematuria or flank pain.   Clinical Impression  Patient functioning near baseline for functional mobility and gait demonstrating good return for sitting up at bedside, slightly unsteady on feet with occasional scissoring of right leg, required use of RW for safety with good return for use demonstrated without loss of balance.  Patient tolerated sitting up in chair after therapy - nurse notified. PLAN:  Patient to be discharged home today and discharged from acute physical therapy to care of nursing for ambulation as tolerated for length of stay with recommendations stated below           If plan is discharge home, recommend the following: Help with stairs or ramp for entrance;Assistance with cooking/housework;A little help with walking and/or transfers;A little help with bathing/dressing/bathroom   Can travel by private vehicle        Equipment Recommendations None recommended by PT  Recommendations for Other Services       Functional Status Assessment Patient has had  a recent decline in their functional status and demonstrates the ability to make significant improvements in function in a reasonable and predictable amount of time.     Precautions / Restrictions Precautions Precautions: Fall Restrictions Weight Bearing Restrictions: No      Mobility  Bed Mobility Overal bed mobility: Modified Independent                  Transfers Overall transfer level: Needs assistance Equipment used: None, Rolling walker (2 wheels) Transfers: Sit to/from Stand, Bed to chair/wheelchair/BSC Sit to Stand: Supervision   Step pivot transfers: Supervision       General transfer comment: slightly unsteady movement with good return for transferring to chair without AD and using RW    Ambulation/Gait Ambulation/Gait assistance: Supervision, Contact guard assist Gait Distance (Feet): 65 Feet Assistive device: None, Rolling walker (2 wheels) Gait Pattern/deviations: Decreased step length - right, Decreased step length - left, Decreased stride length, Scissoring Gait velocity: decreased     General Gait Details: slightly unsteady movement with occasional scissoring of RLE when taking steps without AD, safer using RW with good return for ambulating in room/hallway without loss of balance  Stairs            Wheelchair Mobility     Tilt Bed    Modified Rankin (Stroke Patients Only)       Balance Overall balance assessment: Needs assistance Sitting-balance support: Feet supported, No upper extremity supported Sitting balance-Leahy Scale: Good Sitting balance - Comments: seated at EOB   Standing balance support: During functional activity, No upper extremity supported Standing  balance-Leahy Scale: Fair Standing balance comment: fair/good using RW                             Pertinent Vitals/Pain Pain Assessment Pain Assessment: No/denies pain    Home Living Family/patient expects to be discharged to:: Private  residence Living Arrangements: Spouse/significant other Available Help at Discharge: Family;Available 24 hours/day Type of Home: House Home Access: Stairs to enter Entrance Stairs-Rails: Right;Left;Can reach both Entrance Stairs-Number of Steps: 3   Home Layout: One level Home Equipment: Cane - single Librarian, academic (2 wheels)      Prior Function Prior Level of Function : Needs assist       Physical Assist : Mobility (physical);ADLs (physical) Mobility (physical): Bed mobility;Transfers;Gait;Stairs   Mobility Comments: household ambulator using RW PRN ADLs Comments: Assisted by family     Extremity/Trunk Assessment   Upper Extremity Assessment Upper Extremity Assessment: Overall WFL for tasks assessed    Lower Extremity Assessment Lower Extremity Assessment: Generalized weakness    Cervical / Trunk Assessment Cervical / Trunk Assessment: Normal  Communication   Communication Communication: No apparent difficulties  Cognition Arousal: Alert Behavior During Therapy: WFL for tasks assessed/performed Overall Cognitive Status: Within Functional Limits for tasks assessed                                          General Comments      Exercises     Assessment/Plan    PT Assessment All further PT needs can be met in the next venue of care  PT Problem List Decreased strength;Decreased balance;Decreased activity tolerance;Decreased mobility       PT Treatment Interventions      PT Goals (Current goals can be found in the Care Plan section)  Acute Rehab PT Goals Patient Stated Goal: return home with family to assist PT Goal Formulation: With patient Time For Goal Achievement: 03/30/23 Potential to Achieve Goals: Good    Frequency       Co-evaluation               AM-PAC PT "6 Clicks" Mobility  Outcome Measure Help needed turning from your back to your side while in a flat bed without using bedrails?: None Help needed moving  from lying on your back to sitting on the side of a flat bed without using bedrails?: None Help needed moving to and from a bed to a chair (including a wheelchair)?: None Help needed standing up from a chair using your arms (e.g., wheelchair or bedside chair)?: None Help needed to walk in hospital room?: A Little Help needed climbing 3-5 steps with a railing? : A Little 6 Click Score: 22    End of Session   Activity Tolerance: Patient tolerated treatment well;Patient limited by fatigue Patient left: in chair;with call bell/phone within reach Nurse Communication: Mobility status PT Visit Diagnosis: Unsteadiness on feet (R26.81);Other abnormalities of gait and mobility (R26.89);Muscle weakness (generalized) (M62.81)    Time: 1137-1207 PT Time Calculation (min) (ACUTE ONLY): 30 min   Charges:   PT Evaluation $PT Eval Moderate Complexity: 1 Mod PT Treatments $Therapeutic Activity: 23-37 mins PT General Charges $$ ACUTE PT VISIT: 1 Visit         1:25 PM, 03/30/23 Ocie Bob, MPT Physical Therapist with [facility] [phone] office [phone] mobile phone

## 2023-03-30 NOTE — Discharge Summary (Signed)
Physician Discharge Summary   Patient: [name] MRN: [id number] DOB: 12/11/1945  Admit date:     03/28/2023  Discharge date: 03/30/23  Discharge Physician: [name]   PCP: Paul Sams, DO   Recommendations at discharge:   Follow-up with PCP in 1-2 weeks Continue adjustment of current medication, dementia medication  Follow-up with nephrologist as an outpatient 4-6 weeks Patient is to avoid nephrotoxins, increase oral hydration  Discharge Diagnoses: Principal Problem:   Acute kidney injury superimposed on chronic kidney disease (HCC) Active Problems:   Uncontrolled type 2 diabetes mellitus with hypoglycemia, with long-term current use of insulin (HCC)   Essential hypertension   Elevated troponin   Dyslipidemia   Depression   GERD without esophagitis   AKI (acute kidney injury) (HCC)  Resolved Problems:   * No resolved hospital problems. [facility] Course: [name] is a 77 y.o. Caucasian male with medical history significant for coronary artery disease status post PCI and stents, Crohn's disease, anxiety, depression, type 2 diabetes mellitus, hypertension and dyslipidemia, who presented to the emergency room with acute onset of recurrent nausea and vomiting for the last 1 to 2 weeks.  He admitted to occasional loose stools.  No abdominal pain or melena or bright red bleeding per rectum.  No fever or chills.  He has been having mild cough without wheezing or dyspnea.  He has been feeling generally weak and dizzy.  His blood glucose has been dropping to the 30s.  He has been showing uncaring the [name] has not been having much appetite lately.  He continued to take his diabetic medications.  No dysuria, oliguria or hematuria or flank pain.    ED Course: BP was 198/81, RR of 23 with otherwise normal vital signs.  Blood gas revealed pH of 7.24 and HCO3 18.4 and CMP revealed a BUN of 43 with a creatinine of 3.03 compared to 42/2.04 on 01/21/2023.  High  sensitive troponin I was 280 and later 199.  CBC showed anemia with hemoglobin 10.9 and 35.1 down from 12.3 and 38.2.  Lactic acid was 1.7.  UA was unremarkable. EKG as reviewed by me :  EKG showed normal sinus rhythm with a rate of 86 with first-degree AV block and LVH with repolarization abnormality and T wave inversion laterally. Imaging: Portable chest x-ray showed no acute cardiopulmonary disease.   The patient was given 4 mg of IV Zofran and 20 mg of IV Pepcid as well as 1 L bolus of IV normal saline.  The case was discussed with cardiology at [facility] and the patient was thought to have troponin leak and that this is not likely cardiac given lack of chest pain.  * Acute kidney injury superimposed on chronic kidney disease stage IIIA  -Exacerbated by dehydration, volume depletion, due to nausea and vomiting  - We will continue hydration with IV normal saline. Lab Results  Component Value Date   CREATININE 2.71 (H) 03/30/2023   CREATININE 2.86 (H) 03/29/2023   CREATININE 3.03 (H) 03/28/2023    - We will avoid nephrotoxins.   Uncontrolled type 2 diabetes mellitus with hypoglycemia, with long-term current use of insulin (HCC) - Resume home regimen - On Glipizide and Lantus  Elevated troponin - Denies any chest pain,  likely demand ischemia/troponin leak. - Troponin 280>> 199 -trending down - Cardiology consulted, following - Dr. Diona [name]  -2D echocardiogram:  EJF 55-60%, grade II diastolic dysfunction    Essential hypertension - Continue low-dose losartan, Imdur  Dyslipidemia - We will continue statin    GERD without esophagitis - Continue PPI therapy.   Depression - We will continue Zoloft, trazodone, Remeron and Risperdal.      Procedures performed: Echocardiogram Disposition: Home Diet recommendation:  Discharge Diet Orders (From admission, onward)     Start     Ordered   03/30/23 0000  Diet - low sodium heart healthy        03/30/23 1016           Carb  modified diet DISCHARGE MEDICATION: Allergies as of 03/30/2023       Reactions   Buprenorphine Other (See Comments)   Buprenorphine Hcl Other (See Comments)   Flomax [tamsulosin Hcl]    UNSPECIFIED REACTION to HIGH DOSE   Morphine And Codeine    UNSPECIFIED REACTION         Medication List     STOP taking these medications    ascorbic acid 500 MG tablet Commonly known as: VITAMIN C   ISOSORBIDE PO   simvastatin 80 MG tablet Commonly known as: ZOCOR       TAKE these medications    albuterol 108 (90 Base) MCG/ACT inhaler Commonly known as: VENTOLIN HFA Inhale 2 puffs into the lungs every 4 (four) hours as needed for wheezing or shortness of breath (cough).   aspirin EC 81 MG tablet Take 1 tablet (81 mg total) by mouth daily with breakfast.   Cholecalciferol 25 MCG (1000 UT) Chew Chew 25 Units by mouth daily.   donepezil 5 MG tablet Commonly known as: ARICEPT Take 1 tablet (5 mg total) by mouth at bedtime.   glimepiride 2 MG tablet Commonly known as: Amaryl Take 1 tablet (2 mg total) by mouth 2 (two) times daily with a meal. What changed:  how much to take when to take this   Insulin Pen Needle 32G X 4 MM Misc 1 Needle by Does not apply route at bedtime. For insulin administration   Lantus SoloStar 100 UNIT/ML Solostar Pen Generic drug: insulin glargine Inject 20 Units into the skin at bedtime. What changed: how much to take   losartan 25 MG tablet Commonly known as: Cozaar Take 1 tablet (25 mg total) by mouth daily. What changed:  medication strength how much to take   mesalamine 1.2 g EC tablet Commonly known as: LIALDA Take 1.2 g by mouth 2 (two) times daily.   mirtazapine 7.5 MG tablet Commonly known as: REMERON Take 1 tablet (7.5 mg total) by mouth at bedtime.   omeprazole 20 MG capsule Commonly known as: PRILOSEC Take 1 capsule (20 mg total) by mouth daily.   propranolol 40 MG tablet Commonly known as: INDERAL Take 1 tablet (40 mg  total) by mouth 2 (two) times daily.   risperiDONE 1 MG tablet Commonly known as: RISPERDAL Take 0.5 tablets (0.5 mg total) by mouth 2 (two) times daily.   rosuvastatin 20 MG tablet Commonly known as: Crestor Take 1 tablet (20 mg total) by mouth daily.   sertraline 100 MG tablet Commonly known as: Zoloft Take 1.5 tablets (150 mg total) by mouth daily.        Discharge Exam: Filed Weights   03/28/23 1453 03/29/23 0143  Weight: 77.1 kg 76.3 kg        General:  Pleasantly confused, AAO x 2,  cooperative, no distress;   HEENT:  Normocephalic, PERRL, otherwise with in Normal limits   Neuro:  CNII-XII intact. , normal motor and sensation, reflexes intact  Lungs:   Clear to auscultation BL, Respirations unlabored,  No wheezes / crackles  Cardio:    S1/S2, RRR, No murmure, No Rubs or Gallops   Abdomen:  Soft, non-tender, bowel sounds active all four quadrants, no guarding or peritoneal signs.  Muscular  skeletal:  Limited exam -global generalized weaknesses - in bed, able to move all 4 extremities,   2+ pulses,  symmetric, No pitting edema  Skin:  Dry, warm to touch, negative for any Rashes,  Wounds: Please see nursing documentation          Condition at discharge: fair  The results of significant diagnostics from this hospitalization (including imaging, microbiology, ancillary and laboratory) are listed below for reference.   Imaging Studies: ECHOCARDIOGRAM COMPLETE  Result Date: 03/29/2023    ECHOCARDIOGRAM REPORT   Patient Name:   [name] Date of Exam: 03/29/2023 Medical Rec #:  [id number]             Height:       71.0 in Accession #:    [id number]            Weight:       168.2 lb Date of Birth:  December 27, 1945            BSA:          1.959 m Patient Age:    77 years              BP:           142/59 mmHg Patient Gender: M                     HR:           72 bpm. [facility] Procedure: 2D Echo, Cardiac Doppler and Color Doppler  Indications:    Elevated Troponin  History:        Patient has prior history of Echocardiogram examinations, most                 recent 01/10/2022. CAD, Prior CABG; Risk Factors:Hypertension,                 Diabetes and Current Smoker.  Sonographer:    Celesta Gentile RCS Referring Phys: [id number] Ellsworth Lennox IMPRESSIONS  1. Left ventricular ejection fraction, by estimation, is 55 to 60%. The left ventricle has normal function. Left ventricular endocardial border not optimally defined to evaluate regional wall motion. There is mild asymmetric left ventricular hypertrophy  of the basal segment. Left ventricular diastolic parameters are consistent with Grade II diastolic dysfunction (pseudonormalization).  2. Right ventricular systolic function is normal. The right ventricular size is normal. Tricuspid regurgitation signal is inadequate for assessing PA pressure.  3. Left atrial size was mild to moderately dilated.  4. The mitral valve is degenerative. Trivial mitral valve regurgitation.  5. The aortic valve is tricuspid. There is mild calcification of the aortic valve. Aortic valve regurgitation is mild. Aortic valve sclerosis/calcification is present, without any evidence of aortic stenosis. Aortic regurgitation PHT measures 490 msec.  6. The inferior vena cava is normal in size with greater than 50% respiratory variability, suggesting right atrial pressure of 3 mmHg. Comparison(s): Prior images reviewed side by side. LVEF remains normal range at 55-60%. FINDINGS  Left Ventricle: Left ventricular ejection fraction, by estimation, is 55 to 60%. The left ventricle has normal function. Left ventricular endocardial border not optimally defined to evaluate regional wall motion. The left ventricular  internal cavity size was normal in size. There is mild asymmetric left ventricular hypertrophy of the basal segment. Left ventricular diastolic parameters are consistent with Grade II diastolic dysfunction  (pseudonormalization). Right Ventricle: The right ventricular size is normal. No increase in right ventricular wall thickness. Right ventricular systolic function is normal. Tricuspid regurgitation signal is inadequate for assessing PA pressure. Left Atrium: Left atrial size was mild to moderately dilated. Right Atrium: Right atrial size was normal in size. Pericardium: There is no evidence of pericardial effusion. Mitral Valve: The mitral valve is degenerative in appearance. There is mild calcification of the mitral valve leaflet(s). Mild mitral annular calcification. Trivial mitral valve regurgitation. Tricuspid Valve: The tricuspid valve is grossly normal. Tricuspid valve regurgitation is trivial. Aortic Valve: The aortic valve is tricuspid. There is mild calcification of the aortic valve. Aortic valve regurgitation is mild. Aortic regurgitation PHT measures 490 msec. Aortic valve sclerosis/calcification is present, without any evidence of aortic stenosis. Pulmonic Valve: The pulmonic valve was grossly normal. Pulmonic valve regurgitation is trivial. Aorta: The aortic root is normal in size and structure. Venous: The inferior vena cava is normal in size with greater than 50% respiratory variability, suggesting right atrial pressure of 3 mmHg. IAS/Shunts: No atrial level shunt detected by color flow Doppler.  LEFT VENTRICLE PLAX 2D LVIDd:         4.20 cm   Diastology LVIDs:         2.80 cm   LV e' medial:    6.96 cm/s LV PW:         0.90 cm   LV E/e' medial:  18.5 LV IVS:        1.10 cm   LV e' lateral:   7.62 cm/s LVOT diam:     1.90 cm   LV E/e' lateral: 16.9 LV SV:         80 LV SV Index:   41 LVOT Area:     2.84 cm  RIGHT VENTRICLE RV S prime:     10.90 cm/s TAPSE (M-mode): 1.8 cm LEFT ATRIUM             Index        RIGHT ATRIUM           Index LA diam:        3.10 cm 1.58 cm/m   RA Area:     14.40 cm LA Vol (A2C):   94.4 ml 48.19 ml/m  RA Volume:   30.40 ml  15.52 ml/m LA Vol (A4C):   64.9 ml 33.13  ml/m LA Biplane Vol: 79.6 ml 40.63 ml/m  AORTIC VALVE LVOT Vmax:   130.00 cm/s LVOT Vmean:  88.300 cm/s LVOT VTI:    0.282 m AI PHT:      490 msec  AORTA Ao Root diam: 3.10 cm MITRAL VALVE MV Area (PHT): 3.99 cm     SHUNTS MV Decel Time: 190 msec     Systemic VTI:  0.28 m MV E velocity: 129.00 cm/s  Systemic Diam: 1.90 cm MV A velocity: 121.00 cm/s MV E/A ratio:  1.07 Nona Dell MD Electronically signed by Nona Dell MD Signature Date/Time: 03/29/2023/11:02:11 AM    Final    CT ABDOMEN PELVIS WO CONTRAST  Result Date: 03/28/2023 CLINICAL DATA:  Abdominal pain.  Nausea. EXAM: CT ABDOMEN AND PELVIS WITHOUT CONTRAST TECHNIQUE: Multidetector CT imaging of the abdomen and pelvis was performed following the standard protocol without IV contrast. RADIATION DOSE REDUCTION: This exam was performed according to  the departmental dose-optimization program which includes automated exposure control, adjustment of the mA and/or kV according to patient size and/or use of iterative reconstruction technique. COMPARISON:  MRI abdomen 06/08/2022. FINDINGS: Lower chest: No acute abnormality. Hepatobiliary: No focal liver abnormality is seen. No gallstones, gallbladder wall thickening, or biliary dilatation. Pancreas: Unremarkable. No pancreatic ductal dilatation or surrounding inflammatory changes. Spleen: Normal in size without focal abnormality. Adrenals/Urinary Tract: There is mild left renal atrophy and scarring. There are prominent vascular calcifications in both kidneys. No definitive renal calculi seen. There is a rounded area in the inferior pole the right kidney measuring 14 mm with peripheral calcification which appears unchanged in size from prior MRI. There is no hydronephrosis. The adrenal glands and bladder are within normal limits. Stomach/Bowel: Stomach is within normal limits. Appendix is not seen. No evidence of bowel wall thickening, distention, or inflammatory changes. There is sigmoid colon  diverticulosis. Vascular/Lymphatic: Aortic atherosclerosis. No enlarged abdominal or pelvic lymph nodes. Reproductive: Prostate radiotherapy seeds are present Other: There are small fat containing inguinal hernias. There is no ascites. Musculoskeletal: Degenerative changes affect the spine. Sternotomy wires are present. IMPRESSION: 1. No acute localizing process in the abdomen or pelvis. 2. Stable right renal lesion with peripheral calcification. 3. Sigmoid colon diverticulosis. 4. Aortic atherosclerosis. Aortic Atherosclerosis (ICD10-I70.0). Electronically Signed   By: Darliss Cheney M.D.   On: 03/28/2023 22:57   DG Chest Port 1 View  Result Date: 03/28/2023 CLINICAL DATA:  Cough and weakness EXAM: PORTABLE CHEST 1 VIEW COMPARISON:  01/09/2022 FINDINGS: Generous left ventricular contour. Prior CABG. There is no edema, consolidation, effusion, or pneumothorax. IMPRESSION: No evidence of active disease. Electronically Signed   By: Tiburcio Pea M.D.   On: 03/28/2023 20:17    Microbiology: Results for orders placed or performed during the hospital encounter of 01/09/22  MRSA Next Gen by PCR, Nasal     Status: None   Collection Time: 01/10/22 11:26 AM   Specimen: Nasal Mucosa; Nasal Swab  Result Value Ref Range Status   MRSA by PCR Next Gen NOT DETECTED NOT DETECTED Final    Comment: (NOTE) The GeneXpert MRSA Assay (FDA approved for NASAL specimens only), is one component of a comprehensive MRSA colonization surveillance program. It is not intended to diagnose MRSA infection nor to guide or monitor treatment for MRSA infections. Test performance is not FDA approved in patients less than 58 years old. Performed at [facility], [address] [place], [place] [address]     Labs: CBC: Recent Labs  Lab 03/28/23 1534 03/29/23 0410  WBC 10.3 9.4  NEUTROABS 8.4*  --   HGB 10.9* 9.6*  HCT 35.1* 29.2*  MCV 99.2 99.3  PLT 176 150   Basic Metabolic Panel: Recent Labs  Lab 03/28/23 1534  03/29/23 0410 03/30/23 0824  NA 137 138 140  K 3.8 4.1 4.6  CL 109 111 115*  CO2 16* 18* 17*  GLUCOSE 111* 59* 131*  BUN 43* 44* 36*  CREATININE 3.03* 2.86* 2.71*  CALCIUM 9.2 8.5* 8.6*   Liver Function Tests: Recent Labs  Lab 03/28/23 1534  AST 19  ALT 22  ALKPHOS 96  BILITOT 0.4  PROT 7.3  ALBUMIN 3.5   CBG: Recent Labs  Lab 03/28/23 1453 03/29/23 1134 03/29/23 1635 03/29/23 2029 03/30/23 0733  GLUCAP 96 83 126* 86 118*    Discharge time spent: greater than 40 minutes.  Signed: Kendell Bane, MD [facility] 03/30/2023

## 2023-03-30 NOTE — Progress Notes (Signed)
Patient verbalized complaints of increased weakness, shortness of breath, noted patient pale. Lung sounds clear. Vital signs: T-98.6, P-64, R-22, BP-129/71, O2-100% at room air. MD Shahmehdi made aware. New orders placed.

## 2023-03-30 NOTE — Progress Notes (Signed)
Patient discharged home today, transported home by family. Discharge paperwork went over with patient and contacted spouse via phone to go over discharge paperwork, both verbalized understanding. Belongings sent home with patient.

## 2023-03-30 NOTE — TOC Transition Note (Signed)
Transition of Care [facility]) - CM/SW Discharge Note   Patient Details  Name: [name] MRN: [id number] Date of Birth: 1945-05-23  Transition of Care [facility]) CM/SW Contact:  Catalina Gravel, LCSW Phone Number: 03/30/2023, 1:21 PM   Clinical Narrative:    Pt wants HHPT to regain strength.  CSW spoke with spouse and pt both in agreement with referral.  Dr. Gerlene Fee order. CSW emailed referral to [place].  [facility] agreed to accept pt. CSW placed in AVS summary.  No further TOC needs.     Final next level of care: Home w Home Health Services Barriers to Discharge: No Barriers Identified   Patient Goals and CMS Choice CMS Medicare.gov Compare Post Acute Care list provided to:: Patient    Discharge Placement                         Discharge Plan and Services Additional resources added to the After Visit Summary for   In-house Referral: Clinical Social Work   Post Acute Care Choice: Home Health                    HH Arranged: PT [facility] Agency: [facility] Date [facility] Agency Contacted: 03/30/23 Time HH Agency Contacted: 1319 Representative spoke with at [facility] Agency: Denyse Amass  Social Determinants of Health (SDOH) Interventions SDOH Screenings   Food Insecurity: No Food Insecurity (03/29/2023)  Housing: Low Risk  (03/29/2023)  Transportation Needs: No Transportation Needs (03/29/2023)  Recent Concern: Transportation Needs - Unmet Transportation Needs (02/27/2023)  Utilities: Not At Risk (03/29/2023)  Depression (PHQ2-9): High Risk (12/04/2022)  Financial Resource Strain: Low Risk  (01/24/2022)  Social Connections: Socially Isolated (01/26/2022)  Stress: No Stress Concern Present (01/24/2022)  Tobacco Use: Medium Risk (03/28/2023)     Readmission Risk Interventions     No data to display

## 2023-03-30 NOTE — Plan of Care (Signed)

## 2023-04-03 ENCOUNTER — Telehealth: Payer: Self-pay

## 2023-04-03 DIAGNOSIS — E1122 Type 2 diabetes mellitus with diabetic chronic kidney disease: Secondary | ICD-10-CM | POA: Diagnosis not present

## 2023-04-03 DIAGNOSIS — N179 Acute kidney failure, unspecified: Secondary | ICD-10-CM | POA: Diagnosis not present

## 2023-04-03 DIAGNOSIS — E11649 Type 2 diabetes mellitus with hypoglycemia without coma: Secondary | ICD-10-CM | POA: Diagnosis not present

## 2023-04-03 DIAGNOSIS — I700 Atherosclerosis of aorta: Secondary | ICD-10-CM | POA: Diagnosis not present

## 2023-04-03 DIAGNOSIS — I2510 Atherosclerotic heart disease of native coronary artery without angina pectoris: Secondary | ICD-10-CM | POA: Diagnosis not present

## 2023-04-03 DIAGNOSIS — K520 Gastroenteritis and colitis due to radiation: Secondary | ICD-10-CM | POA: Diagnosis not present

## 2023-04-03 DIAGNOSIS — C61 Malignant neoplasm of prostate: Secondary | ICD-10-CM | POA: Diagnosis not present

## 2023-04-03 DIAGNOSIS — I129 Hypertensive chronic kidney disease with stage 1 through stage 4 chronic kidney disease, or unspecified chronic kidney disease: Secondary | ICD-10-CM | POA: Diagnosis not present

## 2023-04-03 DIAGNOSIS — N1831 Chronic kidney disease, stage 3a: Secondary | ICD-10-CM | POA: Diagnosis not present

## 2023-04-03 NOTE — Telephone Encounter (Addendum)
Spoke with patient's wife and patient to verify any current concerns. They both state he is doing fine and is not needing anything right now. Will call as needed.   ----- Message from Tommie Sams sent at 04/03/2023  8:16 AM EST ----- Regarding: RE: wife concerns, follow up visits ?referral/FYI Please find out current concerns. ----- Message ----- From: Clinton Gallant, RN Sent: 04/02/2023  11:32 AM EST To: Tommie Sams, DO; [facility] Subject: wife concerns, follow up visits ?referral/FYI  D/c 03/30/23 from [facility] AKI superimposed CKD. D/c report states need follow up with neurology, pcp, ? Nephrology.  Missed his last Nephrology appointment. Wife is recovering from an injury and voices concern with patient daily complaints of being sick related to abdominal pain, uncontrolled DM and overall immobility/noncompliance (r/t ?dementia). Pt reluctant to go to any office without wife. Will speak with Nephrology about possible telephonic follow up. Wife voices interest in having a conversation with pcp about present concerns and future treatment (she is exhausted) Will he be able to get a telephonic follow up if needed with pcp & can pcp speak with wife?

## 2023-04-05 DIAGNOSIS — K520 Gastroenteritis and colitis due to radiation: Secondary | ICD-10-CM | POA: Diagnosis not present

## 2023-04-05 DIAGNOSIS — C61 Malignant neoplasm of prostate: Secondary | ICD-10-CM | POA: Diagnosis not present

## 2023-04-05 DIAGNOSIS — N1831 Chronic kidney disease, stage 3a: Secondary | ICD-10-CM | POA: Diagnosis not present

## 2023-04-05 DIAGNOSIS — I2510 Atherosclerotic heart disease of native coronary artery without angina pectoris: Secondary | ICD-10-CM | POA: Diagnosis not present

## 2023-04-05 DIAGNOSIS — I700 Atherosclerosis of aorta: Secondary | ICD-10-CM | POA: Diagnosis not present

## 2023-04-05 DIAGNOSIS — E11649 Type 2 diabetes mellitus with hypoglycemia without coma: Secondary | ICD-10-CM | POA: Diagnosis not present

## 2023-04-05 DIAGNOSIS — E1122 Type 2 diabetes mellitus with diabetic chronic kidney disease: Secondary | ICD-10-CM | POA: Diagnosis not present

## 2023-04-05 DIAGNOSIS — N179 Acute kidney failure, unspecified: Secondary | ICD-10-CM | POA: Diagnosis not present

## 2023-04-05 DIAGNOSIS — I129 Hypertensive chronic kidney disease with stage 1 through stage 4 chronic kidney disease, or unspecified chronic kidney disease: Secondary | ICD-10-CM | POA: Diagnosis not present

## 2023-04-09 DIAGNOSIS — I700 Atherosclerosis of aorta: Secondary | ICD-10-CM | POA: Diagnosis not present

## 2023-04-09 DIAGNOSIS — E11649 Type 2 diabetes mellitus with hypoglycemia without coma: Secondary | ICD-10-CM | POA: Diagnosis not present

## 2023-04-09 DIAGNOSIS — K520 Gastroenteritis and colitis due to radiation: Secondary | ICD-10-CM | POA: Diagnosis not present

## 2023-04-09 DIAGNOSIS — I129 Hypertensive chronic kidney disease with stage 1 through stage 4 chronic kidney disease, or unspecified chronic kidney disease: Secondary | ICD-10-CM | POA: Diagnosis not present

## 2023-04-09 DIAGNOSIS — E1122 Type 2 diabetes mellitus with diabetic chronic kidney disease: Secondary | ICD-10-CM | POA: Diagnosis not present

## 2023-04-09 DIAGNOSIS — C61 Malignant neoplasm of prostate: Secondary | ICD-10-CM | POA: Diagnosis not present

## 2023-04-09 DIAGNOSIS — N1831 Chronic kidney disease, stage 3a: Secondary | ICD-10-CM | POA: Diagnosis not present

## 2023-04-09 DIAGNOSIS — N179 Acute kidney failure, unspecified: Secondary | ICD-10-CM | POA: Diagnosis not present

## 2023-04-09 DIAGNOSIS — I2510 Atherosclerotic heart disease of native coronary artery without angina pectoris: Secondary | ICD-10-CM | POA: Diagnosis not present

## 2023-04-12 ENCOUNTER — Ambulatory Visit: Payer: Self-pay | Admitting: *Deleted

## 2023-04-12 DIAGNOSIS — N179 Acute kidney failure, unspecified: Secondary | ICD-10-CM | POA: Diagnosis not present

## 2023-04-12 DIAGNOSIS — I700 Atherosclerosis of aorta: Secondary | ICD-10-CM | POA: Diagnosis not present

## 2023-04-12 DIAGNOSIS — K520 Gastroenteritis and colitis due to radiation: Secondary | ICD-10-CM | POA: Diagnosis not present

## 2023-04-12 DIAGNOSIS — E11649 Type 2 diabetes mellitus with hypoglycemia without coma: Secondary | ICD-10-CM | POA: Diagnosis not present

## 2023-04-12 DIAGNOSIS — F4310 Post-traumatic stress disorder, unspecified: Secondary | ICD-10-CM | POA: Insufficient documentation

## 2023-04-12 DIAGNOSIS — F4312 Post-traumatic stress disorder, chronic: Secondary | ICD-10-CM | POA: Insufficient documentation

## 2023-04-12 DIAGNOSIS — E1122 Type 2 diabetes mellitus with diabetic chronic kidney disease: Secondary | ICD-10-CM | POA: Diagnosis not present

## 2023-04-12 DIAGNOSIS — I129 Hypertensive chronic kidney disease with stage 1 through stage 4 chronic kidney disease, or unspecified chronic kidney disease: Secondary | ICD-10-CM | POA: Diagnosis not present

## 2023-04-12 DIAGNOSIS — C61 Malignant neoplasm of prostate: Secondary | ICD-10-CM | POA: Diagnosis not present

## 2023-04-12 DIAGNOSIS — N1831 Chronic kidney disease, stage 3a: Secondary | ICD-10-CM | POA: Diagnosis not present

## 2023-04-12 DIAGNOSIS — I2510 Atherosclerotic heart disease of native coronary artery without angina pectoris: Secondary | ICD-10-CM | POA: Diagnosis not present

## 2023-04-12 NOTE — Patient Outreach (Addendum)
Care Coordination   Follow Up Visit Note   04/12/2023 Name: [name] MRN: [id number] DOB: 1945-08-25  [name] is a 77 y.o. year old male who sees Tommie Sams, DO for primary care. I spoke with  [name] by phone today.  What matters to the patients health and wellness today?  Doing good shortness of breath at intervals still   Patient told wife inhalers not working Question if he is taking inhalers correct   Wife reports the present concern is the patient's anxiety and getting  help with improving sleep  Will called by the [employer] administration ([place]) on 04/22/23 [facility] is also his psychiatrist Mrs Laessig will updated the [facility] p[facility] that the present medications for behavior, sleep and anxiety are not effective  Patient denies  pain per wife  His caregiver , his wife is healing with some pain of her hip  She will postpone patient's nephrology when she is allowed to drive  Continues with abdominal pain  Same insurance in 2025  Voiced understanding of reasons for poor taste, URI, sinusitis, smoking allergies nasal polyps  medicine s/e, aging poor hygiene   Wrote down the number for the [facility]  advocates [phone] Stacie [place] or Diona Browner  Goals Addressed             This Visit's Progress    manage hypertension, diabetes crohn's, Telephone office visits care coordination   On track    Interventions Today    Flowsheet Row Most Recent Value  Chronic Disease   Chronic disease during today's visit Diabetes, Chronic Kidney Disease/End Stage Renal Disease (ESRD), Congestive Heart Failure (CHF), Hypertension (HTN), Other  [Abdominal pain, VA patient advocate to assist with possible change of VA clinic, Confirmed patient working with [facility] PT but not well. Reluctant to complete all exercises.]  General Interventions   General Interventions Discussed/Reviewed General Interventions Reviewed, Doctor Visits, Community Resources   [place] advocated to assist with possiblely transferring patient services closer to their home in  [place], [place] or , [place]  Confirms [facility] PT will be out 11-12 today to see patient]  Doctor Visits Discussed/Reviewed Doctor Visits Reviewed, PCP, Specialist  PCP/Specialist Visits Compliance with follow-up visit  Exercise Interventions   Physical Activity Discussed/Reviewed Physical Activity Reviewed, Home Exercise Program (HEP)  [home health services]  Education Interventions   Education Provided Provided Education  [employer] , reasons for poor taste, URI, sinusitis, smoking allergies nasal polyps medicine s/e, aging poor hygiene ansered questions r/t possible other option for colonoscopyprep as wife had a bypass]  Provided Verbal Education On Nutrition, Blood Sugar Monitoring, Mental Health/Coping with Illness, Medication, Exercise, [employer], [employer], Other  [assessed if insurance changes for 2025 - none]  Mental Health Interventions   Mental Health Discussed/Reviewed Mental Health Reviewed, Coping Strategies, Other, Anxiety  Nutrition Interventions   Nutrition Discussed/Reviewed Nutrition Reviewed, Adding fruits and vegetables, Fluid intake, Increasing proteins, Decreasing sugar intake, Portion sizes  Pharmacy Interventions   Pharmacy Dicussed/Reviewed Pharmacy Topics Reviewed, Affording Medications  Safety Interventions   Safety Discussed/Reviewed Safety Reviewed, Home Safety  Home Safety Assistive Devices              SDOH assessments and interventions completed:  No     Care Coordination Interventions:  Yes, provided   Follow up plan: Follow up call scheduled for 05/24/23 1045    Encounter Outcome:  Patient Visit Completed   Jaquille Kau L. Noelle Penner, RN, BSN, CCM  [facility] Care Management Coordinator  [phone]  Fax: [phone]

## 2023-04-12 NOTE — Patient Instructions (Signed)
Visit Information  Thank you for taking time to visit with me today. Please don't hesitate to contact me if I can be of assistance to you.   Following are the goals we discussed today:   Goals Addressed             This Visit's Progress    manage hypertension, diabetes crohn's, Telephone office visits care coordination   On track    Interventions Today    Flowsheet Row Most Recent Value  Chronic Disease   Chronic disease during today's visit Diabetes, Chronic Kidney Disease/End Stage Renal Disease (ESRD), Congestive Heart Failure (CHF), Hypertension (HTN), Other  [Abdominal pain, VA patient advocate to assist with possible change of VA clinic, Confirmed patient working with [facility] but not well. Reluctant to complete all exercises.]  General Interventions   General Interventions Discussed/Reviewed General Interventions Reviewed, Doctor Visits, Community Resources  [place] advocated to assist with possiblely transferring patient services closer to their home in  [place], [place] or , [place]  Confirms [facility] PT will be out 11-12 today to see patient]  Doctor Visits Discussed/Reviewed Doctor Visits Reviewed, PCP, Specialist  PCP/Specialist Visits Compliance with follow-up visit  Exercise Interventions   Physical Activity Discussed/Reviewed Physical Activity Reviewed, Home Exercise Program (HEP)  [home health services]  Education Interventions   Education Provided Provided Education  [employer] , reasons for poor taste, URI, sinusitis, smoking allergies nasal polyps medicine s/e, aging poor hygiene ansered questions r/t possible other option for colonoscopyprep as wife had a bypass]  Provided Verbal Education On Nutrition, Blood Sugar Monitoring, Mental Health/Coping with Illness, Medication, Exercise, [employer], [employer], Other  [assessed if insurance changes for 2025 - none]  Mental Health Interventions   Mental Health Discussed/Reviewed Mental Health  Reviewed, Coping Strategies, Other, Anxiety  Nutrition Interventions   Nutrition Discussed/Reviewed Nutrition Reviewed, Adding fruits and vegetables, Fluid intake, Increasing proteins, Decreasing sugar intake, Portion sizes  Pharmacy Interventions   Pharmacy Dicussed/Reviewed Pharmacy Topics Reviewed, Affording Medications  Safety Interventions   Safety Discussed/Reviewed Safety Reviewed, Home Safety  Home Safety Assistive Devices              Our next appointment is by telephone on 05/24/23 at 1045  Please call the care guide team at [phone] if you need to cancel or reschedule your appointment.   If you are experiencing a Mental Health or Behavioral Health Crisis or need someone to talk to, please call the Suicide and Crisis Lifeline: 988 call the [place] National Suicide Prevention Lifeline: (660) 056-8693 or TTY: (445)564-1292 TTY (978)116-6085) to talk to a trained counselor call 1-800-273-TALK (toll free, 24 hour hotline) call the [facility]: 402-050-4243 call 911   No computer access, no preference for copy of AVS     The patient has been provided with contact information for the care management team and has been advised to call with any health related questions or concerns.   Kerem Gilmer L. Noelle Penner, RN, BSN, [facility]  VBCI Care Management Coordinator  [phone]  Fax: [phone]

## 2023-04-19 DIAGNOSIS — N1831 Chronic kidney disease, stage 3a: Secondary | ICD-10-CM | POA: Diagnosis not present

## 2023-04-19 DIAGNOSIS — E11649 Type 2 diabetes mellitus with hypoglycemia without coma: Secondary | ICD-10-CM | POA: Diagnosis not present

## 2023-04-19 DIAGNOSIS — E1122 Type 2 diabetes mellitus with diabetic chronic kidney disease: Secondary | ICD-10-CM | POA: Diagnosis not present

## 2023-04-19 DIAGNOSIS — I129 Hypertensive chronic kidney disease with stage 1 through stage 4 chronic kidney disease, or unspecified chronic kidney disease: Secondary | ICD-10-CM | POA: Diagnosis not present

## 2023-04-19 DIAGNOSIS — I2510 Atherosclerotic heart disease of native coronary artery without angina pectoris: Secondary | ICD-10-CM | POA: Diagnosis not present

## 2023-04-19 DIAGNOSIS — N2889 Other specified disorders of kidney and ureter: Secondary | ICD-10-CM

## 2023-04-19 DIAGNOSIS — I700 Atherosclerosis of aorta: Secondary | ICD-10-CM | POA: Diagnosis not present

## 2023-04-19 DIAGNOSIS — K520 Gastroenteritis and colitis due to radiation: Secondary | ICD-10-CM | POA: Diagnosis not present

## 2023-04-19 DIAGNOSIS — E785 Hyperlipidemia, unspecified: Secondary | ICD-10-CM

## 2023-04-19 DIAGNOSIS — I083 Combined rheumatic disorders of mitral, aortic and tricuspid valves: Secondary | ICD-10-CM

## 2023-04-19 DIAGNOSIS — N179 Acute kidney failure, unspecified: Secondary | ICD-10-CM | POA: Diagnosis not present

## 2023-04-19 DIAGNOSIS — C61 Malignant neoplasm of prostate: Secondary | ICD-10-CM | POA: Diagnosis not present

## 2023-05-21 ENCOUNTER — Ambulatory Visit: Payer: Medicare HMO | Admitting: Family Medicine

## 2023-05-23 ENCOUNTER — Ambulatory Visit: Payer: Medicare HMO | Attending: Internal Medicine | Admitting: Internal Medicine

## 2023-05-23 NOTE — Progress Notes (Signed)
Erroneous encounter - please disregard.

## 2023-05-24 ENCOUNTER — Ambulatory Visit: Payer: Self-pay | Admitting: *Deleted

## 2023-05-24 NOTE — Patient Outreach (Signed)
  Care Coordination   05/24/2023 Name: [name] MRN: [id number] DOB: 13-Jan-1946   Care Coordination Outreach Attempts:  An unsuccessful outreach was attempted for an appointment today.  Follow Up Plan:  Additional outreach attempts will be made to offer the patient complex care management information and services.   Encounter Outcome:  No Answer   Care Coordination Interventions:  No, not indicated    Pearce Littlefield L. Noelle Penner, RN, BSN, CCM [place]  [facility], [facility] Health RN Care Manager Direct Dial: [phone]  Fax: [phone] Mailing Address: [address]  [place] [address] Website: [email or web address]

## 2023-05-31 ENCOUNTER — Ambulatory Visit: Payer: Self-pay | Admitting: *Deleted

## 2023-05-31 DIAGNOSIS — F17200 Nicotine dependence, unspecified, uncomplicated: Secondary | ICD-10-CM | POA: Insufficient documentation

## 2023-05-31 DIAGNOSIS — F331 Major depressive disorder, recurrent, moderate: Secondary | ICD-10-CM | POA: Insufficient documentation

## 2023-05-31 DIAGNOSIS — F1310 Sedative, hypnotic or anxiolytic abuse, uncomplicated: Secondary | ICD-10-CM | POA: Insufficient documentation

## 2023-05-31 DIAGNOSIS — Z77098 Contact with and (suspected) exposure to other hazardous, chiefly nonmedicinal, chemicals: Secondary | ICD-10-CM | POA: Insufficient documentation

## 2023-05-31 DIAGNOSIS — Z7739 Contact with and (suspected) exposure to other war theater: Secondary | ICD-10-CM | POA: Insufficient documentation

## 2023-05-31 DIAGNOSIS — G2581 Restless legs syndrome: Secondary | ICD-10-CM | POA: Insufficient documentation

## 2023-05-31 NOTE — Patient Outreach (Signed)
  Care Coordination   05/31/2023 Name: [name] MRN: [id number] DOB: 01-17-1946   Care Coordination Outreach Attempts:  An unsuccessful telephone outreach was attempted today to offer the patient information about available complex care management services.  Follow Up Plan:  Additional outreach attempts will be made to offer the patient complex care management information and services.   Encounter Outcome:  No Answer   Care Coordination Interventions:  No, not indicated     Jasmon Graffam L. Ramonita, RN, BSN, CCM [place]  [facility], [facility] Health RN Care Manager Direct Dial: [phone]  Fax: [phone] Mailing Address: [address]  [place] [address] Website: [email or web address]

## 2023-05-31 NOTE — Patient Instructions (Signed)
 Visit Information  Thank you for taking time to visit with me today. Please don't hesitate to contact me if I can be of assistance to you.   Following are the goals we discussed today:   Goals Addressed             This Visit's Progress    manage hypertension, diabetes crohn's,memory MD office visits - VBCI care coordination   Not on track    Interventions Today    Flowsheet Row Most Recent Value  Chronic Disease   Chronic disease during today's visit Other  [decreassed mobility, changes in memory, poor appetite/stomach pain, not wanting to go to MD visits,]  General Interventions   General Interventions Discussed/Reviewed General Interventions Reviewed, [employer], Doctor Visits, Communication with  Doctor Visits Discussed/Reviewed Doctor Visits Reviewed  maurene confirms [name] does not want to visit any MDs since last year. Voiced interest in home visiting MD when discussed]  PCP/Specialist Visits Contact provider for referral to  [facility] health, discussed patient not wanting to go to MD visits, PCP/Medicare requires a face to face MD visit prior to referring to home health.]  Contacted provider for referral to PCP  [facility] health services Chat with local pcp, left message for [facility] care support team [phone] ext [phone]]  Communication with PCP/Specialists  Exercise Interventions   Exercise Discussed/Reviewed Exercise Reviewed, Physical Activity  [with decreased mobility per wife]  Physical Activity Discussed/Reviewed Physical Activity Reviewed  Education Interventions   Education Provided Provided Education  [facility] health ordering process, medicare face to face , visting home MDs, possible VA services]  Provided Verbal Education On Nutrition, Mental Health/Coping with Illness, Community Resources  Mental Health Interventions   Mental Health Discussed/Reviewed Mental Health Reviewed, Other, Coping Strategies  [patient with increased memory loss per wife]   Nutrition Interventions   Nutrition Discussed/Reviewed Nutrition Reviewed, Fluid intake, Supplemental nutrition, Increasing proteins  Pharmacy Interventions   Pharmacy Dicussed/Reviewed Pharmacy Topics Reviewed, Affording Medications, Medications and their functions  Safety Interventions   Safety Discussed/Reviewed Safety Reviewed, Home Safety  Home Safety Assistive Devices  Advanced Directive Interventions   Advanced Directives Discussed/Reviewed Advanced Directives Discussed  [previously provided forms Pending completion]              Our next appointment is by telephone on 06/03/23 at 2:30 pm  Please call the care guide team at [phone] if you need to cancel or reschedule your appointment.   If you are experiencing a Mental Health or Behavioral Health Crisis or need someone to talk to, please call the Suicide and Crisis Lifeline: 988 call the [place]  National Suicide Prevention Lifeline: 765-258-7118 or TTY: (609)866-8876 TTY 939-878-6320) to talk to a trained counselor call 1-800-273-TALK (toll free, 24 hour hotline) call the [facility]: [phone] call 911   No computer access, no preference for copy of AVS     The patient has been provided with contact information for the care management team and has been advised to call with any health related questions or concerns.   Laikyn Gewirtz L. Ramonita, RN, BSN, CCM [place]  [facility], [facility] Health RN Care Manager Direct Dial: [phone]  Fax: [phone] Mailing Address: [address]  [place] [address] Website: [email or web address]

## 2023-05-31 NOTE — Patient Outreach (Signed)
 Care Coordination   Follow Up Visit Note   05/31/2023 Name: [name] MRN: [id number] DOB: 05-06-1945  [name] is a 78 y.o. year old male who sees [name], Paul G, DO for primary care. I spoke with [name] wife of  [name] by phone today.  What matters to the patients health and wellness today?  Stomach pain decreassed mobility, changes in memory, poor appetite/stomach pain, not wanting to go to MD visits,  Wif [name] agrees to allow RN CM to out reach to pcp for possible home health orders.  PCP will need patient to visit the office for a face to face prior to home health orders. Wife agreed with RN CM to attempt to find a home visiting MD as she feels patient has memory issues as evidence by him forgetting where he is at times, misplaces items, Argues about misplaced items. Wife does receive some assisted with patient from a niece as she continues to recover from a fracture after a fall in 2024. Wife also plans to speak with pcp during an office visit next week   Goals Addressed             This Visit's Progress    manage hypertension, diabetes crohn's,memory MD office visits - VBCI care coordination   Not on track    Interventions Today    Flowsheet Row Most Recent Value  Chronic Disease   Chronic disease during today's visit Other  [decreassed mobility, changes in memory, poor appetite/stomach pain, not wanting to go to MD visits,]  General Interventions   General Interventions Discussed/Reviewed General Interventions Reviewed, [employer], Doctor Visits, Communication with  Doctor Visits Discussed/Reviewed Doctor Visits Reviewed  [name] confirms [name] does not want to visit any MDs since last year. Voiced interest in home visiting MD when discussed]  PCP/Specialist Visits Contact provider for referral to  [facility] health, discussed patient not wanting to go to MD visits, PCP/Medicare requires a face to face MD visit prior to referring  to home health.]  Contacted provider for referral to PCP  [facility] health services Chat with local pcp, left message for [facility] care support team [phone] ext [phone]]  Communication with PCP/Specialists  Exercise Interventions   Exercise Discussed/Reviewed Exercise Reviewed, Physical Activity  [with decreased mobility per wife]  Physical Activity Discussed/Reviewed Physical Activity Reviewed  Education Interventions   Education Provided Provided Education  [facility] health ordering process, medicare face to face , visting home MDs, possible VA services]  Provided Verbal Education On Nutrition, Mental Health/Coping with Illness, Community Resources  Mental Health Interventions   Mental Health Discussed/Reviewed Mental Health Reviewed, Other, Coping Strategies  [patient with increased memory loss per wife]  Nutrition Interventions   Nutrition Discussed/Reviewed Nutrition Reviewed, Fluid intake, Supplemental nutrition, Increasing proteins  Pharmacy Interventions   Pharmacy Dicussed/Reviewed Pharmacy Topics Reviewed, Affording Medications, Medications and their functions  Safety Interventions   Safety Discussed/Reviewed Safety Reviewed, Home Safety  Home Safety Assistive Devices  Advanced Directive Interventions   Advanced Directives Discussed/Reviewed Advanced Directives Discussed  [previously provided forms Pending completion]              SDOH assessments and interventions completed:  Yes     Care Coordination Interventions:  Yes, provided   Follow up plan: Follow up call scheduled for 06/03/23    Encounter Outcome:  Patient Visit Completed   Suzen L. Ramonita, RN, BSN, CCM [place]  [facility], [facility] Health RN Care Manager  Direct Dial: [phone]  Fax: [phone] Mailing Address: [address]  [place] [address] Website: [email or web address]

## 2023-06-02 ENCOUNTER — Inpatient Hospital Stay (HOSPITAL_COMMUNITY): Payer: No Typology Code available for payment source

## 2023-06-02 ENCOUNTER — Inpatient Hospital Stay: Admit: 2023-06-02 | Payer: Medicare HMO | Admitting: Pulmonary Disease

## 2023-06-02 ENCOUNTER — Encounter (HOSPITAL_COMMUNITY): Payer: Self-pay | Admitting: Pulmonary Disease

## 2023-06-02 ENCOUNTER — Other Ambulatory Visit: Payer: Self-pay

## 2023-06-02 ENCOUNTER — Encounter (HOSPITAL_COMMUNITY): Payer: Self-pay

## 2023-06-02 ENCOUNTER — Inpatient Hospital Stay (HOSPITAL_COMMUNITY)
Admission: EM | Admit: 2023-06-02 | Discharge: 2023-06-22 | DRG: 871 | Disposition: E | Payer: No Typology Code available for payment source | Attending: Critical Care Medicine | Admitting: Critical Care Medicine

## 2023-06-02 DIAGNOSIS — Z7982 Long term (current) use of aspirin: Secondary | ICD-10-CM

## 2023-06-02 DIAGNOSIS — R918 Other nonspecific abnormal finding of lung field: Secondary | ICD-10-CM | POA: Diagnosis not present

## 2023-06-02 DIAGNOSIS — R627 Adult failure to thrive: Secondary | ICD-10-CM | POA: Diagnosis present

## 2023-06-02 DIAGNOSIS — Z794 Long term (current) use of insulin: Secondary | ICD-10-CM | POA: Diagnosis not present

## 2023-06-02 DIAGNOSIS — I4892 Unspecified atrial flutter: Secondary | ICD-10-CM | POA: Diagnosis present

## 2023-06-02 DIAGNOSIS — R571 Hypovolemic shock: Secondary | ICD-10-CM | POA: Diagnosis present

## 2023-06-02 DIAGNOSIS — N2889 Other specified disorders of kidney and ureter: Secondary | ICD-10-CM | POA: Diagnosis not present

## 2023-06-02 DIAGNOSIS — E1122 Type 2 diabetes mellitus with diabetic chronic kidney disease: Secondary | ICD-10-CM | POA: Diagnosis present

## 2023-06-02 DIAGNOSIS — N179 Acute kidney failure, unspecified: Secondary | ICD-10-CM | POA: Diagnosis not present

## 2023-06-02 DIAGNOSIS — R300 Dysuria: Secondary | ICD-10-CM | POA: Diagnosis present

## 2023-06-02 DIAGNOSIS — I440 Atrioventricular block, first degree: Secondary | ICD-10-CM | POA: Diagnosis present

## 2023-06-02 DIAGNOSIS — H9190 Unspecified hearing loss, unspecified ear: Secondary | ICD-10-CM | POA: Diagnosis present

## 2023-06-02 DIAGNOSIS — Z515 Encounter for palliative care: Secondary | ICD-10-CM | POA: Diagnosis not present

## 2023-06-02 DIAGNOSIS — E875 Hyperkalemia: Secondary | ICD-10-CM | POA: Diagnosis present

## 2023-06-02 DIAGNOSIS — R911 Solitary pulmonary nodule: Secondary | ICD-10-CM | POA: Diagnosis not present

## 2023-06-02 DIAGNOSIS — E440 Moderate protein-calorie malnutrition: Secondary | ICD-10-CM | POA: Diagnosis present

## 2023-06-02 DIAGNOSIS — R0902 Hypoxemia: Secondary | ICD-10-CM | POA: Diagnosis not present

## 2023-06-02 DIAGNOSIS — Z781 Physical restraint status: Secondary | ICD-10-CM

## 2023-06-02 DIAGNOSIS — I700 Atherosclerosis of aorta: Secondary | ICD-10-CM | POA: Diagnosis not present

## 2023-06-02 DIAGNOSIS — G9340 Encephalopathy, unspecified: Secondary | ICD-10-CM

## 2023-06-02 DIAGNOSIS — Z87891 Personal history of nicotine dependence: Secondary | ICD-10-CM

## 2023-06-02 DIAGNOSIS — R0989 Other specified symptoms and signs involving the circulatory and respiratory systems: Secondary | ICD-10-CM | POA: Diagnosis not present

## 2023-06-02 DIAGNOSIS — E872 Acidosis, unspecified: Secondary | ICD-10-CM

## 2023-06-02 DIAGNOSIS — F05 Delirium due to known physiological condition: Secondary | ICD-10-CM | POA: Diagnosis present

## 2023-06-02 DIAGNOSIS — I130 Hypertensive heart and chronic kidney disease with heart failure and stage 1 through stage 4 chronic kidney disease, or unspecified chronic kidney disease: Secondary | ICD-10-CM | POA: Diagnosis present

## 2023-06-02 DIAGNOSIS — R9431 Abnormal electrocardiogram [ECG] [EKG]: Secondary | ICD-10-CM | POA: Diagnosis not present

## 2023-06-02 DIAGNOSIS — N184 Chronic kidney disease, stage 4 (severe): Secondary | ICD-10-CM | POA: Diagnosis present

## 2023-06-02 DIAGNOSIS — E119 Type 2 diabetes mellitus without complications: Secondary | ICD-10-CM

## 2023-06-02 DIAGNOSIS — R6521 Severe sepsis with septic shock: Secondary | ICD-10-CM | POA: Diagnosis not present

## 2023-06-02 DIAGNOSIS — I5043 Acute on chronic combined systolic (congestive) and diastolic (congestive) heart failure: Secondary | ICD-10-CM | POA: Diagnosis present

## 2023-06-02 DIAGNOSIS — E8779 Other fluid overload: Secondary | ICD-10-CM | POA: Diagnosis not present

## 2023-06-02 DIAGNOSIS — Z888 Allergy status to other drugs, medicaments and biological substances status: Secondary | ICD-10-CM

## 2023-06-02 DIAGNOSIS — Z66 Do not resuscitate: Secondary | ICD-10-CM | POA: Diagnosis present

## 2023-06-02 DIAGNOSIS — Z1152 Encounter for screening for COVID-19: Secondary | ICD-10-CM

## 2023-06-02 DIAGNOSIS — Z8546 Personal history of malignant neoplasm of prostate: Secondary | ICD-10-CM

## 2023-06-02 DIAGNOSIS — R579 Shock, unspecified: Secondary | ICD-10-CM | POA: Diagnosis not present

## 2023-06-02 DIAGNOSIS — Z955 Presence of coronary angioplasty implant and graft: Secondary | ICD-10-CM

## 2023-06-02 DIAGNOSIS — I129 Hypertensive chronic kidney disease with stage 1 through stage 4 chronic kidney disease, or unspecified chronic kidney disease: Secondary | ICD-10-CM | POA: Diagnosis not present

## 2023-06-02 DIAGNOSIS — Z79899 Other long term (current) drug therapy: Secondary | ICD-10-CM

## 2023-06-02 DIAGNOSIS — I517 Cardiomegaly: Secondary | ICD-10-CM | POA: Diagnosis not present

## 2023-06-02 DIAGNOSIS — R000 Tachycardia, unspecified: Secondary | ICD-10-CM | POA: Diagnosis not present

## 2023-06-02 DIAGNOSIS — I959 Hypotension, unspecified: Secondary | ICD-10-CM

## 2023-06-02 DIAGNOSIS — D631 Anemia in chronic kidney disease: Secondary | ICD-10-CM | POA: Diagnosis present

## 2023-06-02 DIAGNOSIS — K219 Gastro-esophageal reflux disease without esophagitis: Secondary | ICD-10-CM | POA: Diagnosis present

## 2023-06-02 DIAGNOSIS — E1142 Type 2 diabetes mellitus with diabetic polyneuropathy: Secondary | ICD-10-CM | POA: Diagnosis present

## 2023-06-02 DIAGNOSIS — Z5982 Transportation insecurity: Secondary | ICD-10-CM

## 2023-06-02 DIAGNOSIS — R410 Disorientation, unspecified: Secondary | ICD-10-CM | POA: Diagnosis not present

## 2023-06-02 DIAGNOSIS — D696 Thrombocytopenia, unspecified: Secondary | ICD-10-CM | POA: Diagnosis present

## 2023-06-02 DIAGNOSIS — E785 Hyperlipidemia, unspecified: Secondary | ICD-10-CM | POA: Diagnosis present

## 2023-06-02 DIAGNOSIS — N281 Cyst of kidney, acquired: Secondary | ICD-10-CM | POA: Diagnosis not present

## 2023-06-02 DIAGNOSIS — R0602 Shortness of breath: Secondary | ICD-10-CM | POA: Diagnosis not present

## 2023-06-02 DIAGNOSIS — Z7984 Long term (current) use of oral hypoglycemic drugs: Secondary | ICD-10-CM | POA: Diagnosis not present

## 2023-06-02 DIAGNOSIS — N1831 Chronic kidney disease, stage 3a: Secondary | ICD-10-CM

## 2023-06-02 DIAGNOSIS — Z8249 Family history of ischemic heart disease and other diseases of the circulatory system: Secondary | ICD-10-CM

## 2023-06-02 DIAGNOSIS — E1165 Type 2 diabetes mellitus with hyperglycemia: Secondary | ICD-10-CM | POA: Diagnosis present

## 2023-06-02 DIAGNOSIS — K5790 Diverticulosis of intestine, part unspecified, without perforation or abscess without bleeding: Secondary | ICD-10-CM | POA: Diagnosis not present

## 2023-06-02 DIAGNOSIS — I214 Non-ST elevation (NSTEMI) myocardial infarction: Secondary | ICD-10-CM

## 2023-06-02 DIAGNOSIS — Z20822 Contact with and (suspected) exposure to covid-19: Secondary | ICD-10-CM | POA: Diagnosis not present

## 2023-06-02 DIAGNOSIS — Z800 Family history of malignant neoplasm of digestive organs: Secondary | ICD-10-CM

## 2023-06-02 DIAGNOSIS — R0609 Other forms of dyspnea: Secondary | ICD-10-CM | POA: Diagnosis not present

## 2023-06-02 DIAGNOSIS — F0390 Unspecified dementia without behavioral disturbance: Secondary | ICD-10-CM | POA: Diagnosis present

## 2023-06-02 DIAGNOSIS — Z452 Encounter for adjustment and management of vascular access device: Secondary | ICD-10-CM | POA: Diagnosis not present

## 2023-06-02 DIAGNOSIS — Z951 Presence of aortocoronary bypass graft: Secondary | ICD-10-CM

## 2023-06-02 DIAGNOSIS — Z6821 Body mass index (BMI) 21.0-21.9, adult: Secondary | ICD-10-CM

## 2023-06-02 DIAGNOSIS — G9341 Metabolic encephalopathy: Secondary | ICD-10-CM | POA: Diagnosis present

## 2023-06-02 DIAGNOSIS — Z7189 Other specified counseling: Secondary | ICD-10-CM | POA: Diagnosis not present

## 2023-06-02 DIAGNOSIS — K5730 Diverticulosis of large intestine without perforation or abscess without bleeding: Secondary | ICD-10-CM | POA: Diagnosis not present

## 2023-06-02 DIAGNOSIS — Z885 Allergy status to narcotic agent status: Secondary | ICD-10-CM

## 2023-06-02 DIAGNOSIS — E8722 Chronic metabolic acidosis: Secondary | ICD-10-CM | POA: Diagnosis not present

## 2023-06-02 DIAGNOSIS — N189 Chronic kidney disease, unspecified: Secondary | ICD-10-CM | POA: Diagnosis present

## 2023-06-02 DIAGNOSIS — A419 Sepsis, unspecified organism: Principal | ICD-10-CM

## 2023-06-02 DIAGNOSIS — I2510 Atherosclerotic heart disease of native coronary artery without angina pectoris: Secondary | ICD-10-CM | POA: Diagnosis present

## 2023-06-02 DIAGNOSIS — Z604 Social exclusion and rejection: Secondary | ICD-10-CM | POA: Diagnosis present

## 2023-06-02 DIAGNOSIS — D649 Anemia, unspecified: Secondary | ICD-10-CM | POA: Diagnosis not present

## 2023-06-02 DIAGNOSIS — Z923 Personal history of irradiation: Secondary | ICD-10-CM

## 2023-06-02 DIAGNOSIS — E11649 Type 2 diabetes mellitus with hypoglycemia without coma: Secondary | ICD-10-CM | POA: Diagnosis present

## 2023-06-02 DIAGNOSIS — R4182 Altered mental status, unspecified: Secondary | ICD-10-CM | POA: Diagnosis not present

## 2023-06-02 DIAGNOSIS — E860 Dehydration: Secondary | ICD-10-CM | POA: Diagnosis present

## 2023-06-02 DIAGNOSIS — K5090 Crohn's disease, unspecified, without complications: Secondary | ICD-10-CM | POA: Diagnosis present

## 2023-06-02 DIAGNOSIS — R001 Bradycardia, unspecified: Secondary | ICD-10-CM | POA: Diagnosis not present

## 2023-06-02 LAB — BASIC METABOLIC PANEL
Anion gap: 16 — ABNORMAL HIGH (ref 5–15)
Anion gap: 20 — ABNORMAL HIGH (ref 5–15)
BUN: 90 mg/dL — ABNORMAL HIGH (ref 8–23)
BUN: 81 mg/dL — ABNORMAL HIGH (ref 8–23)
CO2: 7 mmol/L — ABNORMAL LOW (ref 22–32)
CO2: 12 mmol/L — ABNORMAL LOW (ref 22–32)
Calcium: 7.7 mg/dL — ABNORMAL LOW (ref 8.9–10.3)
Calcium: 7.3 mg/dL — ABNORMAL LOW (ref 8.9–10.3)
Chloride: 114 mmol/L — ABNORMAL HIGH (ref 98–111)
Chloride: 107 mmol/L (ref 98–111)
Creatinine, Ser: 5.76 mg/dL — ABNORMAL HIGH (ref 0.61–1.24)
Creatinine, Ser: 5.11 mg/dL — ABNORMAL HIGH (ref 0.61–1.24)
GFR, Estimated: 9 mL/min — ABNORMAL LOW (ref >60)
GFR, Estimated: 11 mL/min — ABNORMAL LOW (ref >60)
Glucose, Bld: 142 mg/dL — ABNORMAL HIGH (ref 70–99)
Glucose, Bld: 229 mg/dL — ABNORMAL HIGH (ref 70–99)
Potassium: 5.4 mmol/L — ABNORMAL HIGH (ref 3.5–5.1)
Potassium: 4 mmol/L (ref 3.5–5.1)
Sodium: 137 mmol/L (ref 135–145)
Sodium: 139 mmol/L (ref 135–145)

## 2023-06-02 LAB — COMPREHENSIVE METABOLIC PANEL
ALT: 25 U/L (ref 0–44)
AST: 39 U/L (ref 15–41)
Albumin: 2.8 g/dL — ABNORMAL LOW (ref 3.5–5.0)
Alkaline Phosphatase: 79 U/L (ref 38–126)
BUN: 88 mg/dL — ABNORMAL HIGH (ref 8–23)
CO2: <7 mmol/L — ABNORMAL LOW (ref 22–32)
Calcium: 7.8 mg/dL — ABNORMAL LOW (ref 8.9–10.3)
Chloride: 115 mmol/L — ABNORMAL HIGH (ref 98–111)
Creatinine, Ser: 5.7 mg/dL — ABNORMAL HIGH (ref 0.61–1.24)
GFR, Estimated: 10 mL/min — ABNORMAL LOW (ref >60)
Glucose, Bld: 141 mg/dL — ABNORMAL HIGH (ref 70–99)
Potassium: 5.6 mmol/L — ABNORMAL HIGH (ref 3.5–5.1)
Sodium: 137 mmol/L (ref 135–145)
Total Bilirubin: 1.1 mg/dL (ref 0.0–1.2)
Total Protein: 6 g/dL — ABNORMAL LOW (ref 6.5–8.1)

## 2023-06-02 LAB — CBC
HCT: 27.6 % — ABNORMAL LOW (ref 39.0–52.0)
HCT: 25.9 % — ABNORMAL LOW (ref 39.0–52.0)
Hemoglobin: 8.5 g/dL — ABNORMAL LOW (ref 13.0–17.0)
Hemoglobin: 8.2 g/dL — ABNORMAL LOW (ref 13.0–17.0)
MCH: 31.5 pg (ref 26.0–34.0)
MCH: 31.8 pg (ref 26.0–34.0)
MCHC: 30.8 g/dL (ref 30.0–36.0)
MCHC: 31.7 g/dL (ref 30.0–36.0)
MCV: 102.2 fL — ABNORMAL HIGH (ref 80.0–100.0)
MCV: 100.4 fL — ABNORMAL HIGH (ref 80.0–100.0)
Platelets: 148 10*3/uL — ABNORMAL LOW (ref 150–400)
Platelets: 166 10*3/uL (ref 150–400)
RBC: 2.7 MIL/uL — ABNORMAL LOW (ref 4.22–5.81)
RBC: 2.58 MIL/uL — ABNORMAL LOW (ref 4.22–5.81)
RDW: 13.6 % (ref 11.5–15.5)
RDW: 13.7 % (ref 11.5–15.5)
WBC: 13.6 10*3/uL — ABNORMAL HIGH (ref 4.0–10.5)
WBC: 16.9 10*3/uL — ABNORMAL HIGH (ref 4.0–10.5)
nRBC: 0 % (ref 0.0–0.2)
nRBC: 0 % (ref 0.0–0.2)

## 2023-06-02 LAB — CBC WITH DIFFERENTIAL/PLATELET
Abs Immature Granulocytes: 0.15 10*3/uL — ABNORMAL HIGH (ref 0.00–0.07)
Basophils Absolute: 0 10*3/uL (ref 0.0–0.1)
Basophils Relative: 0 %
Eosinophils Absolute: 0 10*3/uL (ref 0.0–0.5)
Eosinophils Relative: 0 %
HCT: 28.1 % — ABNORMAL LOW (ref 39.0–52.0)
Hemoglobin: 8.7 g/dL — ABNORMAL LOW (ref 13.0–17.0)
Immature Granulocytes: 1 %
Lymphocytes Relative: 6 %
Lymphs Abs: 0.8 10*3/uL (ref 0.7–4.0)
MCH: 31.8 pg (ref 26.0–34.0)
MCHC: 31 g/dL (ref 30.0–36.0)
MCV: 102.6 fL — ABNORMAL HIGH (ref 80.0–100.0)
Monocytes Absolute: 0.2 10*3/uL (ref 0.1–1.0)
Monocytes Relative: 2 %
Neutro Abs: 12.4 10*3/uL — ABNORMAL HIGH (ref 1.7–7.7)
Neutrophils Relative %: 91 %
Platelets: 146 10*3/uL — ABNORMAL LOW (ref 150–400)
RBC: 2.74 MIL/uL — ABNORMAL LOW (ref 4.22–5.81)
RDW: 13.7 % (ref 11.5–15.5)
WBC: 13.5 10*3/uL — ABNORMAL HIGH (ref 4.0–10.5)
nRBC: 0 % (ref 0.0–0.2)

## 2023-06-02 LAB — HEPARIN LEVEL (UNFRACTIONATED)
Heparin Unfractionated: >1.1 [IU]/mL — ABNORMAL HIGH (ref 0.30–0.70)
Heparin Unfractionated: >1.1 [IU]/mL — ABNORMAL HIGH (ref 0.30–0.70)

## 2023-06-02 LAB — I-STAT VENOUS BLOOD GAS, ED
Acid-base deficit: 24 mmol/L — ABNORMAL HIGH (ref 0.0–2.0)
Bicarbonate: 7 mmol/L — ABNORMAL LOW (ref 20.0–28.0)
Calcium, Ion: 1.17 mmol/L (ref 1.15–1.40)
HCT: 26 % — ABNORMAL LOW (ref 39.0–52.0)
Hemoglobin: 8.8 g/dL — ABNORMAL LOW (ref 13.0–17.0)
O2 Saturation: 85 %
Potassium: 5.5 mmol/L — ABNORMAL HIGH (ref 3.5–5.1)
Sodium: 139 mmol/L (ref 135–145)
TCO2: 8 mmol/L — ABNORMAL LOW (ref 22–32)
pCO2, Ven: 31.4 mm[Hg] — ABNORMAL LOW (ref 44–60)
pH, Ven: 6.953 — CL (ref 7.25–7.43)
pO2, Ven: 77 mm[Hg] — ABNORMAL HIGH (ref 32–45)

## 2023-06-02 LAB — POCT I-STAT 7, (LYTES, BLD GAS, ICA,H+H)
Acid-base deficit: 25 mmol/L — ABNORMAL HIGH (ref 0.0–2.0)
Bicarbonate: 4.9 mmol/L — ABNORMAL LOW (ref 20.0–28.0)
Calcium, Ion: 1.24 mmol/L (ref 1.15–1.40)
HCT: 24 % — ABNORMAL LOW (ref 39.0–52.0)
Hemoglobin: 8.2 g/dL — ABNORMAL LOW (ref 13.0–17.0)
O2 Saturation: 94 %
Potassium: 5.5 mmol/L — ABNORMAL HIGH (ref 3.5–5.1)
Sodium: 138 mmol/L (ref 135–145)
TCO2: 5 mmol/L — ABNORMAL LOW (ref 22–32)
pCO2 arterial: 20 mm[Hg] — ABNORMAL LOW (ref 32–48)
pH, Arterial: 6.996 — CL (ref 7.35–7.45)
pO2, Arterial: 107 mm[Hg] (ref 83–108)

## 2023-06-02 LAB — GLUCOSE, CAPILLARY
Glucose-Capillary: 124 mg/dL — ABNORMAL HIGH (ref 70–99)
Glucose-Capillary: 163 mg/dL — ABNORMAL HIGH (ref 70–99)
Glucose-Capillary: 201 mg/dL — ABNORMAL HIGH (ref 70–99)
Glucose-Capillary: 201 mg/dL — ABNORMAL HIGH (ref 70–99)

## 2023-06-02 LAB — BRAIN NATRIURETIC PEPTIDE: B Natriuretic Peptide: 456.4 pg/mL — ABNORMAL HIGH (ref 0.0–100.0)

## 2023-06-02 LAB — CORTISOL: Cortisol, Plasma: 44.2 ug/dL

## 2023-06-02 LAB — PROCALCITONIN: Procalcitonin: 0.24 ng/mL

## 2023-06-02 LAB — TYPE AND SCREEN
ABO/RH(D): O POS
Antibody Screen: NEGATIVE

## 2023-06-02 LAB — TROPONIN I (HIGH SENSITIVITY)
Troponin I (High Sensitivity): 3427 ng/L (ref <18)
Troponin I (High Sensitivity): 3466 ng/L (ref <18)

## 2023-06-02 LAB — MRSA NEXT GEN BY PCR, NASAL: MRSA by PCR Next Gen: NOT DETECTED

## 2023-06-02 LAB — MAGNESIUM: Magnesium: 2.1 mg/dL (ref 1.7–2.4)

## 2023-06-02 LAB — I-STAT CG4 LACTIC ACID, ED: Lactic Acid, Venous: 0.7 mmol/L (ref 0.5–1.9)

## 2023-06-02 MED ORDER — MIDAZOLAM HCL 2 MG/2ML IJ SOLN
INTRAMUSCULAR | Status: AC
  Administered 2023-06-02: 2 mg
  Filled 2023-06-02: qty 2

## 2023-06-02 MED ORDER — HEPARIN SODIUM (PORCINE) 1000 UNIT/ML DIALYSIS
1000.0000 [IU] | INTRAMUSCULAR | Status: DC | PRN
  Filled 2023-06-02: qty 6
  Filled 2023-06-02: qty 3

## 2023-06-02 MED ORDER — ASPIRIN 81 MG PO TBEC
81.0000 mg | DELAYED_RELEASE_TABLET | Freq: Every day | ORAL | Status: DC
  Filled 2023-06-02: qty 1

## 2023-06-02 MED ORDER — LORAZEPAM 2 MG/ML IJ SOLN
0.5000 mg | Freq: Once | INTRAMUSCULAR | Status: AC
  Administered 2023-06-02: 0.5 mg via INTRAVENOUS
  Filled 2023-06-02: qty 1

## 2023-06-02 MED ORDER — INSULIN ASPART 100 UNIT/ML IJ SOLN
0.0000 [IU] | INTRAMUSCULAR | Status: DC
  Administered 2023-06-02 (×2): 3 [IU] via SUBCUTANEOUS
  Administered 2023-06-02 – 2023-06-03 (×2): 2 [IU] via SUBCUTANEOUS
  Administered 2023-06-03 (×3): 1 [IU] via SUBCUTANEOUS
  Administered 2023-06-03: 2 [IU] via SUBCUTANEOUS
  Administered 2023-06-04: 1 [IU] via SUBCUTANEOUS
  Administered 2023-06-04: 2 [IU] via SUBCUTANEOUS

## 2023-06-02 MED ORDER — STERILE WATER FOR INJECTION IV SOLN
INTRAVENOUS | Status: DC
  Filled 2023-06-02 (×4): qty 150

## 2023-06-02 MED ORDER — HALOPERIDOL LACTATE 5 MG/ML IJ SOLN: 5.0000 mg | Freq: Four times a day (QID) | INTRAMUSCULAR | Status: DC | PRN

## 2023-06-02 MED ORDER — SODIUM BICARBONATE 8.4 % IV SOLN
INTRAVENOUS | Status: AC
  Administered 2023-06-02: 100 meq via INTRAVENOUS
  Filled 2023-06-02: qty 100

## 2023-06-02 MED ORDER — PRISMASOL BGK 4/2.5 32-4-2.5 MEQ/L EC SOLN: Status: DC

## 2023-06-02 MED ORDER — HEPARIN BOLUS VIA INFUSION
4000.0000 [IU] | Freq: Once | INTRAVENOUS | Status: AC
  Administered 2023-06-02: 4000 [IU] via INTRAVENOUS
  Filled 2023-06-02: qty 4000

## 2023-06-02 MED ORDER — ROSUVASTATIN CALCIUM 20 MG PO TABS: 20.0000 mg | ORAL_TABLET | Freq: Every day | ORAL | Status: DC

## 2023-06-02 MED ORDER — SODIUM BICARBONATE 8.4 % IV SOLN
INTRAVENOUS | Status: DC
  Filled 2023-06-02 (×3): qty 1000

## 2023-06-02 MED ORDER — DOCUSATE SODIUM 100 MG PO CAPS: 100.0000 mg | ORAL_CAPSULE | Freq: Two times a day (BID) | ORAL | Status: DC | PRN

## 2023-06-02 MED ORDER — DONEPEZIL HCL 5 MG PO TABS
5.0000 mg | ORAL_TABLET | Freq: Every day | ORAL | Status: DC
  Filled 2023-06-02 (×2): qty 1

## 2023-06-02 MED ORDER — VANCOMYCIN VARIABLE DOSE PER UNSTABLE RENAL FUNCTION (PHARMACIST DOSING): Status: DC

## 2023-06-02 MED ORDER — SODIUM CHLORIDE 0.9 % IV SOLN
2.0000 g | Freq: Two times a day (BID) | INTRAVENOUS | Status: DC
  Administered 2023-06-02 – 2023-06-03 (×2): 2 g via INTRAVENOUS
  Filled 2023-06-02 (×2): qty 12.5

## 2023-06-02 MED ORDER — SODIUM BICARBONATE 8.4 % IV SOLN: 100.0000 meq | Freq: Once | INTRAVENOUS | Status: AC

## 2023-06-02 MED ORDER — CHLORHEXIDINE GLUCONATE CLOTH 2 % EX PADS
6.0000 | MEDICATED_PAD | Freq: Every day | CUTANEOUS | Status: DC
  Administered 2023-06-02 – 2023-06-04 (×3): 6 via TOPICAL

## 2023-06-02 MED ORDER — VANCOMYCIN HCL 750 MG/150ML IV SOLN
750.0000 mg | INTRAVENOUS | Status: DC
  Administered 2023-06-03: 750 mg via INTRAVENOUS
  Filled 2023-06-02: qty 150

## 2023-06-02 MED ORDER — ORAL CARE MOUTH RINSE
15.0000 mL | OROMUCOSAL | Status: DC | PRN
Start: 2023-06-02 — End: 2023-06-04

## 2023-06-02 MED ORDER — SERTRALINE HCL 50 MG PO TABS: 150.0000 mg | ORAL_TABLET | Freq: Every day | ORAL | Status: DC

## 2023-06-02 MED ORDER — LACTATED RINGERS IV SOLN: INTRAVENOUS | Status: DC

## 2023-06-02 MED ORDER — STERILE WATER FOR INJECTION IV SOLN
INTRAVENOUS | Status: DC
  Filled 2023-06-02 (×3): qty 150

## 2023-06-02 MED ORDER — POLYETHYLENE GLYCOL 3350 17 G PO PACK: 17.0000 g | PACK | Freq: Every day | ORAL | Status: DC | PRN

## 2023-06-02 MED ORDER — PANTOPRAZOLE SODIUM 40 MG IV SOLR
40.0000 mg | Freq: Two times a day (BID) | INTRAVENOUS | Status: DC
  Administered 2023-06-02 – 2023-06-03 (×4): 40 mg via INTRAVENOUS
  Filled 2023-06-02 (×4): qty 10

## 2023-06-02 MED ORDER — SODIUM CHLORIDE 0.9 % IV SOLN
1.0000 g | INTRAVENOUS | Status: DC
  Administered 2023-06-02: 1 g via INTRAVENOUS
  Filled 2023-06-02: qty 10

## 2023-06-02 MED ORDER — SODIUM ZIRCONIUM CYCLOSILICATE 5 G PO PACK
5.0000 g | PACK | Freq: Once | ORAL | Status: DC
  Filled 2023-06-02: qty 1

## 2023-06-02 MED ORDER — HEPARIN (PORCINE) 25000 UT/250ML-% IV SOLN
1000.0000 [IU]/h | INTRAVENOUS | Status: DC
  Administered 2023-06-02: 1000 [IU]/h via INTRAVENOUS
  Filled 2023-06-02: qty 250

## 2023-06-02 MED ORDER — VANCOMYCIN HCL 500 MG/100ML IV SOLN
500.0000 mg | Freq: Once | INTRAVENOUS | Status: AC
  Administered 2023-06-02: 500 mg via INTRAVENOUS
  Filled 2023-06-02: qty 100

## 2023-06-02 NOTE — Procedures (Signed)
 Central Venous Catheter Insertion Procedure Note  [name]  [id number]  08-18-1945  Date:06/02/23  Time:4:41 PM   Provider Performing:Pete FORBES Jenna   Procedure: Insertion of Non-tunneled Central Venous Catheter(36556)with US  guidance (23062)    Indication(s) Hemodialysis  Consent Risks of the procedure as well as the alternatives and risks of each were explained to the patient and/or caregiver.  Consent for the procedure was obtained and is signed in the bedside chart  Anesthesia Topical only with 1% lidocaine    Timeout Verified patient identification, verified procedure, site/side was marked, verified correct patient position, special equipment/implants available, medications/allergies/relevant history reviewed, required imaging and test results available.  Sterile Technique Maximal sterile technique including full sterile barrier drape, hand hygiene, sterile gown, sterile gloves, mask, hair covering, sterile ultrasound probe cover (if used).  Procedure Description Area of catheter insertion was cleaned with chlorhexidine  and draped in sterile fashion.   With real-time ultrasound guidance a HD catheter was placed into the right internal jugular vein.  Nonpulsatile blood flow and easy flushing noted in all ports.  The catheter was sutured in place and sterile dressing applied.  Complications/Tolerance None; patient tolerated the procedure well. Chest X-ray is ordered to verify placement for internal jugular or subclavian cannulation.  Chest x-ray is not ordered for femoral cannulation.  EBL Minimal  Specimen(s) None

## 2023-06-02 NOTE — Progress Notes (Signed)
 Pharmacy Antibiotic Note  [name] is a 78 y.o. male admitted on 06/02/2023 as transfer from [facility], concern for sepsis.  Pharmacy has been consulted for vancomycin  and cefepime  dosing.  Pt starting on CRRT 2/9 afternoon.   Plan: Vancomycin  750mg  IV q24h. Next dose 2/10 1600 Change Cefepime  to 2gm IV q12h Monitor renal function, Cx and clinical progression to narrow Vancomycin  levels as needed  Weight: 73.7 kg (162 lb 7.7 oz)  Temp (24hrs), Avg:95.5 F (35.3 C), Min:95.5 F (35.3 C), Max:95.5 F (35.3 C)  Recent Labs  Lab 06/02/23 1051 06/02/23 1117  WBC 13.6*  13.5*  --   CREATININE 5.76*  5.70*  --   LATICACIDVEN  --  0.7    Estimated Creatinine Clearance: 11.3 mL/min (A) (by C-G formula based on SCr of 5.7 mg/dL (H)).    Allergies  Allergen Reactions   Buprenorphine Other (See Comments)   Buprenorphine Hcl Other (See Comments)   Flomax [Tamsulosin Hcl]     UNSPECIFIED REACTION to HIGH DOSE    Morphine And Codeine     UNSPECIFIED REACTION     Vito Ralph, PharmD, BCPS Please see amion for complete clinical pharmacist phone list 06/02/2023 4:28 PM

## 2023-06-02 NOTE — Plan of Care (Signed)
   Problem: Education: Goal: Ability to describe self-care measures that may prevent or decrease complications (Diabetes Survival Skills Education) will improve Outcome: Progressing   Problem: Coping: Goal: Ability to adjust to condition or change in health will improve Outcome: Progressing   Problem: Fluid Volume: Goal: Ability to maintain a balanced intake and output will improve Outcome: Progressing

## 2023-06-02 NOTE — Consult Note (Signed)
 Cardiology Consultation   Patient ID: [name] MRN: [id number]; DOB: December 06, 1945  Admit date: 06/02/2023 Date of Consult: 06/02/2023  PCP:  Cook, Jayce G, DO   [facility] Providers Cardiologist:  Peter Jordan, MD        Patient Profile:   [name] is a 78 y.o. male with a hx of coronary artery disease, CABG, hypertension, hyperlipidemia, PAD, type 2 diabetes mellitus, Crohn's disease.  He recently presented to [facility] with a sepsis syndrome.  He was transferred to [facility] in [place].  Who is being seen 06/02/2023 for the evaluation of elevated troponins at the request of Dr. Theophilus. .  History of Present Illness:   [name] is a 78 year old gentleman with a history of coronary artery disease, hypertension, hyperlipidemia, PAD, diabetes, and previous smoker.  He was recently seen at [facility] with sepsis syndrome.  Troponins were noted to be elevated. He was transferred to [facility] in [place] and we are asked to assess him.  He presented to the [facility] in December with symptoms consistent with failure to thrive, acute on chronic renal failure  His ABG is significantly abnormal.  His pH is 6.99 pCO2 is 20 pO2 is 107  His creatinine is 5.7. Albumin  is 2.8. WBC is 13.6   Troponin levels are 3466.  He has altered mental status.  He is unable to answer any questions.    Past Medical History:  Diagnosis Date   Adenocarcinoma of prostate (HCC)    Anxiety    CAD (coronary artery disease)    a. s/p prior stenting of LAD in 1996 and angioplasty alone in 1999 to LAD by review of prior notes. b. 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    Crohn's disease (HCC)    CTS (carpal tunnel syndrome)    Mild   Depression    Diabetes mellitus without complication (HCC)    Type 2   Hyperlipidemia    Hypertension    Kidney stone    Optic neuritis      Past Surgical History:  Procedure Laterality Date   COLONOSCOPY     CORONARY ARTERY BYPASS GRAFT N/A 04/01/2018   Procedure: CORONARY ARTERY BYPASS GRAFTING (CABG) TIMES FOUR: (LIMA to LAD, CRYO VEIN to OM1, SVG to OM2, and SVG to PDA) with PARTIAL EVH/OPEN from RIGHT and LEFT GREATER SAPHENOUS VEIN and LEFT INTERNAL MAMMARY ARTERY;  Surgeon: Fleeta Hanford Coy, MD;  Location: [facility] OR;  Service: Open Heart Surgery;  Laterality: N/A;   KNEE CARTILAGE SURGERY Right    LEFT HEART CATH AND CORONARY ANGIOGRAPHY N/A 03/27/2018   Procedure: LEFT HEART CATH AND CORONARY ANGIOGRAPHY;  Surgeon: Burnard Debby LABOR, MD;  Location: [facility] INVASIVE CV LAB;  Service: Cardiovascular;  Laterality: N/A;   LOWER EXTREMITY ANGIOGRAPHY N/A 08/13/2016   Procedure: Lower Extremity Angiography;  Surgeon: Dorn JINNY Lesches, MD;  Location: [facility] INVASIVE CV LAB;  Service: Cardiovascular;  Laterality: N/A;   NASAL SINUS SURGERY     PROSTATE SURGERY     TEE WITHOUT CARDIOVERSION N/A 04/01/2018   Procedure: TRANSESOPHAGEAL ECHOCARDIOGRAM (TEE);  Surgeon: Fleeta Hanford, Coy, MD;  Location: [facility] OR;  Service: Open Heart Surgery;  Laterality: N/A;     Home Medications:  Prior to Admission medications   Medication Sig Start Date End Date Taking? Authorizing Provider  albuterol  (VENTOLIN  HFA) 108 (90 Base) MCG/ACT inhaler Inhale 2 puffs into  the lungs every 4 (four) hours as needed for wheezing or shortness of breath (cough). 01/11/22   Pearlean Manus, MD  aspirin  EC 81 MG tablet Take 1 tablet (81 mg total) by mouth daily with breakfast. 01/11/22   Emokpae, Courage, MD  Cholecalciferol  25 MCG (1000 UT) CHEW Chew 25 Units by mouth daily.    [provider]  donepezil  (ARICEPT ) 5 MG tablet Take 1 tablet (5 mg total) by mouth at bedtime. 12/08/21   Cook, Jayce G, DO  glimepiride  (AMARYL ) 2 MG tablet Take 1 tablet (2 mg total) by mouth 2 (two) times daily with a meal. Patient taking differently: Take 5 mg by mouth daily. 01/11/22 01/11/23   Pearlean Manus, MD  insulin  glargine (LANTUS  SOLOSTAR) 100 UNIT/ML Solostar Pen Inject 20 Units into the skin at bedtime. Patient taking differently: Inject 14 Units into the skin at bedtime. 02/26/22   Cook, Jayce G, DO  Insulin  Pen Needle 32G X 4 MM MISC 1 Needle by Does not apply route at bedtime. For insulin  administration 01/11/22   Pearlean Manus, MD  losartan  (COZAAR ) 25 MG tablet Take 1 tablet (25 mg total) by mouth daily. 03/30/23 03/29/24  Willette Adriana LABOR, MD  mesalamine  (LIALDA ) 1.2 g EC tablet Take 1.2 g by mouth 2 (two) times daily.    [provider]  mirtazapine  (REMERON ) 7.5 MG tablet Take 1 tablet (7.5 mg total) by mouth at bedtime. 01/11/22   Pearlean Manus, MD  omeprazole  (PRILOSEC) 20 MG capsule Take 1 capsule (20 mg total) by mouth daily. 01/11/22   Pearlean Manus, MD  propranolol  (INDERAL ) 40 MG tablet Take 1 tablet (40 mg total) by mouth 2 (two) times daily. 01/11/22   Pearlean Manus, MD  risperiDONE  (RISPERDAL ) 1 MG tablet Take 0.5 tablets (0.5 mg total) by mouth 2 (two) times daily. 09/27/22   Cook, Jayce G, DO  rosuvastatin  (CRESTOR ) 20 MG tablet Take 1 tablet (20 mg total) by mouth daily. 02/28/23   Cook, Jayce G, DO  sertraline  (ZOLOFT ) 100 MG tablet Take 1.5 tablets (150 mg total) by mouth daily. 12/04/22   Cook, Jayce G, DO    Inpatient Medications: Scheduled Meds:  [START ON 06/03/2023] aspirin  EC  81 mg Oral Q breakfast   Chlorhexidine  Gluconate Cloth  6 each Topical Daily   donepezil   5 mg Oral QHS   insulin  aspart  0-9 Units Subcutaneous Q4H   pantoprazole  (PROTONIX ) IV  40 mg Intravenous Q12H   rosuvastatin   20 mg Oral Daily   sertraline   150 mg Oral Daily   sodium zirconium cyclosilicate   5 g Oral Once   vancomycin  variable dose per unstable renal function (pharmacist dosing)   Does not apply See admin instructions   Continuous Infusions:  ceFEPime  (MAXIPIME ) IV 1 g (06/02/23 1436)   heparin  1,000 Units/hr (06/02/23 1235)   sodium  bicarbonate 150 mEq in dextrose  5 % 1,150 mL infusion 125 mL/hr at 06/02/23 1328   vancomycin      PRN Meds: docusate sodium , mouth rinse, polyethylene glycol  Allergies:    Allergies  Allergen Reactions   Buprenorphine Other (See Comments)   Buprenorphine Hcl Other (See Comments)   Flomax [Tamsulosin Hcl]     UNSPECIFIED REACTION to HIGH DOSE    Morphine And Codeine     UNSPECIFIED REACTION     Social History:   Social History   Socioeconomic History   Marital status: Married    Spouse name: Not on file   Number of children: Not  on file   Years of education: 1   Highest education level: Not on file  Occupational History   Occupation: curator  Tobacco Use   Smoking status: Former    Current packs/day: 0.00    Types: Cigarettes    Quit date: 02/19/2018    Years since quitting: 5.2   Smokeless tobacco: Never  Vaping Use   Vaping status: Never Used  Substance and Sexual Activity   Alcohol  use: No   Drug use: No   Sexual activity: Not on file  Other Topics Concern   Not on file  Social History Narrative   Not on file   Social Drivers of Health   Financial Resource Strain: Low Risk  (01/24/2022)   Overall Financial Resource Strain (CARDIA)    Difficulty of Paying Living Expenses: Not hard at all  Food Insecurity: No Food Insecurity (03/29/2023)   Hunger Vital Sign    Worried About Running Out of Food in the Last Year: Never true    Ran Out of Food in the Last Year: Never true  Transportation Needs: No Transportation Needs (03/29/2023)   PRAPARE - Administrator, Civil Service (Medical): No    Lack of Transportation (Non-Medical): No  Recent Concern: Transportation Needs - Unmet Transportation Needs (02/27/2023)   PRAPARE - Administrator, Civil Service (Medical): Yes    Lack of Transportation (Non-Medical): No  Physical Activity: Not on file  Stress: No Stress Concern Present (01/24/2022)   [employer] of Occupational Health -  Occupational Stress Questionnaire    Feeling of Stress : Only a little  Social Connections: Socially Isolated (01/26/2022)   Social Connection and Isolation Panel [NHANES]    Frequency of Communication with Friends and Family: Once a week    Frequency of Social Gatherings with Friends and Family: Once a week    Attends Religious Services: Never    Database Administrator or Organizations: No    Attends Banker Meetings: Never    Marital Status: Married  Catering Manager Violence: Not At Risk (03/29/2023)   Humiliation, Afraid, Rape, and Kick questionnaire    Fear of Current or Ex-Partner: No    Emotionally Abused: No    Physically Abused: No    Sexually Abused: No    Family History:    Family History  Problem Relation Age of Onset   Hypertension Mother    Colon cancer Mother    Heart attack Father    Coronary artery disease Father    Hypertension Sister    Crohn's disease Brother      ROS:  Please see the history of present illness.   All other ROS reviewed and negative.     Physical Exam/Data:   Vitals:   06/02/23 1245 06/02/23 1300 06/02/23 1315 06/02/23 1400  BP: (!) 95/55 (!) 93/55 (!) 90/55   Pulse: 87 85 93   Resp: 19 (!) 27 (!) 29   Temp:      TempSrc:      SpO2: 100% 100% 100%   Weight:    73.7 kg   No intake or output data in the 24 hours ending 06/02/23 1445    06/02/2023    2:00 PM 03/29/2023    1:43 AM 03/28/2023    2:53 PM  Last 3 Weights  Weight (lbs) 162 lb 7.7 oz 168 lb 3.4 oz 170 lb  Weight (kg) 73.7 kg 76.3 kg 77.111 kg     Body mass index  is 22.66 kg/m.  General:  chronically ill gentleman,  somulent , unable to answer questions  HEENT: normal Neck: no JVD Vascular: No carotid bruits; Distal pulses 2+ bilaterally Cardiac:  RR , soft systolic murmur  Lungs:  limited respiratory effort  Abd: soft, nontender, no hepatomegaly  Ext: no edema Musculoskeletal:  no edema   Skin: warm and dry  Neuro:  unable to assess  Psych:   altered mental status   EKG:  The EKG was personally reviewed and demonstrates:  NSR .  Markedly prolonged PR interval  ST elevation in the inferior leads.  ST depression in the lateral leads    Telemetry:  Telemetry was personally reviewed and demonstrates:  ? Sinus rhythm   Relevant CV Studies:   Laboratory Data:  High Sensitivity Troponin:   Recent Labs  Lab 06/02/23 1051  TROPONINIHS 3,466*  3,427*     Chemistry Recent Labs  Lab 06/02/23 1051 06/02/23 1116 06/02/23 1431  NA 137  137 139 138  K 5.4*  5.6* 5.5* 5.5*  CL 114*  115*  --   --   CO2 7*  <7*  --   --   GLUCOSE 142*  141*  --   --   BUN 90*  88*  --   --   CREATININE 5.76*  5.70*  --   --   CALCIUM  7.7*  7.8*  --   --   MG 2.1  --   --   GFRNONAA 9*  10*  --   --   ANIONGAP 16*  NOT CALCULATED  --   --     Recent Labs  Lab 06/02/23 1051  PROT 6.0*  ALBUMIN  2.8*  AST 39  ALT 25  ALKPHOS 79  BILITOT 1.1   Lipids No results for input(s): CHOL, TRIG, HDL, LABVLDL, LDLCALC, CHOLHDL in the last 168 hours.  Hematology Recent Labs  Lab 06/02/23 1051 06/02/23 1116 06/02/23 1431  WBC 13.6*  13.5*  --   --   RBC 2.70*  2.74*  --   --   HGB 8.5*  8.7* 8.8* 8.2*  HCT 27.6*  28.1* 26.0* 24.0*  MCV 102.2*  102.6*  --   --   MCH 31.5  31.8  --   --   MCHC 30.8  31.0  --   --   RDW 13.6  13.7  --   --   PLT 148*  146*  --   --    Thyroid  No results for input(s): TSH, FREET4 in the last 168 hours.  BNP Recent Labs  Lab 06/02/23 1051  BNP 456.4*    DDimer No results for input(s): DDIMER in the last 168 hours.   Radiology/Studies:  No results found.   Assessment and Plan:   1.  Elevated troponin levels: Patient has a known history of coronary artery disease and coronary artery bypass grafting.  He now presents with sepsis syndrome, altered mental status, acute renal failure.  He is markedly acidotic.  He has an elevated troponin level. He has mild ST  segment elevation in the inferior leads with ST segment depression in the anterolateral leads.  He is not a candidate for heart catheterization.  He is not a candidate for a coronary CTA or Myoview  study.. Echocardiogram has been ordered.  At this point there is really nothing further for us  to do from a cardiology standpoint.  He is not a candidate for heart catheterization.  Will get an echo to assess  his LV function which may help with his prognosis. His long-term prognosis is extremely poor.  2.  Sepsis syndrome.  He has a very complex presentation.  He he has sepsis syndrome, he has altered mental status, profound acidemia.  Continue supportive care per the pulmonary critical care team.     Risk Assessment/Risk Scores:     TIMI Risk Score for Unstable Angina or Non-ST Elevation MI:   The patient's TIMI risk score is  , which indicates a  % risk of all cause mortality, new or recurrent myocardial infarction or need for urgent revascularization in the next 14 days.          For questions or updates, please contact [facility] Please consult [email or web address] for contact info under    Signed, Aleene Passe, MD  06/02/2023 2:45 PM

## 2023-06-02 NOTE — Progress Notes (Signed)
 PHARMACY - ANTICOAGULATION CONSULT NOTE  Pharmacy Consult for heparin  Indication: chest pain/ACS  Allergies  Allergen Reactions   Buprenorphine Other (See Comments)   Buprenorphine Hcl Other (See Comments)   Flomax [Tamsulosin Hcl]     UNSPECIFIED REACTION to HIGH DOSE    Morphine And Codeine     UNSPECIFIED REACTION     Patient Measurements:   Heparin  Dosing Weight: TBW  Vital Signs: Temp: 95.5 F (35.3 C) (02/09 1107) Temp Source: Rectal (02/09 1107) BP: 111/59 (02/09 1100) Pulse Rate: 83 (02/09 1100)  Labs: Recent Labs    06/02/23 1051 06/02/23 1116  HGB 8.7* 8.8*  HCT 28.1* 26.0*  PLT 146*  --     CrCl cannot be calculated (Patient's most recent lab result is older than the maximum 21 days allowed.).   Medical History: Past Medical History:  Diagnosis Date   Adenocarcinoma of prostate (HCC)    Anxiety    CAD (coronary artery disease)    a. s/p prior stenting of LAD in 1996 and angioplasty alone in 1999 to LAD by review of prior notes. b. 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    Crohn's disease (HCC)    CTS (carpal tunnel syndrome)    Mild   Depression    Diabetes mellitus without complication (HCC)    Type 2   Hyperlipidemia    Hypertension    Kidney stone    Optic neuritis     Assessment: 49 YOM transfer from [facility], concern for NSTEMI, he is not on anticoagulation PTA, chronic anemia stable  Goal of Therapy:  Heparin  level 0.3-0.7 units/ml Monitor platelets by anticoagulation protocol: Yes   Plan:  Heparin  4000 units IV x 1, and gtt at 1000 units/hr F/u 8 hour heparin  level F/u cards eval and recs  Dorn Poot, PharmD, [facility] Clinical Pharmacist ED Pharmacist Phone # [phone] 06/02/2023 12:10 PM

## 2023-06-02 NOTE — H&P (Addendum)
 NAME:  [name], MRN:  [id number], DOB:  06/24/1945, LOS: 0 ADMISSION DATE:  06/02/2023, CONSULTATION DATE: 2/9 REFERRING MD: UNK Rouse, CHIEF COMPLAINT: Acute on chronic renal failure hypotension, abnormal EKG and elevated troponin  History of Present Illness:  This is a 78 year old male patient with several comorbidities recently hospitalized back in December 2024 for acute on chronic renal failure resides at home, presented to [facility] emergency room with report of decreased oral intake, nausea and vomiting, worsening weakness, and some resting shortness of breath, some lethargy, and mild encephalopathy.  On initial EMS arrival he was hypotensive with systolic blood pressure in the 70s to 80s on arrival to the emergency room diagnostic evaluation included CT head which was negative for acute process, CT chest abdomen and pelvis was obtained there was no acute abnormality identified he did have a 4 mm right upper lobe pulmonary nodule COVID was negative influenza negative urinalysis with marked protein urea UDS negative sodium 143 anion gap greater than 15 creatinine 6.10, on record from his discharge in December creatinine appears to sit in the range from 2.7-2.8, HCO3 13.4 White blood cell count 11.7 Troponin I Initially 33, 6 hours spiked to 295 EKG showing concern for age unspecific anterior infarct and ST abnormality in the lateral leads He was started on IV hydration Placed on telemetry Cultures sent Empiric antibiotics administered Transferred to [facility] for definitive care critical care asked to admit  Pertinent  Medical History  CKD stage III a, poorly controlled diabetes with neuropathy, essential hypertension, dyslipidemia, GERD with esophagitis, recent elevated troponin during admission in December 2024, coronary artery disease with prior PCI and stents, Crohn's disease, anxiety, depression, Significant Hospital Events: Including procedures, antibiotic  start and stop dates in addition to other pertinent events   2/9 transferred ER to ER from [facility] to [facility] with chief complaint of weakness, vomiting, poor p.o. intake found to have hypotension acute on chronic renal failure anion gap metabolic acidosis borderline hyperkalemia and EKG changes worrisome for possible cardiac ischemia transferred to [facility] for evaluation  Interim History / Subjective:  Lying in bed no acute distress  Objective   Blood pressure (!) 111/59, pulse 83, temperature (!) 95.5 F (35.3 C), temperature source Rectal, resp. rate (!) 22, SpO2 100%.       No intake or output data in the 24 hours ending 06/02/23 1122 There were no vitals filed for this visit.  Examination: General: Chronically ill-appearing 78 year old white male he is pale lying supine not labored currently HENT: Mucous membranes are pale dry and cracked sclera nonicteric, no clear JVD Lungs: Clear currently room air no accessory use Cardiovascular: Regular rate and rhythm currently normal sinus with first-degree AV block and ST depression in lateral leads Abdomen: Soft not tender bowel sounds present Extremities: Warm dry no significant edema Neuro: Lethargic but awake follows commands but generalized weakness very hard of hearing, slow to respond, oriented x 2-3 but clearly encephalopathic and a poor historian GU: Due to void  Resolved Hospital Problem list     Assessment & Plan:  Acute on chronic renal failure, baseline CKD stage IIIa -Poor p.o. intake certainly a contributing factor with associated hypotension and dehydration.  Did report some dysuria, although his ability to provide history is limited.  CT abdomen pelvis negative for hydro Plan Continuing IV hydration Repeating admission chemistry Strict intake output, already has Foley catheter Send urine culture  Hypotension, seems to be resolving with volume so hypovolemic shock most likely  contributor, although cannot rule out  sepsis Currently lactic acid negative at 0.7 Plan Continuing IV hydration Admit to the intensive care for now Culture blood and urine Send procalcitonin Send BNP Send cortisol Repeat lactate Will continue empiric antibiotics for now, although not convinced he is infected We will hold his Cozaar  and Inderal   Severe metabolic acidosis, positive anion gap Interestingly lactate is negative, and his glucose is normal .  Suspect this is either related to his acute on chronic renal injury, likely complicated at least to some extent by bicarbonate loss from vomiting but I also wonder about starvation ketosis Plan Will repeat blood chemistry now that he has received IV hydration, transitioning to bicarbonate infusion Checking beta hydroxybutyric acid Serial labs Follow-up blood gas later today  Non-ST elevation MI with elevated troponin known history of CAD with prior PCI and stents Most likely demand ischemia however has had marked troponin Escalation here in the emergency room initially less than 20 spiked at 6 hours to 295 Plan Repeating twelve-lead Echocardiogram Continue aspirin  IV heparin  for now Cardiology consultation Continue statin  Acute metabolic encephalopathy.  Looks like he may have some underlying dementia as home medications include Aricept , however clearly being exacerbated by uremia and acidosis Plan Holding his Risperdal  for now Holding his nocturnal Remeron  for now Okay to continue Zoloft , 150 mg a day Treat acidosis and shock Continue supportive care  Type 2 diabetes, insulin -dependent with neuropathy Plan Holding his Amaryl  Sliding scale insulin  Goal 140-180  History of Crohn's disease and GERD Plan Serial CBC to ensure no GI bleeding Hemoccult stool Continue PPI hold mesalamine     Best Practice (right click and Reselect all SmartList Selections daily)   Diet/type: NPO w/ oral meds DVT prophylaxis systemic heparin  Pressure ulcer(s): N/A GI  prophylaxis: PPI Lines: N/A Foley:  Yes, and it is still needed Code Status:  full code Last date of multidisciplinary goals of care discussion [pending]  Labs   CBC: Recent Labs  Lab 06/02/23 1116  HGB 8.8*  HCT 26.0*    Basic Metabolic Panel: Recent Labs  Lab 06/02/23 1116  NA 139  K 5.5*   GFR: CrCl cannot be calculated (Patient's most recent lab result is older than the maximum 21 days allowed.). Recent Labs  Lab 06/02/23 1117  LATICACIDVEN 0.7    Liver Function Tests: No results for input(s): AST, ALT, ALKPHOS, BILITOT, PROT, ALBUMIN  in the last 168 hours. No results for input(s): LIPASE, AMYLASE in the last 168 hours. No results for input(s): AMMONIA in the last 168 hours.  ABG    Component Value Date/Time   PHART 7.288 (L) 04/01/2018 2341   PCO2ART 44.5 04/01/2018 2341   PO2ART 137.0 (H) 04/01/2018 2341   HCO3 7.0 (L) 06/02/2023 1116   TCO2 8 (L) 06/02/2023 1116   ACIDBASEDEF 24.0 (H) 06/02/2023 1116   O2SAT 85 06/02/2023 1116     Coagulation Profile: No results for input(s): INR, PROTIME in the last 168 hours.  Cardiac Enzymes: No results for input(s): CKTOTAL, CKMB, CKMBINDEX, TROPONINI in the last 168 hours.  HbA1C: Hgb A1c MFr Bld  Date/Time Value Ref Range Status  03/29/2023 10:43 AM 5.4 4.8 - 5.6 % Final    Comment:    (NOTE) Pre diabetes:          5.7%-6.4%  Diabetes:              >6.4%  Glycemic control for   <7.0% adults with diabetes   11/09/2022 05:43 PM 6.0 (H)  4.8 - 5.6 % Final    Comment:             Prediabetes: 5.7 - 6.4          Diabetes: >6.4          Glycemic control for adults with diabetes: <7.0     CBG: No results for input(s): GLUCAP in the last 168 hours.  Review of Systems:   His mental status is somewhat blunted.  Able to tell me he has not been eating, has had poor p.o. intake, has had nausea.  Fairly vague about shortness of breath endorses he is short of breath at rest.   Reports cough productive of clear mucus at times yellow-tinged, reports vague back pain.  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   Surgical History:   Past Surgical History:  Procedure Laterality Date   COLONOSCOPY     CORONARY ARTERY BYPASS GRAFT N/A 04/01/2018   Procedure: CORONARY ARTERY BYPASS GRAFTING (CABG) TIMES FOUR: (LIMA to LAD, CRYO VEIN to OM1, SVG to OM2, and SVG to PDA) with PARTIAL EVH/OPEN from RIGHT and LEFT GREATER SAPHENOUS VEIN and LEFT INTERNAL MAMMARY ARTERY;  Surgeon: Fleeta Hanford Coy, MD;  Location: [facility] OR;  Service: Open Heart Surgery;  Laterality: N/A;   KNEE CARTILAGE SURGERY Right    LEFT HEART CATH AND CORONARY ANGIOGRAPHY N/A 03/27/2018   Procedure: LEFT HEART CATH AND CORONARY ANGIOGRAPHY;  Surgeon: Burnard Debby LABOR, MD;  Location: [facility] INVASIVE CV LAB;  Service: Cardiovascular;  Laterality: N/A;   LOWER EXTREMITY ANGIOGRAPHY N/A 08/13/2016   Procedure: Lower Extremity Angiography;  Surgeon: Dorn JINNY Lesches, MD;  Location[facility] INVASIVE CV LAB;  Service: Cardiovascular;  Laterality: N/A;   NASAL SINUS SURGERY     PROSTATE SURGERY     TEE WITHOUT CARDIOVERSION N/A 04/01/2018   Procedure: TRANSESOPHAGEAL ECHOCARDIOGRAM (TEE);  Surgeon: Fleeta Hanford, Coy, MD;  [facility];  Service: Open Heart Surgery;  Laterality: N/A;     Social History:   reports that he quit smoking about 5 years ago. His smoking use included cigarettes. He has never used smokeless tobacco. He reports that he does not drink alcohol  and does not use drugs.   Family History:  His family history includes Colon cancer in his mother; Coronary artery  disease in his father; Crohn's disease in his brother; Heart attack in his father; Hypertension in his mother and sister.   Allergies Allergies  Allergen Reactions   Buprenorphine Other (See Comments)   Buprenorphine Hcl Other (See Comments)   Flomax [Tamsulosin Hcl]     UNSPECIFIED REACTION to HIGH DOSE    Morphine And Codeine     UNSPECIFIED REACTION      Home Medications  Prior to Admission medications   Medication Sig Start Date End Date Taking? Authorizing Provider  albuterol  (VENTOLIN  HFA) 108 (90 Base) MCG/ACT inhaler Inhale 2 puffs into the lungs every 4 (four) hours as needed for wheezing or shortness of breath (cough). 01/11/22   Pearlean Manus, MD  aspirin  EC 81 MG tablet Take 1 tablet (81 mg total) by mouth daily with breakfast. 01/11/22   Emokpae, Courage, MD  Cholecalciferol  25 MCG (1000 UT) CHEW Chew 25 Units by mouth  daily.    [provider]  donepezil  (ARICEPT ) 5 MG tablet Take 1 tablet (5 mg total) by mouth at bedtime. 12/08/21   Cook, Jayce G, DO  glimepiride  (AMARYL ) 2 MG tablet Take 1 tablet (2 mg total) by mouth 2 (two) times daily with a meal. Patient taking differently: Take 5 mg by mouth daily. 01/11/22 01/11/23  Pearlean Manus, MD  insulin  glargine (LANTUS  SOLOSTAR) 100 UNIT/ML Solostar Pen Inject 20 Units into the skin at bedtime. Patient taking differently: Inject 14 Units into the skin at bedtime. 02/26/22   Cook, Jayce G, DO  Insulin  Pen Needle 32G X 4 MM MISC 1 Needle by Does not apply route at bedtime. For insulin  administration 01/11/22   Pearlean Manus, MD  losartan  (COZAAR ) 25 MG tablet Take 1 tablet (25 mg total) by mouth daily. 03/30/23 03/29/24  Willette Adriana LABOR, MD  mesalamine  (LIALDA ) 1.2 g EC tablet Take 1.2 g by mouth 2 (two) times daily.    [provider]  mirtazapine  (REMERON ) 7.5 MG tablet Take 1 tablet (7.5 mg total) by mouth at bedtime. 01/11/22   Pearlean Manus, MD  omeprazole  (PRILOSEC) 20 MG capsule Take 1 capsule (20  mg total) by mouth daily. 01/11/22   Pearlean Manus, MD  propranolol  (INDERAL ) 40 MG tablet Take 1 tablet (40 mg total) by mouth 2 (two) times daily. 01/11/22   Pearlean Manus, MD  risperiDONE  (RISPERDAL ) 1 MG tablet Take 0.5 tablets (0.5 mg total) by mouth 2 (two) times daily. 09/27/22   Cook, Jayce G, DO  rosuvastatin  (CRESTOR ) 20 MG tablet Take 1 tablet (20 mg total) by mouth daily. 02/28/23   Cook, Jayce G, DO  sertraline  (ZOLOFT ) 100 MG tablet Take 1.5 tablets (150 mg total) by mouth daily. 12/04/22   Cook, Jayce G, DO     Critical care time: 55 min

## 2023-06-02 NOTE — Progress Notes (Signed)
 Pharmacy Antibiotic Note  [name] is a 78 y.o. male admitted on 06/02/2023 as transfer from [facility], concern for sepsis.  Pharmacy has been consulted for vancomycin  and cefepime  dosing.  At prior facility received vancomycin  1g IV 2/9 @0200     Plan: Vancomycin  500 mg IV x 1, then variable dosing d/t unstable renal function Cefepime  1g IV q 24h Monitor renal function, Cx and clinical progression to narrow Vancomycin  levels as needed     Temp (24hrs), Avg:95.5 F (35.3 C), Min:95.5 F (35.3 C), Max:95.5 F (35.3 C)  Recent Labs  Lab 06/02/23 1051 06/02/23 1117  WBC 13.5*  --   CREATININE 5.70*  --   LATICACIDVEN  --  0.7    CrCl cannot be calculated (Unknown ideal weight.).    Allergies  Allergen Reactions   Buprenorphine Other (See Comments)   Buprenorphine Hcl Other (See Comments)   Flomax [Tamsulosin Hcl]     UNSPECIFIED REACTION to HIGH DOSE    Morphine And Codeine     UNSPECIFIED REACTION     Dorn Poot, PharmD, [facility] Clinical Pharmacist ED Pharmacist Phone # [phone] 06/02/2023 12:30 PM

## 2023-06-02 NOTE — Progress Notes (Signed)
 He is now in the intensive care   Blood pressure continues to hold with systolic above 100 heart rate in the 80s Current laboratory data back Sodium 137, potassium 5.6, chloride 115, bicarbonate less than 7, glucose 141, creatinine 5.7 with BUN of 88, the his creatinine was above 6 Troponin I 3427, BNP 456, lactate remains negative Blood gas pH 6.996, pCO2 20, pO2 107 bicarbonate 5 He is already on a bicarbonate drip  Most recent potassium was still over 5  Will push 2 amps bicarb and touch base w/ nephro.  May be best to just have a dialysis catheter in today

## 2023-06-02 NOTE — ED Provider Notes (Signed)
 [place] EMERGENCY DEPARTMENT AT [facility] Provider Note   CSN: [id number] Arrival date & time: 06/02/23  1043     History  No chief complaint on file.   [name] is a 78 y.o. male.  Pt is a 78 yo male with pmhx significant for anxiety, hld, htn, dm2, kidney stones, CAD, prostate cancer, and hx crohn's disease.  Pt presented to the ED in [place] early this am around 0100.  Pt initially presented with confusion.  BP was low (BP in the 80s) and temp was low (95), so sepsis work up was initiated.  He's been given 6L of NS and was given zosyn  and vancomycin .  He was also agitated, so he was given haldol  and benadryl .  He pulled out several IVs while there and also pulled out his foley once.  This was replaced.  He was put on a bair hugger, but temp is still 95.  It appears that source of sepsis has not yet been found.  He was found to have an AKI with Cr 6.1 (Cr 2.71 on 12/7).  Also, he developed significant ST depression with increase in troponins while there.  Doctors there spoke with CCM here (Dr. Theophilus) who accepted him for transfer, but there are not any ICU beds.  PT was also d/w Dr. Wonda (cards) as it sounds like they were concerned for a STEMI.  He is not having a stemi, but cards will see pt when he gets here. Pt is unable to give me any hx.         Home Medications Prior to Admission medications   Medication Sig Start Date End Date Taking? Authorizing Provider  albuterol  (VENTOLIN  HFA) 108 (90 Base) MCG/ACT inhaler Inhale 2 puffs into the lungs every 4 (four) hours as needed for wheezing or shortness of breath (cough). 01/11/22   Pearlean Manus, MD  aspirin  EC 81 MG tablet Take 1 tablet (81 mg total) by mouth daily with breakfast. 01/11/22   Emokpae, Courage, MD  Cholecalciferol  25 MCG (1000 UT) CHEW Chew 25 Units by mouth daily.    [provider]  donepezil  (ARICEPT ) 5 MG tablet Take 1 tablet (5 mg total) by mouth at bedtime. 12/08/21   Cook,  Jayce G, DO  glimepiride  (AMARYL ) 2 MG tablet Take 1 tablet (2 mg total) by mouth 2 (two) times daily with a meal. Patient taking differently: Take 5 mg by mouth daily. 01/11/22 01/11/23  Pearlean Manus, MD  insulin  glargine (LANTUS  SOLOSTAR) 100 UNIT/ML Solostar Pen Inject 20 Units into the skin at bedtime. Patient taking differently: Inject 14 Units into the skin at bedtime. 02/26/22   Cook, Jayce G, DO  Insulin  Pen Needle 32G X 4 MM MISC 1 Needle by Does not apply route at bedtime. For insulin  administration 01/11/22   Pearlean Manus, MD  losartan  (COZAAR ) 25 MG tablet Take 1 tablet (25 mg total) by mouth daily. 03/30/23 03/29/24  Willette Adriana LABOR, MD  mesalamine  (LIALDA ) 1.2 g EC tablet Take 1.2 g by mouth 2 (two) times daily.    [provider]  mirtazapine  (REMERON ) 7.5 MG tablet Take 1 tablet (7.5 mg total) by mouth at bedtime. 01/11/22   Pearlean Manus, MD  omeprazole  (PRILOSEC) 20 MG capsule Take 1 capsule (20 mg total) by mouth daily. 01/11/22   Pearlean Manus, MD  propranolol  (INDERAL ) 40 MG tablet Take 1 tablet (40 mg total) by mouth 2 (two) times daily. 01/11/22   Pearlean Manus, MD  risperiDONE  (  RISPERDAL ) 1 MG tablet Take 0.5 tablets (0.5 mg total) by mouth 2 (two) times daily. 09/27/22   Cook, Jayce G, DO  rosuvastatin  (CRESTOR ) 20 MG tablet Take 1 tablet (20 mg total) by mouth daily. 02/28/23   Cook, Jayce G, DO  sertraline  (ZOLOFT ) 100 MG tablet Take 1.5 tablets (150 mg total) by mouth daily. 12/04/22   Cook, Jayce G, DO      Allergies    Buprenorphine, Buprenorphine hcl, Flomax [tamsulosin hcl], and Morphine and codeine    Review of Systems   Review of Systems  Unable to perform ROS: Mental status change    Physical Exam Updated Vital Signs BP (!) 111/59   Pulse 83   Temp (!) 95.5 F (35.3 C) (Rectal)   Resp (!) 22   SpO2 100%  Physical Exam Vitals and nursing note reviewed.  Constitutional:      Appearance: He is ill-appearing.  HENT:     Head:  Normocephalic and atraumatic.     Right Ear: External ear normal.     Left Ear: External ear normal.     Nose: Nose normal.     Mouth/Throat:     Mouth: Mucous membranes are dry.  Eyes:     Extraocular Movements: Extraocular movements intact.     Conjunctiva/sclera: Conjunctivae normal.     Pupils: Pupils are equal, round, and reactive to light.  Cardiovascular:     Rate and Rhythm: Regular rhythm. Tachycardia present.     Pulses: Normal pulses.     Heart sounds: Normal heart sounds.  Pulmonary:     Effort: Pulmonary effort is normal.     Breath sounds: Normal breath sounds.  Abdominal:     General: Abdomen is flat. Bowel sounds are normal.     Palpations: Abdomen is soft.  Musculoskeletal:        General: Normal range of motion.     Cervical back: Normal range of motion and neck supple.  Skin:    General: Skin is warm.     Capillary Refill: Capillary refill takes less than 2 seconds.  Neurological:     Mental Status: He is disoriented.     Comments: Pt is moving all 4 extremities, but he is not following commands.  He can't tell me his name.  Psychiatric:        Behavior: Behavior is agitated.     ED Results / Procedures / Treatments   Labs (all labs ordered are listed, but only abnormal results are displayed) Labs Reviewed  COMPREHENSIVE METABOLIC PANEL - Abnormal; Notable for the following components:      Result Value   Potassium 5.6 (*)    Chloride 115 (*)    CO2 <7 (*)    Glucose, Bld 141 (*)    BUN 88 (*)    Creatinine, Ser 5.70 (*)    Calcium  7.8 (*)    Total Protein 6.0 (*)    Albumin  2.8 (*)    GFR, Estimated 10 (*)    All other components within normal limits  CBC WITH DIFFERENTIAL/PLATELET - Abnormal; Notable for the following components:   WBC 13.5 (*)    RBC 2.74 (*)    Hemoglobin 8.7 (*)    HCT 28.1 (*)    MCV 102.6 (*)    Platelets 146 (*)    Neutro Abs 12.4 (*)    Abs Immature Granulocytes 0.15 (*)    All other components within normal  limits  I-STAT VENOUS BLOOD GAS, ED -  Abnormal; Notable for the following components:   pH, Ven 6.953 (*)    pCO2, Ven 31.4 (*)    pO2, Ven 77 (*)    Bicarbonate 7.0 (*)    TCO2 8 (*)    Acid-base deficit 24.0 (*)    Potassium 5.5 (*)    HCT 26.0 (*)    Hemoglobin 8.8 (*)    All other components within normal limits  TROPONIN I (HIGH SENSITIVITY) - Abnormal; Notable for the following components:   Troponin I (High Sensitivity) 3,427 (*)    All other components within normal limits  CULTURE, BLOOD (ROUTINE X 2)  CULTURE, BLOOD (ROUTINE X 2)  MAGNESIUM   PROCALCITONIN  BRAIN NATRIURETIC PEPTIDE  CORTISOL  URINALYSIS, W/ REFLEX TO CULTURE (INFECTION SUSPECTED)  CBC  CBC  BLOOD GAS, ARTERIAL  BASIC METABOLIC PANEL  BETA-HYDROXYBUTYRIC ACID  HEPARIN  LEVEL (UNFRACTIONATED)  I-STAT CG4 LACTIC ACID, ED  I-STAT CG4 LACTIC ACID, ED  I-STAT CG4 LACTIC ACID, ED  TYPE AND SCREEN  TROPONIN I (HIGH SENSITIVITY)    EKG EKG Interpretation Date/Time:  Sunday June 02 2023 12:17:43 EST Ventricular Rate:  87 PR Interval:    QRS Duration:  103 QT Interval:  360 QTC Calculation: 433 R Axis:   -36  Text Interpretation: Accelerated junctional rhythm Inferior infarct, old Probable anterolateral infarct, age indeterm No significant change since last tracing Confirmed by Dean Clarity [phone]) on 06/02/2023 12:38:36 PM  Radiology No results found.  Procedures Procedures    Medications Ordered in ED Medications  docusate sodium  (COLACE) capsule 100 mg (has no administration in time range)  polyethylene glycol (MIRALAX  / GLYCOLAX ) packet 17 g (has no administration in time range)  pantoprazole  (PROTONIX ) injection 40 mg (has no administration in time range)  insulin  aspart (novoLOG ) injection 0-9 Units (has no administration in time range)  aspirin  EC tablet 81 mg (has no administration in time range)  rosuvastatin  (CRESTOR ) tablet 20 mg (has no administration in time range)   donepezil  (ARICEPT ) tablet 5 mg (has no administration in time range)  sertraline  (ZOLOFT ) tablet 150 mg (has no administration in time range)  sodium bicarbonate  150 mEq in dextrose  5 % 1,150 mL infusion (has no administration in time range)  sodium zirconium cyclosilicate  (LOKELMA ) packet 5 g (has no administration in time range)  heparin  ADULT infusion 100 units/mL (25000 units/250mL) (1,000 Units/hr Intravenous New Bag/Given 06/02/23 1235)  LORazepam  (ATIVAN ) injection 0.5 mg (0.5 mg Intravenous Given 06/02/23 1141)  heparin  bolus via infusion 4,000 Units (4,000 Units Intravenous Bolus from Bag 06/02/23 1235)    ED Course/ Medical Decision Making/ A&P                                 Medical Decision Making Amount and/or Complexity of Data Reviewed Labs: ordered.  Risk Prescription drug management. Decision regarding hospitalization.   This patient presents to the ED for concern of ams, this involves an extensive number of treatment options, and is a complaint that carries with it a high risk of complications and morbidity.  The differential diagnosis includes sepsis, nstemi, electrolyte abn   Co morbidities that complicate the patient evaluation  anxiety, hld, htn, dm2, kidney stones, CAD, prostate cancer, and hx crohn's disease   Additional history obtained:  Additional history obtained from epic chart review External records from outside source obtained and reviewed including EMS report   Lab Tests:  I Ordered, and personally interpreted labs.  The pertinent results include:  cbc with hgb 13.5, hgb 8.7 (hgb 9.6 in Dec); VBG with pH low at 6.9, pCO2 31.4; lactic 0.7; cmp with k 5.6, bun 88 and cr 5.7 (cr 2.71 in Dec), trop up to 3427   Imaging Studies ordered:  CT scans and xrays done earlier today reviewed by me. I agree with the radiologist interpretation   Cardiac Monitoring:  The patient was maintained on a cardiac monitor.  I personally viewed and interpreted  the cardiac monitored which showed an underlying rhythm of: nsr   Medicines ordered and prescription drug management:  I ordered medication including ativan   for agitation  Reevaluation of the patient after these medicines showed that the patient improved I have reviewed the patients home medicines and have made adjustments as needed  Consultations Obtained:  I requested consultation with CCM,  and discussed lab and imaging findings as well as pertinent plan - they will admit Pt d/w Dr. Debera (cards) who will see pt in consult   Problem List / ED Course:  Metabolic acidosis:  possibly from renal failure.  CCM started a bicarb gtt NSTEMI:  EKG changes are improving, but troponin is going up.  Cards will consult.  Heparin  started. Acute on Chronic renal failure:  likely from poor oral intake per notes from [place].   Anemia: chronic.  Likely from ckd. Hypotension:  improving with fluids.  He is also hypothermic, so sepsis can't be ruled out.  He's been given abx earlier today. Encephalopathy:  metabolic   Reevaluation:  After the interventions noted above, I reevaluated the patient and found that they have :stayed the same   Social Determinants of Health:  Lives at home with his wife   Dispostion:  After consideration of the diagnostic results and the patients response to treatment, I feel that the patent would benefit from admission.  CRITICAL CARE Performed by: Mliss Boyers   Total critical care time: 30 minutes  Critical care time was exclusive of separately billable procedures and treating other patients.  Critical care was necessary to treat or prevent imminent or life-threatening deterioration.  Critical care was time spent personally by me on the following activities: development of treatment plan with patient and/or surrogate as well as nursing, discussions with consultants, evaluation of patient's response to treatment, examination of patient, obtaining  history from patient or surrogate, ordering and performing treatments and interventions, ordering and review of laboratory studies, ordering and review of radiographic studies, pulse oximetry and re-evaluation of patient's condition.           Final Clinical Impression(s) / ED Diagnoses Final diagnoses:  AKI (acute kidney injury) (HCC)  NSTEMI (non-ST elevated myocardial infarction) (HCC)  Sepsis with acute renal failure and septic shock, due to unspecified organism, unspecified acute renal failure type (HCC)  Encephalopathy, unspecified type  Metabolic acidosis    Rx / DC Orders ED Discharge Orders     None         Boyers Mliss, MD 06/02/23 1246

## 2023-06-02 NOTE — Consult Note (Signed)
 Renal Service Consult Note [facility]  [name] 06/02/2023 Paul JONETTA Fret, MD Requesting Physician: Dr. Theophilus  Reason for Consult: Renal failure HPI: The patient is a 78 y.o. year-old w/ PMH as below who presented this am to ED sent from [facility] as code sepsis w/ hypotension and AMS.  Pt got 5 L fluids, some haldol  and benadryl  IV. Hx of dementia and CKD is reported.  Hx was that he presented to OSH w/ poor po intake, N/V, worsening weakness, some lethargy and mild encephalopathy. On EMS arrival bp's were 70s-80s. CT head neg, CT a/p w/o anything acute. COVID/ flu neg.  Na 143, AG > 15, creat 6.1 (range 2.7- 2.8 from dec 2024). WBC 11, trop 33 --> 295. EKG showed unspecific ant infarct. Pt was started on IV hydration, cx's sent and IV abx given. Pt was tx'd to [facility] for CCM admission. At [facility] pt Is getting IVF's, IV abx. ARB and BB are on hold. BNP, procalcitonin and cortisol were sent, urine and blood cxs ordered. LA at 0.7.  Felt to be possibly hypovolemic shock. Trop here spiked to 3427 and IV heparin  to be started. ABG returned pH 6.99 and we are asked to see for CRRT.    Pt seen in room. Pt is obtunded, on RA, disheveled. Doesn't provide any hx.   ROS - n/a  Past Medical History  Past Medical History:  Diagnosis Date   Adenocarcinoma of prostate (HCC)    Anxiety    CAD (coronary artery disease)    a. s/p prior stenting of LAD in 1996 and angioplasty alone in 1999 to LAD by review of prior notes. b. 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    Crohn's disease (HCC)    CTS (carpal tunnel syndrome)    Mild   Depression    Diabetes mellitus without complication (HCC)    Type 2   Hyperlipidemia    Hypertension    Kidney stone    Optic neuritis    Past Surgical History  Past Surgical History:  Procedure Laterality Date   COLONOSCOPY     CORONARY ARTERY BYPASS GRAFT N/A 04/01/2018    Procedure: CORONARY ARTERY BYPASS GRAFTING (CABG) TIMES FOUR: (LIMA to LAD, CRYO VEIN to OM1, SVG to OM2, and SVG to PDA) with PARTIAL EVH/OPEN from RIGHT and LEFT GREATER SAPHENOUS VEIN and LEFT INTERNAL MAMMARY ARTERY;  Surgeon: Fleeta Hanford Coy, MD;  Location[facility] OR;  Service: Open Heart Surgery;  Laterality: N/A;   KNEE CARTILAGE SURGERY Right    LEFT HEART CATH AND CORONARY ANGIOGRAPHY N/A 03/27/2018   Procedure: LEFT HEART CATH AND CORONARY ANGIOGRAPHY;  Surgeon: Burnard Debby LABOR, MD;  Location: [facility] INVASIVE CV LAB;  Service: Cardiovascular;  Laterality: N/A;   LOWER EXTREMITY ANGIOGRAPHY N/A 08/13/2016   Procedure: Lower Extremity Angiography;  Surgeon: Dorn JINNY Lesches, MD;  Location: [facility] INVASIVE CV LAB;  Service: Cardiovascular;  Laterality: N/A;   NASAL SINUS SURGERY     PROSTATE SURGERY     TEE WITHOUT CARDIOVERSION N/A 04/01/2018   Procedure: TRANSESOPHAGEAL ECHOCARDIOGRAM (TEE);  Surgeon: Fleeta Hanford, Coy, MD;  Location: [facility] OR;  Service: Open Heart Surgery;  Laterality: N/A;   Family History  Family History  Problem Relation Age of Onset   Hypertension Mother    Colon cancer Mother    Heart attack Father    Coronary artery disease Father    Hypertension  Sister    Crohn's disease Brother    Social History  reports that he quit smoking about 5 years ago. His smoking use included cigarettes. He has never used smokeless tobacco. He reports that he does not drink alcohol  and does not use drugs. Allergies  Allergies  Allergen Reactions   Buprenorphine Other (See Comments)   Buprenorphine Hcl Other (See Comments)   Flomax [Tamsulosin Hcl]     UNSPECIFIED REACTION to HIGH DOSE    Morphine And Codeine     UNSPECIFIED REACTION    Home medications Prior to Admission medications   Medication Sig Start Date End Date Taking? Authorizing Provider  albuterol  (VENTOLIN  HFA) 108 (90 Base) MCG/ACT inhaler Inhale 2 puffs into the lungs every 4 (four) hours as needed for wheezing or shortness  of breath (cough). 01/11/22   Pearlean Manus, MD  aspirin  EC 81 MG tablet Take 1 tablet (81 mg total) by mouth daily with breakfast. 01/11/22   Emokpae, Courage, MD  Cholecalciferol  25 MCG (1000 UT) CHEW Chew 25 Units by mouth daily.    [provider]  donepezil  (ARICEPT ) 5 MG tablet Take 1 tablet (5 mg total) by mouth at bedtime. 12/08/21   Cook, Jayce G, DO  glimepiride  (AMARYL ) 2 MG tablet Take 1 tablet (2 mg total) by mouth 2 (two) times daily with a meal. Patient taking differently: Take 5 mg by mouth daily. 01/11/22 01/11/23  Pearlean Manus, MD  insulin  glargine (LANTUS  SOLOSTAR) 100 UNIT/ML Solostar Pen Inject 20 Units into the skin at bedtime. Patient taking differently: Inject 14 Units into the skin at bedtime. 02/26/22   Cook, Jayce G, DO  Insulin  Pen Needle 32G X 4 MM MISC 1 Needle by Does not apply route at bedtime. For insulin  administration 01/11/22   Pearlean Manus, MD  losartan  (COZAAR ) 25 MG tablet Take 1 tablet (25 mg total) by mouth daily. 03/30/23 03/29/24  ShahmehdiAdriana LABOR, MD  mesalamine  (LIALDA ) 1.2 g EC tablet Take 1.2 g by mouth 2 (two) times daily.    [provider]  mirtazapine  (REMERON ) 7.5 MG tablet Take 1 tablet (7.5 mg total) by mouth at bedtime. 01/11/22   Pearlean Manus, MD  omeprazole  (PRILOSEC) 20 MG capsule Take 1 capsule (20 mg total) by mouth daily. 01/11/22   Pearlean Manus, MD  propranolol  (INDERAL ) 40 MG tablet Take 1 tablet (40 mg total) by mouth 2 (two) times daily. 01/11/22   Pearlean Manus, MD  risperiDONE  (RISPERDAL ) 1 MG tablet Take 0.5 tablets (0.5 mg total) by mouth 2 (two) times daily. 09/27/22   Cook, Jayce G, DO  rosuvastatin  (CRESTOR ) 20 MG tablet Take 1 tablet (20 mg total) by mouth daily. 02/28/23   Cook, Jayce G, DO  sertraline  (ZOLOFT ) 100 MG tablet Take 1.5 tablets (150 mg total) by mouth daily. 12/04/22   Bluford Jacqulyn MATSU, DO     Vitals:   06/02/23 1400 06/02/23 1430 06/02/23 1445 06/02/23 1500  BP:  (!) 88/52 (!) 105/55  (!) 100/59  Pulse:  79 81 88  Resp:  (!) 25 (!) 34 (!) 26  Temp:      TempSrc:      SpO2:  98% 92% 95%  Weight: 73.7 kg      Exam Gen elderly WM, unresponsive, RR okay,  not in distress No rash, cyanosis or gangrene Sclera anicteric, throat clear  No jvd or bruits, flat neck veins Chest clear bilat to bases, no rales/ wheezing RRR no RG Abd soft ntnd no mass  or ascites +bs GU foley in place MS no joint effusions or deformity Ext no LE or UE edema, no other edema Neuro as above, unresponsive       Renal-related home meds: - losartan  25 every day - inderal  40 bid - others: statin, PPI, insulins, glimepiride , asa, mesalamin - others: aricept , remeron , risperdal , zoloft   Date  Creat  eGFR (ml/min) 2010  0.95- 1.28 2018  1.21- 1.83 2019  0.70- 1.82 2023  1.36- 2.29 Jul-sept 2024 2.04- 2.23 30- 33 ml/min  Dec 2024 2.71- 3.03 20- 23 ml/min 06/02/23 5.70  9     UA, UNa, UCr pending   CT a/p from dec 2024 --> There is mild left renal atrophy and scarring. There is no hydronephrosis. The adrenal glands and bladder are within normal limits.    Renal US  pending     Na 137   K 5.6  CO2 < 7  BUN 88  creat 5.70   Ca 7.8  alb 2.8    AG 16   Mg 2.1   wbc 13K  Hb 8.7      CXR  2/09 - no active disease    Assessment/ Plan: AKI on CKD 4 - b/l creat 2.7- 3.0 from dec 2024, eGFR 20-23 ml/min. Creat here is 5.7 in the setting of shock and hypovolemia for this pt w/ dementia. Due to severe met acidosis will need CRRT acutely this afternoon. Have d/w CCM and RN's. Will follow.  Shock - hypovolemic primarily it appears. Will keep + 75 cc/hr w/ CRRT. Getting IV abx also w/ cx's pending.  Metabolic acidosis - severe, using NaHCO3 pre and post filter, and thru peripheral IV too until pH is > 7.2- 7.25.  Dementia - is on multiple psych medications AMS - multifactorial DM2 on insulin  H/o Crohn's      Myer Fret  MD [facility] 06/02/2023, 4:13 PM  Recent Labs  Lab 06/02/23 1051 06/02/23 1116  06/02/23 1431  HGB 8.5*  8.7* 8.8* 8.2*  ALBUMIN  2.8*  --   --   CALCIUM  7.7*  7.8*  --   --   CREATININE 5.76*  5.70*  --   --   K 5.4*  5.6* 5.5* 5.5*   Inpatient medications:  [START ON 06/03/2023] aspirin  EC  81 mg Oral Q breakfast   Chlorhexidine  Gluconate Cloth  6 each Topical Daily   donepezil   5 mg Oral QHS   insulin  aspart  0-9 Units Subcutaneous Q4H   pantoprazole  (PROTONIX ) IV  40 mg Intravenous Q12H   rosuvastatin   20 mg Oral Daily   sertraline   150 mg Oral Daily   sodium zirconium cyclosilicate   5 g Oral Once   vancomycin  variable dose per unstable renal function (pharmacist dosing)   Does not apply See admin instructions    ceFEPime  (MAXIPIME ) IV 1 g (06/02/23 1436)   heparin  1,000 Units/hr (06/02/23 1235)   sodium bicarbonate  150 mEq in dextrose  5 % 1,150 mL infusion 125 mL/hr at 06/02/23 1328   vancomycin  500 mg (06/02/23 1516)   docusate sodium , mouth rinse, polyethylene glycol

## 2023-06-02 NOTE — Procedures (Signed)
 I have reviewed the pt's issues and adjusted the CRRT session to match the needs of the patient.  Larry Poag MD  [facility] 06/02/2023, 4:53 PM

## 2023-06-02 NOTE — Progress Notes (Signed)
 PHARMACY - ANTICOAGULATION CONSULT NOTE  Pharmacy Consult for heparin  Indication: chest pain/ACS  Allergies  Allergen Reactions   Buprenorphine Other (See Comments)   Buprenorphine Hcl Other (See Comments)   Flomax [Tamsulosin Hcl]     UNSPECIFIED REACTION to HIGH DOSE    Morphine And Codeine     UNSPECIFIED REACTION     Patient Measurements: Height: 6' 2 (188 cm) Weight: 73.7 kg (162 lb 7.7 oz) IBW/kg (Calculated) : 82.2 Heparin  Dosing Weight: TBW  Vital Signs: Temp: 97.7 F (36.5 C) (02/09 1930) Temp Source: Oral (02/09 1930) BP: 109/54 (02/09 2200) Pulse Rate: 93 (02/09 2200)  Labs: Recent Labs    06/02/23 1051 06/02/23 1116 06/02/23 1431 06/02/23 1632 06/02/23 2125  HGB 8.5*  8.7* 8.8* 8.2* 8.2*  --   HCT 27.6*  28.1* 26.0* 24.0* 25.9*  --   PLT 148*  146*  --   --  166  --   HEPARINUNFRC  --   --   --   --  >1.10*  CREATININE 5.76*  5.70*  --   --   --  5.11*  TROPONINIHS 3,466*  3,427*  --   --   --   --     Estimated Creatinine Clearance: 12.6 mL/min (A) (by C-G formula based on SCr of 5.11 mg/dL (H)).   Medical History: Past Medical History:  Diagnosis Date   Adenocarcinoma of prostate (HCC)    Anxiety    CAD (coronary artery disease)    a. s/p prior stenting of LAD in 1996 and angioplasty alone in 1999 to LAD by review of prior notes. b. 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  Heparin  level >1.1 (think this was drawn from near where heparin  was running). RN has now moved this to the purple port on the CRRT machine so will have lab redraw a stat  level to confirm accuracy.  Goal of Therapy:  Heparin  level 0.3-0.7 units/ml Monitor platelets by anticoagulation protocol: Yes   Plan:  Continue heparin  at 1000 units/hr Will redraw STAT heparin  level  Vito Ralph, PharmD, BCPS Please see amion for complete clinical pharmacist phone list 06/02/2023 10:51 PM

## 2023-06-02 NOTE — Progress Notes (Addendum)
 eLink Physician-Brief Progress Note Patient Name: [name] DOB: [birth year] MRN: [id number]   Date of Service  06/02/2023  HPI/Events of Note  78 year old with CKD, diabetes, GERD, coronary artery disease disease with recent admission for acute kidney injury, elevated troponins in December presents to [facility] with similar presentation of failure to thrive, acute on chronic renal failure, hypertension, elevated troponins with EKG changes.   On CRRT with extrinsic bicarb infusion as well.  Last labs are from 2 PM, CRRT started around 5-6 PM.  eICU Interventions  Repeat labs now with a BMP and an ABG.   2317 -add Posey belt restraint  0504 - K 3, repeat abg. Kcl  0611 -KCl IV instead of p.o.     Rush Salce 06/02/2023, 9:03 PM

## 2023-06-02 NOTE — ED Triage Notes (Signed)
 PT tx via [facility] from [place] as Code Sepsis with hypotension and AMS. PT has had at leas 5300 of fluids, 5 mg Haldol  and 25 mg of Benadryl .  RN reports hx of dementia and CKD.  PT pulled out IV's an Foley prior to transport. PT arrived with 1 IV and a new Foley placed.

## 2023-06-03 ENCOUNTER — Ambulatory Visit: Payer: Self-pay | Admitting: *Deleted

## 2023-06-03 ENCOUNTER — Inpatient Hospital Stay (HOSPITAL_COMMUNITY): Payer: No Typology Code available for payment source

## 2023-06-03 DIAGNOSIS — A419 Sepsis, unspecified organism: Secondary | ICD-10-CM

## 2023-06-03 DIAGNOSIS — R0609 Other forms of dyspnea: Secondary | ICD-10-CM

## 2023-06-03 DIAGNOSIS — N1831 Chronic kidney disease, stage 3a: Secondary | ICD-10-CM | POA: Diagnosis not present

## 2023-06-03 DIAGNOSIS — Z794 Long term (current) use of insulin: Secondary | ICD-10-CM

## 2023-06-03 DIAGNOSIS — R6521 Severe sepsis with septic shock: Secondary | ICD-10-CM | POA: Diagnosis not present

## 2023-06-03 DIAGNOSIS — G9341 Metabolic encephalopathy: Secondary | ICD-10-CM | POA: Diagnosis not present

## 2023-06-03 DIAGNOSIS — E119 Type 2 diabetes mellitus without complications: Secondary | ICD-10-CM | POA: Diagnosis not present

## 2023-06-03 DIAGNOSIS — N179 Acute kidney failure, unspecified: Secondary | ICD-10-CM | POA: Diagnosis not present

## 2023-06-03 LAB — GLUCOSE, CAPILLARY
Glucose-Capillary: 189 mg/dL — ABNORMAL HIGH (ref 70–99)
Glucose-Capillary: 162 mg/dL — ABNORMAL HIGH (ref 70–99)
Glucose-Capillary: 121 mg/dL — ABNORMAL HIGH (ref 70–99)
Glucose-Capillary: 114 mg/dL — ABNORMAL HIGH (ref 70–99)
Glucose-Capillary: 138 mg/dL — ABNORMAL HIGH (ref 70–99)
Glucose-Capillary: 140 mg/dL — ABNORMAL HIGH (ref 70–99)

## 2023-06-03 LAB — CBC
HCT: 21.5 % — ABNORMAL LOW (ref 39.0–52.0)
HCT: 22.7 % — ABNORMAL LOW (ref 39.0–52.0)
HCT: 21.5 % — ABNORMAL LOW (ref 39.0–52.0)
Hemoglobin: 7.7 g/dL — ABNORMAL LOW (ref 13.0–17.0)
Hemoglobin: 8.1 g/dL — ABNORMAL LOW (ref 13.0–17.0)
Hemoglobin: 7.7 g/dL — ABNORMAL LOW (ref 13.0–17.0)
MCH: 32.9 pg (ref 26.0–34.0)
MCH: 32.3 pg (ref 26.0–34.0)
MCH: 32.9 pg (ref 26.0–34.0)
MCHC: 35.8 g/dL (ref 30.0–36.0)
MCHC: 35.7 g/dL (ref 30.0–36.0)
MCHC: 35.8 g/dL (ref 30.0–36.0)
MCV: 91.9 fL (ref 80.0–100.0)
MCV: 90.4 fL (ref 80.0–100.0)
MCV: 91.9 fL (ref 80.0–100.0)
Platelets: 142 10*3/uL — ABNORMAL LOW (ref 150–400)
Platelets: 158 10*3/uL (ref 150–400)
Platelets: 126 10*3/uL — ABNORMAL LOW (ref 150–400)
RBC: 2.34 MIL/uL — ABNORMAL LOW (ref 4.22–5.81)
RBC: 2.51 MIL/uL — ABNORMAL LOW (ref 4.22–5.81)
RBC: 2.34 MIL/uL — ABNORMAL LOW (ref 4.22–5.81)
RDW: 13.4 % (ref 11.5–15.5)
RDW: 13.5 % (ref 11.5–15.5)
RDW: 13.7 % (ref 11.5–15.5)
WBC: 13.1 10*3/uL — ABNORMAL HIGH (ref 4.0–10.5)
WBC: 15.3 10*3/uL — ABNORMAL HIGH (ref 4.0–10.5)
WBC: 12.7 10*3/uL — ABNORMAL HIGH (ref 4.0–10.5)
nRBC: 0 % (ref 0.0–0.2)
nRBC: 0 % (ref 0.0–0.2)
nRBC: 0 % (ref 0.0–0.2)

## 2023-06-03 LAB — POCT I-STAT 7, (LYTES, BLD GAS, ICA,H+H)
Acid-base deficit: 13 mmol/L — ABNORMAL HIGH (ref 0.0–2.0)
Acid-base deficit: 2 mmol/L (ref 0.0–2.0)
Bicarbonate: 12.7 mmol/L — ABNORMAL LOW (ref 20.0–28.0)
Bicarbonate: 21.5 mmol/L (ref 20.0–28.0)
Calcium, Ion: 1.06 mmol/L — ABNORMAL LOW (ref 1.15–1.40)
Calcium, Ion: 1.02 mmol/L — ABNORMAL LOW (ref 1.15–1.40)
HCT: 21 % — ABNORMAL LOW (ref 39.0–52.0)
HCT: 20 % — ABNORMAL LOW (ref 39.0–52.0)
Hemoglobin: 7.1 g/dL — ABNORMAL LOW (ref 13.0–17.0)
Hemoglobin: 6.8 g/dL — CL (ref 13.0–17.0)
O2 Saturation: 94 %
O2 Saturation: 51 %
Patient temperature: 97.9
Patient temperature: 98.6
Potassium: 3.6 mmol/L (ref 3.5–5.1)
Potassium: 3 mmol/L — ABNORMAL LOW (ref 3.5–5.1)
Sodium: 139 mmol/L (ref 135–145)
Sodium: 138 mmol/L (ref 135–145)
TCO2: 14 mmol/L — ABNORMAL LOW (ref 22–32)
TCO2: 22 mmol/L (ref 22–32)
pCO2 arterial: 27.6 mm[Hg] — ABNORMAL LOW (ref 32–48)
pCO2 arterial: 30.5 mm[Hg] — ABNORMAL LOW (ref 32–48)
pH, Arterial: 7.269 — ABNORMAL LOW (ref 7.35–7.45)
pH, Arterial: 7.457 — ABNORMAL HIGH (ref 7.35–7.45)
pO2, Arterial: 78 mm[Hg] — ABNORMAL LOW (ref 83–108)
pO2, Arterial: 25 mm[Hg] — CL (ref 83–108)

## 2023-06-03 LAB — RENAL FUNCTION PANEL
Albumin: 2.4 g/dL — ABNORMAL LOW (ref 3.5–5.0)
Albumin: 2.5 g/dL — ABNORMAL LOW (ref 3.5–5.0)
Anion gap: 20 — ABNORMAL HIGH (ref 5–15)
Anion gap: 15 (ref 5–15)
BUN: 55 mg/dL — ABNORMAL HIGH (ref 8–23)
BUN: 29 mg/dL — ABNORMAL HIGH (ref 8–23)
CO2: 19 mmol/L — ABNORMAL LOW (ref 22–32)
CO2: 23 mmol/L (ref 22–32)
Calcium: 7.5 mg/dL — ABNORMAL LOW (ref 8.9–10.3)
Calcium: 7.6 mg/dL — ABNORMAL LOW (ref 8.9–10.3)
Chloride: 99 mmol/L (ref 98–111)
Chloride: 101 mmol/L (ref 98–111)
Creatinine, Ser: 3.96 mg/dL — ABNORMAL HIGH (ref 0.61–1.24)
Creatinine, Ser: 2.37 mg/dL — ABNORMAL HIGH (ref 0.61–1.24)
GFR, Estimated: 15 mL/min — ABNORMAL LOW (ref >60)
GFR, Estimated: 28 mL/min — ABNORMAL LOW (ref >60)
Glucose, Bld: 183 mg/dL — ABNORMAL HIGH (ref 70–99)
Glucose, Bld: 133 mg/dL — ABNORMAL HIGH (ref 70–99)
Phosphorus: 4.5 mg/dL (ref 2.5–4.6)
Phosphorus: 2.8 mg/dL (ref 2.5–4.6)
Potassium: 3 mmol/L — ABNORMAL LOW (ref 3.5–5.1)
Potassium: 3.8 mmol/L (ref 3.5–5.1)
Sodium: 138 mmol/L (ref 135–145)
Sodium: 139 mmol/L (ref 135–145)

## 2023-06-03 LAB — ECHOCARDIOGRAM LIMITED
AR max vel: 2.05 cm2
AV Peak grad: 4.8 mm[Hg]
Ao pk vel: 1.1 m/s
Area-P 1/2: 5.02 cm2
Height: 74 in
S' Lateral: 3.4 cm
Weight: 2740.76 [oz_av]

## 2023-06-03 LAB — URINALYSIS, W/ REFLEX TO CULTURE (INFECTION SUSPECTED)
Bacteria, UA: NONE SEEN
Bilirubin Urine: NEGATIVE
Glucose, UA: 50 mg/dL — AB
Ketones, ur: NEGATIVE mg/dL
Leukocytes,Ua: NEGATIVE
Nitrite: NEGATIVE
Protein, ur: 100 mg/dL — AB
RBC / HPF: >50 RBC/hpf (ref 0–5)
Specific Gravity, Urine: 1.016 (ref 1.005–1.030)
pH: 5 (ref 5.0–8.0)

## 2023-06-03 LAB — HEPARIN LEVEL (UNFRACTIONATED): Heparin Unfractionated: 0.61 [IU]/mL (ref 0.30–0.70)

## 2023-06-03 LAB — TROPONIN I (HIGH SENSITIVITY): Troponin I (High Sensitivity): >24000 ng/L (ref <18)

## 2023-06-03 LAB — MAGNESIUM: Magnesium: 1.9 mg/dL (ref 1.7–2.4)

## 2023-06-03 LAB — BRAIN NATRIURETIC PEPTIDE: B Natriuretic Peptide: 593.5 pg/mL — ABNORMAL HIGH (ref 0.0–100.0)

## 2023-06-03 LAB — PROCALCITONIN: Procalcitonin: 0.13 ng/mL

## 2023-06-03 LAB — CREATININE, URINE, RANDOM: Creatinine, Urine: 100 mg/dL

## 2023-06-03 LAB — SODIUM, URINE, RANDOM: Sodium, Ur: 48 mmol/L

## 2023-06-03 MED ORDER — ACETAMINOPHEN 325 MG PO TABS: 650.0000 mg | ORAL_TABLET | Freq: Four times a day (QID) | ORAL | Status: DC | PRN

## 2023-06-03 MED ORDER — FENTANYL CITRATE PF 50 MCG/ML IJ SOSY
12.5000 ug | PREFILLED_SYRINGE | INTRAMUSCULAR | Status: DC | PRN
  Administered 2023-06-03 (×2): 25 ug via INTRAVENOUS
  Administered 2023-06-03: 12.5 ug via INTRAVENOUS
  Administered 2023-06-04 (×2): 25 ug via INTRAVENOUS
  Filled 2023-06-03 (×5): qty 1

## 2023-06-03 MED ORDER — POTASSIUM CHLORIDE 10 MEQ/100ML IV SOLN
10.0000 meq | INTRAVENOUS | Status: AC
Start: 2023-06-03 — End: 2023-06-03
  Administered 2023-06-03 (×4): 10 meq via INTRAVENOUS
  Filled 2023-06-03 (×4): qty 100

## 2023-06-03 MED ORDER — FENTANYL CITRATE PF 50 MCG/ML IJ SOSY
PREFILLED_SYRINGE | INTRAMUSCULAR | Status: AC
  Administered 2023-06-03: 25 ug
  Filled 2023-06-03: qty 1

## 2023-06-03 MED ORDER — POTASSIUM CHLORIDE 20 MEQ PO PACK: 40.0000 meq | PACK | Freq: Once | ORAL | Status: DC

## 2023-06-03 MED ORDER — PIPERACILLIN-TAZOBACTAM 3.375 G IVPB 30 MIN
3.3750 g | Freq: Four times a day (QID) | INTRAVENOUS | Status: DC
  Administered 2023-06-03 – 2023-06-04 (×2): 3.375 g via INTRAVENOUS
  Filled 2023-06-03 (×2): qty 50

## 2023-06-03 MED ORDER — OXIDIZED CELLULOSE EX PADS
1.0000 | MEDICATED_PAD | Freq: Once | CUTANEOUS | Status: AC
  Administered 2023-06-03: 1 via TOPICAL
  Filled 2023-06-03: qty 1

## 2023-06-03 MED ORDER — LORAZEPAM 2 MG/ML IJ SOLN
1.0000 mg | INTRAMUSCULAR | Status: DC | PRN
  Administered 2023-06-03: 1 mg via INTRAVENOUS
  Administered 2023-06-04 (×2): 2 mg via INTRAVENOUS
  Filled 2023-06-03 (×3): qty 1

## 2023-06-03 MED ORDER — HEPARIN (PORCINE) 25000 UT/250ML-% IV SOLN
750.0000 [IU]/h | INTRAVENOUS | Status: DC
  Administered 2023-06-03: 750 [IU]/h via INTRAVENOUS
  Filled 2023-06-03: qty 250

## 2023-06-03 MED ORDER — PRISMASOL BGK 4/2.5 32-4-2.5 MEQ/L EC SOLN
Status: DC
Start: 2023-06-03 — End: 2023-06-04

## 2023-06-03 MED ORDER — FENTANYL CITRATE PF 50 MCG/ML IJ SOSY: 25.0000 ug | PREFILLED_SYRINGE | INTRAMUSCULAR | Status: DC | PRN

## 2023-06-03 MED ORDER — ROSUVASTATIN CALCIUM 5 MG PO TABS: 10.0000 mg | ORAL_TABLET | Freq: Every day | ORAL | Status: DC

## 2023-06-03 MED ORDER — HALOPERIDOL LACTATE 5 MG/ML IJ SOLN
1.0000 mg | Freq: Four times a day (QID) | INTRAMUSCULAR | Status: DC | PRN
  Administered 2023-06-03: 1 mg via INTRAVENOUS
  Filled 2023-06-03: qty 1

## 2023-06-03 MED ORDER — HALOPERIDOL LACTATE 5 MG/ML IJ SOLN
2.0000 mg | Freq: Four times a day (QID) | INTRAMUSCULAR | Status: DC | PRN
  Administered 2023-06-03: 2 mg via INTRAVENOUS
  Filled 2023-06-03 (×2): qty 1

## 2023-06-03 MED ORDER — PRISMASOL BGK 4/2.5 32-4-2.5 MEQ/L EC SOLN: Status: DC

## 2023-06-03 MED ORDER — OXIDIZED CELLULOSE EX PADS: 1.0000 | MEDICATED_PAD | Freq: Two times a day (BID) | CUTANEOUS | Status: DC | PRN

## 2023-06-03 NOTE — Consult Note (Addendum)
 Advanced Heart Failure Team Consult Note   Primary Physician: Cook, Jayce G, DO Cardiologist:  Peter Sw[place], MD  Reason for Consultation: Elevated Troponins  HPI:    [name] is seen today for evaluation of elevated troponins at the request of Dr. Fulton Job.   [name] is a 78 year old with past medical history of HFpEF, CKD 4, diabetes, HTN, dementia, prostate cancer, Crohn's, GERD, CAD, AKI, and elevated troponins in December  Recent admission in December for nausea and vomiting in the setting of failure to thrive. He was hypovolemic and hypoglycemia to the 30s with decreased appetite and in metabolic acidosis with elevated troponins 280>199 and anemia.  He was transferred from [facility] for sepsis with AMS, hypotension, and hypothermia (95 F). In the ED he was given 6L normal saline and started on broad spectrum abx with vanc and zosyn  for presumed septic shock. During this time patient was severely agitated (pulling out multiple Ivs and his foley catheter), he was given haldol  and benadryl . Vitals were notable for BP 90/50s, HR in 80s, and Temp 95.5. Labs notable for K 5.6, Cl 115, CO <7, BUN/Cr 88/5.76, Ca 7.7, anion gap 16, hs-trop 3466, BNP 450, LA 0.7, procal 0.24. VBG 6.9/31/77/8/24/7/85. EKG showed AF 81 bpm with PVC and minor ST elevation in Lead III and aVR, and ST depression in I, aVL, and V2 (unchanged from EKG 03/29/23). Cardiology consulted d/t concern for possible STEMI.   Given significant AKI, nephrology was consulted and acutely started on CRRT given severe metabolic acidosis.   Echo pending. AHF to see today.  Home Medications Prior to Admission medications   Medication Sig Start Date End Date Taking? Authorizing Provider  albuterol  (VENTOLIN  HFA) 108 (90 Base) MCG/ACT inhaler Inhale 2 puffs into the lungs every 4 (four) hours as needed for wheezing or shortness of breath (cough). 01/11/22   Colin Dawley, MD  aspirin  EC 81 MG tablet Take 1  tablet (81 mg total) by mouth daily with breakfast. 01/11/22   Emokpae, Courage, MD  Cholecalciferol  25 MCG (1000 UT) CHEW Chew 25 Units by mouth daily.    [provider]  donepezil  (ARICEPT ) 5 MG tablet Take 1 tablet (5 mg total) by mouth at bedtime. 12/08/21   Cook, Jayce G, DO  glimepiride  (AMARYL ) 2 MG tablet Take 1 tablet (2 mg total) by mouth 2 (two) times daily with a meal. Patient taking differently: Take 5 mg by mouth daily. 01/11/22 01/11/23  Colin Dawley, MD  insulin  glargine (LANTUS  SOLOSTAR) 100 UNIT/ML Solostar Pen Inject 20 Units into the skin at bedtime. Patient taking differently: Inject 14 Units into the skin at bedtime. 02/26/22   Cook, Jayce G, DO  Insulin  Pen Needle 32G X 4 MM MISC 1 Needle by Does not apply route at bedtime. For insulin  administration 01/11/22   Colin Dawley, MD  losartan  (COZAAR ) 25 MG tablet Take 1 tablet (25 mg total) by mouth daily. 03/30/23 03/29/24  Bobbetta Burnet, MD  mesalamine  (LIALDA ) 1.2 g EC tablet Take 1.2 g by mouth 2 (two) times daily.    [provider]  mirtazapine  (REMERON ) 7.5 MG tablet Take 1 tablet (7.5 mg total) by mouth at bedtime. 01/11/22   Colin Dawley, MD  omeprazole  (PRILOSEC) 20 MG capsule Take 1 capsule (20 mg total) by mouth daily. 01/11/22   Colin Dawley, MD  propranolol  (INDERAL ) 40 MG tablet Take 1 tablet (40 mg total) by mouth 2 (two) times daily. 01/11/22   Colin Dawley, MD  risperiDONE  (RISPERDAL ) 1 MG tablet Take 0.5 tablets (0.5 mg total) by mouth 2 (two) times daily. 09/27/22   Cook, Jayce G, DO  rosuvastatin  (CRESTOR ) 20 MG tablet Take 1 tablet (20 mg total) by mouth daily. 02/28/23   Cook, Jayce G, DO  sertraline  (ZOLOFT ) 100 MG tablet Take 1.5 tablets (150 mg total) by mouth daily. 12/04/22   Cook, Jayce G, DO   Past Medical History: Past Medical History:  Diagnosis Date   Adenocarcinoma of prostate [facility])    Anxiety    CAD (coronary artery disease)    a. s/p prior stenting of LAD in  1996 and angioplasty alone in 1999 to LAD by review of prior notes. b. 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    Crohn's disease (HCC)    CTS (carpal tunnel syndrome)    Mild   Depression    Diabetes mellitus without complication (HCC)    Type 2   Hyperlipidemia    Hypertension    Kidney stone    Optic neuritis    Past Surgical History: Past Surgical History:  Procedure Laterality Date   COLONOSCOPY     CORONARY ARTERY BYPASS GRAFT N/A 04/01/2018   Procedure: CORONARY ARTERY BYPASS GRAFTING (CABG) TIMES FOUR: (LIMA to LAD, CRYO VEIN to OM1, SVG to OM2, and SVG to PDA) with PARTIAL EVH/OPEN from RIGHT and LEFT GREATER SAPHENOUS VEIN and LEFT INTERNAL MAMMARY ARTERY;  Surgeon: Heriberto London, MD;  Location: [facility] OR;  Service: Open Heart Surgery;  Laterality: N/A;   KNEE CARTILAGE SURGERY Right    LEFT HEART CATH AND CORONARY ANGIOGRAPHY N/A 03/27/2018   Procedure: LEFT HEART CATH AND CORONARY ANGIOGRAPHY;  Surgeon: Millicent Ally, MD;  Location: [facility] INVASIVE CV LAB;  Service: Cardiovascular;  Laterality: N/A;   LOWER EXTREMITY ANGIOGRAPHY N/A 08/13/2016   Procedure: Lower Extremity Angiography;  Surgeon: Avanell Leigh, MD;  Location: [facility] INVASIVE CV LAB;  Service: Cardiovascular;  Laterality: N/A;   NASAL SINUS SURGERY     PROSTATE SURGERY     TEE WITHOUT CARDIOVERSION N/A 04/01/2018   Procedure: TRANSESOPHAGEAL ECHOCARDIOGRAM (TEE);  Surgeon: Matt Song, Donata Fryer, MD;  Location: [facility] OR;  Service: Open Heart Surgery;  Laterality: N/A;   Family History: Family History  Problem Relation Age of Onset   Hypertension Mother    Colon cancer Mother    Heart attack Father    Coronary artery disease Father    Hypertension Sister    Crohn's disease Brother    Social History: Social History   Socioeconomic History   Marital status: Married    Spouse name: Rusbel Iwen   Number of children: Not on file   Years of education: 1    Highest education level: Not on file  Occupational History   Occupation: Curator  Tobacco Use   Smoking status: Former    Current packs/day: 0.00    Types: Cigarettes    Quit date: 02/19/2018    Years since quitting: 5.2   Smokeless tobacco: Never  Vaping Use   Vaping status: Never Used  Substance and Sexual Activity   Alcohol  use: No   Drug use: No   Sexual activity: Not on file  Other Topics Concern   Not on file  Social History Narrative   Not on file   Social Drivers of Health   Financial Resource Strain: Low Risk  (01/24/2022)   Overall Financial Resource Strain (CARDIA)  Difficulty of Paying Living Expenses: Not hard at all  Food Insecurity: Patient Unable To Answer (06/02/2023)   Hunger Vital Sign    Worried About Running Out of Food in the Last Year: Patient unable to answer    Ran Out of Food in the Last Year: Patient unable to answer  Transportation Needs: Patient Unable To Answer (06/02/2023)   PRAPARE - Transportation    Lack of Transportation (Medical): Patient unable to answer    Lack of Transportation (Non-Medical): Patient unable to answer  Physical Activity: Not on file  Stress: No Stress Concern Present (01/24/2022)   [employer] of Occupational Health - Occupational Stress Questionnaire    Feeling of Stress : Only a little  Social Connections: Patient Unable To Answer (06/02/2023)   Social Connection and Isolation Panel [NHANES]    Frequency of Communication with Friends and Family: Patient unable to answer    Frequency of Social Gatherings with Friends and Family: Patient unable to answer    Attends Religious Services: Patient unable to answer    Active Member of Clubs or Organizations: Patient unable to answer    Attends Banker Meetings: Patient unable to answer    Marital Status: Patient unable to answer   Allergies:  Allergies  Allergen Reactions   Buprenorphine Other (See Comments)   Buprenorphine Hcl Other (See Comments)    Flomax [Tamsulosin Hcl]     UNSPECIFIED REACTION to HIGH DOSE    Morphine And Codeine     UNSPECIFIED REACTION    Objective:    Vital Signs:   Temp:  [95.5 F (35.3 C)-98.7 F (37.1 C)] 98.7 F (37.1 C) (02/10 0800) Pulse Rate:  [76-96] 86 (02/10 1000) Resp:  [18-34] 29 (02/10 1000) BP: (83-180)/(49-89) 180/68 (02/10 1000) SpO2:  [92 %-100 %] 96 % (02/10 1000) Weight:  [73.7 kg-77.7 kg] 77.7 kg (02/10 0425) Last BM Date :  (PTA)  Weight change: Filed Weights   06/02/23 1400 06/02/23 1831 06/03/23 0425  Weight: 73.7 kg 73.7 kg 77.7 kg   Intake/Output:   Intake/Output Summary (Last 24 hours) at 06/03/2023 1036 Last data filed at 06/03/2023 1000 Gross per 24 hour  Intake 3153.44 ml  Output 1403 ml  Net 1750.44 ml    Physical Exam    General: Elderly, frail appearing. No distress on [place] Cardiac: JVP not visualized. S1 and S2 present. No murmurs or rub. Abdomen: Soft, non-tender, non-distended. Extremities: Warm and dry. No cyanosis. 1-2+ BLE edema.  Neuro: Confused Lines/Devices: RIJ HD, Foley  Telemetry   NSR 80-90s (personally reviewed)  EKG    EKG reviewed as above  Labs   Basic Metabolic Panel: Recent Labs  Lab 06/02/23 1051 06/02/23 1116 06/02/23 1431 06/02/23 2125 06/02/23 2220 06/03/23 0357 06/03/23 0556  NA 137  137   < > 138 139 139 138 138  K 5.4*  5.6*   < > 5.5* 4.0 3.6 3.0* 3.0*  CL 114*  115*  --   --  107  --  99  --   CO2 7*  <7*  --   --  12*  --  19*  --   GLUCOSE 142*  141*  --   --  229*  --  183*  --   BUN 90*  88*  --   --  81*  --  55*  --   CREATININE 5.76*  5.70*  --   --  5.11*  --  3.96*  --   CALCIUM  7.7*  7.8*  --   --  7.3*  --  7.5*  --   MG 2.1  --   --   --   --  1.9  --   PHOS  --   --   --   --   --  4.5  --    < > = values in this interval not displayed.   Liver Function Tests: Recent Labs  Lab 06/02/23 1051 06/03/23 0357  AST 39  --   ALT 25  --   ALKPHOS 79  --   BILITOT 1.1  --   PROT 6.0*  --    ALBUMIN  2.8* 2.4*   No results for input(s): "LIPASE", "AMYLASE" in the last 168 hours. No results for input(s): "AMMONIA" in the last 168 hours.  CBC: Recent Labs  Lab 06/02/23 1051 06/02/23 1116 06/02/23 1431 06/02/23 1632 06/02/23 2220 06/03/23 0357 06/03/23 0556  WBC 13.6*  13.5*  --   --  16.9*  --  13.1*  --   NEUTROABS 12.4*  --   --   --   --   --   --   HGB 8.5*  8.7*   < > 8.2* 8.2* 7.1* 7.7* 6.8*  HCT 27.6*  28.1*   < > 24.0* 25.9* 21.0* 21.5* 20.0*  MCV 102.2*  102.6*  --   --  100.4*  --  91.9  --   PLT 148*  146*  --   --  166  --  142*  --    < > = values in this interval not displayed.   Cardiac Enzymes: No results for input(s): "CKTOTAL", "CKMB", "CKMBINDEX", "TROPONINI" in the last 168 hours.  BNP: BNP (last 3 results) Recent Labs    01/21/23 1512 06/02/23 1051 06/03/23 0357  BNP 94.1 456.4* 593.5*   ProBNP (last 3 results) No results for input(s): "PROBNP" in the last 8760 hours.  CBG: Recent Labs  Lab 06/02/23 1654 06/02/23 2017 06/02/23 2311 06/03/23 0321 06/03/23 0738  GLUCAP 163* 201* 201* 189* 162*   Coagulation Studies: No results for input(s): "LABPROT", "INR" in the last 72 hours.  Imaging   US  RENAL Result Date: 06/02/2023 CLINICAL DATA:  Acute renal failure superimposed on stage IV chronic renal disease. EXAM: RENAL / URINARY TRACT ULTRASOUND COMPLETE COMPARISON:  None Available. FINDINGS: Right Kidney: Renal measurements: 9.3 cm x 4.8 cm x 6.1 cm = volume: 141.60 mL. Diffusely increased echogenicity of the renal parenchyma is noted. A 1.7 cm x 1.0 cm x 1.3 cm simple right renal cyst is seen. No hydronephrosis is visualized. Left Kidney: Renal measurements: 10.3 cm x 4.8 cm x 4.5 cm = volume: 116.7 mL. Diffusely increased echogenicity of the renal parenchyma is noted. No mass or hydronephrosis visualized. Bladder: A Foley catheter is in place. Other: None. IMPRESSION: 1. Bilateral echogenic kidneys which may represent sequelae  associated with medical renal disease. 2. Simple right renal cyst. Electronically Signed   By: Virgle Grime M.D.   On: 06/02/2023 20:50   DG Chest Port 1 View Result Date: 06/02/2023 CLINICAL DATA:  Central line placement EXAM: PORTABLE CHEST 1 VIEW COMPARISON:  06/02/2023, 1:48 a.m. FINDINGS: Interval placement of large-bore right neck multi lumen vascular catheter, tip near the superior cavoatrial junction. 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 Mild diffuse interstitial opacity. No acute osseous findings. IMPRESSION: 1. Interval placement of large-bore right neck multi lumen vascular catheter, tip near the superior cavoatrial junction. No pneumothorax. 2. Cardiomegaly with mild  diffuse interstitial opacity, consistent with edema. Electronically Signed   By: Fredricka Jenny M.D.   On: 06/02/2023 16:48   Medications:    Current Medications:  aspirin  EC  81 mg Oral Q breakfast   Chlorhexidine  Gluconate Cloth  6 each Topical Daily   donepezil   5 mg Oral QHS   insulin  aspart  0-9 Units Subcutaneous Q4H   pantoprazole  (PROTONIX ) IV  40 mg Intravenous Q12H   rosuvastatin   20 mg Oral Daily   sertraline   150 mg Oral Daily   sodium zirconium cyclosilicate   5 g Oral Once   vancomycin  variable dose per unstable renal function (pharmacist dosing)   Does not apply See admin instructions    Infusions:  ceFEPime  (MAXIPIME ) IV Stopped (06/02/23 2349)   heparin  750 Units/hr (06/03/23 1000)   potassium chloride  100 mL/hr at 06/03/23 1000   prismasol  BGK 4/2.5 1,500 mL/hr at 06/03/23 1018   prismasol  BGK 4/2.5 400 mL/hr at 06/03/23 0854   prismasol  BGK 4/2.5 400 mL/hr at 06/03/23 4782   vancomycin       Patient Profile   [name] is a 78 year old with CKD, diabetes, GERD, CAD, AKI, and elevated troponin.   presents to [facility] with similar presentation of failure to thrive, acute on chronic renal failure, hypertension, elevated troponins with EKG changes.    Assessment/Plan   Elevated Troponin CAD s/p CABG in 2019 - Trop 3466 on admission in the setting of shock - EKG on admission showed AF 81 bpm with PVC and minor ST elevation in Lead III and aVR, and ST depression in I, aVL, and V2 (unchanged from EKG 03/29/23) - No CP - ASA + Crestor  10 mg daily - on heparin  gtt  HFpEF - Echo (12/24) with EF 55-60 with G2DD  - repeat echo pending - would avoid SLGT2i with concern for FTT - GDMT limited by hypotension and new renal failure  Acute renal failure Metabolic acidosis - previously CKD4 - on CRRT. Nephrology following.  HTN - hypotensive on admission - resolved  Anemia - hgb 8 on admission - hgb 7.7 today - no obvious sources of bleeding - stool occult  Failure to Thrive - encephalopathic in the setting of dementia - consider Palliative care consult for GOC discussion  Length of Stay: 1  [place] Lee, NP  06/03/2023, 10:36 AM  Advanced Heart Failure Team Pager [phone] (M-F; 7a - 5p)  Please contact [facility] for night-coverage after hours (4p -7a ) and weekends on [email or web address]  Patient seen with NP, agree with the above note.   He was admitted with hypotension due to septic versus hypovolemic shock with altered mental status as well as AKI. He received IVF and was started on vancomycin /cefepime . He was ultimated started on CVVH.  He was noted to have HS-TnI 3427 => 3466.  ECG showed slight ST changes that were not significantly different from prior ECG.   This morning, he is awake but confused and unable to participate in interview.  He is in NSR.  Some ECGs are listed as atrial flutter, but suspect NSR with long 1st degree AVB.  He is off norepinephrine .  I reviewed today's echo, EF 50% with mid septal akinesis and apical inferior akinesis, mild RV dysfunction, IVC dilated.   General: agitated Neck: JVP 8-9 cm, no thyromegaly or thyroid  nodule.  Lungs: Clear to auscultation bilaterally with normal respiratory  effort. CV: Nondisplaced PMI.  Heart regular S1/S2, no S3/S4, no murmur.  No peripheral edema.  No carotid bruit.  Difficult to palpate pedal pulses.  Abdomen: Soft, nontender, no hepatosplenomegaly, no distention.  Skin: Intact without lesions or rashes.  Neurologic: Confused but awake.  Extremities: No clubbing or cyanosis.  HEENT: Normal.   1. CAD: H/o CABG in 2019.  This admission, HS-TnI 3427 => 3466.  ECG showed slight ST changes that were not significantly different from prior ECG. Echo today showed EF 50% with mid septal akinesis and apical inferior akinesis, mild RV dysfunction, IVC dilated.  No chest pain complaints.  He has AKI on CKD stage IV.  It is possible that this was demand ischemia in the setting of shock/hypotension and AKI with poor troponin clearance.   - I would not cath him at this point with AKI on CKD stage IV, no chest pain, and relatively preserved EF.  - Heparin  gtt x 48 hrs - ASA 81 - statin - Could consider use of Plavix  going forwards as well, will decide on this based on his trajectory.  2. Acute on chronic primarily diastolic CHF: Echo today showed EF 50% with mid septal akinesis and apical inferior akinesis, mild RV dysfunction, IVC dilated. Thought initially to have hypovolemic shock received a lot of IV fluid initially.  Now IVC dilated on echo and some volume overload on exam.  - Gentle UF via CVVH, 50 cc/hr net negative.  - GDMT limited by AKI, hypotension.  3. AKI on CKD stage IV: Now getting CVVH.  Suspect ATN due to hypotension/shock.  4. Neuro: Dementia with superimposed delirium.  5. Failure to thrive.   CRITICAL CARE Performed by: Peder Bourdon  Total critical care time: 60 minutes  Critical care time was exclusive of separately billable procedures and treating other patients.  Critical care was necessary to treat or prevent imminent or life-threatening deterioration.  Critical care was time spent personally by me on the following activities:  development of treatment plan with patient and/or surrogate as well as nursing, discussions with consultants, evaluation of patient's response to treatment, examination of patient, obtaining history from patient or surrogate, ordering and performing treatments and interventions, ordering and review of laboratory studies, ordering and review of radiographic studies, pulse oximetry and re-evaluation of patient's condition.  Peder Bourdon 06/03/2023 12:22 PM

## 2023-06-03 NOTE — Plan of Care (Signed)
  Problem: Safety: Goal: Non-violent Restraint(s) Outcome: Progressing   Problem: Clinical Measurements: Goal: Diagnostic test results will improve Outcome: Progressing   Problem: Coping: Goal: Level of anxiety will decrease Outcome: Not Progressing

## 2023-06-03 NOTE — IPAL (Addendum)
  Interdisciplinary Goals of Care Family Meeting   Date carried out:: 06/03/2023  Location of the meeting: Bedside  Member's involved: Physician, Bedside Registered Nurse, and Family Member or next of kin  Durable Power of Attorney or acting medical decision maker: wife    Discussion: We discussed goals of care for [name] .  We discussed [name]'s ongoing care and baseline health. He was diagnosed with early dementia a few years ago but has refused to leave his home for several years, has frequent hallucinations (children playing), and has been refusing to see a physician for his complaints. His wife has been trying to get him to go to the doctor but has been also stuck at home due to hip surgery. She does not think he would want aggressive care measures-- no intubation or MV, no resuscitation if he arrested. She does not think he would want long-term HD and is not sure he would want what he is getting now, but she does not want to stop it yet. We agreed that we will see how he does over the next 48 hrs and reassess on 06-10-2023. She wants him to not suffer and is glad that we treated his back pain. She will speak to his son, who has been minimally present to see how dramatic his decline has been over the past few years.   Code status: Full DNR  Disposition: Continue current acute care, no escalation   Time spent for the meeting: 20 min.  [name] 06/03/2023, 12:49 PM     Later this afternoon I met with [name] son and daughter-in-law at bedside.  They have been concerned about dementia but were unable to get clear answers previously.  We discussed the same his concerns that I have about his ability to recover and if the life-prolonging therapy is appropriate for him or if we are increasing agitation when he has somewhat limited life expectancy due to advanced dementia.  All questions were answered.  They confirmed that there is no advanced directive.  Paul Mussel, DO 06/03/23 6:03 PM [facility]  For contact information, see Amion. If no response to pager, please call PCCM consult pager. After hours, 7PM- 7AM, please call Elink.

## 2023-06-03 NOTE — Inpatient Diabetes Management (Signed)
 Inpatient Diabetes Program Recommendations  AACE/ADA: New Consensus Statement on Inpatient Glycemic Control (2015)  Target Ranges:  Prepandial:   less than 140 mg/dL      Peak postprandial:   less than 180 mg/dL (1-2 hours)      Critically ill patients:  140 - 180 mg/dL   Lab Results  Component Value Date   GLUCAP 121 (H) 06/03/2023   HGBA1C 5.4 03/29/2023    Review of Glycemic Control  Latest Reference Range & Units 06/03/23 03:21 06/03/23 07:38 06/03/23 11:13  Glucose-Capillary 70 - 99 mg/dL 829 (H) 562 (H) 130 (H)   Diabetes history: DM 2 Outpatient Diabetes medications:  Amaryl  4 mg daily Lantus  14 units daily Current orders for Inpatient glycemic control:  Novolog  0-9 units q 4 hours  Inpatient Diabetes Program Recommendations:    Agree with current orders.  Will follow.   Thanks,  Josefa Ni, RN, BC-ADM Inpatient Diabetes Coordinator Pager [phone]  (8a-5p)

## 2023-06-03 NOTE — Progress Notes (Signed)
 Echocardiogram 2D Echocardiogram has been performed.  [name] 06/03/2023, 9:52 AM

## 2023-06-03 NOTE — Progress Notes (Signed)
 NAME:  [name], MRN:  [id number], DOB:  [birth year], LOS: 1 ADMISSION DATE:  06/02/2023, CONSULTATION DATE: 06/02/2023 REFERRING MD: Cristine Done, CHIEF COMPLAINT: Acute-on-chronic renal failure hypotension, abnormal EKG and elevated troponin  History of Present Illness:  This is a 78 year old male patient with several comorbidities recently hospitalized back in December 2024 for acute on chronic renal failure resides at home, presented to [facility] emergency room with report of decreased oral intake, nausea and vomiting, worsening weakness, and some resting shortness of breath, some lethargy, and mild encephalopathy.  On initial EMS arrival he was hypotensive with systolic blood pressure in the 70s to 80s on arrival to the emergency room diagnostic evaluation included CT head which was negative for acute process, CT chest abdomen and pelvis was obtained there was no acute abnormality identified he did have a 4 mm right upper lobe pulmonary nodule COVID was negative influenza negative urinalysis with marked protein urea UDS negative sodium 143 anion gap greater than 15 creatinine 6.10, on record from his discharge in December creatinine appears to sit in the range from 2.7-2.8, HCO3 13.4 White blood cell count 11.7 Troponin I Initially 33, 6 hours spiked to 295 EKG showing concern for age unspecific anterior infarct and ST abnormality in the lateral leads He was started on IV hydration Placed on telemetry Cultures sent Empiric antibiotics administered Transferred to [facility] for definitive care critical care asked to admit  Pertinent Medical History:  CKD stage III a, poorly controlled diabetes with neuropathy, essential hypertension, dyslipidemia, GERD with esophagitis, recent elevated troponin during admission in December 2024, coronary artery disease with prior PCI and stents, Crohn's disease, anxiety, depression  Significant Hospital Events: Including procedures,  antibiotic start and stop dates in addition to other pertinent events   2/9 Transferred ER to ER from [facility] to [place] with chief complaint of weakness, vomiting, poor p.o. intake found to have hypotension acute on chronic renal failure anion gap metabolic acidosis borderline hyperkalemia and EKG changes worrisome for possible cardiac ischemia transferred to [facility] for evaluation 2/10 Progressively more agitated, on CRRT. DNR/DNI per patient's wife.  Interim History / Subjective:  Agitated overnight with delirium superimposed on dementia Remains on CRRT Trialysis catheter at risk due to significant agitation Fentanyl  given for pain control with improvement Discussion with patient's wife [name]) and friend at bedside She states that patient "would not want this"; DNR/DNI in place Time-limited trial of CRRT/ongoing ICU care, plan for repeat discussion 2023-07-01  Objective:  Blood pressure (!) 104/57, pulse 83, temperature 98.7 F (37.1 C), temperature source Oral, resp. rate (!) 24, height 6\' 2"  (1.88 m), weight 77.7 kg, SpO2 93%.        Intake/Output Summary (Last 24 hours) at 06/03/2023 1114 Last data filed at 06/03/2023 1100 Gross per 24 hour  Intake 3253.12 ml  Output 1505 ml  Net 1748.12 ml   Filed Weights   06/02/23 1400 06/02/23 1831 06/03/23 0425  Weight: 73.7 kg 73.7 kg 77.7 kg   Physical Examination: General: Acute-on-chronically ill-appearing elderly man in NAD. Appears uncomfortable. Agitated, restless. HEENT: [place]/AT, anicteric sclera, PERRL, dry mucous membranes. Neck: R internal jugular Trialysis catheter with dressing saturated with blood, oozing at insertion site. Neuro:  Awake, agitated/restless, oriented to self.  Responds to verbal stimuli. Not following commands. Moves all 4 extremities spontaneously. Strength 4-5/5 in all 4 extremities.  CV: RRR, no m/g/r. PULM: Breathing even and unlabored on 2LNC. Lung fields CTAB. GI: Soft, nontender, nondistended. Normoactive  bowel sounds.  Extremities: No significant LE edema noted. Skin: Warm/dry, no rashes.  Resolved Hospital Problem List:    Assessment & Plan:  Acute on chronic renal failure, baseline CKD stage IIIa Severe metabolic acidosis, positive anion gap Poor p.o. intake certainly a contributing factor with associated hypotension and dehydration.  Did report some dysuria, although his ability to provide history is limited.  CT abdomen pelvis negative for hydro - Nephro following, appreciate assistance - CRRT at present; patient's wife states he would not want long-term support of any kind - Plan to revisit clinical status and GOC 06/17/23 - Trend BMP - Replete electrolytes as indicated - Monitor I&Os, Foley in place - Avoid nephrotoxic agents as able - Ensure adequate renal perfusion  Hypotension, seems to be resolving with volume so hypovolemic shock most likely contributor, although cannot rule out sepsis. LA WNL. - Goal MAP > 65 - Trend WBC, fever curve - F/u Cx data - Continue broad-spectrum antibiotics (cefepime ), vanc discontinued  Non-ST elevation MI with elevated troponin known history of CAD with prior PCI and stents Most likely demand ischemia however has had marked troponin Escalation here in the emergency room initially less than 20 spiked at 6 hours to 295 - Cardiology consulted - Heparin  gtt per protocol for ACS - ASA/statin - Cardiac monitoring - Echo EF 45-5-%. +RWMAs, G1DD  Acute metabolic encephalopathy.  Looks like he may have some underlying dementia as home medications include Aricept , however clearly being exacerbated by uremia and acidosis. - Feel this is likely multifactorial in the setting of uremia/acidosis, ICU delirium on a background of baseline dementia - Resume home medications as indicated, awaiting enteral access - Correct metabolic derangements - Limit interruptions to sleep/wake cycles as able - Supportive care  Type 2 diabetes, insulin -dependent with  neuropathy - SSI - CBGs Q4H - Goal CBG 140-180 - Hold home Amaryl   History of Crohn's disease and GERD - Trend H&H, f/u FOBT - Monitor for signs of active bleeding - Transfuse for Hgb < 7.0 or hemodynamically significant bleeding - Continue PPI  GOC Discussion with patient's wife, [name], and friend at bedside 2/10. Patient's wife states that Deatra Face is a very independent, proud man who would never have wanted long term support with any kind of machines.  - DNR/DNI per patient's wishes - Continue CRRT/interventions for now; plan to revisit 2/12AM - Open to transition to comfort-focused care if patient clinically declines  Best Practice (right click and "Reselect all SmartList Selections" daily)   Diet/type: NPO w/ oral meds DVT prophylaxis systemic heparin  Pressure ulcer(s): N/A GI prophylaxis: PPI Lines: N/A Foley:  Yes, and it is still needed Code Status:  full code Last date of multidisciplinary goals of care discussion [pending]  Critical care time:    The patient is critically ill with multiple organ system failure and requires high complexity decision making for assessment and support, frequent evaluation and titration of therapies, advanced monitoring, review of radiographic studies and interpretation of complex data.   Critical Care Time devoted to patient care services, exclusive of separately billable procedures, described in this note is 36 minutes.  Star East, PA-C [facility] 06/03/23 11:14 AM  Please see [email or web address] for pager details.  From 7A-7P if no response, please call [phone] After hours, please call ELink [phone]

## 2023-06-03 NOTE — Plan of Care (Signed)
  Problem: Safety: Goal: Non-violent Restraint(s) Outcome: Progressing   Problem: Coping: Goal: Ability to adjust to condition or change in health will improve Outcome: Progressing   Problem: Fluid Volume: Goal: Ability to maintain a balanced intake and output will improve Outcome: Progressing   Problem: Tissue Perfusion: Goal: Adequacy of tissue perfusion will improve Outcome: Progressing   Problem: Clinical Measurements: Goal: Ability to maintain clinical measurements within normal limits will improve Outcome: Progressing Goal: Will remain free from infection Outcome: Progressing Goal: Diagnostic test results will improve Outcome: Progressing Goal: Respiratory complications will improve Outcome: Progressing Goal: Cardiovascular complication will be avoided Outcome: Progressing

## 2023-06-03 NOTE — Progress Notes (Signed)
 Patient ID: [name], male   DOB: Feb 09, 1946, 78 y.o.   MRN: [id number] [facility] Progress Note   Assessment/ Plan:   1. Acute kidney Injury on chronic kidney disease stage IV: Baseline creatinine 2.7-3.0.  Admitted with shock (hypovolemic versus septic) and likely ATN with profound metabolic acidosis prompting need to start CRRT.  Blood gas this morning shows adequate alkalinization and I will switch his CRRT prescription and discontinue sodium bicarbonate  piggyback drip.  Hemodynamically improving, switch to net even fluid balance on CRRT to limit volume overload.  He is anuric. 2.  Shock: Appears to be hypovolemic versus septic.  Antihypertensive therapy on hold (atenolol and losartan ) and appears to have had satisfactory volume expansion overnight.  Remains on norepinephrine  with efforts at weaning based on hemodynamic status.  On empiric broad-spectrum antimicrobial coverage with vancomycin  and cefepime  pending cultures/infection workup. 3.  Non-ST elevation MI: With prior history of coronary artery disease and suspected demand ischemia rather than significant ACS. 4.  Anion gap metabolic acidosis: Started yesterday on CRRT with pre and post filter bicarbonate along with bicarbonate piggyback drip.  Arterial blood gas this morning reflects mildly alkaline pH with improved bicarbonate level.  Switch prescription to reduce bicarbonate load.  Subjective:   Without acute events overnight on CRRT   Objective:   BP 124/60   Pulse 85   Temp 97.8 F (36.6 C) (Oral)   Resp (!) 24   Ht 6\' 2"  (1.88 m)   Wt 77.7 kg   SpO2 93%   BMI 21.99 kg/m   Intake/Output Summary (Last 24 hours) at 06/03/2023 0757 Last data filed at 06/03/2023 0700 Gross per 24 hour  Intake 2705.66 ml  Output 1019 ml  Net 1686.66 ml   Weight change:   Physical Exam: Gen: Intermittently moaning, awakens to voice CVS: Pulse regular rhythm, normal rate, S1 and S2 normal Resp: Clear to  auscultation bilaterally, no rales/rhonchi, on oxygen via [place] Abd: Soft, flat, nontender, bowel sounds normal Ext: No lower extremity edema  Imaging: US  RENAL Result Date: 06/02/2023 CLINICAL DATA:  Acute renal failure superimposed on stage IV chronic renal disease. EXAM: RENAL / URINARY TRACT ULTRASOUND COMPLETE COMPARISON:  None Available. FINDINGS: Right Kidney: Renal measurements: 9.3 cm x 4.8 cm x 6.1 cm = volume: 141.60 mL. Diffusely increased echogenicity of the renal parenchyma is noted. A 1.7 cm x 1.0 cm x 1.3 cm simple right renal cyst is seen. No hydronephrosis is visualized. Left Kidney: Renal measurements: 10.3 cm x 4.8 cm x 4.5 cm = volume: 116.7 mL. Diffusely increased echogenicity of the renal parenchyma is noted. No mass or hydronephrosis visualized. Bladder: A Foley catheter is in place. Other: None. IMPRESSION: 1. Bilateral echogenic kidneys which may represent sequelae associated with medical renal disease. 2. Simple right renal cyst. Electronically Signed   By: Virgle Grime M.D.   On: 06/02/2023 20:50   DG Chest Port 1 View Result Date: 06/02/2023 CLINICAL DATA:  Central line placement EXAM: PORTABLE CHEST 1 VIEW COMPARISON:  06/02/2023, 1:48 a.m. FINDINGS: Interval placement of large-bore right neck multi lumen vascular catheter, tip near the superior cavoatrial junction. 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 Mild diffuse interstitial opacity. No acute osseous findings. IMPRESSION: 1. Interval placement of large-bore right neck multi lumen vascular catheter, tip near the superior cavoatrial junction. No pneumothorax. 2. Cardiomegaly with mild diffuse interstitial opacity, consistent with edema. Electronically Signed   By: Fredricka Jenny M.D.   On: 06/02/2023 16:48  Labs: BMET Recent Labs  Lab 06/02/23 1051 06/02/23 1116 06/02/23 1431 06/02/23 2125 06/02/23 2220 06/03/23 0357 06/03/23 0556  NA 137  137 139 138 139 139 138 138  K 5.4*  5.6* 5.5* 5.5*  4.0 3.6 3.0* 3.0*  CL 114*  115*  --   --  107  --  99  --   CO2 7*  <7*  --   --  12*  --  19*  --   GLUCOSE 142*  141*  --   --  229*  --  183*  --   BUN 90*  88*  --   --  81*  --  55*  --   CREATININE 5.76*  5.70*  --   --  5.11*  --  3.96*  --   CALCIUM  7.7*  7.8*  --   --  7.3*  --  7.5*  --   PHOS  --   --   --   --   --  4.5  --    CBC Recent Labs  Lab 06/02/23 1051 06/02/23 1116 06/02/23 1632 06/02/23 2220 06/03/23 0357 06/03/23 0556  WBC 13.6*  13.5*  --  16.9*  --  13.1*  --   NEUTROABS 12.4*  --   --   --   --   --   HGB 8.5*  8.7*   < > 8.2* 7.1* 7.7* 6.8*  HCT 27.6*  28.1*   < > 25.9* 21.0* 21.5* 20.0*  MCV 102.2*  102.6*  --  100.4*  --  91.9  --   PLT 148*  146*  --  166  --  142*  --    < > = values in this interval not displayed.    Medications:     aspirin  EC  81 mg Oral Q breakfast   Chlorhexidine  Gluconate Cloth  6 each Topical Daily   donepezil   5 mg Oral QHS   insulin  aspart  0-9 Units Subcutaneous Q4H   pantoprazole  (PROTONIX ) IV  40 mg Intravenous Q12H   rosuvastatin   20 mg Oral Daily   sertraline   150 mg Oral Daily   sodium zirconium cyclosilicate   5 g Oral Once   vancomycin  variable dose per unstable renal function (pharmacist dosing)   Does not apply See admin instructions    Clevester Dally, MD 06/03/2023, 7:57 AM

## 2023-06-03 NOTE — Progress Notes (Signed)
 PHARMACY - ANTICOAGULATION Pharmacy Consult for heparin  Indication: chest pain/ACS Brief A/P: Heparin  level supratherapeutic Decrease Heparin  rate  Allergies  Allergen Reactions   Buprenorphine Other (See Comments)   Buprenorphine Hcl Other (See Comments)   Flomax [Tamsulosin Hcl]     UNSPECIFIED REACTION to HIGH DOSE    Morphine And Codeine     UNSPECIFIED REACTION     Patient Measurements: Height: 6\' 2"  (188 cm) Weight: 73.7 kg (162 lb 7.7 oz) IBW/kg (Calculated) : 82.2  Vital Signs: Temp: 97.9 F (36.6 C) (02/09 2315) Temp Source: Oral (02/09 2315) BP: 97/52 (02/10 0100) Pulse Rate: 84 (02/10 0100)  Labs: Recent Labs    06/02/23 1051 06/02/23 1116 06/02/23 1431 06/02/23 1632 06/02/23 2125 06/02/23 2220 06/02/23 2323  HGB 8.5*  8.7*   < > 8.2* 8.2*  --  7.1*  --   HCT 27.6*  28.1*   < > 24.0* 25.9*  --  21.0*  --   PLT 148*  146*  --   --  166  --   --   --   HEPARINUNFRC  --   --   --   --  >1.10*  --  >1.10*  CREATININE 5.76*  5.70*  --   --   --  5.11*  --   --   TROPONINIHS 3,466*  3,427*  --   --   --   --   --   --    < > = values in this interval not displayed.    Estimated Creatinine Clearance: 12.6 mL/min (A) (by C-G formula based on SCr of 5.11 mg/dL (H)).  Assessment: 78 y.o. male with elevated troponin, possible ACS, for heparin   Goal of Therapy:  Heparin  level 0.3-0.7 units/ml Monitor platelets by anticoagulation protocol: Yes   Plan:  Hold heparin  x 1 hour, then decrease heparin  750 units/hr Check heparin  level in 8 hours.  Claudine Cullens, PharmD, BCPS 06/03/2023 1:48 AM

## 2023-06-03 NOTE — Progress Notes (Signed)
 RN explained that the patient has been irritated this morning and is finally resting so EKG will be performed at a later time.

## 2023-06-03 NOTE — Progress Notes (Addendum)
 PHARMACY - ANTICOAGULATION CONSULT NOTE  Pharmacy Consult for heparin  Indication: chest pain/ACS  Allergies  Allergen Reactions   Buprenorphine Other (See Comments)   Buprenorphine Hcl Other (See Comments)   Flomax [Tamsulosin Hcl]     UNSPECIFIED REACTION to HIGH DOSE    Morphine And Codeine     UNSPECIFIED REACTION     Patient Measurements: Height: 6\' 2"  (188 cm) Weight: 77.7 kg (171 lb 4.8 oz) IBW/kg (Calculated) : 82.2 Heparin  Dosing Weight: TBW  Vital Signs: Temp: 98.6 F (37 C) (02/10 1130) Temp Source: Axillary (02/10 1130) BP: 144/63 (02/10 1200) Pulse Rate: 84 (02/10 1234)  Labs: Recent Labs    06/02/23 1051 06/02/23 1116 06/02/23 1632 06/02/23 2125 06/02/23 2220 06/02/23 2323 06/03/23 0357 06/03/23 0556 06/03/23 1146  HGB 8.5*  8.7*   < > 8.2*  --    < >  --  7.7* 6.8* 8.1*  HCT 27.6*  28.1*   < > 25.9*  --    < >  --  21.5* 20.0* 22.7*  PLT 148*  146*  --  166  --   --   --  142*  --  158  HEPARINUNFRC  --   --   --  >1.10*  --  >1.10*  --   --  0.61  CREATININE 5.76*  5.70*  --   --  5.11*  --   --  3.96*  --   --   TROPONINIHS 3,466*  3,427*  --   --   --   --   --   --   --   --    < > = values in this interval not displayed.    Estimated Creatinine Clearance: 17.2 mL/min (A) (by C-G formula based on SCr of 3.96 mg/dL (H)).   Medical History: Past Medical History:  Diagnosis Date   Adenocarcinoma of prostate (HCC)    Anxiety    CAD (coronary artery disease)    a. s/p prior stenting of LAD in 1996 and angioplasty alone in 1999 to LAD by review of prior notes. b. 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  Heparin  level came back therapeutic at 0.61, on heparin  infusion at 750 units/hr. Hgb 8.1, plt 158. No s/sx of bleeding or infusion issues. Confirmed level drawn appropriately.   Goal of Therapy:  Heparin  level 0.3-0.7 units/ml Monitor platelets by anticoagulation protocol: Yes   Plan:  Continue heparin  infusion at 750 units/hr Order heparin  level with AM labs given therapeutic Monitor daily HL, CBC, and for s/sx of bleeding  Plan for 48 hr total  Thank you for allowing pharmacy to participate in this patient's care,  Nieves Bars, PharmD, BCCCP Clinical Pharmacist  Phone: [phone] 06/03/2023 1:07 PM  Please check AMION for all [facility] Pharmacy phone numbers After 10:00 PM, call Main Pharmacy [phone]

## 2023-06-03 NOTE — TOC Initial Note (Signed)
 Transition of Care [facility]) - Initial/Assessment Note    Patient Details  Name: [name] MRN: [id number] Date of Birth: 06/04/1945  Transition of Care [facility] CM/SW Contact:    Benjiman Bras, RN Phone Number: [phone] 06/03/2023, 4:44 PM  Clinical Narrative:                  TOC CM spoke to pt's wife. States she is waiting for him to recover, but does not want him to not have good quality. Niece is here to assist her at home.  Will continue to follow for dc needs.    Expected Discharge Plan: Skilled Nursing Facility Barriers to Discharge: Continued Medical Work up   Patient Goals and CMS Choice            Expected Discharge Plan and Services   Discharge Planning Services: CM Consult   Living arrangements for the past 2 months: Single Family Home                                      Prior Living Arrangements/Services Living arrangements for the past 2 months: Single Family Home Lives with:: Spouse Patient language and need for interpreter reviewed:: Yes            Current home services: DME (cane)    Activities of Daily Living   ADL Screening (condition at time of admission) Independently performs ADLs?: No Does the patient have a NEW difficulty with bathing/dressing/toileting/self-feeding that is expected to last >3 days?: No Does the patient have a NEW difficulty with getting in/out of bed, walking, or climbing stairs that is expected to last >3 days?: No Does the patient have a NEW difficulty with communication that is expected to last >3 days?: No Is the patient deaf or have difficulty hearing?: No Does the patient have difficulty seeing, even when wearing glasses/contacts?: No Does the patient have difficulty concentrating, remembering, or making decisions?: Yes  Permission Sought/Granted Permission sought to share information with : Case Manager, Family Supports Permission granted to share information with : Yes, Verbal  Permission Granted  Share Information with NAME: Maejor Andreoni     Permission granted to share info w Relationship: wife  Permission granted to share info w Contact Information: [phone]  Emotional Assessment              Admission diagnosis:  Metabolic acidosis [E87.20] Acute on chronic renal failure (HCC) [N17.9, N18.9] NSTEMI (non-ST elevated myocardial infarction) (HCC) [I21.4] AKI (acute kidney injury) (HCC) [N17.9] Sepsis with acute renal failure and septic shock, due to unspecified organism, unspecified acute renal failure type (HCC) [A41.9, R65.21, N17.9] Encephalopathy, unspecified type [G93.40] Patient Active Problem List   Diagnosis Date Noted   Acute on chronic renal failure (HCC) 06/02/2023   NSTEMI (non-ST elevated myocardial infarction) (HCC) 06/02/2023   Encephalopathy 06/02/2023   Metabolic acidosis 06/02/2023   Tobacco use disorder 05/31/2023   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/2023   Restless legs 05/31/2023   Major depressive disorder, recurrent, moderate (HCC) 05/31/2023   History of agent Orange exposure 05/31/2023   ERRONEOUS ENCOUNTER--DISREGARD 05/23/2023   Posttraumatic stress disorder 04/12/2023   Depression 03/29/2023   Dyslipidemia 03/29/2023   Elevated troponin 03/29/2023   AKI (acute kidney injury) (HCC) 03/29/2023   Acute kidney failure, unspecified (HCC) 03/28/2023   Type 2 diabetes mellitus with hypoglycemia without coma (HCC) 03/28/2023   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 03/28/2023   Bilateral carotid artery disease, unspecified type (HCC) 02/27/2023   Continuous chewing tobacco dependence 02/27/2023   Pes cavus 02/27/2023   Obstructive sleep apnea (adult) (pediatric) 02/27/2023   Metatarsalgia 02/27/2023   Hypersomnia with sleep apnea 02/27/2023   Hearing loss 02/27/2023   Encounter for screening for malignant neoplasm of respiratory organs 02/27/2023   Disorder of refraction and accommodation 02/27/2023   Exposure to  Agent [facility] 02/27/2023   Alcohol  dependence in remission (HCC) 02/27/2023   SOB (shortness of breath) 01/22/2023   Early satiety 01/22/2023   Dementia (HCC) 12/05/2022   Gastroesophageal reflux disease without esophagitis 04/23/2022   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 04/23/2022   Presence of coronary angioplasty implant and graft 04/23/2022   Presence of aortocoronary bypass graft 04/23/2022   Personal history of urinary calculi 04/23/2022   Personal history of nicotine dependence 04/23/2022   Other specified disorders of kidney and ureter 04/23/2022   Long term (current) use of oral hypoglycemic drugs 04/23/2022   Long term (current) use of aspirin  04/23/2022   Hyperlipidemia, unspecified 04/23/2022   History of falling 04/23/2022   Carpal tunnel syndrome, unspecified upper limb 04/23/2022   Combined rheumatic disorders of mitral, 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/23/2022   Hepatic steatosis 02/26/2022   Crohn's colitis, without complications (HCC) 01/30/2022   History of malignant neoplasm of prostate 11/17/2021   Depression, unspecified 11/17/2021   Chronic kidney disease, stage 3a (HCC) 11/17/2021   Athscl heart disease of native coronary artery w/o ang pctrs 01/30/2021   Postsurgical aortocoronary bypass status 04/01/2018   Peripheral vascular disease (HCC) 06/13/2016   Malignant neoplasm of prostate (HCC) 06/06/2016   CAD (coronary artery disease) 03/21/2013   Unspecified optic neuritis 12/06/2011   Type 2 diabetes mellitus (HCC) 06/02/2007   Hyperlipidemia, mixed 06/02/2007   Anxiety disorder, unspecified 06/02/2007   Benign essential hypertension 06/02/2007   GERD (gastroesophageal reflux disease) 06/02/2007   Crohn's disease, unspecified, without complications (HCC) 06/02/2007   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 - [place], [place] - [address] [place] [address] Phone: [phone] Fax: [phone]  [facility] - [place], [place] - [address] [place] [address] Phone: [phone] Fax: [phone]     Social Drivers of Health (SDOH) Social History: SDOH Screenings   Food Insecurity: Patient Unable To Answer (06/02/2023)  Housing: Patient Unable To Answer (06/02/2023)  Transportation Needs: Patient Unable To Answer (06/02/2023)  Utilities: Patient Unable To Answer (06/02/2023)  Depression (PHQ2-9): High Risk (12/04/2022)  Financial Resource Strain: Low Risk  (01/24/2022)  Social Connections: Patient Unable To Answer (06/02/2023)  Stress: No Stress Concern Present (01/24/2022)  Tobacco Use: Medium Risk (06/02/2023)   SDOH Interventions:     Readmission Risk Interventions     No data to display

## 2023-06-03 NOTE — Progress Notes (Signed)
 Pharmacy Antibiotic Note  [name] is a 78 y.o. male admitted on 06/02/2023 as transfer from [facility], concern for sepsis.  Pharmacy has been consulted for vancomycin  and Zosyn  dosing. Pt on CRRT.    Plan: Vancomycin  750mg  IV q24h Zosyn  3.375g IV over 30 min q6h  Height: 6\' 2"  (188 cm) Weight: 77.7 kg (171 lb 4.8 oz) IBW/kg (Calculated) : 82.2  Temp (24hrs), Avg:98.3 F (36.8 C), Min:97.8 F (36.6 C), Max:98.7 F (37.1 C)  Recent Labs  Lab 06/02/23 1051 06/02/23 1117 06/02/23 1632 06/02/23 2125 06/03/23 0357 06/03/23 1146 06/03/23 1741  WBC 13.6*  13.5*  --  16.9*  --  13.1* 15.3* 12.7*  CREATININE 5.76*  5.70*  --   --  5.11* 3.96*  --  2.37*  LATICACIDVEN  --  0.7  --   --   --   --   --     Estimated Creatinine Clearance: 28.7 mL/min (A) (by C-G formula based on SCr of 2.37 mg/dL (H)).    Allergies  Allergen Reactions   Buprenorphine Other (See Comments)   Buprenorphine Hcl Other (See Comments)   Flomax [Tamsulosin Hcl]     UNSPECIFIED REACTION to HIGH DOSE    Morphine And Codeine     UNSPECIFIED REACTION     Levin Reamer, PharmD, BCPS, [facility] Clinical Pharmacist [phone] Please check AMION for all [facility] Pharmacy numbers 06/03/2023

## 2023-06-03 NOTE — Patient Instructions (Signed)
 Visit Information  Thank you for taking time to visit with me today. Please don't hesitate to contact me if I can be of assistance to you.   Following are the goals we discussed today:   Goals Addressed             This Visit's Progress    COMPLETED: manage hypertension, diabetes crohn's,memory MD office visits - VBCI care coordination       Wife reached  She reports patient not doing well and may pass soon Palliative/hospice care   Interventions Today    Flowsheet Row Most Recent Value  Chronic Disease   Chronic disease during today's visit Other  [hospitalized at [facility]]  General Interventions   General Interventions Discussed/Reviewed General Interventions Reviewed  Mental Health Interventions   Mental Health Discussed/Reviewed Mental Health Reviewed, Grief and Loss              Our next appointment is by telephone on pending at pending  Please call the care guide team at [phone] if you need to cancel or reschedule your appointment.   If you are experiencing a Mental Health or Behavioral Health Crisis or need someone to talk to, please call the Suicide and Crisis Lifeline: 988 call the [place] National Suicide Prevention Lifeline: 825-477-6210 or TTY: 3235629720 TTY 613-549-9164) to talk to a trained counselor call 1-800-273-TALK (toll free, 24 hour hotline) call the [facility]: 757-718-7094 call 911   No computer access, no preference for copy of AVS    The patient has been provided with contact information for the care management team and has been advised to call with any health related questions or concerns.    Georga Stys L. Noelle Penner, RN, BSN, CCM [place]  [facility], [facility] RN Care Manager Direct Dial: [phone]  Fax: [phone] Mailing Address: [address]  [place] [address] Website: [email or web address]

## 2023-06-04 DIAGNOSIS — G9341 Metabolic encephalopathy: Secondary | ICD-10-CM | POA: Diagnosis not present

## 2023-06-04 DIAGNOSIS — I959 Hypotension, unspecified: Secondary | ICD-10-CM | POA: Diagnosis not present

## 2023-06-04 LAB — GLUCOSE, CAPILLARY
Glucose-Capillary: 149 mg/dL — ABNORMAL HIGH (ref 70–99)
Glucose-Capillary: 152 mg/dL — ABNORMAL HIGH (ref 70–99)

## 2023-06-04 LAB — PROCALCITONIN: Procalcitonin: 0.2 ng/mL

## 2023-06-04 LAB — RENAL FUNCTION PANEL
Albumin: 2.6 g/dL — ABNORMAL LOW (ref 3.5–5.0)
Anion gap: 13 (ref 5–15)
BUN: 20 mg/dL (ref 8–23)
CO2: 21 mmol/L — ABNORMAL LOW (ref 22–32)
Calcium: 7.6 mg/dL — ABNORMAL LOW (ref 8.9–10.3)
Chloride: 102 mmol/L (ref 98–111)
Creatinine, Ser: 1.87 mg/dL — ABNORMAL HIGH (ref 0.61–1.24)
GFR, Estimated: 37 mL/min — ABNORMAL LOW (ref >60)
Glucose, Bld: 159 mg/dL — ABNORMAL HIGH (ref 70–99)
Phosphorus: 3 mg/dL (ref 2.5–4.6)
Potassium: 4 mmol/L (ref 3.5–5.1)
Sodium: 136 mmol/L (ref 135–145)

## 2023-06-04 LAB — CBC
HCT: 23.1 % — ABNORMAL LOW (ref 39.0–52.0)
Hemoglobin: 7.8 g/dL — ABNORMAL LOW (ref 13.0–17.0)
MCH: 32 pg (ref 26.0–34.0)
MCHC: 33.8 g/dL (ref 30.0–36.0)
MCV: 94.7 fL (ref 80.0–100.0)
Platelets: 130 10*3/uL — ABNORMAL LOW (ref 150–400)
RBC: 2.44 MIL/uL — ABNORMAL LOW (ref 4.22–5.81)
RDW: 13.9 % (ref 11.5–15.5)
WBC: 11.3 10*3/uL — ABNORMAL HIGH (ref 4.0–10.5)
nRBC: 0 % (ref 0.0–0.2)

## 2023-06-04 LAB — HEPARIN LEVEL (UNFRACTIONATED): Heparin Unfractionated: 0.48 [IU]/mL (ref 0.30–0.70)

## 2023-06-04 LAB — MAGNESIUM: Magnesium: 2.2 mg/dL (ref 1.7–2.4)

## 2023-06-04 LAB — BRAIN NATRIURETIC PEPTIDE: B Natriuretic Peptide: 1109.1 pg/mL — ABNORMAL HIGH (ref 0.0–100.0)

## 2023-06-04 MED ORDER — ACETAMINOPHEN 325 MG PO TABS: 650.0000 mg | ORAL_TABLET | Freq: Four times a day (QID) | ORAL | Status: DC | PRN

## 2023-06-04 MED ORDER — GLYCOPYRROLATE 0.2 MG/ML IJ SOLN: 0.2000 mg | INTRAMUSCULAR | Status: DC | PRN

## 2023-06-04 MED ORDER — ALBUTEROL SULFATE (2.5 MG/3ML) 0.083% IN NEBU: 2.5000 mg | INHALATION_SOLUTION | RESPIRATORY_TRACT | Status: DC | PRN

## 2023-06-04 MED ORDER — GLYCOPYRROLATE 1 MG PO TABS
1.0000 mg | ORAL_TABLET | ORAL | Status: DC | PRN
  Filled 2023-06-04: qty 1

## 2023-06-04 MED ORDER — ORAL CARE MOUTH RINSE
15.0000 mL | OROMUCOSAL | Status: DC
  Administered 2023-06-04 – 2023-06-05 (×5): 15 mL via OROMUCOSAL

## 2023-06-04 MED ORDER — ONDANSETRON HCL 4 MG/2ML IJ SOLN: 4.0000 mg | Freq: Four times a day (QID) | INTRAMUSCULAR | Status: DC | PRN

## 2023-06-04 MED ORDER — ONDANSETRON 4 MG PO TBDP: 4.0000 mg | ORAL_TABLET | Freq: Four times a day (QID) | ORAL | Status: DC | PRN

## 2023-06-04 MED ORDER — FENTANYL CITRATE PF 50 MCG/ML IJ SOSY
25.0000 ug | PREFILLED_SYRINGE | INTRAMUSCULAR | Status: DC | PRN
  Administered 2023-06-04: 50 ug via INTRAVENOUS
  Administered 2023-06-04: 25 ug via INTRAVENOUS
  Administered 2023-06-04: 100 ug via INTRAVENOUS
  Administered 2023-06-04 (×5): 50 ug via INTRAVENOUS
  Administered 2023-06-04: 25 ug via INTRAVENOUS
  Administered 2023-06-04 (×2): 50 ug via INTRAVENOUS
  Administered 2023-06-05 (×4): 100 ug via INTRAVENOUS
  Filled 2023-06-04 (×2): qty 2
  Filled 2023-06-04 (×3): qty 1
  Filled 2023-06-04: qty 2
  Filled 2023-06-04: qty 1
  Filled 2023-06-04: qty 2
  Filled 2023-06-04 (×5): qty 1
  Filled 2023-06-04 (×3): qty 2

## 2023-06-04 MED ORDER — GLYCOPYRROLATE 0.2 MG/ML IJ SOLN
0.2000 mg | INTRAMUSCULAR | Status: DC | PRN
  Administered 2023-06-04 – 2023-06-05 (×3): 0.2 mg via INTRAVENOUS
  Filled 2023-06-04 (×5): qty 1

## 2023-06-04 MED ORDER — HALOPERIDOL LACTATE 5 MG/ML IJ SOLN: 2.5000 mg | INTRAMUSCULAR | Status: DC | PRN

## 2023-06-04 MED ORDER — MIDAZOLAM HCL 2 MG/2ML IJ SOLN
2.0000 mg | INTRAMUSCULAR | Status: DC | PRN
  Administered 2023-06-05: 2 mg via INTRAVENOUS
  Filled 2023-06-04: qty 2

## 2023-06-04 MED ORDER — ACETAMINOPHEN 650 MG RE SUPP: 650.0000 mg | Freq: Four times a day (QID) | RECTAL | Status: DC | PRN

## 2023-06-04 MED ORDER — DIPHENHYDRAMINE HCL 50 MG/ML IJ SOLN
25.0000 mg | INTRAMUSCULAR | Status: DC | PRN
  Filled 2023-06-04: qty 1

## 2023-06-04 MED ORDER — POLYVINYL ALCOHOL 1.4 % OP SOLN: 1.0000 [drp] | Freq: Four times a day (QID) | OPHTHALMIC | Status: DC | PRN

## 2023-06-04 MED ORDER — ORAL CARE MOUTH RINSE: 15.0000 mL | OROMUCOSAL | Status: DC | PRN

## 2023-06-04 NOTE — Progress Notes (Signed)
Patient ID: [name], male   DOB: [birth year], 78 y.o.   MRN: [id number]  [facility] Progress Note   Assessment/ Plan:   1. Acute kidney Injury on chronic kidney disease stage IV: Baseline creatinine 2.7-3.0.  Admitted with shock (hypovolemic versus septic) and likely ATN with profound metabolic acidosis prompting need to start CRRT.  Blood gas this morning shows adequate alkalinization and I will switch his CRRT prescription and discontinue sodium bicarbonate piggyback drip.  Hemodynamically improving, switch to net even fluid balance on CRRT to limit volume overload.  He is anuric. - stop CRRT - transitioning to CC - will sign off, call with questions 2.  Shock: Appears to be hypovolemic versus septic.  Antihypertensive therapy on hold (atenolol and losartan) and appears to have had satisfactory volume expansion overnight.  Remains on norepinephrine with efforts at weaning based on hemodynamic status.  On empiric broad-spectrum antimicrobial coverage with vancomycin and cefepime pending cultures/infection workup. 3.  Non-ST elevation MI: With prior history of coronary artery disease and suspected demand ischemia rather than significant ACS. 4.  Anion gap metabolic acidosis: Started yesterday on CRRT with pre and post filter bicarbonate along with bicarbonate piggyback drip.  Arterial blood gas this morning reflects mildly alkaline pH with improved bicarbonate level.  Switch prescription to reduce bicarbonate load.  Subjective:    CRRT continues now.  Declining.  Wife and dtr at the bedside.  Discussed transition to CC- they want to stop all interventions.  Have d/c'd CRRT and notified PCCM of desires.      Objective:   BP (!) 110/58   Pulse (!) 104   Temp 97.9 F (36.6 C) (Axillary)   Resp 15   Ht 6\' 2"  (1.88 m)   Wt 77.2 kg   SpO2 100%   BMI 21.85 kg/m   Intake/Output Summary (Last 24 hours) at 06/04/2023 5643 Last data filed at 06/04/2023 0900 Gross per 24  hour  Intake 714.6 ml  Output 852 ml  Net -137.4 ml   Weight change: 3.5 kg  Physical Exam: Gen: Intermittently moaning, awakens to voice CVS: Pulse regular rhythm, normal rate, S1 and S2 normal Resp: Clear to auscultation bilaterally, no rales/rhonchi, on oxygen via [place] Abd: Soft, flat, nontender, bowel sounds normal Ext: No lower extremity edema  Imaging: ECHOCARDIOGRAM LIMITED Result Date: 06/03/2023    ECHOCARDIOGRAM LIMITED REPORT   Patient Name:   [name] Date of Exam: 06/03/2023 Medical Rec #:  [id number]             Height:       74.0 in Accession #:    [id number]            Weight:       171.3 lb Date of Birth:  02/23/1946            BSA:          2.035 m Patient Age:    77 years              BP:           124/60 mmHg Patient Gender: M                     HR:           84 bpm. Exam Location:  Inpatient Procedure: Limited Echo and Limited Color Doppler Indications:    Dspnea R06.00  History:        Patient has prior history of Echocardiogram  examinations, most                 recent 03/29/2023. Previous Myocardial Infarction and CAD, CKD,                 stage 3, Signs/Symptoms:Shortness of Breath; Risk                 Factors:Hypertension, Sleep Apnea, Diabetes, Dyslipidemia and                 Current Smoker.  Sonographer:    Lucendia Herrlich RCS Referring Phys: [id number] PETER E BABCOCK  Sonographer Comments: Image acquisition challenging due to patient body habitus. IMPRESSIONS  1. Hypokinesis of the distal inferolateral wall; akinesis of the distal inferior wall; overall mild LV dysfunction.  2. Left ventricular ejection fraction, by estimation, is 45 to 50%. The left ventricle has mildly decreased function. The left ventricle demonstrates regional wall motion abnormalities (see scoring diagram/findings for description). Left ventricular diastolic parameters are consistent with Grade I diastolic dysfunction (impaired relaxation).  3. Right ventricular systolic function is normal.  The right ventricular size is normal.  4. The mitral valve is normal in structure. Mild mitral valve regurgitation. No evidence of mitral stenosis.  5. The aortic valve is tricuspid. Aortic valve regurgitation is mild. Aortic valve sclerosis is present, with no evidence of aortic valve stenosis.  6. The inferior vena cava is dilated in size with >50% respiratory variability, suggesting right atrial pressure of 8 mmHg. FINDINGS  Left Ventricle: Left ventricular ejection fraction, by estimation, is 45 to 50%. The left ventricle has mildly decreased function. The left ventricle demonstrates regional wall motion abnormalities. The left ventricular internal cavity size was normal in size. There is no left ventricular hypertrophy. Left ventricular diastolic parameters are consistent with Grade I diastolic dysfunction (impaired relaxation). Right Ventricle: The right ventricular size is normal. Right ventricular systolic function is normal. Left Atrium: Left atrial size was normal in size. Right Atrium: Right atrial size was normal in size. Pericardium: There is no evidence of pericardial effusion. Mitral Valve: The mitral valve is normal in structure. Mild mitral valve regurgitation. No evidence of mitral valve stenosis. Tricuspid Valve: The tricuspid valve is normal in structure. Tricuspid valve regurgitation is mild . No evidence of tricuspid stenosis. Aortic Valve: The aortic valve is tricuspid. Aortic valve regurgitation is mild. Aortic valve sclerosis is present, with no evidence of aortic valve stenosis. Aortic valve peak gradient measures 4.8 mmHg. Pulmonic Valve: The pulmonic valve was normal in structure. Pulmonic valve regurgitation is not visualized. No evidence of pulmonic stenosis. Aorta: The aortic root is normal in size and structure. Venous: The inferior vena cava is dilated in size with greater than 50% respiratory variability, suggesting right atrial pressure of 8 mmHg. IAS/Shunts: No atrial level shunt  detected by color flow Doppler. Additional Comments: Hypokinesis of the distal inferolateral wall; akinesis of the distal inferior wall; overall mild LV dysfunction.  LEFT VENTRICLE PLAX 2D LVIDd:         4.70 cm   Diastology LVIDs:         3.40 cm   LV e' medial:    5.93 cm/s LV PW:         0.90 cm   LV E/e' medial:  13.0 LV IVS:        0.90 cm   LV e' lateral:   15.00 cm/s LVOT diam:     2.10 cm   LV E/e' lateral: 5.1 LV SV:  36 LV SV Index:   18 LVOT Area:     3.46 cm  RIGHT VENTRICLE            IVC RV S prime:     7.88 cm/s  IVC diam: 2.20 cm TAPSE (M-mode): 1.3 cm LEFT ATRIUM         Index LA diam:    3.90 cm 1.92 cm/m  AORTIC VALVE AV Area (Vmax): 2.05 cm AV Vmax:        110.00 cm/s AV Peak Grad:   4.8 mmHg LVOT Vmax:      65.10 cm/s LVOT Vmean:     40.400 cm/s LVOT VTI:       0.104 m  AORTA Ao Root diam: 3.20 cm Ao Asc diam:  2.90 cm MITRAL VALVE               TRICUSPID VALVE MV Area (PHT): 5.02 cm    TR Peak grad:   25.6 mmHg MV Decel Time: 151 msec    TR Vmax:        253.00 cm/s MV E velocity: 77.10 cm/s MV A velocity: 79.10 cm/s  SHUNTS MV E/A ratio:  0.97        Systemic VTI:  0.10 m                            Systemic Diam: 2.10 cm Olga Millers MD Electronically signed by Olga Millers MD Signature Date/Time: 06/03/2023/12:45:45 PM    Final    US RENAL Result Date: 06/02/2023 CLINICAL DATA:  Acute renal failure superimposed on stage IV chronic renal disease. EXAM: RENAL / URINARY TRACT ULTRASOUND COMPLETE COMPARISON:  None Available. FINDINGS: Right Kidney: Renal measurements: 9.3 cm x 4.8 cm x 6.1 cm = volume: 141.60 mL. Diffusely increased echogenicity of the renal parenchyma is noted. A 1.7 cm x 1.0 cm x 1.3 cm simple right renal cyst is seen. No hydronephrosis is visualized. Left Kidney: Renal measurements: 10.3 cm x 4.8 cm x 4.5 cm = volume: 116.7 mL. Diffusely increased echogenicity of the renal parenchyma is noted. No mass or hydronephrosis visualized. Bladder: A Foley catheter is  in place. Other: None. IMPRESSION: 1. Bilateral echogenic kidneys which may represent sequelae associated with medical renal disease. 2. Simple right renal cyst. Electronically Signed   By: Aram Candela M.D.   On: 06/02/2023 20:50   DG Chest Port 1 View Result Date: 06/02/2023 CLINICAL DATA:  Central line placement EXAM: PORTABLE CHEST 1 VIEW COMPARISON:  06/02/2023, 1:48 a.m. FINDINGS: Interval placement of large-bore right neck multi lumen vascular catheter, tip near the superior cavoatrial junction. 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 Mild diffuse interstitial opacity. No acute osseous findings. IMPRESSION: 1. Interval placement of large-bore right neck multi lumen vascular catheter, tip near the superior cavoatrial junction. No pneumothorax. 2. Cardiomegaly with mild diffuse interstitial opacity, consistent with edema. Electronically Signed   By: Jearld Lesch M.D.   On: 06/02/2023 16:48    Labs: BMET Recent Labs  Lab 06/02/23 1051 06/02/23 1116 06/02/23 1431 06/02/23 2125 06/02/23 2220 06/03/23 0357 06/03/23 0556 06/03/23 1741 06/04/23 0222  NA 137  137   < > 138 139 139 138 138 139 136  K 5.4*  5.6*   < > 5.5* 4.0 3.6 3.0* 3.0* 3.8 4.0  CL 114*  115*  --   --  107  --  99  --  101 102  CO2 7*  <7*  --   --  12*  --  19*  --  23 21*  GLUCOSE 142*  141*  --   --  229*  --  183*  --  133* 159*  BUN 90*  88*  --   --  81*  --  55*  --  29* 20  CREATININE 5.76*  5.70*  --   --  5.11*  --  3.96*  --  2.37* 1.87*  CALCIUM 7.7*  7.8*  --   --  7.3*  --  7.5*  --  7.6* 7.6*  PHOS  --   --   --   --   --  4.5  --  2.8 3.0   < > = values in this interval not displayed.   CBC Recent Labs  Lab 06/02/23 1051 06/02/23 1116 06/03/23 0357 06/03/23 0556 06/03/23 1146 06/03/23 1741 06/04/23 0222  WBC 13.6*  13.5*   < > 13.1*  --  15.3* 12.7* 11.3*  NEUTROABS 12.4*  --   --   --   --   --   --   HGB 8.5*  8.7*   < > 7.7* 6.8* 8.1* 7.7* 7.8*  HCT 27.6*   28.1*   < > 21.5* 20.0* 22.7* 21.5* 23.1*  MCV 102.2*  102.6*   < > 91.9  --  90.4 91.9 94.7  PLT 148*  146*   < > 142*  --  158 126* 130*   < > = values in this interval not displayed.    Medications:     aspirin EC  81 mg Oral Q breakfast   Chlorhexidine Gluconate Cloth  6 each Topical Daily   donepezil  5 mg Oral QHS   insulin aspart  0-9 Units Subcutaneous Q4H   mouth rinse  15 mL Mouth Rinse 4 times per day   pantoprazole (PROTONIX) IV  40 mg Intravenous Q12H   rosuvastatin  10 mg Oral Daily   sertraline  150 mg Oral Daily   Bufford Buttner, MD 06/04/2023, 9:26 AM

## 2023-06-04 NOTE — IPAL (Signed)
  Interdisciplinary Goals of Care Family Meeting   Date carried out:: 06/04/2023  Location of the meeting: Bedside  Member's involved: Physician, Bedside Registered Nurse, and Family Member or next of kin  Durable Power of Attorney or acting medical decision maker: wife    Discussion: We discussed goals of care for [name] .  I met with [name]'s wife this morning. He required frequent interventions for agitation overnight and is now having hemodynamic instability with hypoxia, rising oxygen requirements, and bradycardia. His wife doesn't want to prolong his dying or make him suffer. She is in agreement that the best thing for him is focusing on his comfort and discontinuing aggressive life support-discontinue CRRT and aggressive oxygen support.  Can transition back to nasal cannula.  Discontinue Foley.  RN present and agrees with plan.  Orders updated in chart.  Code status: Full DNR  Disposition: In-patient comfort care   Time spent for the meeting: 10 min  Steffanie Dunn 06/04/2023, 9:29 AM

## 2023-06-04 NOTE — Progress Notes (Signed)
Restraints discontinued when family arrived to visit with patient.   CRRT stopped per Dr. Beaulah Corin orders, per family request to not prolong patient's suffering.  Comfort measures initiated. EKG only present.   Family at bedside with patient. (Wife, son, daughter in law, and wife's niece).   RN will continue to monitor patient and family closely.

## 2023-06-04 NOTE — Progress Notes (Signed)
NAME:  [name], MRN:  [id number], DOB:  [birth year], LOS: 2 ADMISSION DATE:  06/02/2023, CONSULTATION DATE: 06/02/2023 REFERRING MD: Lanier Prude, CHIEF COMPLAINT: Acute-on-chronic renal failure hypotension, abnormal EKG and elevated troponin  History of Present Illness:  This is a 78 year old male patient with several comorbidities recently hospitalized back in December 2024 for acute on chronic renal failure resides at home, presented to [facility] emergency room with report of decreased oral intake, nausea and vomiting, worsening weakness, and some resting shortness of breath, some lethargy, and mild encephalopathy.  On initial EMS arrival he was hypotensive with systolic blood pressure in the 70s to 80s on arrival to the emergency room diagnostic evaluation included CT head which was negative for acute process, CT chest abdomen and pelvis was obtained there was no acute abnormality identified he did have a 4 mm right upper lobe pulmonary nodule COVID was negative influenza negative urinalysis with marked protein urea UDS negative sodium 143 anion gap greater than 15 creatinine 6.10, on record from his discharge in December creatinine appears to sit in the range from 2.7-2.8, HCO3 13.4 White blood cell count 11.7 Troponin I Initially 33, 6 hours spiked to 295 EKG showing concern for age unspecific anterior infarct and ST abnormality in the lateral leads He was started on IV hydration Placed on telemetry Cultures sent Empiric antibiotics administered Transferred to [facility] for definitive care critical care asked to admit  Pertinent Medical History:  CKD stage III a, poorly controlled diabetes with neuropathy, essential hypertension, dyslipidemia, GERD with esophagitis, recent elevated troponin during admission in December 2024, coronary artery disease with prior PCI and stents, Crohn's disease, anxiety, depression  Significant Hospital Events: Including procedures,  antibiotic start and stop dates in addition to other pertinent events   2/9 Transferred ER to ER from [facility] to [place] with chief complaint of weakness, vomiting, poor p.o. intake found to have hypotension acute on chronic renal failure anion gap metabolic acidosis borderline hyperkalemia and EKG changes worrisome for possible cardiac ischemia transferred to [facility] for evaluation 2/10 Progressively more agitated, on CRRT. DNR/DNI per patient's wife.  Interim History / Subjective:  Agitated overnight requiring pain meds and antianxiety meds.  Objective:  Blood pressure (!) 110/58, pulse (!) 104, temperature 97.9 F (36.6 C), temperature source Axillary, resp. rate 15, height 6\' 2"  (1.88 m), weight 77.2 kg, SpO2 100%.        Intake/Output Summary (Last 24 hours) at 06/04/2023 1316 Last data filed at 06/04/2023 1000 Gross per 24 hour  Intake 392.75 ml  Output 510 ml  Net -117.25 ml   Filed Weights   06/02/23 1831 06/03/23 0425 06/04/23 0500  Weight: 73.7 kg 77.7 kg 77.2 kg   Physical Examination: General: Ill-appearing man lying in bed sleeping HEENT: [place]/AT, nonrebreather in place Neck: Right IJ CVC Neuro: Sleeping comfortably CV: S1-S2, tachycardic, regular rhythm PULM: Tachypnea, mildly increased effort.  No wheezing GI: Soft, nontender, nondistended Extremities: No significant lower extremity edema Skin: Warm, dry, no diffuse rashes  BUN 20 Creatinine 1.87 BNP 1109.1 Procalcitonin 0.2 WBC 11.3 H/H 7.8/23.1 Platelets 130 Blood cultures-no growth to date  Resolved Hospital Problem List:    Assessment & Plan:   Severe agitation and delirium, rather advanced baseline dementia based on wife's report.  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  She understands that his breathing is worse and he is requiring more oxygen. She does not want him to suffer and wants to transition now to comfort focused care. RN present, comfort orders placed.     Best Practice (right click and "Reselect all SmartList Selections" daily)   Diet/type: NPO w/ oral meds DVT prophylaxis systemic heparin Pressure ulcer(s): N/A GI prophylaxis: PPI Lines: N/A Foley:  Yes, and it is still needed Code Status:  full code Last date of multidisciplinary goals of care discussion [pending]  Critical care time:    This patient is critically ill with multiple organ system failure which requires frequent high complexity decision making, assessment, support, evaluation, and titration of therapies. This was completed through the application of advanced monitoring technologies and extensive interpretation of multiple databases. During this encounter critical care time was devoted to patient care services described in this note for 35 minutes.  Steffanie Dunn, DO 06/04/23 1:28 PM [facility]  For contact information, see Amion. If no response to pager, please call PCCM consult pager. After hours, 7PM- 7AM, please call Elink.

## 2023-06-04 NOTE — Progress Notes (Signed)
Notified of desaturation and bradycardic episode, now on NRB with HR 120s. Sleeping. Faint rhonchi, symmetric breath sounds, mild tachypnea.  RN previously called wife. I called to update son Reuel Boom- L/M, no answer.  Steffanie Dunn, DO 06/04/23 7:28 AM [facility]  For contact information, see Amion. If no response to pager, please call PCCM consult pager. After hours, 7PM- 7AM, please call Elink.

## 2023-06-04 NOTE — Progress Notes (Signed)
Upon repositioning patient in bed, he de-satted to the upper 60s and bradyed down to the 50s.  Patient placed on a NRB at 15L. Patient was agitated and rattling in his chest prior to repositioning; he is unable to respond to any commands. After oxygen placement, he is calm and resting in the bed with no agitation.  MD aware. Wife called called with update and told to come in when she is able. Son called by MD with update.

## 2023-06-05 DIAGNOSIS — Z515 Encounter for palliative care: Secondary | ICD-10-CM | POA: Diagnosis not present

## 2023-06-05 DIAGNOSIS — Z7189 Other specified counseling: Secondary | ICD-10-CM

## 2023-06-05 MED ORDER — FENTANYL BOLUS VIA INFUSION: 50.0000 ug | INTRAVENOUS | Status: DC | PRN

## 2023-06-05 MED ORDER — FENTANYL 2500MCG IN NS 250ML (10MCG/ML) PREMIX INFUSION
100.0000 ug/h | INTRAVENOUS | Status: DC
  Administered 2023-06-05: 100 ug/h via INTRAVENOUS
  Filled 2023-06-05: qty 250

## 2023-06-07 LAB — CULTURE, BLOOD (ROUTINE X 2)
Culture: NO GROWTH
Culture: NO GROWTH

## 2023-06-10 ENCOUNTER — Ambulatory Visit: Payer: Self-pay | Admitting: *Deleted

## 2023-06-22 NOTE — Consult Note (Signed)
Palliative Medicine Inpatient Consult Note  Consulting Provider: Steffanie Dunn, DO   Reason for consult:   Palliative Care Consult Services Palliative Medicine Consult  Reason for Consult? family alignment, goals of care (mostly with son, wife is appropriate)   05/26/2023  HPI:  Per intake H&P -->Mr. Coger is a 78 year old gentleman with a history of dementia with hallucinations, CKD 3A, diabetes with peripheral neuropathy, hypertension, GERD, CAD with stents who presented to the hospital with confusion, hypotension, hypothermia. s, and some resting shortness of breath, some lethargy, and mild encephalopathy.  Treatment initiated for sepsis - CRRT began. Patient with identified poor QOL. Palliative care to support additional GOC conversations.   Clinical Assessment/Goals of Care:  *Please note that this is a verbal dictation therefore any spelling or grammatical errors are due to the "Dragon Medical One" system interpretation.  I have reviewed medical records including EPIC notes, labs and imaging, received report from bedside RN, assessed the patient who is resting comfortable at bedside.    I met with patients daughter in law, [place] - [place] discussed everything with her spouse, Reuel Boom and patient wife, Joyce Gross to further discuss diagnosis prognosis, GOC, EOL wishes, disposition and options.   I introduced Palliative Medicine as specialized medical care for people living with serious illness. It focuses on providing relief from the symptoms and stress of a serious illness. The goal is to improve quality of life for both the patient and the family.  Medical History Review and Understanding:  A review of patient's past medical history inclusive of chronic kidney disease, type 2 diabetes, peripheral neuropathy, hypertension, coronary artery disease, PTSD, and dementia was completed.  Social History:  Jahan lives in [place].  His wife can he have been happily married for  many years now.  He has 1 son.  He is a [place] veteran.  He also worked as a TEFL teacher.  He is a man of the [place] faith.  Functional and Nutritional State:  Preceding hospitalization Ardith was living in a single-family home with his wife.  For the last 6 months or so his functional state has declined significantly to the point whereby he needed assistance with B ADLs.  His appetite has also declined.  Advance Directives:  A detailed discussion was had today regarding advanced directives.  Patient's post Joyce Gross is a Runner, broadcasting/film/video.  Code Status:  Concepts specific to code status, artifical feeding and hydration, continued IV antibiotics and rehospitalization was had.  The difference between a aggressive medical intervention path  and a palliative comfort care path for this patient at this time was had.   Jeziah is an established DO NOT RESUSCITATE DO NOT INTUBATE CODE STATUS.  Discussion:  Discussed with patient's daughter-in-law the plan at this point in time regarding patient's overall clinical state and deterioration.  Reviewed the goal of comfort as per discussion with the CCM team.  We talked about transition to comfort measures in house and what that would entail inclusive of medications to control pain, dyspnea, agitation, nausea, itching, and hiccups. We discussed stopping all uneccessary measures such as cardiac monitoring, blood draws, needle sticks, and frequent vital signs. Utilized reflective listening throughout our time together.   Discussed patient's symptom management and patient's daughter-in-law feels he would benefit from consistent medications.  We talked about initiating a low-dose fentanyl drip which daughter-in-law and patient's son are in agreement with.  Patient's daughter-in-law shares prior would be welcomed and is open to our chaplain stopping  by this morning.  Discussed the importance of continued conversation with family and their   medical providers regarding overall plan of care and treatment options, ensuring decisions are within the context of the patients values and GOCs.  Decision Maker: Johnedward Brodrick (spouse): [phone]  SUMMARY OF RECOMMENDATIONS   DNAR/DNI  Comfort care  Initiate low dose fentanyl gtt  Additional medications per [facility]  Ongoing Palliative support  Anticipate in hospital death  Code Status/Advance Care Planning: DNAR/DNI  Palliative Prophylaxis:  Aspiration, Bowel Regimen, Delirium Protocol, Frequent Pain Assessment, Oral Care, Palliative Wound Care, and Turn Reposition  Additional Recommendations (Limitations, Scope, Preferences): Comfort care  Psycho-social/Spiritual:  Desire for further Chaplaincy support: Yes Additional Recommendations: Education on end-of-life care   Prognosis: Limited hours to days  Discharge Planning: Discharge will be [facility].  Vitals:   06/04/23 1958 06/04/23 2114  BP:  (!) 67/56  Pulse:  98  Resp:  18  Temp: 97.8 F (36.6 C) 98.1 F (36.7 C)  SpO2:  90%    Intake/Output Summary (Last 24 hours) at 06/20/2023 0981 Last data filed at 06/17/2023 0500 Gross per 24 hour  Intake 45.97 ml  Output 49 ml  Net -3.03 ml   Last Weight  Most recent update: 06/07/2023  5:11 AM    Weight  77.2 kg (170 lb 3.1 oz)            Gen: Elderly Caucasian male in no acute distress HEENT: Dry mucous membranes CV: Regular rate and rhythm PULM: On room air shallow breathing ABD: soft/nontender EXT: Cool distal digits Neuro: Somnolent  PPS: 10%   This conversation/these recommendations were discussed with patient primary care team, Dr. Chestine Spore  Billing based on MDM: High ______________________________________________________ Lamarr Lulas [facility] Palliative Medicine Team Team Cell Phone: [phone] Please utilize secure chat with additional questions, if there is no response within 30 minutes please call the above phone  number  Palliative Medicine Team providers are available by phone from 7am to 7pm daily and can be reached through the team cell phone.  Should this patient require assistance outside of these hours, please call the patient's attending physician.

## 2023-06-22 NOTE — Progress Notes (Addendum)
This chaplain responded to PMT NP-Michelle consult for EOL spiritual care. The Pt. is resting comfortably at the time of the visit. The Pt. son-Daniel and daughter in law-Misty are at the bedside. The Pt. preferred name is [name].  The family accepted the chaplain's invitation for storytelling and hospitality. Hospitality was shared to give the family space to remain at the bedside. The chaplain understands the Pt. faith joins the family's goal for Pt. peace and comfort.   The family accepted the chaplain's invitation for prayer and F/U spiritual care as needed.  Chaplain Stephanie Acre [phone]

## 2023-06-22 NOTE — Death Summary Note (Signed)
DEATH SUMMARY   Patient Details  Name: [name] MRN: [id number] DOB: 1945-04-24  Admission/Discharge Information   Admit Date:  June 05, 2023  Date of Death: Date of Death: June 08, 2023  Time of Death: Time of Death: 1155  Length of Stay: 3  Referring Physician: Tommie Sams, DO   Reason(s) for Hospitalization  NSTEMI, AKI, failure to thrive  Diagnoses  Preliminary cause of death:  Secondary Diagnoses (including complications and co-morbidities):  Principal Problem:   Acute on chronic renal failure (HCC) Active Problems:   NSTEMI (non-ST elevated myocardial infarction) (HCC)   Encephalopathy   Metabolic acidosis  [name] agitation and delirium, rather advanced baseline dementia  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 year old male with several comorbidities recently hospitalized back in December 2024 for acute on chronic renal failure resides at home, presented to [facility] emergency room with report of decreased oral intake, nausea and vomiting, worsening weakness, and some resting shortness of breath, some lethargy, and mild encephalopathy.  On initial EMS arrival he was hypotensive with systolic blood pressure in the 70s to 80s on arrival to the emergency room diagnostic evaluation included CT head which was negative for acute process, CT chest abdomen and pelvis was obtained there was no acute abnormality identified he did have a 4 mm right upper lobe pulmonary  nodule COVID was negative influenza negative urinalysis with marked protein urea UDS negative sodium 143 anion gap greater than 15 creatinine 6.10, on record from his discharge in December creatinine appears to sit in the range from 2.7-2.8, HCO3 13.4 White blood cell count 11.7 Troponin I Initially 33, 6 hours spiked to 295 EKG showing concern for age unspecific anterior infarct and ST abnormality in the lateral leads He was started on IV hydration Placed on telemetry Cultures sent Empiric antibiotics administered Transferred to [facility] for definitive care critical care asked to admit.  In the ICU he had a temporary HD catheter placed and was started on CRRT for management of acidosis, hyperkalemia, and renal failure. His ACS was managed medically with heparin and pain control due to [name] renal failure. Unfortunately agitated delirium became a large part of his presentation and management, and family at bedside related that he likely did not want such aggressive care and they would want to avoid him suffering any more. He was managed medically for his symptoms and transitioned to comfort-focused care only on 07-Jun-2023. He passed away on 2023-06-08 with family at bedside.    Pertinent Labs and Studies  Significant Diagnostic Studies ECHOCARDIOGRAM LIMITED Result Date: 06/03/2023    ECHOCARDIOGRAM LIMITED REPORT   Patient Name:   [name] Date of Exam: 06/03/2023 Medical Rec #:  [id number]             Height:       74.0 in Accession #:    [id number]            Weight:       171.3 lb Date of Birth:  07/15/1945            BSA:  2.035 m Patient Age:    77 years              BP:           124/60 mmHg Patient Gender: M                     HR:           84 bpm. Exam Location:  Inpatient Procedure: Limited Echo and Limited Color Doppler Indications:    Dspnea R06.00  History:        Patient has prior history of Echocardiogram examinations, most                 recent 03/29/2023. Previous  Myocardial Infarction and CAD, CKD,                 stage 3, Signs/Symptoms:Shortness of Breath; Risk                 Factors:Hypertension, Sleep Apnea, Diabetes, Dyslipidemia and                 Current Smoker.  Sonographer:    Lucendia Herrlich RCS Referring Phys: [phone] PETER E BABCOCK  Sonographer Comments: Image acquisition challenging due to patient body habitus. IMPRESSIONS  1. Hypokinesis of the distal inferolateral wall; akinesis of the distal inferior wall; overall mild LV dysfunction.  2. Left ventricular ejection fraction, by estimation, is 45 to 50%. The left ventricle has mildly decreased function. The left ventricle demonstrates regional wall motion abnormalities (see scoring diagram/findings for description). Left ventricular diastolic parameters are consistent with Grade I diastolic dysfunction (impaired relaxation).  3. Right ventricular systolic function is normal. The right ventricular size is normal.  4. The mitral valve is normal in structure. Mild mitral valve regurgitation. No evidence of mitral stenosis.  5. The aortic valve is tricuspid. Aortic valve regurgitation is mild. Aortic valve sclerosis is present, with no evidence of aortic valve stenosis.  6. The inferior vena cava is dilated in size with >50% respiratory variability, suggesting right atrial pressure of 8 mmHg. FINDINGS  Left Ventricle: Left ventricular ejection fraction, by estimation, is 45 to 50%. The left ventricle has mildly decreased function. The left ventricle demonstrates regional wall motion abnormalities. The left ventricular internal cavity size was normal in size. There is no left ventricular hypertrophy. Left ventricular diastolic parameters are consistent with Grade I diastolic dysfunction (impaired relaxation). Right Ventricle: The right ventricular size is normal. Right ventricular systolic function is normal. Left Atrium: Left atrial size was normal in size. Right Atrium: Right atrial size was normal in size.  Pericardium: There is no evidence of pericardial effusion. Mitral Valve: The mitral valve is normal in structure. Mild mitral valve regurgitation. No evidence of mitral valve stenosis. Tricuspid Valve: The tricuspid valve is normal in structure. Tricuspid valve regurgitation is mild . No evidence of tricuspid stenosis. Aortic Valve: The aortic valve is tricuspid. Aortic valve regurgitation is mild. Aortic valve sclerosis is present, with no evidence of aortic valve stenosis. Aortic valve peak gradient measures 4.8 mmHg. Pulmonic Valve: The pulmonic valve was normal in structure. Pulmonic valve regurgitation is not visualized. No evidence of pulmonic stenosis. Aorta: The aortic root is normal in size and structure. Venous: The inferior vena cava is dilated in size with greater than 50% respiratory variability, suggesting right atrial pressure of 8 mmHg. IAS/Shunts: No atrial level shunt detected by color flow Doppler. Additional Comments: Hypokinesis of the distal inferolateral wall; akinesis of the  distal inferior wall; overall mild LV dysfunction.  LEFT VENTRICLE PLAX 2D LVIDd:         4.70 cm   Diastology LVIDs:         3.40 cm   LV e' medial:    5.93 cm/s LV PW:         0.90 cm   LV E/e' medial:  13.0 LV IVS:        0.90 cm   LV e' lateral:   15.00 cm/s LVOT diam:     2.10 cm   LV E/e' lateral: 5.1 LV SV:         36 LV SV Index:   18 LVOT Area:     3.46 cm  RIGHT VENTRICLE            IVC RV S prime:     7.88 cm/s  IVC diam: 2.20 cm TAPSE (M-mode): 1.3 cm LEFT ATRIUM         Index LA diam:    3.90 cm 1.92 cm/m  AORTIC VALVE AV Area (Vmax): 2.05 cm AV Vmax:        110.00 cm/s AV Peak Grad:   4.8 mmHg LVOT Vmax:      65.10 cm/s LVOT Vmean:     40.400 cm/s LVOT VTI:       0.104 m  AORTA Ao Root diam: 3.20 cm Ao Asc diam:  2.90 cm MITRAL VALVE               TRICUSPID VALVE MV Area (PHT): 5.02 cm    TR Peak grad:   25.6 mmHg MV Decel Time: 151 msec    TR Vmax:        253.00 cm/s MV E velocity: 77.10 cm/s MV A  velocity: 79.10 cm/s  SHUNTS MV E/A ratio:  0.97        Systemic VTI:  0.10 m                            Systemic Diam: 2.10 cm Olga Millers MD Electronically signed by Olga Millers MD Signature Date/Time: 06/03/2023/12:45:45 PM    Final    US RENAL Result Date: 06/02/2023 CLINICAL DATA:  Acute renal failure superimposed on stage IV chronic renal disease. EXAM: RENAL / URINARY TRACT ULTRASOUND COMPLETE COMPARISON:  None Available. FINDINGS: Right Kidney: Renal measurements: 9.3 cm x 4.8 cm x 6.1 cm = volume: 141.60 mL. Diffusely increased echogenicity of the renal parenchyma is noted. A 1.7 cm x 1.0 cm x 1.3 cm simple right renal cyst is seen. No hydronephrosis is visualized. Left Kidney: Renal measurements: 10.3 cm x 4.8 cm x 4.5 cm = volume: 116.7 mL. Diffusely increased echogenicity of the renal parenchyma is noted. No mass or hydronephrosis visualized. Bladder: A Foley catheter is in place. Other: None. IMPRESSION: 1. Bilateral echogenic kidneys which may represent sequelae associated with medical renal disease. 2. Simple right renal cyst. Electronically Signed   By: Aram Candela M.D.   On: 06/02/2023 20:50   DG Chest Port 1 View Result Date: 06/02/2023 CLINICAL DATA:  Central line placement EXAM: PORTABLE CHEST 1 VIEW COMPARISON:  06/02/2023, 1:48 a.m. FINDINGS: Interval placement of large-bore right neck multi lumen vascular catheter, tip near the superior cavoatrial junction. 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 Mild diffuse interstitial opacity. No acute osseous findings. IMPRESSION: 1. Interval placement of large-bore right neck multi lumen vascular catheter, tip near the superior cavoatrial junction. No  pneumothorax. 2. Cardiomegaly with mild diffuse interstitial opacity, consistent with edema. Electronically Signed   By: Jearld Lesch M.D.   On: 06/02/2023 16:48    Microbiology Recent Results (from the past 240 hours)  Culture, blood (Routine X 2) w Reflex to ID Panel      Status: None (Preliminary result)   Collection Time: 06/02/23  2:00 PM   Specimen: BLOOD RIGHT ARM  Result Value Ref Range Status   Specimen Description BLOOD RIGHT ARM  Final   Special Requests   Final    BOTTLES DRAWN AEROBIC ONLY Blood Culture results may not be optimal due to an inadequate volume of blood received in culture bottles   Culture   Final    NO GROWTH 3 DAYS Performed at [facility], [address], [place], [place] [address]    Report Status PENDING  Incomplete  Culture, blood (Routine X 2) w Reflex to ID Panel     Status: None (Preliminary result)   Collection Time: 06/02/23  2:04 PM   Specimen: BLOOD RIGHT HAND  Result Value Ref Range Status   Specimen Description BLOOD RIGHT HAND  Final   Special Requests   Final    BOTTLES DRAWN AEROBIC ONLY Blood Culture results may not be optimal due to an inadequate volume of blood received in culture bottles   Culture   Final    NO GROWTH 3 DAYS Performed at [facility], [address] [place], [place] [address]    Report Status PENDING  Incomplete  MRSA Next Gen by PCR, Nasal     Status: None   Collection Time: 06/02/23  2:04 PM   Specimen: Nasal Mucosa; Nasal Swab  Result Value Ref Range Status   MRSA by PCR Next Gen NOT DETECTED NOT DETECTED Final    Comment: (NOTE) The GeneXpert MRSA Assay (FDA approved for NASAL specimens only), is one component of a comprehensive MRSA colonization surveillance program. It is not intended to diagnose MRSA infection nor to guide or monitor treatment for MRSA infections. Test performance is not FDA approved in patients less than 78 years old. Performed at [facility], [address], [place], [place] [address]     Lab Basic Metabolic Panel: Recent Labs  Lab 06/02/23 1051 06/02/23 1116 06/02/23 2125 06/02/23 2220 06/03/23 0357 06/03/23 0556 06/03/23 1741 06/04/23 0222  NA 137  137   < > 139 139 138 138 139 136  K 5.4*  5.6*   < > 4.0 3.6 3.0* 3.0*  3.8 4.0  CL 114*  115*  --  107  --  99  --  101 102  CO2 7*  <7*  --  12*  --  19*  --  23 21*  GLUCOSE 142*  141*  --  229*  --  183*  --  133* 159*  BUN 90*  88*  --  81*  --  55*  --  29* 20  CREATININE 5.76*  5.70*  --  5.11*  --  3.96*  --  2.37* 1.87*  CALCIUM 7.7*  7.8*  --  7.3*  --  7.5*  --  7.6* 7.6*  MG 2.1  --   --   --  1.9  --   --  2.2  PHOS  --   --   --   --  4.5  --  2.8 3.0   < > = values in this interval not displayed.   Liver  Function Tests: Recent Labs  Lab 06/02/23 1051 06/03/23 0357 06/03/23 1741 06/04/23 0222  AST 39  --   --   --   ALT 25  --   --   --   ALKPHOS 79  --   --   --   BILITOT 1.1  --   --   --   PROT 6.0*  --   --   --   ALBUMIN 2.8* 2.4* 2.5* 2.6*   No results for input(s): "LIPASE", "AMYLASE" in the last 168 hours. No results for input(s): "AMMONIA" in the last 168 hours. CBC: Recent Labs  Lab 06/02/23 1051 06/02/23 1116 06/02/23 1632 06/02/23 2220 06/03/23 0357 06/03/23 0556 06/03/23 1146 06/03/23 1741 06/04/23 0222  WBC 13.6*  13.5*  --  16.9*  --  13.1*  --  15.3* 12.7* 11.3*  NEUTROABS 12.4*  --   --   --   --   --   --   --   --   HGB 8.5*  8.7*   < > 8.2*   < > 7.7* 6.8* 8.1* 7.7* 7.8*  HCT 27.6*  28.1*   < > 25.9*   < > 21.5* 20.0* 22.7* 21.5* 23.1*  MCV 102.2*  102.6*  --  100.4*  --  91.9  --  90.4 91.9 94.7  PLT 148*  146*  --  166  --  142*  --  158 126* 130*   < > = values in this interval not displayed.   Cardiac Enzymes: No results for input(s): "CKTOTAL", "CKMB", "CKMBINDEX", "TROPONINI" in the last 168 hours. Sepsis Labs: Recent Labs  Lab 06/02/23 1051 06/02/23 1117 06/02/23 1632 06/03/23 0357 06/03/23 1146 06/03/23 1741 06/04/23 0222  PROCALCITON 0.24  --   --  0.13  --   --  0.20  WBC 13.6*  13.5*  --    < > 13.1* 15.3* 12.7* 11.3*  LATICACIDVEN  --  0.7  --   --   --   --   --    < > = values in this interval not displayed.    Procedures/Operations  HD catheter  insertion.   Steffanie Dunn 06/16/2023, 3:26 PM

## 2023-06-22 DEATH — deceased

## 2023-07-03 NOTE — Patient Outreach (Signed)
 Care Coordination   Follow Up Visit Note   07/03/2023 late entry for 06/03/23 Name: DEONTREY MASSI MRN: [id number] DOB: [birth year]  Chrissie Noa Alcocer is a 78 y.o. year old male who sees Tommie Sams, DO for primary care. I spoke with  the wife of ODIE EDMONDS by phone today.  What matters to the patients health and wellness today?  End of life discussion with wife      Goals Addressed             This Visit's Progress    COMPLETED: manage hypertension, diabetes crohn's,memory MD office visits - [facility]BC[facility] care coordination       Wife reached  She reports patient not doing well and may pass soon Palliative/hospice care   Interventions Today    Flowsheet Row Most Recent Value  Chronic Disease   Chronic disease during today's visit Other  [hospitalized at [facility]]  General Interventions   General Interventions Discussed/Reviewed General Interventions Reviewed  Mental Health Interventions   Mental Health Discussed/Reviewed Mental Health Reviewed, Grief and Loss              SDOH assessments and interventions completed:  No     Care Coordination Interventions:  Yes, provided   Follow up plan: No further intervention required.   Encounter Outcome:  Patient Visit Completed   Cala Bradford L. Noelle Penner, RN, BSN, CCM [place]  [facility], [facility] RN Care Manager Direct Dial: [phone]  Fax: [phone]
